# Patient Record
Sex: Female | Born: 1935 | Race: White | Hispanic: No | Marital: Married | Smoking: Never smoker
Health system: Southern US, Community
[De-identification: ages and names within clinical notes are randomized; demographics above are authoritative.]

## PROBLEM LIST (undated history)

## (undated) DIAGNOSIS — I701 Atherosclerosis of renal artery: Secondary | ICD-10-CM

## (undated) DIAGNOSIS — I739 Peripheral vascular disease, unspecified: Secondary | ICD-10-CM

## (undated) DIAGNOSIS — I4891 Unspecified atrial fibrillation: Secondary | ICD-10-CM

## (undated) DIAGNOSIS — C50919 Malignant neoplasm of unspecified site of unspecified female breast: Secondary | ICD-10-CM

## (undated) DIAGNOSIS — I10 Essential (primary) hypertension: Secondary | ICD-10-CM

## (undated) DIAGNOSIS — I499 Cardiac arrhythmia, unspecified: Secondary | ICD-10-CM

## (undated) DIAGNOSIS — F419 Anxiety disorder, unspecified: Secondary | ICD-10-CM

## (undated) DIAGNOSIS — C210 Malignant neoplasm of anus, unspecified: Secondary | ICD-10-CM

## (undated) DIAGNOSIS — M1990 Unspecified osteoarthritis, unspecified site: Secondary | ICD-10-CM

## (undated) DIAGNOSIS — E785 Hyperlipidemia, unspecified: Secondary | ICD-10-CM

## (undated) DIAGNOSIS — I639 Cerebral infarction, unspecified: Secondary | ICD-10-CM

## (undated) DIAGNOSIS — F329 Major depressive disorder, single episode, unspecified: Secondary | ICD-10-CM

## (undated) DIAGNOSIS — F32A Depression, unspecified: Secondary | ICD-10-CM

## (undated) HISTORY — DX: Atherosclerosis of renal artery: I70.1

## (undated) HISTORY — DX: Peripheral vascular disease, unspecified: I73.9

## (undated) HISTORY — DX: Hyperlipidemia, unspecified: E78.5

## (undated) HISTORY — PX: TUBAL LIGATION: SHX77

## (undated) HISTORY — PX: OVARIAN CYST REMOVAL: SHX89

## (undated) HISTORY — PX: GYNECOLOGIC CRYOSURGERY: SHX857

---

## 1987-07-10 HISTORY — PX: BREAST LUMPECTOMY: SHX2

## 1997-11-07 ENCOUNTER — Emergency Department (HOSPITAL_COMMUNITY): Admission: EM | Admit: 1997-11-07 | Discharge: 1997-11-07 | Payer: Self-pay | Admitting: Emergency Medicine

## 1997-11-10 ENCOUNTER — Ambulatory Visit (HOSPITAL_COMMUNITY): Admission: RE | Admit: 1997-11-10 | Discharge: 1997-11-10 | Payer: Self-pay | Admitting: Cardiology

## 1998-07-26 ENCOUNTER — Encounter: Payer: Self-pay | Admitting: Obstetrics & Gynecology

## 1998-07-26 ENCOUNTER — Ambulatory Visit (HOSPITAL_COMMUNITY): Admission: RE | Admit: 1998-07-26 | Discharge: 1998-07-26 | Payer: Self-pay | Admitting: Obstetrics & Gynecology

## 1998-08-09 ENCOUNTER — Ambulatory Visit (HOSPITAL_COMMUNITY): Admission: RE | Admit: 1998-08-09 | Discharge: 1998-08-09 | Payer: Self-pay | Admitting: Gastroenterology

## 1998-08-23 ENCOUNTER — Observation Stay (HOSPITAL_COMMUNITY): Admission: RE | Admit: 1998-08-23 | Discharge: 1998-08-24 | Payer: Self-pay | Admitting: Obstetrics & Gynecology

## 1999-07-27 ENCOUNTER — Encounter: Payer: Self-pay | Admitting: Obstetrics & Gynecology

## 1999-07-28 ENCOUNTER — Ambulatory Visit (HOSPITAL_COMMUNITY): Admission: RE | Admit: 1999-07-28 | Discharge: 1999-07-28 | Payer: Self-pay | Admitting: Obstetrics & Gynecology

## 1999-10-11 ENCOUNTER — Encounter: Payer: Self-pay | Admitting: Neurology

## 1999-10-11 ENCOUNTER — Ambulatory Visit (HOSPITAL_COMMUNITY): Admission: RE | Admit: 1999-10-11 | Discharge: 1999-10-11 | Payer: Self-pay | Admitting: Neurology

## 1999-10-31 ENCOUNTER — Encounter: Payer: Self-pay | Admitting: *Deleted

## 1999-10-31 ENCOUNTER — Ambulatory Visit (HOSPITAL_COMMUNITY): Admission: RE | Admit: 1999-10-31 | Discharge: 1999-10-31 | Payer: Self-pay | Admitting: *Deleted

## 1999-11-02 ENCOUNTER — Other Ambulatory Visit: Admission: RE | Admit: 1999-11-02 | Discharge: 1999-11-02 | Payer: Self-pay | Admitting: Obstetrics & Gynecology

## 1999-11-13 ENCOUNTER — Encounter: Admission: RE | Admit: 1999-11-13 | Discharge: 1999-12-11 | Payer: Self-pay | Admitting: *Deleted

## 2000-08-27 ENCOUNTER — Encounter: Payer: Self-pay | Admitting: Obstetrics & Gynecology

## 2000-08-27 ENCOUNTER — Ambulatory Visit (HOSPITAL_COMMUNITY): Admission: RE | Admit: 2000-08-27 | Discharge: 2000-08-27 | Payer: Self-pay | Admitting: Obstetrics & Gynecology

## 2000-10-23 ENCOUNTER — Other Ambulatory Visit: Admission: RE | Admit: 2000-10-23 | Discharge: 2000-10-23 | Payer: Self-pay | Admitting: Obstetrics & Gynecology

## 2001-02-15 ENCOUNTER — Emergency Department (HOSPITAL_COMMUNITY): Admission: EM | Admit: 2001-02-15 | Discharge: 2001-02-15 | Payer: Self-pay | Admitting: Emergency Medicine

## 2001-03-16 ENCOUNTER — Encounter: Payer: Self-pay | Admitting: Neurology

## 2001-03-16 ENCOUNTER — Ambulatory Visit (HOSPITAL_COMMUNITY): Admission: RE | Admit: 2001-03-16 | Discharge: 2001-03-16 | Payer: Self-pay | Admitting: Neurology

## 2001-11-05 ENCOUNTER — Other Ambulatory Visit: Admission: RE | Admit: 2001-11-05 | Discharge: 2001-11-05 | Payer: Self-pay | Admitting: Obstetrics & Gynecology

## 2001-11-10 ENCOUNTER — Ambulatory Visit (HOSPITAL_COMMUNITY): Admission: RE | Admit: 2001-11-10 | Discharge: 2001-11-10 | Payer: Self-pay | Admitting: Obstetrics & Gynecology

## 2001-11-10 ENCOUNTER — Encounter: Payer: Self-pay | Admitting: Obstetrics & Gynecology

## 2002-10-07 ENCOUNTER — Encounter: Payer: Self-pay | Admitting: Internal Medicine

## 2002-10-07 ENCOUNTER — Encounter: Admission: RE | Admit: 2002-10-07 | Discharge: 2002-10-07 | Payer: Self-pay | Admitting: Internal Medicine

## 2002-12-03 ENCOUNTER — Other Ambulatory Visit: Admission: RE | Admit: 2002-12-03 | Discharge: 2002-12-03 | Payer: Self-pay | Admitting: Obstetrics & Gynecology

## 2003-01-07 ENCOUNTER — Encounter: Payer: Self-pay | Admitting: Obstetrics & Gynecology

## 2003-01-07 ENCOUNTER — Ambulatory Visit (HOSPITAL_COMMUNITY): Admission: RE | Admit: 2003-01-07 | Discharge: 2003-01-07 | Payer: Self-pay | Admitting: Obstetrics & Gynecology

## 2003-01-14 ENCOUNTER — Encounter (INDEPENDENT_AMBULATORY_CARE_PROVIDER_SITE_OTHER): Payer: Self-pay | Admitting: Specialist

## 2003-01-14 ENCOUNTER — Encounter: Payer: Self-pay | Admitting: Obstetrics & Gynecology

## 2003-01-14 ENCOUNTER — Encounter: Admission: RE | Admit: 2003-01-14 | Discharge: 2003-01-14 | Payer: Self-pay | Admitting: Obstetrics & Gynecology

## 2003-01-14 HISTORY — PX: BREAST BIOPSY: SHX20

## 2003-03-16 ENCOUNTER — Encounter (INDEPENDENT_AMBULATORY_CARE_PROVIDER_SITE_OTHER): Payer: Self-pay

## 2003-03-16 ENCOUNTER — Ambulatory Visit (HOSPITAL_COMMUNITY): Admission: RE | Admit: 2003-03-16 | Discharge: 2003-03-16 | Payer: Self-pay | Admitting: Obstetrics & Gynecology

## 2003-05-30 ENCOUNTER — Ambulatory Visit (HOSPITAL_COMMUNITY): Admission: RE | Admit: 2003-05-30 | Discharge: 2003-05-30 | Payer: Self-pay | Admitting: Internal Medicine

## 2003-05-31 ENCOUNTER — Ambulatory Visit (HOSPITAL_COMMUNITY): Admission: RE | Admit: 2003-05-31 | Discharge: 2003-05-31 | Payer: Self-pay | Admitting: Internal Medicine

## 2003-07-26 ENCOUNTER — Inpatient Hospital Stay (HOSPITAL_COMMUNITY): Admission: EM | Admit: 2003-07-26 | Discharge: 2003-07-27 | Payer: Self-pay | Admitting: Emergency Medicine

## 2003-07-27 HISTORY — PX: CARDIOVASCULAR STRESS TEST: SHX262

## 2003-11-19 ENCOUNTER — Emergency Department (HOSPITAL_COMMUNITY): Admission: EM | Admit: 2003-11-19 | Discharge: 2003-11-20 | Payer: Self-pay | Admitting: Emergency Medicine

## 2004-01-31 ENCOUNTER — Encounter: Admission: RE | Admit: 2004-01-31 | Discharge: 2004-01-31 | Payer: Self-pay | Admitting: Obstetrics & Gynecology

## 2004-03-20 ENCOUNTER — Ambulatory Visit (HOSPITAL_COMMUNITY): Admission: RE | Admit: 2004-03-20 | Discharge: 2004-03-20 | Payer: Self-pay | Admitting: Internal Medicine

## 2004-07-27 ENCOUNTER — Emergency Department (HOSPITAL_COMMUNITY): Admission: EM | Admit: 2004-07-27 | Discharge: 2004-07-27 | Payer: Self-pay | Admitting: Family Medicine

## 2004-08-21 ENCOUNTER — Encounter (INDEPENDENT_AMBULATORY_CARE_PROVIDER_SITE_OTHER): Payer: Self-pay | Admitting: Specialist

## 2004-08-21 ENCOUNTER — Ambulatory Visit (HOSPITAL_COMMUNITY): Admission: RE | Admit: 2004-08-21 | Discharge: 2004-08-21 | Payer: Self-pay | Admitting: Gastroenterology

## 2004-10-16 ENCOUNTER — Encounter: Admission: RE | Admit: 2004-10-16 | Discharge: 2004-10-16 | Payer: Self-pay | Admitting: Occupational Medicine

## 2004-12-12 ENCOUNTER — Encounter: Admission: RE | Admit: 2004-12-12 | Discharge: 2005-01-04 | Payer: Self-pay | Admitting: Orthopedic Surgery

## 2005-01-03 ENCOUNTER — Other Ambulatory Visit: Admission: RE | Admit: 2005-01-03 | Discharge: 2005-01-03 | Payer: Self-pay | Admitting: Obstetrics & Gynecology

## 2005-02-08 ENCOUNTER — Ambulatory Visit (HOSPITAL_COMMUNITY): Admission: RE | Admit: 2005-02-08 | Discharge: 2005-02-08 | Payer: Self-pay | Admitting: Obstetrics & Gynecology

## 2005-09-20 ENCOUNTER — Ambulatory Visit (HOSPITAL_COMMUNITY): Admission: RE | Admit: 2005-09-20 | Discharge: 2005-09-20 | Payer: Self-pay | Admitting: Internal Medicine

## 2005-10-01 ENCOUNTER — Ambulatory Visit (HOSPITAL_COMMUNITY): Admission: RE | Admit: 2005-10-01 | Discharge: 2005-10-01 | Payer: Self-pay | Admitting: Internal Medicine

## 2005-11-20 ENCOUNTER — Inpatient Hospital Stay (HOSPITAL_COMMUNITY): Admission: EM | Admit: 2005-11-20 | Discharge: 2005-11-26 | Payer: Self-pay | Admitting: Emergency Medicine

## 2005-11-20 ENCOUNTER — Ambulatory Visit: Payer: Self-pay | Admitting: Internal Medicine

## 2005-11-22 ENCOUNTER — Encounter: Payer: Self-pay | Admitting: Internal Medicine

## 2005-11-24 ENCOUNTER — Encounter: Payer: Self-pay | Admitting: Cardiology

## 2005-12-04 ENCOUNTER — Encounter: Admission: RE | Admit: 2005-12-04 | Discharge: 2006-01-14 | Payer: Self-pay | Admitting: Internal Medicine

## 2006-02-22 ENCOUNTER — Encounter: Admission: RE | Admit: 2006-02-22 | Discharge: 2006-02-22 | Payer: Self-pay | Admitting: Obstetrics & Gynecology

## 2006-02-28 ENCOUNTER — Encounter: Admission: RE | Admit: 2006-02-28 | Discharge: 2006-02-28 | Payer: Self-pay | Admitting: Obstetrics & Gynecology

## 2006-03-22 HISTORY — PX: TRANSTHORACIC ECHOCARDIOGRAM: SHX275

## 2006-04-09 ENCOUNTER — Ambulatory Visit (HOSPITAL_COMMUNITY): Admission: RE | Admit: 2006-04-09 | Discharge: 2006-04-09 | Payer: Self-pay | Admitting: Internal Medicine

## 2007-02-25 ENCOUNTER — Encounter: Admission: RE | Admit: 2007-02-25 | Discharge: 2007-02-25 | Payer: Self-pay | Admitting: Obstetrics & Gynecology

## 2007-06-18 ENCOUNTER — Ambulatory Visit (HOSPITAL_COMMUNITY): Admission: RE | Admit: 2007-06-18 | Discharge: 2007-06-18 | Payer: Self-pay | Admitting: Internal Medicine

## 2008-05-18 ENCOUNTER — Encounter: Admission: RE | Admit: 2008-05-18 | Discharge: 2008-05-18 | Payer: Self-pay | Admitting: Obstetrics & Gynecology

## 2008-07-09 DIAGNOSIS — I499 Cardiac arrhythmia, unspecified: Secondary | ICD-10-CM

## 2008-07-09 HISTORY — DX: Cardiac arrhythmia, unspecified: I49.9

## 2009-03-14 ENCOUNTER — Emergency Department (HOSPITAL_COMMUNITY): Admission: EM | Admit: 2009-03-14 | Discharge: 2009-03-14 | Payer: Self-pay | Admitting: Emergency Medicine

## 2009-03-21 DIAGNOSIS — I6340 Cerebral infarction due to embolism of unspecified cerebral artery: Secondary | ICD-10-CM | POA: Insufficient documentation

## 2009-03-21 DIAGNOSIS — E785 Hyperlipidemia, unspecified: Secondary | ICD-10-CM | POA: Insufficient documentation

## 2009-03-21 DIAGNOSIS — IMO0002 Reserved for concepts with insufficient information to code with codable children: Secondary | ICD-10-CM | POA: Insufficient documentation

## 2009-03-21 DIAGNOSIS — D649 Anemia, unspecified: Secondary | ICD-10-CM | POA: Insufficient documentation

## 2009-03-21 DIAGNOSIS — R079 Chest pain, unspecified: Secondary | ICD-10-CM

## 2009-03-21 HISTORY — DX: Cerebral infarction due to embolism of unspecified cerebral artery: I63.40

## 2009-03-22 ENCOUNTER — Encounter: Payer: Self-pay | Admitting: Cardiology

## 2009-03-23 ENCOUNTER — Encounter: Payer: Self-pay | Admitting: Cardiology

## 2009-03-24 ENCOUNTER — Ambulatory Visit: Payer: Self-pay | Admitting: Cardiology

## 2009-05-24 ENCOUNTER — Encounter: Payer: Self-pay | Admitting: Cardiology

## 2009-05-25 ENCOUNTER — Ambulatory Visit: Payer: Self-pay | Admitting: Cardiology

## 2009-05-25 DIAGNOSIS — R0602 Shortness of breath: Secondary | ICD-10-CM | POA: Insufficient documentation

## 2009-06-15 ENCOUNTER — Ambulatory Visit: Payer: Self-pay | Admitting: Cardiology

## 2009-06-15 ENCOUNTER — Ambulatory Visit: Payer: Self-pay

## 2009-06-20 ENCOUNTER — Encounter: Payer: Self-pay | Admitting: Cardiology

## 2009-06-22 ENCOUNTER — Ambulatory Visit: Payer: Self-pay | Admitting: Cardiology

## 2009-06-22 DIAGNOSIS — R002 Palpitations: Secondary | ICD-10-CM | POA: Insufficient documentation

## 2009-06-22 DIAGNOSIS — R9431 Abnormal electrocardiogram [ECG] [EKG]: Secondary | ICD-10-CM | POA: Insufficient documentation

## 2009-07-14 ENCOUNTER — Telehealth (INDEPENDENT_AMBULATORY_CARE_PROVIDER_SITE_OTHER): Payer: Self-pay | Admitting: *Deleted

## 2009-07-18 ENCOUNTER — Ambulatory Visit: Payer: Self-pay | Admitting: Cardiology

## 2009-07-18 ENCOUNTER — Ambulatory Visit: Payer: Self-pay

## 2009-07-18 ENCOUNTER — Encounter (HOSPITAL_COMMUNITY): Admission: RE | Admit: 2009-07-18 | Discharge: 2009-09-12 | Payer: Self-pay | Admitting: Cardiology

## 2009-09-21 ENCOUNTER — Emergency Department (HOSPITAL_COMMUNITY): Admission: EM | Admit: 2009-09-21 | Discharge: 2009-09-21 | Payer: Self-pay | Admitting: Emergency Medicine

## 2009-09-30 ENCOUNTER — Ambulatory Visit (HOSPITAL_COMMUNITY): Admission: RE | Admit: 2009-09-30 | Discharge: 2009-09-30 | Payer: Self-pay | Admitting: Internal Medicine

## 2009-12-02 ENCOUNTER — Encounter: Admission: RE | Admit: 2009-12-02 | Discharge: 2009-12-02 | Payer: Self-pay | Admitting: Internal Medicine

## 2010-07-30 ENCOUNTER — Encounter: Payer: Self-pay | Admitting: Obstetrics & Gynecology

## 2010-07-30 ENCOUNTER — Encounter: Payer: Self-pay | Admitting: Internal Medicine

## 2010-08-10 NOTE — Assessment & Plan Note (Signed)
Summary: Cardiology Nuclear Study  Nuclear Med Background Indications for Stress Test: Evaluation for Ischemia  Indications Comments: Abnormal GXT  History: GXT, History of Chemo, Myocardial Perfusion Study  History Comments: 12/10 IHK:[id number];'87 MPS:OK per patient; h/o PAF, costochrontits  Symptoms: Chest Pain, Chest Tightness, DOE, Palpitations, SOB  Symptoms Comments: Last episode of FI:[id number] with palpitations.   Nuclear Pre-Procedure Cardiac Risk Factors: CVA, Hypertension, Lipids Caffeine/Decaff Intake: None NPO After: 8:30 PM Lungs: Clear IV 0.9% NS with Angio Cath: 20g     IV Site: (L) AC IV Started by: Stanton Kidney EMT-P Chest Size (in) 38     Cup Size D     Height (in): 64 Weight (lb): 158 BMI: 27.22 Tech Comments: Toprol held > 24hours, Per Pt.  Nuclear Med Study 1 or 2 day study:  1 day     Stress Test Type:  Stress Reading MD:  Olga Millers, MD     Referring MD:  Rollene Rotunda, MD Resting Radionuclide:  Technetium 22m Tetrofosmin     Resting Radionuclide Dose:  10.6 mCi  Stress Radionuclide:  Technetium 44m Tetrofosmin     Stress Radionuclide Dose:  33.0 mCi   Stress Protocol Exercise Time (min):  7:15 min     Max HR:  129 bpm     Predicted Max HR:  148 bpm  Max Systolic BP: 172 mm Hg     Percent Max HR:  87.16 %     METS: 8.9 Rate Pressure Product:  41660    Stress Test Technologist:  [employer]a [employer] CMA-N     Nuclear Technologist:  Burna Mortimer Deal RT-N  Rest Procedure  Myocardial perfusion imaging was performed at rest 45 minutes following the intravenous administration of Myoview Technetium 27m Tetrofosmin.  Stress Procedure  The patient exercised for 7:15.  The patient stopped due to fatigue and denied any chest pain.  There were nonspecific ST-T wave changes and occasional PAC's with a rare PVC.  Myoview was injected at peak exercise and myocardial perfusion imaging was performed after a brief delay.  QPS Raw Data Images:  Acuisition technically good;  normal left ventricular size. Stress Images:  There is normal uptake in all areas. Rest Images:  Normal homogeneous uptake in all areas of the myocardium. Subtraction (SDS):  No evidence of ischemia. Transient Ischemic Dilatation:  1.03  (Normal <1.22)  Lung/Heart Ratio:  .26  (Normal <0.45)  Quantitative Gated Spect Images QGS EDV:  61 ml QGS ESV:  12 ml QGS EF:  80 % QGS cine images:  Normal wall motion.   Overall Impression  Exercise Capacity: Fair exercise capacity. BP Response: Normal blood pressure response. Clinical Symptoms: There is dyspnea. ECG Impression: No diagnostic  ST segment change suggestive of ischemia. Overall Impression: There is no sign of scar or ischemia.  Appended Document: Cardiology Nuclear Study Normal.  No further evaluation needed.  Appended Document: Cardiology Nuclear Study left message of normal results andthat no further testing needed at this time.

## 2010-08-10 NOTE — Progress Notes (Signed)
Summary: Nuclear Pre-Procedure  Phone Note Outgoing Call Call back at [facility] Phone [phone]   Call placed by: Stanton Kidney, EMT-P,  July 14, 2009 1:30 PM Call placed to: Patient Action Taken: Phone Call Completed Summary of Call: Reviewed information on Myoview Information Sheet (see scanned document for further details).  Spoke with Patient.    Nuclear Med Background Indications for Stress Test: Evaluation for Ischemia, Stent Patency, Abnormal EKG   History: GXT, History of Chemo, Myocardial Perfusion Study  History Comments: GXT: Abnormal '04 MPS  Symptoms: Chest Pain, DOE, Palpitations    Nuclear Pre-Procedure Cardiac Risk Factors: CVA, Hypertension, Lipids Height (in): 64

## 2010-09-20 ENCOUNTER — Other Ambulatory Visit: Payer: Self-pay | Admitting: Gastroenterology

## 2010-10-13 LAB — APTT: aPTT: 37 seconds (ref 24–37)

## 2010-10-13 LAB — COMPREHENSIVE METABOLIC PANEL
ALT: 15 U/L (ref 0–35)
AST: 25 U/L (ref 0–37)
BUN: 5 mg/dL — ABNORMAL LOW (ref 6–23)
Calcium: 9.5 mg/dL (ref 8.4–10.5)
Chloride: 101 mEq/L (ref 96–112)
GFR calc Af Amer: >60 mL/min (ref >60)
GFR calc non Af Amer: >60 mL/min (ref >60)
Sodium: 141 mEq/L (ref 135–145)

## 2010-10-13 LAB — DIFFERENTIAL
Basophils Absolute: 0 10*3/uL (ref 0.0–0.1)
Basophils Relative: 0 % (ref 0–1)
Eosinophils Absolute: 0.1 10*3/uL (ref 0.0–0.7)
Eosinophils Relative: 2 % (ref 0–5)
Monocytes Absolute: 0.6 10*3/uL (ref 0.1–1.0)
Neutrophils Relative %: 71 % (ref 43–77)

## 2010-10-13 LAB — URINE CULTURE: Colony Count: >=100000

## 2010-10-13 LAB — POCT CARDIAC MARKERS
CKMB, poc: <1 ng/mL — ABNORMAL LOW (ref 1.0–8.0)
CKMB, poc: <1 ng/mL — ABNORMAL LOW (ref 1.0–8.0)
Myoglobin, poc: 71.2 ng/mL (ref 12–200)
Myoglobin, poc: 94.5 ng/mL (ref 12–200)
Troponin i, poc: <0.05 ng/mL (ref 0.00–0.09)

## 2010-10-13 LAB — URINE MICROSCOPIC-ADD ON

## 2010-10-13 LAB — CBC
HCT: 35.5 % — ABNORMAL LOW (ref 36.0–46.0)
MCHC: 32.6 g/dL (ref 30.0–36.0)
MCV: 86.1 fL (ref 78.0–100.0)
RBC: 4.12 MIL/uL (ref 3.87–5.11)
RDW: 17.4 % — ABNORMAL HIGH (ref 11.5–15.5)

## 2010-10-13 LAB — PROTIME-INR: Prothrombin Time: 24.1 seconds — ABNORMAL HIGH (ref 11.6–15.2)

## 2010-10-13 LAB — URINALYSIS, ROUTINE W REFLEX MICROSCOPIC: Ketones, ur: NEGATIVE mg/dL

## 2010-11-24 NOTE — H&P (Signed)
NAMEMALLY, GAVINA NO.:  [id number]   MEDICAL RECORD NO.:  [id number]          PATIENT TYPE:  INP   LOCATION:  1847                         FACILITY:  [facility]   PHYSICIAN:  Geoffry Paradise, M.D.  DATE OF BIRTH:  01/29/1935   DATE OF ADMISSION:  11/20/2005  DATE OF DISCHARGE:                                HISTORY & PHYSICAL   CHIEF COMPLAINT:  Headache and speech difficulties.   HISTORY OF PRESENT ILLNESS:  Ms. Clucas is a 76 year old well known to  [facility] with history that includes chronic headaches, followed by  Dr. Sandria Manly for approximately six years, hypertension, remote breast and rectal  carcinoma and a remote history of paroxysmal atrial fibrillation, presenting  at this time with intractable headache and speech difficulties.  The patient  has been in excellent health, having had a physical by Dr. Waynard Edwards within the  last 4-8 weeks.  She is followed regularly by Dr. Sandria Manly at least yearly for  her chronic headaches, maintained on Lexapro and Neurontin.  She relates  that at approximately 9:30 a.m. she had the sudden onset of a headache which  is mainly frontal and facial but subsequent development of difficulty with  word finding.  They have evidently contacted the office and was sent to the  emergency room; I was contacted at approximately 8 p.m. for further  management.  I have encouraged the EDP to also call neurology for consult  but have commanded to admit the patient for possible stroke.  She  specifically denies any nausea, vomiting, fever or difficulty with  swallowing or ambulation.  She has had no seizure activity or incontinence.  She has not been ill in any way.  In the emergency room, she remains with  the headache improved with morphine and continued difficulty with speech.  The husband says it is a bit better.  She has had no chest pain or shortness  of breath.  She is quite accustomed to in times past when atrial  fibrillation has  flared but she relates nothing in the recent year.   PAST MEDICAL HISTORY:  Patient has no known drug allergies.   MEDICATIONS:  1.  Cozaar 50 mg daily.  2.  Hydrochlorothiazide 25 mg daily.  3.  Lexapro 10 mg daily.  4.  Neurontin 300 q.a.m.  5.  Toprol XL 25 q.a.m.  6.  Ultram 50 as needed for pain.   MEDICAL ILLNESSES:  Hypertension for approximately one year, remote history  of breast carcinoma and rectal carcinoma both 20 years ago, paroxysmal  atrial fibrillation again none recently.   SURGICAL ILLNESSES:  Colonoscopy 2004, breast and rectal surgery 20 years  ago, Cardiolite study in 2004 and minor female surgeries.   SOCIAL HISTORY:  The patient is a retired Engineer, civil (consulting).  Does not smoke or drink.  Husband is at the bedside.   PHYSICAL EXAM:  VITAL SIGNS:  Temperature 98.4, blood pressure is 118/59,  pulse 58 regular, respiratory rate 18, O2 saturation 93%.  GENERAL:  The patient is actually pleasant, supine and in no distress.  Good  facial symmetry.  Skin: Warm, nondiaphoretic, good turgor.  HEENT:  Normocephalic, atraumatic individual, anicteric.  Pupils round,  reactive to light and accommodation.  Visual fields intact.  Extraocular  movements intact.  Tongue and uvula midline.  Oropharynx benign.  No facial  droop.  NECK:  No JVD or bruits.  Supple, nontender.  No meningismus.  LUNGS:  Clear bilaterally.  CARDIOVASCULAR:  Regular rate and rhythm.  No murmur, gallop, rub or heave.  No S3 or S4.  ABDOMEN:  Soft, nontender.  No hepatosplenomegaly.  BACK:  Without CVA or spinal tenderness.  EXTREMITIES:  No cyanosis, clubbing or edema.  Calves soft and nontender.  Intact distal pulses.  Warm distally.  NEUROLOGICALLY:  She has intact higher cortical functioning.  Cranial nerves  II-XII were intact.  She does have some difficulty with a mild expressive  aphasia and word-finding but comprehensive is able to follow commands quite  well.  Motor is 5/5.  Toes downgoing.  Deep  tendon reflexes symmetric.  Sensation symmetric as well.   CT:  Possible left parietal metastatic disease versus stroke unclear.  Certainly no edema or midline shift and certainly no bleed.   ASSESSMENT:  1.  New onset of atypical headache with speech difficulties consistent with      either stroke or possibly small metastasis, unclear.  No evidence of      meningitis or bleed.  2.  Remote breast carcinoma and rectal carcinoma.  3.  Essential hypertension.  4.  Paroxysmal atrial fibrillation.  No recent exacerbation by history.   PLAN:  The patient will be hydrated, treated with aspirin.  MRI first thing  in the morning and further pending this evaluation.  This will assess in  discerning as to whether this is as a result of a little stroke versus  metastatic disease.  Further to include cardiac workup or vascular workup  pending these findings.           ______________________________  Geoffry Paradise, M.D.     RA/MEDQ  D:  11/20/2005  T:  11/20/2005  Job:  [id number]

## 2010-11-24 NOTE — Discharge Summary (Signed)
NAMECARLISA, [name]               ACCOUNT NO.:  [id number]   MEDICAL RECORD NO.:  [id number]          PATIENT TYPE:  INP   LOCATION:  6704                         FACILITY:  [facility]   PHYSICIAN:  Mark A. Perini, M.D.   DATE OF BIRTH:  1935/01/20   DATE OF ADMISSION:  11/20/2005  DATE OF DISCHARGE:  11/26/2005                                 DISCHARGE SUMMARY   DISCHARGE DIAGNOSES:  1.  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  2.  Thrombosis noted in the superior portion of the inferior vena cava and      the proximal area of the right atrium which could have possibly      contributed to her stroke.  She did have a positive bubble study noted      on transesophageal echocardiogram, but this was a late positive finding      with no definite PFO noted.  3.  Hypertension controlled currently.  4.  Anemia.  5.  Sinusitis noted on MRI this admission.  6.  Vitamin B12 deficiency new diagnosis this admission.  7.  Dyslipidemia.  8.  Remote history of paroxysmal atrial fibrillation in 1999 with no      definite recurrence noted at this time.   PROCEDURES:  1.  Stroke team consultation with Dr. Pearlean Brownie.  2.  Cardiology consultation.  3.  CT scan of the head on Nov 21, 2003, which showed a wedge-shape      peripheral hypodensity high in the left parietal lobe worrisome for      acute stroke.  4.  MRI and MRA of the brain which showed a left MCA parietal branch acute      infarct.  MRA of the brain showed no significant stenosis.  Extracranial      MRA showed no significant stenosis.  5.  Speech therapy, occupational and physical therapy consultations.  6.  Transesophageal echocardiogram showed normal LV function with ejection      fraction of 60%, trivial mitral regurgitation with no other valvular      disease.  Her interatrial septum was intact with no evidence of PFO or      ASD, however, there was a mobile mass in the IVC right  atrial junction      which appeared to be a clot, bubble study was initially negative but      then turned positive after five to six cycles, suggesting a possible AVM      with a right-to-left shunt in the pulmonary vasculature.  7.  TCD bubble study on Nov 23, 2005, which showed no evidence of right-to-      left shunting.  8.  Furthermore, the patient had a CT of the chest in March of 2007 which      showed no AVM in the pulmonary vasculature.   DISCHARGE MEDICATIONS:  1.  She is to resume calcium supplementation.  2.  Neurontin 100 mg 1-3 tablets daily as before.  3.  Lexapro 10 mg daily.  4.  Tylenol as needed.  5.  She is to avoid NSAIDs  6.  Ultram as needed.  7.  Toprol XL 100 mg 1/2 tablet daily.  8.  Cozaar 100 mg 1 tablet daily.  9.  Xanax 0.5 mg 1/2 tablet daily as needed.  10. Magnesium oxide 400 mg once a day.  11. Zocor 40 mg 1/2 tablet each evening.  12. Nasacort AQ 2 sprays each nostril daily.  13. Protonix 40 mg once daily with food.  14. She is to discontinue hydrochlorothiazide unless told otherwise.  15. Aspirin 81 mg daily.  16. Coumadin 5 mg 1 tablet each evening except for 1/2 pill each Wednesday.   HISTORY OF PRESENT ILLNESS:  Jeanette [name] is a 76 year old Caucasian female who  developed sudden onset of expressive language difficulties on the morning of  admission.  She woke up with a severe headache.  She does have a history of  chronic headaches on and off the last 6 years and has been under the care of  Dr. Sandria Manly for this.  She has also seen Dr. Meryl Crutch for this as well.  She was  noted to be struggling with words and trouble expressing herself and getting  her words out, although she could understand quite well.  She did not have  any slurred speech or focal extremity weakness, numbness, gait or balance  problems.  She has had no previous history of stroke, TIA or seizures.  In  the emergency room, she was found to have an abnormality on her CT scan   consistent with a left middle cerebral artery branch infarction.  She was  therefore admitted.   HOSPITAL COURSE:  Jeanette [name] was admitted to a telemetry bed.  She was treated  with intravenous heparin per stroke protocol.  MRI and MRA of the brain were  performed with results as noted above.  The patient's symptoms gradually  improved over the upcoming days, although she still had some residual  expressive dysfunction.  She developed some mild anemia, but there is no  evidence of acute bleeding.  She was found to be B12 deficient and was given  a dose of intramuscular B12.  Other studies performed included a  transesophageal echocardiogram and transcranial Doppler studies with results  as noted above.  It was felt that she would need lifetime aspirin and  Coumadin therapy.  By Nov 26, 2005, her INR was therapeutic and she was  deemed stable for discharge home.  She had no fevers or purulent drainage  therefore there was no treatment for sinusitis given at this time.  Although  it should be noted that there were some sinusitis changes noted on her MRI  and this may have to be addressed at a later time.   DISCHARGE PHYSICAL EXAMINATION:  Temperature 98, afebrile, pulse 64,  respiratory 18, blood pressure 109/67, saturation 91-98% on room air.  She  is in no acute distress.  Lungs were clear to auscultation bilaterally with  no wheezes, rales or rhonchi.  Heart was regular rate and rhythm with no  murmur or gallop.  Abdomen was soft and nontender.  She moved extremities x4  and had grossly normal strength in both upper and lower extremities.   DISCHARGE LABORATORY DATA:  White count 5.3, hemoglobin 10.3, platelet count  257,000; there were 61% segs, 24% lymphocytes, 5% monocytes.  INR was 2.  Sodium was 141, potassium 3.7, chloride 107, CO2 29, BUN 7, creatinine 0.8,  glucose 95, albumin 3.1.  Hemoglobin A1c was 6.2.  Triglycerides  were 64, HDL 52, LDL was 103, total cholesterol was 155 with a  total cholesterol HDL  ratio of 3.2.  CEA was normal at 2.1.  B12 level was low at 181 mcg/mL. TSH  was normal at 2.954.  Urine culture showed multiple species.   DISCHARGE INSTRUCTIONS:  Jeanette [name] is to follow a low-salt, low-fat diet.  She  is to increase her activity slowly.  She is to call our office for follow-up  visit in 4 days.  At that time she will have her PT/INR rechecked.  She is  to follow-up with Dr. Pearlean Brownie and Dr. Gala Romney as instructed.  She is also to  follow-up with speech therapy to continue  with speech therapy for her expressive dysphasia.  She is to call for any  recurrent problems.  It should be noted the patient has had CT scans of the  abdomen, pelvis and chest within the last several months showing no evidence  of pulmonary problems or renal tumor as sometimes renal cell carcinoma can  relate to an IVC clot.           ______________________________  Redge Gainer Waynard Edwards, M.D.     MAP/MEDQ  D:  11/29/2005  T:  11/30/2005  Job:  [id number]   cc:   Genene Churn. Love, M.D.  Fax: [phone]   Arvilla Meres, M.D. [facility]  [employer]  [address]  [place]  [place] [address]

## 2010-11-24 NOTE — Consult Note (Signed)
NAMECHANDI, NICKLIN               ACCOUNT NO.:  [id number]   MEDICAL RECORD NO.:  [id number]          PATIENT TYPE:  INP   LOCATION:  6704                         FACILITY:  [facility]   PHYSICIAN:  Pramod P. Pearlean Brownie, MD    DATE OF BIRTH:  [birth year]   DATE OF CONSULTATION:  11/23/2005  DATE OF DISCHARGE:                                   CONSULTATION   CLINICAL HISTORY:  Ms. Adcox is a 71-hour lady who had a left middle  cerebral artery branch infarction Nov 20, 2005.  MRI showed left frontal and  parietal MCA branch infarct.  No obvious etiology has been found except  transesophageal echocardiogram has shown a small clot in the right atrium.  TEE bubble study was, however, negative.  Transcranial Doppler bubble study  would be appropriate to evaluate for small PFO or a pulmonary AV fistula as  the possible mechanism for paradoxical embolism.   Past medical history significant for paroxysmal atrial fibrillation,  headache, depression, hypertension, hyperlipidemia.   HOME MEDICATIONS:  Lexapro, Neurontin, Toprol, Cozaar, Vicodin.   ALLERGIES TO MEDICATIONS:  None known.   PAST SURGICAL HISTORY:  None.   SOCIAL HISTORY:  Married, lives with her husband.  She does not smoke or  drink.   PHYSICAL EXAMINATION:  GENERAL:  Pleasant Caucasian lady who is not in distress.  VITAL SIGNS:  She is afebrile, pulse rate is 71 and regular, respiratory  rate 16 per minute, blood pressure 142/71, temperature 97.9,  100% on room  air.  NEUROLOGICAL EXAM:  The patient is pleasant, awake, alert, cooperative.  There is no aphasia.  Pupils are regular and reactive to full range without  nystagmus.  Face with symmetric bilateral movements and normal.  Tongue is  midline.  Motor:  The patient has mild expressive language difficulties.  She has thickened speech but can name and repeat and comprehend quite well.  She has no visual field deficits on testing.  The patient states with the  left eye covered  when she looks at me from the right her vision is not quite  clear and she has trouble with some depth perception.  The left eye she is  able to see much better.  Motor system exam reveals symmetric strength.  She  had a steady gait.   Data reviewed as stated above.   IMPRESSION:  71-year lady with left middle cerebral artery branch infarction  of embolic etiology.  Transesophageal echocardiogram shows a small right  atrial clot but no definite patent foramen ovale.  Transcranial Doppler  study would be appropriate to look for a small right to left cardiac shunt  not seen on TEE or intrapulmonary shunt.   After taking verbal informed consent from the patient and explaining the  risks and benefits, transcranial Doppler bubble study was performed at the  bedside.  the left middle cerebral artery was insinuated using the head set  and continuously monitored during the study.  IV line was inserted  previously in the right forearm by the nurse.  Agitated saline injection at  rest resulted in no high intensity  transient signals.  Agitated saline  injection was followed by Valsalva maneuver x2 and both times no transient  signals were noted.   IMPRESSION:  Negative transcranial Doppler bubble study, no evidence of  right to left intracardiac or intrapulmonary shunt by today's study.  The  results were explained to the patient's family.  Angiogram of the chest had  previously been ordered, I would cancel that study since the patient did  have a CT scan of the lungs with and without contrast last month which did  not show an obvious pulmonary AV fistula.  With a negative bubble study  taking the chances of that being quite low.  The patient will stay on  heparin and start Coumadin for secondary stroke prevention.  I would  recommend follow up TEE in three months time. If the right atrial clot has  resolved, the patient may come off the Coumadin at that point. I would  recommend checking  hypercoagulable panel labs.           ______________________________  Sunny Schlein. Pearlean Brownie, MD     PPS/MEDQ  D:  11/23/2005  T:  11/24/2005  Job:  [id number]   cc:   Loraine Leriche A. Perini, M.D.  Fax: [phone]

## 2010-11-24 NOTE — Consult Note (Signed)
NAME:  BERT, GIVANS                         ACCOUNT NO.:  [id number]   MEDICAL RECORD NO.:  [id number]                   PATIENT TYPE:  INP   LOCATION:  1844                                 FACILITY:  [facility]   PHYSICIAN:  Charlton Haws, M.D.                  DATE OF BIRTH:  30-Sep-1934   DATE OF CONSULTATION:  DATE OF DISCHARGE:                                   CONSULTATION   REASON FOR CONSULTATION:  Chest pain.   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  She has a history  of what sounds like PAF.  She has never seen a cardiologist.  Apparently  this has been treated in the ER and by her primary care doctor.  She has  occasional what she thinks is trigeminy or PVCs.  She has had typical chest  pain since yesterday.   The pain is not necessarily related to exertion.  It is sharp in the  anterior chest.  She also feels some pain in her sternum.  The patient had  her pain resolved with O2 in the ER.  She is currently pain-free.  She also  complains of some diffuse burning pain.   Of note is that she has had a cervical MRI back in November, which showed no  critical stenosis; however, she has been seeing Genene Churn. Love, M.D., for  problems with headache, tingling in the back of her head and down her arms.  He thinks that this may involve a migraine equivalent.   The patient otherwise has never had a stress test and does not have  documented coronary disease.   She reports being allergic to EPINEPHRINE.  Apparently she got this in some  Novocaine at the dentist, and it caused a rapid heart rate.   The patient's medications include an aspirin a day, Neurontin, Depakote.   She has had a previous lumpectomy and left breast biopsy and a previous D&C.   There is a family history for heart failure in advanced ages.   She is married with four children.  She does not smoke or drink.   Again, her primary complaints revolve around frequent occipital headache.  She does  not have any significant shortness of breath.  There is no history  of GI problems.   PHYSICAL EXAMINATION:  VITAL SIGNS:  The blood pressure is 150/80, pulse is  71 and regular.  NECK:  Carotids normal.  CHEST:  Lungs clear.  CARDIAC:  There is an S1, S2, without murmur, gallop, rub, or click.  ABDOMEN:  Benign.  EXTREMITIES:  Lower extremities with intact pulses, no edema.   EKG shows sinus rhythm that is within normal limits.  Her enzymes are  negative.  Lab work is otherwise unremarkable.   IMPRESSION:  The patient's chest pain is atypical for coronary disease.  She  has had problems with paroxysmal atrial fibrillation in  the past.  I would  stop her heparin at this time.  Unfortunately, we cannot do a Cardiolite  this morning.  She is willing to do an exercise stress Cardiolite study in  the morning.  If this is normal, I do not think further cardiac workup is  indicated.   She will continue to follow up with Dr. Sandria Manly and her primary care doctor in  terms of her headaches and paresthesias in her head.   Her primary care doctors have continued her Neurontin.   Further recommendations will be based on the results of her Cardiolite  study.                                               Charlton Haws, M.D.    PN/MEDQ  D:  07/26/2003  T:  07/26/2003  Job:  [id number]

## 2010-11-24 NOTE — Op Note (Signed)
[name], Jeanette [name]               ACCOUNT NO.:  [id number]   MEDICAL RECORD NO.:  [id number]          PATIENT TYPE:  AMB   LOCATION:  ENDO                         FACILITY:  [facility]   PHYSICIAN:  Bernette Redbird, M.D.   DATE OF BIRTH:  [birth year]   DATE OF PROCEDURE:  08/21/2004  DATE OF DISCHARGE:                                 OPERATIVE REPORT   PROCEDURE:  Colonoscopy with biopsy.   INDICATION:  History of anal cancer and desire for colon cancer screening in  a 76 year old female with a prior history of a colonoscopy six years ago  having been negative.   FINDINGS:  Diminutive sigmoid polyps. Sigmoid diverticulosis.   PROCEDURE:  The nature, purpose, and risks of the procedure were familiar to  the patient from prior examination, she provided written consent. Sedation  was fentanyl 75 mcg and Versed 7.5 milligrams IV without arrhythmias or  desaturation. The Olympus adjustable tension pediatric video colonoscope was  advanced to the terminal ileum with the help of a little bit of external  abdominal compression to override loops. The terminal ileum looked normal  and pullback was then performed. The quality of prep was excellent and it  was felt that essentially all areas were well seen (there were some sharp  angulations of the distal colon but by going back and forth through these  areas, I doubt that any blind spots were present sufficient to obscure any  significant lesions).   In the sigmoid region there were two small polyps, one of them slightly  raised and about 2 or 3 mm across and slightly erythematous in appearance,  removed by couple of cold biopsies, and then slightly more distally, a flat  hyperplastic-appearing polyp biopsied and placed in the same jar.   There was also some sigmoid diverticulosis, mild to moderate in amount.   Examination of the rectum showed no evidence of radiation proctitis, but  retroflexion and reinspection of the rectum were  unremarkable.   It should be noted that the patient did have some mild to moderate anal  stenosis; tip of the index finger at the beginning of exam could be inserted  but not the entire finger; we used the pediatric adjustable tension scope  for this procedure it could be passed into the anal orifice without  resistance.   The patient tolerated this procedure well and there were no apparent  complications.   IMPRESSION:  1.  Diminutive sigmoid polyps removed as described above (211.3).  2.  Prior history of anal cancer.  3.  Sigmoid diverticulosis.   PLAN:  Await pathology results.      RB/MEDQ  D:  08/21/2004  T:  08/21/2004  Job:  [id number]   cc:   Loraine Leriche A. Waynard Edwards, M.D.  [address]  [place]  [place] [address]  Fax: [phone]

## 2010-11-24 NOTE — Discharge Summary (Signed)
NAME:  Jeanette [name], Jeanette [name]                         ACCOUNT NO.:  [id number]   MEDICAL RECORD NO.:  [id number]                   PATIENT TYPE:  INP   LOCATION:  3707                                 FACILITY:  [facility]   PHYSICIAN:  Mark A. Perini, M.D.                DATE OF BIRTH:  August 01, 1934   DATE OF ADMISSION:  07/25/2003  DATE OF DISCHARGE:  07/27/2003                                 DISCHARGE SUMMARY   CONSULTANTS:  Charlton Haws, M.D., cardiology consultant from [facility].   DISCHARGE DIAGNOSES:  1. Noncardiac chest pain possibly due to gastrointestinal reflux disease.  2. Mild anemia.  3. Remote history of breast cancer.  4. Remote history of anal cancer.  5. Past history of atrial fibrillation.  6. Chronic migraine headaches.  7. Diagnosis of cerebrovascular disease by an MRA study in 2002.  8. Degenerative disk disease in the cervical spine.  9. Stress urinary incontinence.  10.      Postmenopausal hormone status.   PROCEDURES:  Exercise Cardiolite study which showed no evidence of  reversible ischemia and an ejection fraction of 74%.  This was performed on  July 27, 2003.   DISCHARGE MEDICATIONS:  Jeanette [name] is to resume her previous medications,  including Activella, aspirin, and Neurontin 100 mg three to four tablets  daily.  She is to start Protonix 40 mg once daily with food for the next  four to six weeks and then this will be further assessed.   HISTORY OF PRESENT ILLNESS:  Jeanette [name] is a pleasant 76 year old female  who developed burning substernal chest pressure rated 7/10 in severity on  July 25, 2003.  The pain was fairly continuous and was associated with  shortness of breath, diaphoresis, or nausea.  She presented to the emergency  room at 2130 hours for evaluation.  The chest pain resolved on oxygen to a  great extent, although she still had some mild burning sensation in her  chest at the time of her initial exam.  Initial emergency room  workup,  including EKG and laboratory studies, were unremarkable.  She was admitted  for further evaluation.   HOSPITAL COURSE:  Jeanette [name] remained stable during her hospital course.  She had no recurrence of chest pain.  She was placed on a proton pump  inhibitor.  Cardiology was consulted and assisted with her workup.  She  underwent the stress test with the results as noted above. On July 27, 2003, she was deemed stable for discharge home.   DISCHARGE PHYSICIAN EXAMINATION:  VITAL SIGNS:  Temperature 98.2 degrees,  afebrile, pulse 57, respiratory rate 22, blood pressure 124/75, 98%  saturation on room air.  LUNGS:  Clear to auscultation bilaterally with no wheezing, rhonchi, or  rales.  HEART:  Regular rate and rhythm with no murmur, rub, or gallop.  EXTREMITIES:  There was no peripheral edema.   DISCHARGE LABORATORY  DATA:  EKG showed sinus bradycardia, but was otherwise  normal.  The white count was 6.1 with a hemoglobin of 11.1.  This was down  from 11.7 on admission.  The platelet count was normal.  Troponin I x 3 were  in normal range.  CK and CK-MB enzymes were normal on three serial sets over  the initial 24-hour stay.  MCV was normal at 88.4.  The fasting lipid  profile on July 27, 2003, showed a total cholesterol of 166,  triglycerides 81, HDL 44, total cholesterol/HDL ratio 3.8, and LDL  cholesterol 106.   ACTIVITY:  Jeanette [name] is to be up as tolerated.   DISCHARGE MEDICATIONS:  She is to use her usual pain regimen for her  recurrent and chronic headaches.   DIET:  She is to follow a low-fat, low-cholesterol diet.   FOLLOWUP:  She is to call if she has any recurrent problems.  She will  follow up in our office in two weeks for a post hospital followup.                                                Mark A. Waynard Edwards, M.D.    MAP/MEDQ  D:  07/30/2003  T:  07/31/2003  Job:  [id number]   cc:   Charlton Haws, M.D.

## 2010-11-24 NOTE — Consult Note (Signed)
[name], [name]               ACCOUNT NO.:  [id number]   MEDICAL RECORD NO.:  [id number]          PATIENT TYPE:  INP   LOCATION:  6704                         FACILITY:  [facility]   PHYSICIAN:  Pramod P. Pearlean Brownie, MD    DATE OF BIRTH:  1934-10-25   DATE OF CONSULTATION:  DATE OF DISCHARGE:                                   CONSULTATION   REASON FOR REFERRAL:  Stroke.   HISTORY OF PRESENT ILLNESS:  [name] is a 76 year old Caucasian lady  who developed sudden onset of expressive language difficulties this morning  at around 9:30 a.m.  She said she woke up with a severe headache.  She does  have history of headaches off and on for the last six years.  She has seen  Dr. Melbourne Abts as well as Dr. Meryl Crutch in the [facility] and  has been diagnosed as possible __________  headaches.  She said she has been  struggling with words, trouble expressing herself, getting words out,  though, she can understand quite well.  She denies any slurred speech, focal  extremity weakness, numbness, gait or balance problems.  There is no history  of stroke, TIA or seizures.   PAST MEDICAL HISTORY:  1.  Hypertension.  2.  Hyperlipidemia.  3.  Depression.  4.  History of paroxysmal atrial fibrillation in 2002.  She says she has had      some arrhythmias off and on since then, but has not had further atrial      fibrillation and has never been placed on Coumadin.   HOME MEDICATIONS:  1.  Neurontin.  2.  Lexapro.  3.  Toprol.  4.  Corgard.  5.  Vicodin.   ALLERGIES:  No known drug allergies.   PAST SURGICAL HISTORY:  None.   SOCIAL HISTORY:  The patient is married and lives with her husband.  She  does not smoke or drink.   REVIEW OF SYSTEMS:  Significant for headache.  No chest pain, fever, cough,  shortness of breath or diarrhea.   PHYSICAL EXAMINATION:  GENERAL APPEARANCE:  Pleasant, Caucasian lady in no  acute distress.  VITAL SIGNS:  Afebrile, pulse 78 and regular, on  admission the patient's  blood pressure 98/68, respiratory rate 18, saturation 95% on room air on 2  liters of oxygen.  HEENT:  Normocephalic, atraumatic.  NECK:  Supple without bruits.  ENT:  Unremarkable.  CARDIAC:  No murmur or gallops.  LUNGS:  Clear to auscultation.  ABDOMEN:  Soft and nontender.  NEUROLOGICAL:  The patient is awake, alert, and cooperative.  There is no  aphasia or paroxysmal dysarthria.  The patient has mild expressive aphagia  with significant word finding difficulties and non-slurring speech.  She  name, repeat and comprehend quite well.  Movements are full range without  nystagmus.  There is no visual field defect.  Face is symmetric.  Bilateral  movements are normal.  Tongue is midline.  Motor sensory exam reveals no  upper extremity drift, symmetric strength, tone, __________ sensation.  There is no finger-to-nose or knee-to-heel dysmetria.  Gait was not tested..   STUDIES:  CT scan of the head done today reveals area of wedge-shaped  hypodensities in the left parietal cortex consistent with acute infarct.  There is an ill-defined area of low density medial to this with some dense  margins which raise concern from __________ , and hence, CT scan was  repeated with contrast, but this was not enhanced and his probably an  artifact.  Admission labs are pending at this time.   IMPRESSION:  A 76 year old lady with sudden onset of expressive aphasia,  likely secondary to left middle cerebral artery branch infarction.  Etiology  to be determined, but possibly cardiogenic embolism given her previous  history of paroxysmal atrial fibrillation.   PLAN:  I would recommend IV heparin, stroke protocol.  MRI scan of the brain  with MRA of the brain, neck and Doppler studies.  Fasting lipid profile,  hemoglobin A1C and homocysteine.  Transesophageal echocardiogram, __________  cardiac thrombi, and speech therapy for language training.  Had a long  discussion with the  patient and her husband regarding her symptoms.  Discussed plan to evaluate and treatment and answered questions.   Thank you for the referral.  Will follow the patient in consult __________  precautions.           ______________________________  Sunny Schlein. Pearlean Brownie, MD     PPS/MEDQ  D:  11/20/2005  T:  11/21/2005  Job:  [id number]

## 2010-11-24 NOTE — Consult Note (Signed)
NAMECOURTENY, Jeanette [name]               ACCOUNT NO.:  [id number]   MEDICAL RECORD NO.:  [id number]          PATIENT TYPE:  INP   LOCATION:  6704                         FACILITY:  [facility]   PHYSICIAN:  Arvilla Meres, M.D. LHCDATE OF BIRTH:  10/29/1934   DATE OF CONSULTATION:  11/23/2005  DATE OF DISCHARGE:                                   CONSULTATION   PRIMARY CARE PHYSICIAN:  Mark A. Perini, M.D.   REASON FOR CONSULTATION:  To evaluate the degree of long term  anticoagulation in the setting of a recent stroke and paroxysmal atrial  fibrillation.   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  This was confirmed by MRI.  MRA of the brain showed a 50%  stenosis of the right external carotid but otherwise no significant intra or  extra cranial lesions.  Per her report, she is relatively active.  She does  have some occasional chest pain but she says that has always been attributed  to costochondritis.  She did have an exercise Myoview, in January 2005,  which showed an EF of 74% with no ischemia.   By her report, she does have a history of atrial fibrillation back in 1999,  but has not had any known recurrence.  She has not been maintained on  Coumadin.  Yesterday, I performed a TE on her as part of her evaluation for  a stroke.  It showed normal LV function with an EF of 60%.  There was  trivial mitral regurgitation without any significant other valvular disease.  Her interatrial septum was intact with no evidence of a PFO or ASD.  However, there was a mobile mass in IVC/right atrial junction which appeared  to be a clot.  Her bubble study was initially negative but then turned  positive after five to six cycles, suggestive of a possible pulmonary AVM  and right-to-left shunt.  A transcranial  Doppler was performed, by Dr.  Pearlean Brownie, today which showed no evidence of right-to-left shunting.  She did  have a chest CT, back in March, which did not show any evidence of pulmonary  AVMs.  There was also no evidence of abdominal lesions.   REVIEW OF SYSTEMS:  Is significant for frequent headaches.  She does have  occasional chest pain which as before she contributes to costochondritis.  She also has complaints of osteoarthritis.  Otherwise, all systems negative  except for HPI and problem list.   PAST MEDICAL HISTORY:  1.  Paroxysmal atrial fibrillation, one episode in 1999.  2.  Hypertension.  3.  History of breast cancer.  4.  History of rectal carcinoma.  5.  Postmenopausal state.  6.  Chronic chest pain, intermittent (possible costochondritis).      1.  An exercise Myoview, in January 2005, showed an EF of 74%, no          evidence of scar or ischemia.  7.  Gastroesophageal  reflux disease.  8.  Anemia.   CURRENT MEDICATIONS:  1.  Lexapro 10 mg a day.  2.  Toprol XL 25 a day.  3.  Cozaar 50 mg a day.  4.  Neurontin 300 mg q.h.s.  5.  Aspirin 325 a day.  6.  Protonix 40 mg b.i.d.  7.  Warfarin.   ALLERGIES TO MEDICATIONS:  NO KNOWN DRUG ALLERGIES.   SOCIAL HISTORY:  The patient is a retired Engineer, civil (consulting).  She lives with her  husband.  She is not a smoker or drink.   FAMILY HISTORY:  Father died at age 47, due to congestive heart failure.  Mother died at age 19, due to complications of Alzheimer's disease.   PHYSICAL EXAMINATION:  GENERAL:  She is well-appearing in no acute distress.  VITAL SIGNS:  Blood pressure is 140/70 with a heart rate of 70.  She is  sating 100% on room air.  HEENT:  Sclerae anicteric.  EOMI.  There is no xanthelasmas.  Mucous  membranes are moist.  NECK:  Supple.  There is no JVD.  Carotids are 2+ bilaterally without any  bruit.  There is no lymphadenopathy or thyromegaly.  CARDIAC:  Regular rate and rhythm with no murmurs, rubs, or gallops.  LUNGS:   Clear.  ABDOMEN:  Soft, nontender, nondistended, mildly obese, good bowel sounds.  No bruits or masses.  EXTREMITIES:  Warm with no cyanosis, clubbing, or edema.  DP pulses are 2+  bilaterally.  NEUROLOGIC:  She is alert and oriented x3.  She moves all four extremities  without difficulty.  Affect is appropriate.   EKG shows sinus bradycardia at a rate 53 with no significant ST-T wave  changes.   LABORATORY:  Show a total cholesterol of 155, triglycerides 44, HDL 44, and  LDL of 102.  Sodium is 141, potassium 3.7, bicarb is 29, glucose is 90, BUN  is 6, creatinine is 0.8.  Liver function tests are within normal range.  White count is 4.8, hemoglobin is 9.9, platelets are 231.   ASSESSMENT:  1.  Acute left brain cerebrovascular accident.  2.  Remote history of paroxysmal atrial fibrillation, 1999, without known      recurrence.  3.  History of chest pain, thought to be noncardiac, normal Cardiolite in      2005.  4.  Hypertension.  5.  Postmenopausal state.  6.  Question of right atrial/IVC clot on TEE.   PLAN/DISCUSSION:  1.  Given her recent CVA in the setting of a history of paroxysmal atrial      fibrillation and hypertension, I feel she needs lifelong anticoagulation      with Coumadin and do not see a need for a repeat TEE.  2.  I would also add aspirin 81 mg for primary prevention of CAD.  3.  Of note, would consider checking a 2-D echo chest wall to further      evaluate the IVC and possible repeating the      CT of her abdomen to rule out renal cell carcinoma or other causes of      IVC clot.  4.  She will obviously need aggressive risk factor management and control of      her lipids and blood pressure.   We appreciate the consult.  Please do not hesitate to call with questions.      Arvilla Meres, M.D. [facility]  Electronically Signed    DB/MEDQ  D:  11/23/2005  T:  11/24/2005  Job:  [id number] 

## 2010-11-24 NOTE — H&P (Signed)
NAME:  Jeanette [name], Jeanette [name]                         ACCOUNT NO.:  [id number]   MEDICAL RECORD NO.:  [id number]                   PATIENT TYPE:  INP   LOCATION:  1844                                 FACILITY:  [facility]   PHYSICIAN:  Barry Dienes. Eloise Harman, M.D.            DATE OF BIRTH:  July 21, 1934   DATE OF ADMISSION:  07/25/2003  DATE OF DISCHARGE:                                HISTORY & PHYSICAL   CHIEF COMPLAINT:  Chest pain.   HISTORY OF PRESENT ILLNESS:  The patient is a 76 year old white female who,  while reading at approximately 1730 hours today, developed a burning  substernal chest pressure of intensity 7/10.  The discomfort was fairly  continuous and not associated with shortness of breath, diaphoresis, or  nausea.  She took an Ultram tablet and presented to the emergency room at  approximately 2130 hours for evaluation.  The chest pain resolved on oxygen  to a great extent.  She currently has grade 3/10 diffuse burning sensation  without shortness of breath.  She has never had similar symptoms.  A  standard emergency room workup with EKG and lab tests was unremarkable and  she is being admitted for further evaluation.   PAST MEDICAL HISTORY:  1. Remote breast cancer.  2. 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  3. Paroxysmal atrial fibrillation.  4. Chronic migraine headaches.  5. Diagnosis of cerebrovascular disease by MRA, 2002.  6. Cervical spine degenerative disk disease at the C5-6 level with     protrusion to the right side and at the C6-7 level.  7. Stress urinary incontinence.   MEDICATIONS PRIOR TO ADMISSION:  1. Activella 1 tablet p.o. daily.  2. Aspirin 1 tab p.o. daily.  3. Neurontin 100 mg 3 to 4 tablets daily.  4. Depakote was discontinued approximately 5 days ago.   ALLERGIES:  EPINEPHRINE.   PAST SURGICAL HISTORY:  1. Right lumpectomy. 1988.  2. July 2004, left breast biopsy.  3. September 2004, dilatation and  curettage.  4. September 2004, ureteral sling procedure with cystoscopy.   FAMILY HISTORY:  Father died at age 54 of congestive heart failure.  Mother  died at age 104 of complications of Alzheimer's disease.   SOCIAL HISTORY:  She is married and has 4 children.  She has no history of  tobacco or alcohol abuse.  She is a semi-retired R.N.   REVIEW OF SYSTEMS:  She has frequent occipital-location headaches.  She  denies recent vision change, shortness of breath, cough, history of  gastroesophageal reflux disease, constipation, or diarrhea.   PHYSICAL EXAMINATION:  VITAL SIGNS:  Initial blood pressure was 170/105,  pulse 83; current blood pressure is 156/77, pulse of 71, respirations 17,  temperature 98.8, pulse oxygen saturation 99% on room air.  GENERAL:  In general, she is a well-nourished, well-developed white female  who is in no apparent distress.  HEENT:  Exam was within normal limits.  NECK:  Neck was supple with a full range of motion.  There was no jugular  venous distention or carotid bruits.  CHEST:  Chest was clear to auscultation.  HEART:  Heart had a regular rate and rhythm.  S1 and S2 are present without  murmur, gallop, or rub.  ABDOMEN:  Abdomen had normal bowel sounds with no hepatosplenomegaly or  tenderness.  EXTREMITIES:  Extremities were without cyanosis, clubbing, or edema.  NEUROLOGICAL:  She is alert and oriented x3, cranial nerves II-XII were  normal, sensory exam was grossly normal, motor strength was 5/5 throughout  and gait was not assessed.   LABORATORY STUDIES:  White blood cell count 6.7, hemoglobin 11, hematocrit  34.8, platelet count 366,000.  Serum sodium 141, potassium 3.8, chloride  105, BUN 12, creatinine 0.5, glucose 108.  CK less than 1.0, troponin I less  than 0.05.   EKG showed:  #1 - Normal sinus rhythm; #2 - within normal limits.   IMPRESSION AND PLAN:  1. Chest pain:  This is atypical for coronary artery disease and could be     due  to gastroesophageal reflux disease or other noncardiac etiologies.  I     plan to check serial cardiac isoenzymes to rule out the unlikely     possibility of a non-Q-wave myocardial infarction.  If her enzyme tests     are normal, she will likely have a cardiac risk stratification procedure     as an outpatient.  For now, she will be continued on aspirin with a beta     blocker, intravenous heparin, and oxygen.  2. Headaches:  These are chronic, despite low-dose Neurontin treatment.  We     will consider increasing the Neurontin dose to 300 mg twice daily.  3. History of paroxysmal atrial fibrillation:  Stable off of medications.                                                Barry Dienes Eloise Harman, M.D.    DGP/MEDQ  D:  07/26/2003  T:  07/26/2003  Job:  [id number]   cc:   Loraine Leriche A. Waynard Edwards, M.D.  [address]  [place]  [place] [address]  Fax: [phone]   W. Varney Baas, M.D.  [address] [place]  [place] [address]  Fax: [phone]   Genene Churn. Love, M.D.  [address]  [address]  [place]  [place] [address]  Fax: [phone]

## 2010-11-24 NOTE — Op Note (Signed)
NAME:  Jeanette [name], Jeanette [name]                         ACCOUNT NO.:  [id number]   MEDICAL RECORD NO.:  [id number]                   PATIENT TYPE:  AMB   LOCATION:  DAY                                  FACILITY:  [facility]   PHYSICIAN:  Freddy Finner, M.D.                DATE OF BIRTH:  Sep 10, 1934   DATE OF PROCEDURE:  03/16/2003  DATE OF DISCHARGE:                                 OPERATIVE REPORT   PREOPERATIVE DIAGNOSES:  1. Postmenopausal bleeding.  2. Hormone replacement therapy.  3. Endometrial polyp by sonohysterogram done in the office.   POSTOPERATIVE DIAGNOSES:  1. Postmenopausal bleeding.  2. Hormone replacement therapy.  3. Endometrial polyp by sonohysterogram done in the office.  4. Finding of endometrial polyp.   SURGEON:  Freddy Finner, M.D.   ANESTHESIA:  General.   ESTIMATED INTRAOPERATIVE BLOOD LOSS:  10 mL.   INTRAOPERATIVE SORBITOL DEFICIT:  50 mL.   SECONDARY ANESTHETIC:  Paracervical block using 10 mL of 1% Xylocaine with  injections at 4 and 8 o'clock in the vaginal fornices.   The patient is a 76 year old, white, married female who is on hormone  replacement therapy and had an episode of postmenopausal bleeding.  A  subsequent sonohysterogram in this office revealed an endometrial polyp.  She is now admitted for hysteroscopy and D&C.  This procedure is to be  followed by tension-free vaginal tape suspension of urethral neck by Lucrezia Starch. Earlene Plater, M.D.  Intraoperative findings could not be photographed because  the equipment was not available in the room for that procedure.  The  intraoperative finding was of an endometrial polyp, somewhat pedunculated,  attached to the anterior upper fundal surface.  The uterus itself sounded to  approximately 10 cm.   The patient was admitted on the morning of surgery.  She was given a gram of  Ancef preoperatively.  She was brought to the operating room and there  placed under general anesthesia and placed in the  dorsolithotomy position.  A Betadine prep of the abdomen, perineum, and vagina was carried out.  Sterile drapes were applied.  A bivalved speculum was introduced into the  vagina.  The cervix was grasped on the anterior lip with a single-tooth  tenaculum.  The cervix appeared to be somewhat stenotic.  Initial dilatation  was carried out using finely tipped Hanks dilators.  The uterus sounded to  approximately 10 cm.  The [facility] 12.5 degree hysteroscope was then used with  3% Sorbitol as an distending median.  Inspection of the endometrial cavity  was carried out without significant difficulty or intraoperative  complications.  The polyp was identified.  Photographs could not be taken as  noted above.  Gentle thorough curettage and exploration with polyp forceps  were carried out.  Reinspection revealed adequate removal of  the polyp and endometrial sampling, which was complete.  The procedure was  then terminated.  All of the instruments were removed.  The procedure was  turned over to Winston L. Earlene Plater, M.D., to complete the bladder suspension.                                               Freddy Finner, M.D.    WRN/MEDQ  D:  03/16/2003  T:  03/16/2003  Job:  [id number]

## 2010-11-24 NOTE — Op Note (Signed)
NAME:  Jeanette [name], Jeanette [name]                         ACCOUNT NO.:  [id number]   MEDICAL RECORD NO.:  [id number]                   PATIENT TYPE:  AMB   LOCATION:  DAY                                  FACILITY:  [facility]   PHYSICIAN:  Ronald L. Ovidio Hanger, M.D.           DATE OF BIRTH:  March 29, 1935   DATE OF PROCEDURE:  03/16/2003  DATE OF DISCHARGE:                                 OPERATIVE REPORT   PREOPERATIVE DIAGNOSIS:  Type 3 stress urinary incontinence.   POSTOPERATIVE DIAGNOSIS:  Type 3 stress urinary incontinence.   PROCEDURE:  Transvaginal tape (TVT) procedure and cystourethroscopy.   SURGEON:  Lucrezia Starch. Earlene Plater, M.D.   ANESTHESIA:  General endotracheal airway.   ESTIMATED BLOOD LOSS:  50 mL.   TUBES:  None.   COMPLICATIONS:  None.   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 She underwent a  fluid evaluation and was subsequently found on straining cystogram to have  some descensus but had beaking __________ type 3 urinary incontinence. She  subsequently underwent one probe urodynamics and was found to have a  Valsalva leak point pressure of 60 cm of water. After understanding the  risks, benefits, and alternatives, she has elected to proceed with a TVT  procedure. She is to undergo gynecologic surgery by Dr. Konrad Dolores to be  dictated separately and this was performed prior to my proceeding.   DESCRIPTION OF PROCEDURE:  The patient had undergone anesthesia, been placed  in the dorsal lithotomy position and undergone her uterine procedure by Dr.  Jennette Kettle when I was asked to enter the operating room. A 16 French Foley  catheter was passed and the bladder was drained. Proper markings were  obtained at one finger breadth superior to the midline of the pubic  symphysis and two finger breadths laterally and the vaginal mucosa was  injected with plain Xylocaine at the urethrovesical junction due  to her  epinephrine allergy. A 1 cm incision was then made with a knife up through  the mucosa, the vaginal flaps were created bilaterally to the endopelvic but  not through the endopelvic fascia. Utilizing the passing needles, needles  were perforated from above, delivered intravaginally and cystourethroscopy  with both the 12 and 7 degree  windows revealing a fully distended bladder  revealed no lesions. The bladder was smooth wall and efflux of clear urine  was noted from the normally placed ureteral orifices bilaterally. The  bladder was drained and the panendoscope was removed. The TVT tape was then  placed into position with the passing instruments but the sheath was  deployed and cystourethroscopy was performed again and again no perforation  was noted. The sheaths were then deployed after the bladder had been drained  over a hemostat. The tape appeared to completely deployed. It appeared to be  in excellent position without undue tension. The  hemostat could be passed  behind it. With a full bladder, Valsalva was performed and there was slight  leakage at high Valsalva pressures which was as we wished and there was no  undue tinting on the urethra. The bladder was drained and cystourethroscopy  was performed and again no perforations were noted. The vaginal mucosa was  closed with a  running 3-0 Vicryl suture on a UR6 needle and the tapes were cut at the skin  level and puncture holes closed with Dermabond. A vaginal pack was placed  with 1/2 inch iodoform gauze saturated with bacitracin ointment and the  patient was taken to the recovery room stable.                                               Ronald L. Ovidio Hanger, M.D.    RLD/MEDQ  D:  03/16/2003  T:  03/16/2003  Job:  [id number]

## 2010-12-08 ENCOUNTER — Other Ambulatory Visit: Payer: Self-pay | Admitting: Gastroenterology

## 2011-01-09 ENCOUNTER — Other Ambulatory Visit: Payer: Self-pay | Admitting: Obstetrics & Gynecology

## 2011-01-09 DIAGNOSIS — Z853 Personal history of malignant neoplasm of breast: Secondary | ICD-10-CM

## 2011-01-09 DIAGNOSIS — Z1231 Encounter for screening mammogram for malignant neoplasm of breast: Secondary | ICD-10-CM

## 2011-01-24 ENCOUNTER — Ambulatory Visit
Admission: RE | Admit: 2011-01-24 | Discharge: 2011-01-24 | Disposition: A | Discharge status: Home / Self Care | Payer: Medicare Other | Source: Ambulatory Visit | Attending: Obstetrics & Gynecology | Admitting: Obstetrics & Gynecology

## 2011-01-24 DIAGNOSIS — Z1231 Encounter for screening mammogram for malignant neoplasm of breast: Secondary | ICD-10-CM

## 2011-01-24 DIAGNOSIS — Z853 Personal history of malignant neoplasm of breast: Secondary | ICD-10-CM

## 2011-06-01 ENCOUNTER — Encounter: Payer: Self-pay | Admitting: *Deleted

## 2011-06-01 ENCOUNTER — Emergency Department (HOSPITAL_COMMUNITY)
Admission: EM | Admit: 2011-06-01 | Discharge: 2011-06-01 | Disposition: A | Discharge status: Home / Self Care | Payer: Medicare Other | Attending: Emergency Medicine | Admitting: Emergency Medicine

## 2011-06-01 ENCOUNTER — Emergency Department (HOSPITAL_COMMUNITY): Payer: Medicare Other

## 2011-06-01 DIAGNOSIS — Z85048 Personal history of other malignant neoplasm of rectum, rectosigmoid junction, and anus: Secondary | ICD-10-CM | POA: Insufficient documentation

## 2011-06-01 DIAGNOSIS — Z79899 Other long term (current) drug therapy: Secondary | ICD-10-CM | POA: Insufficient documentation

## 2011-06-01 DIAGNOSIS — R51 Headache: Secondary | ICD-10-CM | POA: Insufficient documentation

## 2011-06-01 DIAGNOSIS — Z7901 Long term (current) use of anticoagulants: Secondary | ICD-10-CM | POA: Insufficient documentation

## 2011-06-01 DIAGNOSIS — R110 Nausea: Secondary | ICD-10-CM | POA: Insufficient documentation

## 2011-06-01 DIAGNOSIS — F17200 Nicotine dependence, unspecified, uncomplicated: Secondary | ICD-10-CM | POA: Insufficient documentation

## 2011-06-01 DIAGNOSIS — Z8673 Personal history of transient ischemic attack (TIA), and cerebral infarction without residual deficits: Secondary | ICD-10-CM | POA: Insufficient documentation

## 2011-06-01 DIAGNOSIS — Z853 Personal history of malignant neoplasm of breast: Secondary | ICD-10-CM | POA: Insufficient documentation

## 2011-06-01 DIAGNOSIS — R42 Dizziness and giddiness: Secondary | ICD-10-CM

## 2011-06-01 DIAGNOSIS — I10 Essential (primary) hypertension: Secondary | ICD-10-CM | POA: Insufficient documentation

## 2011-06-01 DIAGNOSIS — R279 Unspecified lack of coordination: Secondary | ICD-10-CM | POA: Insufficient documentation

## 2011-06-01 HISTORY — DX: Cerebral infarction, unspecified: I63.9

## 2011-06-01 HISTORY — DX: Essential (primary) hypertension: I10

## 2011-06-01 HISTORY — DX: Malignant neoplasm of unspecified site of unspecified female breast: C50.919

## 2011-06-01 HISTORY — DX: Malignant neoplasm of anus, unspecified: C21.0

## 2011-06-01 LAB — POCT I-STAT, CHEM 8
BUN: 12 mg/dL (ref 6–23)
Chloride: 103 mEq/L (ref 96–112)
Glucose, Bld: 86 mg/dL (ref 70–99)
HCT: 37 % (ref 36.0–46.0)
Potassium: 3.6 mEq/L (ref 3.5–5.1)

## 2011-06-01 LAB — PROTIME-INR: Prothrombin Time: 26 seconds — ABNORMAL HIGH (ref 11.6–15.2)

## 2011-06-01 MED ORDER — MECLIZINE HCL 25 MG PO TABS
25.0000 mg | ORAL_TABLET | Freq: Once | ORAL | Status: AC
  Administered 2011-06-01: 25 mg via ORAL
  Filled 2011-06-01: qty 1

## 2011-06-01 MED ORDER — ONDANSETRON 8 MG PO TBDP: 8.0000 mg | ORAL_TABLET | Freq: Three times a day (TID) | ORAL | Status: AC | PRN

## 2011-06-01 NOTE — ED Notes (Signed)
PT to remain in PODA10 to await results of MRI.  Pt is not moving to CDU at this time.

## 2011-06-01 NOTE — ED Notes (Signed)
Patient has gone to mri.  Will do vitals on her return.

## 2011-06-01 NOTE — ED Notes (Signed)
Discharge instructions reviewed with pt/family.  Verbalizes understanding.  NAD noted. Pt to lobby via wheelchair.

## 2011-06-01 NOTE — ED Notes (Signed)
Patient states she prefers to sit in a chair.

## 2011-06-01 NOTE — ED Provider Notes (Signed)
History     CSN: [id number] Arrival date & time: 06/01/2011  1:05 PM   First MD Initiated Contact with Patient 06/01/11 1714      Chief Complaint  Patient presents with  . Dizziness  . Headache    (Consider location/radiation/quality/duration/timing/severity/associated sxs/prior treatment) HPI History provided by pt.   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  She woke at 2am today to use restroom and was both dizzy at ataxic.  Woke w/ the same sx this am.  Associated w/ nausea.  Took meclizine at approx 12:30pm and sx improved shortly after.  Dizziness and nausea more severe than she has had w/ BPPV in the past and does not seem to be aggravated by movement.  Denies head pain, blurred vision, confusion, dysarthria, dysphagia, weakness and paresthesias.  No head trauma.  Pt is on coumadin for atrial fibrillation.  Currently on cipro for UTI which she has taken in the past.  No other recent medication changes.   Past Medical History  Diagnosis Date  . Breast cancer   . Anal cancer   . Breast cancer   . Hypertension   . Stroke   . Cancer of breast     Past Surgical History  Procedure Date  . Tubal ligation   . Ovarian cyst removal   . Breast lumpectomy     No family history on file.  History  Substance Use Topics  . Smoking status: Current Everyday Smoker  . Smokeless tobacco: Not on file  . Alcohol Use: Yes    OB History    Grav Para Term Preterm Abortions TAB SAB Ect Mult Living                  Review of Systems  All other systems reviewed and are negative.    Allergies  Epinephrine and Lotensin  Home Medications   Current Outpatient Rx  Name Route Sig Dispense Refill  . ESTROGENS, CONJUGATED 0.625 MG/GM VA CREA Vaginal Place 1 g vaginally every other day.      Marland Kitchen ESCITALOPRAM OXALATE 10 MG PO TABS Oral Take 10 mg by mouth daily.      Marland Kitchen ESTROGENS CONJUGATED 0.625 MG PO TABS Oral Take 0.625 mg by mouth daily. Take daily for 21 days then do not take  for 7 days.     . OMEGA-3 FATTY ACIDS 1000 MG PO CAPS Oral Take 2 g by mouth every other day.      Marland Kitchen GABAPENTIN 100 MG PO CAPS Oral Take 100 mg by mouth daily.      Marland Kitchen MECLIZINE HCL 25 MG PO TABS Oral Take 25 mg by mouth daily as needed. Dizzy.      Marland Kitchen METOPROLOL SUCCINATE 200 MG PO TB24 Oral Take 200 mg by mouth daily.      Marland Kitchen OLMESARTAN MEDOXOMIL 20 MG PO TABS Oral Take 20 mg by mouth at bedtime and may repeat dose one time if needed.      Marland Kitchen TRAMADOL HCL 50 MG PO TABS Oral Take 50 mg by mouth every 6 (six) hours as needed. Maximum dose= 8 tablets per day. For pain     . VITAMIN B-12 100 MCG PO TABS Oral Take 50 mcg by mouth daily.      . WARFARIN SODIUM 5 MG PO TABS Oral Take 5 mg by mouth daily. Take half tab on mon wed and fri and whole tab rest of days.       BP 178/70  Pulse 59  Temp(Src) 97.8 F (36.6 C) (Oral)  Resp 18  SpO2 100%  Physical Exam  Nursing note and vitals reviewed. Constitutional: She is oriented to person, place, and time. She appears well-developed and well-nourished. No distress.  HENT:  Head: Normocephalic and atraumatic.  Eyes:       Normal appearance  Neck: Normal range of motion.  Cardiovascular: Normal rate, regular rhythm and intact distal pulses.   Pulmonary/Chest: Effort normal and breath sounds normal.  Musculoskeletal: Normal range of motion.  Neurological: She is alert and oriented to person, place, and time. She has normal reflexes. She displays no tremor. No cranial nerve deficit or sensory deficit. She displays a negative Romberg sign. Coordination and gait normal.       Pt reports dizziness while laying in bed. 5/5 and equal upper and lower extremity strength.  No past pointing.  No pronator drift.  No nystagmus.   Skin: Skin is warm and dry. No rash noted.  Psychiatric: She has a normal mood and affect. Her behavior is normal.    ED Course  Procedures (including critical care time)  Labs Reviewed  PROTIME-INR - Abnormal; Notable for the  following:    Prothrombin Time 26.0 (*)    INR 2.34 (*)    All other components within normal limits  POCT I-STAT, CHEM 8 - Abnormal; Notable for the following:    Calcium, Ion 1.34 (*)    All other components within normal limits  I-STAT, CHEM 8   Ct Head Wo Contrast  06/01/2011  *RADIOLOGY REPORT*  Clinical Data: Unsteady gait.  Dizziness.  Headache.  CT HEAD WITHOUT CONTRAST  Technique:  Contiguous axial images were obtained from the base of the skull through the vertex without contrast.  Comparison: 03/14/2009  Findings: The brain stem, cerebellum, cerebral peduncles, thalami, basal ganglia, basilar cisterns, and ventricular system appear unremarkable.  Incidental note is made of a cavum septi pellucidi and cavum vergae.  Remote left posterior frontal lobe encephalomalacia noted.  No intracranial hemorrhage, mass lesion, or acute infarction is identified.  IMPRESSION:  1.  No acute intracranial findings. 2.  Remote left posterior frontal lobe encephalomalacia.  Original Report Authenticated By: Dellia Cloud, M.D.     1. Vertigo       MDM  Pt w/ h/o CVA as well as BPPV presents w/ dizziness, ataxia and nausea.  Sx worse than normally experiences w/ BPPV.  No focal neuro deficits on exam; nml coordination, nml gait.  No nystagmus.  CT head neg and MRI neg for acute pathology.  Results discussed w/ pt.  Dizziness has recurred and she reports that it increases every time she moves her head.  Will give her a dose of meclizine prior to discharge; this medication resolved her sx at home this afternoon.  Advised her to f/u with Dr. Waynard Edwards early next week as well as Dr. Sandria Manly, her neurologist.  Return precautions discussed.        Otilio Miu, [place] 06/01/11 2019

## 2011-06-01 NOTE — ED Notes (Signed)
Patient started cipro on Wed for uti.  She states on yesterday she had diarrhea.  Patient has had frequent urination.  Patient states at 2am she got up to the bathroom she was stumbling,  Unable to stand up w/o help.  She states she went back to bed and when she got up her sx continued.  She states she was leaning to the left.  Patient took meclizine and her sx have decreased.  Patient has hx of cancer.  She states this episode of dizziness/unsteady gait, decreased in the past 30 min.  She continues to have nausea and some dizziness.

## 2011-06-01 NOTE — ED Notes (Signed)
Order received to move Pt to CDU to await brain MRI.  Pt made aware of orders and POC.

## 2011-06-01 NOTE — ED Provider Notes (Signed)
Medical screening examination/treatment/procedure(s) were performed by non-physician practitioner and as supervising physician I was immediately available for consultation/collaboration.  Avamarie Crossley R. Damarian Priola, MD 06/01/11 2350 

## 2011-07-09 ENCOUNTER — Other Ambulatory Visit: Payer: Self-pay | Admitting: Urology

## 2011-07-09 DIAGNOSIS — N3020 Other chronic cystitis without hematuria: Secondary | ICD-10-CM | POA: Insufficient documentation

## 2011-07-17 ENCOUNTER — Ambulatory Visit
Admission: RE | Admit: 2011-07-17 | Discharge: 2011-07-17 | Disposition: A | Discharge status: Home / Self Care | Payer: Medicare Other | Source: Ambulatory Visit | Attending: Urology | Admitting: Urology

## 2011-07-17 DIAGNOSIS — N3020 Other chronic cystitis without hematuria: Secondary | ICD-10-CM

## 2012-08-01 ENCOUNTER — Other Ambulatory Visit (HOSPITAL_COMMUNITY): Payer: Self-pay | Admitting: Internal Medicine

## 2012-08-08 ENCOUNTER — Encounter (HOSPITAL_COMMUNITY): Payer: Medicare Other

## 2012-08-11 ENCOUNTER — Other Ambulatory Visit (HOSPITAL_COMMUNITY): Payer: Self-pay | Admitting: Internal Medicine

## 2012-08-11 DIAGNOSIS — I701 Atherosclerosis of renal artery: Secondary | ICD-10-CM

## 2012-09-09 ENCOUNTER — Ambulatory Visit (HOSPITAL_COMMUNITY)
Admission: RE | Admit: 2012-09-09 | Discharge: 2012-09-09 | Disposition: A | Discharge status: Home / Self Care | Payer: Medicare Other | Source: Ambulatory Visit | Attending: Cardiovascular Disease | Admitting: Cardiovascular Disease

## 2012-09-09 ENCOUNTER — Encounter (HOSPITAL_COMMUNITY): Payer: Medicare Other

## 2012-09-09 DIAGNOSIS — I701 Atherosclerosis of renal artery: Secondary | ICD-10-CM

## 2012-09-09 HISTORY — PX: OTHER SURGICAL HISTORY: SHX169

## 2012-09-09 NOTE — Progress Notes (Signed)
Renal Duplex Completed. Sturdivant, Rita D  

## 2012-09-09 NOTE — Progress Notes (Signed)
Aorta Mesenteric Duplex Completed. Chauncy Lean

## 2012-11-25 ENCOUNTER — Telehealth: Payer: Self-pay | Admitting: Cardiovascular Disease

## 2013-01-08 ENCOUNTER — Telehealth: Payer: Self-pay | Admitting: *Deleted

## 2013-01-08 DIAGNOSIS — I701 Atherosclerosis of renal artery: Secondary | ICD-10-CM

## 2013-01-08 NOTE — Telephone Encounter (Signed)
Routine renal doppler ordered

## 2013-02-25 ENCOUNTER — Ambulatory Visit: Payer: Medicare Other | Admitting: Cardiovascular Disease

## 2013-03-16 ENCOUNTER — Other Ambulatory Visit: Payer: Self-pay | Admitting: Urology

## 2013-03-16 DIAGNOSIS — R3989 Other symptoms and signs involving the genitourinary system: Secondary | ICD-10-CM

## 2013-03-18 ENCOUNTER — Other Ambulatory Visit: Payer: Self-pay | Admitting: Urology

## 2013-03-18 DIAGNOSIS — R3989 Other symptoms and signs involving the genitourinary system: Secondary | ICD-10-CM

## 2013-03-26 ENCOUNTER — Ambulatory Visit
Admission: RE | Admit: 2013-03-26 | Discharge: 2013-03-26 | Disposition: A | Discharge status: Home / Self Care | Payer: Medicare Other | Source: Ambulatory Visit | Attending: Urology | Admitting: Urology

## 2013-03-26 ENCOUNTER — Other Ambulatory Visit: Payer: Medicare Other

## 2013-03-26 DIAGNOSIS — R3989 Other symptoms and signs involving the genitourinary system: Secondary | ICD-10-CM

## 2013-03-26 MED ORDER — IOHEXOL 300 MG/ML  SOLN
100.0000 mL | Freq: Once | INTRAMUSCULAR | Status: AC | PRN
  Administered 2013-03-26: 100 mL via INTRAVENOUS

## 2013-09-24 ENCOUNTER — Telehealth: Payer: Self-pay | Admitting: Cardiovascular Disease

## 2013-09-24 DIAGNOSIS — I701 Atherosclerosis of renal artery: Secondary | ICD-10-CM

## 2013-09-24 NOTE — Telephone Encounter (Signed)
Pt got letter that she needs to come in and see Dr. Gwenlyn Found in April.  She wants to know if she needs a renal doppler before her appt.

## 2013-09-24 NOTE — Telephone Encounter (Signed)
Will have to ask Theodoro Kos And notify patient

## 2013-09-25 NOTE — Telephone Encounter (Signed)
Before speaking to the patient, RN spoke to Dr Gwenlyn Found 's nurse Curt Bears. Confirmed doppler is needed prior to office visit . Called and spoke to patient , RN informed patient  That she will needed doppler prior to appointment with Dr Gwenlyn Found and she needs an appointment with Dr Gwenlyn Found as well.  Scheduler will give her a call about the schedule time.

## 2013-10-02 ENCOUNTER — Ambulatory Visit (HOSPITAL_COMMUNITY)
Admission: RE | Admit: 2013-10-02 | Discharge: 2013-10-02 | Disposition: A | Discharge status: Home / Self Care | Payer: Medicare Other | Source: Ambulatory Visit | Attending: Internal Medicine | Admitting: Internal Medicine

## 2013-10-02 DIAGNOSIS — I701 Atherosclerosis of renal artery: Secondary | ICD-10-CM | POA: Insufficient documentation

## 2013-10-02 NOTE — Progress Notes (Signed)
Renal Artery Duplex Completed. °Brianna L Mazza,RVT °

## 2013-10-06 ENCOUNTER — Encounter: Payer: Self-pay | Admitting: *Deleted

## 2013-10-06 ENCOUNTER — Telehealth: Payer: Self-pay | Admitting: *Deleted

## 2013-10-06 DIAGNOSIS — I701 Atherosclerosis of renal artery: Secondary | ICD-10-CM

## 2013-10-06 NOTE — Telephone Encounter (Signed)
Order placed for repeat renal dopplers in 1 year  

## 2013-10-06 NOTE — Telephone Encounter (Signed)
Message copied by Chauncy Lean on Tue Oct 06, 2013 12:55 PM ------      Message from: Lorretta Harp      Created: Tue Oct 06, 2013  6:59 AM       No change from prior study. Repeat in 12 months. ------

## 2013-10-21 ENCOUNTER — Ambulatory Visit (INDEPENDENT_AMBULATORY_CARE_PROVIDER_SITE_OTHER): Payer: Medicare Other | Admitting: Cardiovascular Disease

## 2013-10-21 ENCOUNTER — Encounter: Payer: Self-pay | Admitting: Cardiovascular Disease

## 2013-10-21 VITALS — BP 110/61 | HR 63 | Ht 64.5 in | Wt 159.0 lb

## 2013-10-21 DIAGNOSIS — I10 Essential (primary) hypertension: Secondary | ICD-10-CM

## 2013-10-21 DIAGNOSIS — E785 Hyperlipidemia, unspecified: Secondary | ICD-10-CM

## 2013-10-21 DIAGNOSIS — I701 Atherosclerosis of renal artery: Secondary | ICD-10-CM

## 2013-10-21 NOTE — Assessment & Plan Note (Signed)
Serial annual renal artery Dopplers performed as recently 10/02/13 revealed moderate right renal artery stenosis with normal left renal artery velocity is unchanged from her prior Doppler a year ago although her right renal pole-to-pole dimension has decreased about a 1 cm. We will continue to follow

## 2013-10-21 NOTE — Patient Instructions (Signed)
Your physician wants you to follow-up in: 1 year with Dr Berry. You will receive a reminder letter in the mail two months in advance. If you don't receive a letter, please call our office to schedule the follow-up appointment.  

## 2013-10-21 NOTE — Assessment & Plan Note (Signed)
Not on statin therapy because she statin intolerant

## 2013-10-21 NOTE — Assessment & Plan Note (Signed)
Controlled on current medications 

## 2013-10-21 NOTE — Progress Notes (Signed)
10/21/2013 Jeanette [name]   10-19-1934  [id number]  Primary Physician Jerlyn Ly, MD Primary Cardiologist: Lorretta Harp MD Renae Gloss   HPI:  The patient is a very pleasant 79 year old mildly overweight married Caucasian female, mother of 1 biologic and 3 stepchildren, grandmother to 3 grandchildren, referred to me by Dr. Joylene Draft for peripheral vascular evaluation because of renal artery stenosis.  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 Risk factors include hypertension for the last 15 years, medically treated, and hyperlipidemia. She has never smoked. There is no family history for heart disease. She denies chest pain or shortness of breath. She has had atrial fibrillation in the past and has had a stroke from this, on Coumadin anticoagulation. Past history otherwise is remarkable for treated anal and breast cancer many years ago. She has had renal Dopplers 7 years ago that showed mild right renal artery stenosis, and followup Dopplers performed last month that showed mild progression of the right renal aortic ratio 62f 4.41.  Current Outpatient Prescriptions  Medication Sig Dispense Refill  . amLODipine (NORVASC) 10 MG tablet Take 10 mg by mouth daily.      Marland Kitchen conjugated estrogens (PREMARIN) vaginal cream Place 1 g vaginally every other day.        . escitalopram (LEXAPRO) 10 MG tablet Take 10 mg by mouth daily.        Marland Kitchen gabapentin (NEURONTIN) 100 MG capsule Take 100 mg by mouth daily.        . meclizine (ANTIVERT) 25 MG tablet Take 25 mg by mouth daily as needed. Dizzy.        . metoprolol (TOPROL-XL) 200 MG 24 hr tablet Take 200 mg by mouth daily.        Marland Kitchen olmesartan (BENICAR) 20 MG tablet Take 10 mg by mouth 2 (two) times daily.       . vitamin B-12 (CYANOCOBALAMIN) 100 MCG tablet Take 50 mcg by mouth daily.        Marland Kitchen warfarin (COUMADIN) 5 MG tablet Take 5 mg by mouth daily. Take half tab on mon wed and fri and whole tab rest of days.        No current  facility-administered medications for this visit.    Allergies  Allergen Reactions  . Benazepril Hcl     cough  . Epinephrine     Shortness of breath and increased heart rate  . Lisinopril     Cough  . Vytorin [Ezetimibe-Simvastatin]     History   Social History  . Marital Status: Married    Spouse Name: N/A    Number of Children: N/A  . Years of Education: N/A   Occupational History  . Not on file.   Social History Main Topics  . Smoking status: Current Every Day Smoker  . Smokeless tobacco: Not on file  . Alcohol Use: Yes  . Drug Use: No  . Sexual Activity:    Other Topics Concern  . Not on file   Social History Narrative  . No narrative on file     Review of Systems: General: negative for chills, fever, night sweats or weight changes.  Cardiovascular: negative for chest pain, dyspnea on exertion, edema, orthopnea, palpitations, paroxysmal nocturnal dyspnea or shortness of breath Dermatological: negative for rash Respiratory: negative for cough or wheezing Urologic: negative for hematuria Abdominal: negative for nausea, vomiting, diarrhea, bright red blood per rectum, melena, or hematemesis Neurologic: negative for visual changes, syncope, or dizziness All other  systems reviewed and are otherwise negative except as noted above.    Blood pressure 110/61, pulse 63, height 5' 4.5" (1.638 m), weight 159 lb (72.122 kg).  General appearance: alert and no distress Neck: no adenopathy, no carotid bruit, no JVD, supple, symmetrical, trachea midline and thyroid not enlarged, symmetric, no tenderness/mass/nodules Lungs: clear to auscultation bilaterally Heart: regular rate and rhythm, S1, S2 normal, no murmur, click, rub or gallop Extremities: extremities normal, atraumatic, no cyanosis or edema  EKG not performed today  ASSESSMENT AND PLAN:   DYSLIPIDEMIA Not on statin therapy because she statin intolerant  Renal artery stenosis Serial annual renal artery  Dopplers performed as recently 10/02/13 revealed moderate right renal artery stenosis with normal left renal artery velocity is unchanged from her prior Doppler a year ago although her right renal pole-to-pole dimension has decreased about a 1 cm. We will continue to follow  Essential hypertension Controlled on current medications      Lorretta Harp MD [facility], [facility] 10/21/2013 4:39 PM

## 2013-10-29 NOTE — Telephone Encounter (Signed)
Closed Encounter  °

## 2014-09-02 ENCOUNTER — Telehealth (HOSPITAL_COMMUNITY): Payer: Self-pay | Admitting: *Deleted

## 2015-04-22 ENCOUNTER — Other Ambulatory Visit: Payer: Self-pay | Admitting: Cardiovascular Disease

## 2015-04-22 DIAGNOSIS — I701 Atherosclerosis of renal artery: Secondary | ICD-10-CM

## 2015-04-28 ENCOUNTER — Ambulatory Visit (HOSPITAL_COMMUNITY)
Admission: RE | Admit: 2015-04-28 | Discharge: 2015-04-28 | Disposition: A | Discharge status: Home / Self Care | Payer: Medicare Other | Source: Ambulatory Visit | Attending: Cardiovascular Disease | Admitting: Cardiovascular Disease

## 2015-04-28 DIAGNOSIS — I10 Essential (primary) hypertension: Secondary | ICD-10-CM | POA: Insufficient documentation

## 2015-04-28 DIAGNOSIS — F17200 Nicotine dependence, unspecified, uncomplicated: Secondary | ICD-10-CM | POA: Insufficient documentation

## 2015-04-28 DIAGNOSIS — I701 Atherosclerosis of renal artery: Secondary | ICD-10-CM | POA: Diagnosis not present

## 2015-04-28 DIAGNOSIS — E785 Hyperlipidemia, unspecified: Secondary | ICD-10-CM | POA: Diagnosis not present

## 2016-08-27 ENCOUNTER — Encounter (HOSPITAL_COMMUNITY): Payer: Self-pay | Admitting: *Deleted

## 2016-08-27 ENCOUNTER — Emergency Department (HOSPITAL_COMMUNITY)
Admission: EM | Admit: 2016-08-27 | Discharge: 2016-08-27 | Disposition: A | Discharge status: Home / Self Care | Payer: Medicare Other | Attending: Emergency Medicine | Admitting: Emergency Medicine

## 2016-08-27 ENCOUNTER — Emergency Department (HOSPITAL_COMMUNITY): Payer: Medicare Other

## 2016-08-27 DIAGNOSIS — Z85048 Personal history of other malignant neoplasm of rectum, rectosigmoid junction, and anus: Secondary | ICD-10-CM | POA: Diagnosis not present

## 2016-08-27 DIAGNOSIS — Z8673 Personal history of transient ischemic attack (TIA), and cerebral infarction without residual deficits: Secondary | ICD-10-CM | POA: Insufficient documentation

## 2016-08-27 DIAGNOSIS — H539 Unspecified visual disturbance: Secondary | ICD-10-CM | POA: Insufficient documentation

## 2016-08-27 DIAGNOSIS — Z7901 Long term (current) use of anticoagulants: Secondary | ICD-10-CM | POA: Diagnosis not present

## 2016-08-27 DIAGNOSIS — I10 Essential (primary) hypertension: Secondary | ICD-10-CM | POA: Diagnosis not present

## 2016-08-27 DIAGNOSIS — Z853 Personal history of malignant neoplasm of breast: Secondary | ICD-10-CM | POA: Diagnosis not present

## 2016-08-27 DIAGNOSIS — Z79899 Other long term (current) drug therapy: Secondary | ICD-10-CM | POA: Diagnosis not present

## 2016-08-27 LAB — COMPREHENSIVE METABOLIC PANEL
ALBUMIN: 3.7 g/dL (ref 3.5–5.0)
ALT: 12 U/L — ABNORMAL LOW (ref 14–54)
ANION GAP: 8 (ref 5–15)
AST: 22 U/L (ref 15–41)
Alkaline Phosphatase: 49 U/L (ref 38–126)
BILIRUBIN TOTAL: 0.6 mg/dL (ref 0.3–1.2)
BUN: 7 mg/dL (ref 6–20)
CO2: 26 mmol/L (ref 22–32)
Calcium: 10.2 mg/dL (ref 8.9–10.3)
Chloride: 103 mmol/L (ref 101–111)
Creatinine, Ser: 0.85 mg/dL (ref 0.44–1.00)
GFR calc Af Amer: >60 mL/min (ref >60)
GFR calc non Af Amer: >60 mL/min (ref >60)
GLUCOSE: 87 mg/dL (ref 65–99)
POTASSIUM: 4.1 mmol/L (ref 3.5–5.1)
Sodium: 137 mmol/L (ref 135–145)
TOTAL PROTEIN: 6.6 g/dL (ref 6.5–8.1)

## 2016-08-27 LAB — CBC WITH DIFFERENTIAL/PLATELET
BASOS ABS: 0 10*3/uL (ref 0.0–0.1)
Basophils Relative: 0 %
Eosinophils Absolute: 0.1 10*3/uL (ref 0.0–0.7)
Eosinophils Relative: 3 %
HEMATOCRIT: 35.8 % — AB (ref 36.0–46.0)
Hemoglobin: 11.3 g/dL — ABNORMAL LOW (ref 12.0–15.0)
LYMPHS PCT: 29 %
Lymphs Abs: 1.1 10*3/uL (ref 0.7–4.0)
MCH: 28.9 pg (ref 26.0–34.0)
MCHC: 31.6 g/dL (ref 30.0–36.0)
MCV: 91.6 fL (ref 78.0–100.0)
Monocytes Absolute: 0.8 10*3/uL (ref 0.1–1.0)
Monocytes Relative: 21 %
NEUTROS ABS: 1.9 10*3/uL (ref 1.7–7.7)
Neutrophils Relative %: 47 %
Platelets: 244 10*3/uL (ref 150–400)
RBC: 3.91 MIL/uL (ref 3.87–5.11)
RDW: 15.5 % (ref 11.5–15.5)
WBC: 4 10*3/uL (ref 4.0–10.5)

## 2016-08-27 LAB — URINALYSIS, ROUTINE W REFLEX MICROSCOPIC
Bilirubin Urine: NEGATIVE
Glucose, UA: NEGATIVE mg/dL
Hgb urine dipstick: NEGATIVE
KETONES UR: NEGATIVE mg/dL
Nitrite: NEGATIVE
PROTEIN: NEGATIVE mg/dL
Specific Gravity, Urine: 1.003 — ABNORMAL LOW (ref 1.005–1.030)
pH: 7 (ref 5.0–8.0)

## 2016-08-27 LAB — APTT: APTT: 38 s — AB (ref 24–36)

## 2016-08-27 LAB — PROTIME-INR
INR: 2.04
Prothrombin Time: 23.4 seconds — ABNORMAL HIGH (ref 11.4–15.2)

## 2016-08-27 MED ORDER — FLUORESCEIN SODIUM 0.6 MG OP STRP
1.0000 | ORAL_STRIP | Freq: Once | OPHTHALMIC | Status: AC
  Administered 2016-08-27: 1 via OPHTHALMIC
  Filled 2016-08-27: qty 1

## 2016-08-27 MED ORDER — TETRACAINE HCL 0.5 % OP SOLN
2.0000 [drp] | Freq: Once | OPHTHALMIC | Status: AC
  Administered 2016-08-27: 2 [drp] via OPHTHALMIC
  Filled 2016-08-27: qty 2

## 2016-08-27 NOTE — ED Triage Notes (Signed)
Pt states hx of migraines, but has not had a migraine for 5 years.  States began having L sided headache Fri and Sat.  On Fri she also began seeing "floaters" in her L eye. Today she began having tingling in her tongue.

## 2016-08-27 NOTE — Discharge Instructions (Signed)
You have been seen today for visual disturbances. There were no acute abnormalities noted on the CT scan of the head or in the lab results, other than some bacteria noted in the urine. A urine culture will be performed and abnormal results will be called to you, if applicable.  Continued evaluation and management of your visual disturbances should be done by your ophthalmologist.  You have been scheduled for an appointment at Dr. Prudencio Burly' office tomorrow morning (2/20) at 9:30AM. Your eyes will be dilated during this visit so please plan to have someone give you a ride home.   Return to the ED should additional symptoms develop such as weakness on one side of the body, changes in mental status, visual loss, or any other concerning symptoms.

## 2016-08-27 NOTE — ED Provider Notes (Signed)
Medical screening examination/treatment/procedure(s) were conducted as a shared visit with non-physician practitioner(s) and myself.  I personally evaluated the patient during the encounter.  82 year old female here with left-sided floaters. No vision loss. Normal visual acuity. On exam, I's intraocular pressures were normal. I sharply movements are normal. Pupils were equal round reactive. Ultrasound ocular as documented below, was normal. Shawn discussed the case with the patient's ophthalmologist who will see the patient in follow-up however no emergent causes for her symptoms at this time. Doubt CVA.    EMERGENCY DEPARTMENT [place] OCULAR EXAM "Study: Limited Ultrasound of Orbit "  INDICATIONS: Floaters/Flashes Linear probe utilized to obtain images in both long and short axis of the orbit having the patient look left and right if possible.  PERFORMED BY: Myself IMAGES ARCHIVED?: Yes LIMITATIONS: none VIEWS USED: Right orbit and Left orbit INTERPRETATION: No retinal detachment, Lens in proper position, Normal optic nerve diameter L - 3.28mm, R - 4.36mm      Merrily Pew, MD 08/28/16 1439

## 2016-08-27 NOTE — ED Provider Notes (Signed)
[facility] DEPT Provider Note   CSN: [id number] Arrival date & time: 08/27/16  1056     History   Chief Complaint Chief Complaint  Patient presents with  . Loss of Vision  . Headache    HPI Jeanette [name] is a 82 y.o. female.  HPI   Jeanette [name] is a 82 y.o. female, with a history of Breast and anal cancer, CVA, A. fib, and HTN, presenting to the ED with visual disturbance that began three days ago. On Friday, February 16, patient was at rest when she began to see "floaters" in the vision of her left eye. Shortly thereafter she began to have a bilateral, frontal headache, described as a pressure, extending from below her eyes to the frontal region of the head. The floaters are present throughout the visual field of her left eye. Patient maintained a headache and visual disturbance throughout the weekend and into this morning. Patient called her PCPs office this morning and she was told to come to the ED for stroke rule out. Patient has had an intermittent reduction in pain when taking a half dose of Vicodin. Last night, patient then began to have numbness to the front of her tongue bilaterally, but this has since resolved. Current headache is bilateral, starts under the eyes and extends to the frontal region of the head, rates it 2/10, described as a pressure. She states that she is still seen the floaters in her left eye. Also states that when she looks at straight lines, such as in the wallpaper, these lines look wavy and appear to move. She endorses a feeling of unsteadiness that she states may be due to her visual disturbance. Patient states she has had similar visual disturbance in her right eye previously that ended up being a retinal detachment.   Patient denies LOC, weakness, falls/trauma, N/V, eye pain, or any other complaints.       Past Medical History:  Diagnosis Date  . Anal cancer ([place])   . Breast cancer ([place])   . Breast cancer ([place])   . Cancer of breast ([place])    . Hyperlipidemia   . Hypertension   . PVD (peripheral vascular disease) ([place])   . RAS (renal artery stenosis) ([place])   . Stroke [facility])     Patient Active Problem List   Diagnosis Date Noted  . Renal artery stenosis ([place]) 10/21/2013  . Essential hypertension 10/21/2013  . PALPITATIONS, RECURRENT 06/22/2009  . STRESS ELECTROCARDIOGRAM, ABNORMAL 06/22/2009  . SHORTNESS OF BREATH 05/25/2009  . DYSLIPIDEMIA 03/21/2009  . ANEMIA 03/21/2009  . CEREBRAL EMBOLISM WITH CEREBRAL INFARCTION 03/21/2009  . DEGENERATIVE DISC DISEASE 03/21/2009  . CHEST PAIN-UNSPECIFIED 03/21/2009    Past Surgical History:  Procedure Laterality Date  . BREAST LUMPECTOMY    . CARDIOVASCULAR STRESS TEST  07/27/2003   No evidence if LV myocardial ischemia or scar. LV EF 74%.  . OVARIAN CYST REMOVAL    . RENAL DOPPLER  09/09/2012   Right proximal renal artery: 60-99% diameter reduction. Normal patency of the left main renal artery.   . TRANSTHORACIC ECHOCARDIOGRAM  03/22/2006   EF 55-60%, mild mitral valvular regurg, pulmonary artery systolic pressure was moderately increased.  . TUBAL LIGATION      OB History    No data available       Home Medications    Prior to Admission medications   Medication Sig Start Date End Date Taking? Authorizing Provider  amLODipine (NORVASC) 10 MG tablet Take 10 mg by  mouth daily.    Historical Provider, MD  conjugated estrogens (PREMARIN) vaginal cream Place 1 g vaginally every other day.      Historical Provider, MD  escitalopram (LEXAPRO) 10 MG tablet Take 10 mg by mouth daily.      Historical Provider, MD  gabapentin (NEURONTIN) 100 MG capsule Take 100 mg by mouth daily.      Historical Provider, MD  meclizine (ANTIVERT) 25 MG tablet Take 25 mg by mouth daily as needed. Dizzy.      Historical Provider, MD  metoprolol (TOPROL-XL) 200 MG 24 hr tablet Take 200 mg by mouth daily.      Historical Provider, MD  olmesartan (BENICAR) 20 MG tablet Take 10 mg by mouth 2 (two)  times daily.     Historical Provider, MD  vitamin B-12 (CYANOCOBALAMIN) 100 MCG tablet Take 50 mcg by mouth daily.      Historical Provider, MD  warfarin (COUMADIN) 5 MG tablet Take 5 mg by mouth daily. Take half tab on mon wed and fri and whole tab rest of days.     Historical Provider, MD    Family History No family history on file.  Social History Social History  Substance Use Topics  . Smoking status: Never Smoker  . Smokeless tobacco: Never Used  . Alcohol use Yes     Comment: occ     Allergies   Benazepril hcl; Epinephrine; Lisinopril; and Vytorin [ezetimibe-simvastatin]   Review of Systems Review of Systems  Constitutional: Negative for chills, diaphoresis and fever.  HENT: Negative for facial swelling.   Eyes: Positive for visual disturbance. Negative for pain.  Respiratory: Negative for shortness of breath.   Cardiovascular: Negative for chest pain.  Gastrointestinal: Negative for abdominal pain, nausea and vomiting.  Genitourinary: Negative for dysuria and frequency.  Neurological: Positive for headaches. Negative for dizziness, syncope, speech difficulty, weakness, light-headedness and numbness.  All other systems reviewed and are negative.    Physical Exam Updated Vital Signs BP 127/72 (BP Location: Right Arm)   Pulse 69   Temp 98.6 F (37 C) (Oral)   Resp 16   Ht 5\' 3"  (1.6 m)   Wt 71.7 kg   SpO2 98%   BMI 27.99 kg/m   Physical Exam  Constitutional: She is oriented to person, place, and time. She appears well-developed and well-nourished. No distress.  HENT:  Head: Normocephalic and atraumatic.  Mouth/Throat: Oropharynx is clear and moist.  No tenderness or swelling to the face or scalp.  Eyes: Conjunctivae and EOM are normal. Pupils are equal, round, and reactive to light.  No pain with EOMs.  No contact lenses in place.  Woods Lamp exam shows no increased uptake of fluorescein. Slit lamp exam was also performed with no noted signs of corneal  abrasion or ulcer, iritis, anterior chamber damage, foreign bodies, or globe damage.  Tono-Pen values: Right eye: 14  Left eye: 14    Visual Acuity  Right Eye Distance: 20/20 Left Eye Distance: 20/50 Bilateral Distance: 20/20  Right Eye Near:   Left Eye Near:    Bilateral Near:     Neck: Normal range of motion. Neck supple.  Cardiovascular: Normal rate, regular rhythm, normal heart sounds and intact distal pulses.   Pulmonary/Chest: Effort normal and breath sounds normal. No respiratory distress.  Abdominal: Soft. There is no tenderness. There is no guarding.  Musculoskeletal: She exhibits no edema.  Lymphadenopathy:    She has no cervical adenopathy.  Neurological: She is alert and oriented  to person, place, and time.  No sensory deficits. Strength 5/5 in all extremities. No gait disturbance. Coordination intact including heel to shin and finger to nose. Cranial nerves III-XII grossly intact. No facial droop. No upright ataxia. HINTS exam reassuring.   Skin: Skin is warm and dry. She is not diaphoretic.  Psychiatric: She has a normal mood and affect. Her behavior is normal.  Nursing note and vitals reviewed.    ED Treatments / Results  Labs (all labs ordered are listed, but only abnormal results are displayed) Labs Reviewed  COMPREHENSIVE METABOLIC PANEL - Abnormal; Notable for the following:       Result Value   ALT 12 (*)    All other components within normal limits  CBC WITH DIFFERENTIAL/PLATELET - Abnormal; Notable for the following:    Hemoglobin 11.3 (*)    HCT 35.8 (*)    All other components within normal limits  URINALYSIS, ROUTINE W REFLEX MICROSCOPIC - Abnormal; Notable for the following:    Color, Urine STRAW (*)    Specific Gravity, Urine 1.003 (*)    Leukocytes, UA LARGE (*)    Bacteria, UA MANY (*)    Squamous Epithelial / LPF 0-5 (*)    All other components within normal limits  PROTIME-INR - Abnormal; Notable for the following:    Prothrombin Time 23.4  (*)    All other components within normal limits  APTT - Abnormal; Notable for the following:    aPTT 38 (*)    All other components within normal limits  URINE CULTURE    EKG  EKG Interpretation None       Radiology Ct Head Wo Contrast  Result Date: 08/27/2016 CLINICAL DATA:  Headache and blurred vision. History of breast and anal carcinoma EXAM: CT HEAD WITHOUT CONTRAST TECHNIQUE: Contiguous axial images were obtained from the base of the skull through the vertex without intravenous contrast. COMPARISON:  Head CT May 31, 2011 and brain MRI June 01, 2011 FINDINGS: Brain: There is mild diffuse atrophy, stable. There is a cavum septum pellucidum, an anatomic variant. There is no intracranial mass, hemorrhage, extra-axial fluid collection, or midline shift. There is evidence of a prior infarct at the left frontal-parietal junction, stable. Elsewhere there is mild patchy small vessel disease in the centra semiovale bilaterally. No new gray-white compartment lesions are identified. No evident acute infarct. Vascular: No hyperdense vessel. There is calcification in each carotid siphon region. Skull: Bony calvarium appears intact. Sinuses/Orbits: There is incomplete visualization of a retention cyst in the posterior inferior left maxillary antrum. There is mild mucosal thickening in several ethmoid air cells bilaterally. There is subtle patchy opacity in the posterior left sphenoid sinus. Other visualized paranasal sinuses are clear. Patient has had cataract removal on the right. Orbits otherwise appear symmetric bilaterally. Other: Mastoid air cells are clear. IMPRESSION: Atrophy with periventricular small vessel disease. Prior infarct at the left frontal-parietal junction, unchanged. No acute infarct. No mass, hemorrhage, or extra-axial fluid collection. Foci of arterial vascular calcification noted. Areas of paranasal sinus disease at several sites. Electronically Signed   By: Lowella Grip III M.D.   On: 08/27/2016 13:58    Procedures Procedures (including critical care time)  Medications Ordered in ED Medications  tetracaine (PONTOCAINE) 0.5 % ophthalmic solution 2 drop (2 drops Both Eyes Given by Other 08/27/16 1509)  fluorescein ophthalmic strip 1 strip (1 strip Left Eye Given by Other 08/27/16 1508)     Initial Impression / Assessment and Plan / ED  Course  I have reviewed the triage vital signs and the nursing notes.  Pertinent labs & imaging results that were available during my care of the patient were reviewed by me and considered in my medical decision making (see chart for details).     Patient presents with visual disturbances the left eye. I do not think the patient's presentation is consistent with stroke. She has no vision loss. She has no neurologic deficits on exam. Normal eye exam. Dr. Dayna Barker and I performed a bedside ocular ultrasound with no evidence of retinal tear or detachment. There is a visual acuity difference noted in the left eye. Patient denies that she has a deficit.  2:53 PM Spoke with Dr. Prudencio Burly, patient's ophthalmologist, who agrees with the workup already performed. States that her visual acuity results noted today are consistent with results he has on file for her latest eye exam. Recommends follow up in the office. Scheduled her an appointment for tomorrow morning at 9:30AM.  This information was communicated with the patient. Return precautions were discussed. Patient voices understanding of all instructions and is comfortable with discharge.  Bacteruria noted on UA. Pt denies urinary symptoms. Urine culture added. ABX will be added, if necessary, at that time.   Findings and plan of care discussed with Merrily Pew, MD. Dr. Dayna Barker personally evaluated and examined this patient.   Vitals:   08/27/16 1145 08/27/16 1230 08/27/16 1330 08/27/16 1509  BP: 139/74 131/73 153/83 147/81  Pulse: 62  66 (!) 56  Resp:  14 16 18   Temp:     97.8 F (36.6 C)  TempSrc:    Oral  SpO2:   100% 100%  Weight:      Height:         Final Clinical Impressions(s) / ED Diagnoses   Final diagnoses:  Visual disturbance    New Prescriptions Discharge Medication List as of 08/27/2016  2:59 PM       Lorayne Bender, PA-C 08/27/16 [place], MD 08/28/16 1439

## 2016-08-30 ENCOUNTER — Emergency Department (HOSPITAL_COMMUNITY): Payer: Medicare Other

## 2016-08-30 ENCOUNTER — Emergency Department (HOSPITAL_COMMUNITY)
Admission: EM | Admit: 2016-08-30 | Discharge: 2016-08-30 | Disposition: A | Discharge status: Home / Self Care | Payer: Medicare Other | Attending: Emergency Medicine | Admitting: Emergency Medicine

## 2016-08-30 ENCOUNTER — Encounter (HOSPITAL_COMMUNITY): Payer: Self-pay | Admitting: Emergency Medicine

## 2016-08-30 DIAGNOSIS — Z8673 Personal history of transient ischemic attack (TIA), and cerebral infarction without residual deficits: Secondary | ICD-10-CM | POA: Insufficient documentation

## 2016-08-30 DIAGNOSIS — S93409A Sprain of unspecified ligament of unspecified ankle, initial encounter: Secondary | ICD-10-CM

## 2016-08-30 DIAGNOSIS — Z85048 Personal history of other malignant neoplasm of rectum, rectosigmoid junction, and anus: Secondary | ICD-10-CM | POA: Insufficient documentation

## 2016-08-30 DIAGNOSIS — Z7901 Long term (current) use of anticoagulants: Secondary | ICD-10-CM | POA: Insufficient documentation

## 2016-08-30 DIAGNOSIS — Z853 Personal history of malignant neoplasm of breast: Secondary | ICD-10-CM | POA: Insufficient documentation

## 2016-08-30 DIAGNOSIS — Y999 Unspecified external cause status: Secondary | ICD-10-CM | POA: Diagnosis not present

## 2016-08-30 DIAGNOSIS — Y939 Activity, unspecified: Secondary | ICD-10-CM | POA: Diagnosis not present

## 2016-08-30 DIAGNOSIS — X58XXXA Exposure to other specified factors, initial encounter: Secondary | ICD-10-CM | POA: Insufficient documentation

## 2016-08-30 DIAGNOSIS — I10 Essential (primary) hypertension: Secondary | ICD-10-CM | POA: Insufficient documentation

## 2016-08-30 DIAGNOSIS — Y929 Unspecified place or not applicable: Secondary | ICD-10-CM | POA: Diagnosis not present

## 2016-08-30 DIAGNOSIS — S93401A Sprain of unspecified ligament of right ankle, initial encounter: Secondary | ICD-10-CM | POA: Diagnosis not present

## 2016-08-30 DIAGNOSIS — S99911A Unspecified injury of right ankle, initial encounter: Secondary | ICD-10-CM | POA: Diagnosis present

## 2016-08-30 LAB — URINE CULTURE
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
## 2016-08-31 ENCOUNTER — Telehealth (HOSPITAL_BASED_OUTPATIENT_CLINIC_OR_DEPARTMENT_OTHER): Payer: Self-pay

## 2016-08-31 NOTE — Progress Notes (Signed)
ED Antimicrobial Stewardship Positive Culture Follow Up   Jeanette [name] is an 82 y.o. female who presented to [facility] on 08/27/2016 with a chief complaint of  Chief Complaint  Patient presents with  . Loss of Vision  . Headache    Recent Results (from the past 720 hour(s))  Urine culture     Status: Abnormal   Collection Time: 08/27/16  1:09 PM  Result Value Ref Range Status   Specimen Description URINE, CLEAN CATCH  Final   Special Requests NONE  Final   Culture >=100,000 COLONIES/mL VIRIDANS STREPTOCOCCUS (A)  Final   Report Status 08/30/2016 FINAL  Final    Asymptomatic bacteruria. No treatment necessary  ED Provider: Joseph Berkshire MD   Reginia Naas 08/31/2016, 9:05 AM Infectious Diseases Pharmacist Phone# [phone]

## 2016-08-31 NOTE — Telephone Encounter (Signed)
Post ED Visit - Positive Culture Follow-up  Culture report reviewed by antimicrobial stewardship pharmacist:  [x]  Elenor Quinones, Pharm.D. []  Heide Guile, Pharm.D., BCPS []  Parks Neptune, Pharm.D. []  Alycia Rossetti, Pharm.D., BCPS []  [place], Pharm.D., BCPS, AAHIVP []  Legrand Como, Pharm.D., BCPS, AAHIVP []  Milus Glazier, Pharm.D. []  Rob Evette Doffing, Pharm.D.  Positive urine culture, >/= 100,000 colonies -> Viridans Streptococcus  Chart reviewed by Dr Betsey Holiday "No antibiotics"   Dortha Kern 08/31/2016, 12:22 PM

## 2016-10-03 ENCOUNTER — Other Ambulatory Visit: Payer: Self-pay | Admitting: Obstetrics & Gynecology

## 2016-10-03 ENCOUNTER — Other Ambulatory Visit: Payer: Self-pay | Admitting: Internal Medicine

## 2016-10-03 DIAGNOSIS — Z1231 Encounter for screening mammogram for malignant neoplasm of breast: Secondary | ICD-10-CM

## 2016-10-30 ENCOUNTER — Ambulatory Visit
Admission: RE | Admit: 2016-10-30 | Discharge: 2016-10-30 | Disposition: A | Discharge status: Home / Self Care | Payer: Medicare Other | Source: Ambulatory Visit | Attending: Internal Medicine | Admitting: Internal Medicine

## 2016-10-30 DIAGNOSIS — Z1231 Encounter for screening mammogram for malignant neoplasm of breast: Secondary | ICD-10-CM

## 2017-01-15 ENCOUNTER — Other Ambulatory Visit: Payer: Self-pay | Admitting: Internal Medicine

## 2017-01-15 DIAGNOSIS — R822 Biliuria: Secondary | ICD-10-CM

## 2017-01-18 ENCOUNTER — Ambulatory Visit
Admission: RE | Admit: 2017-01-18 | Discharge: 2017-01-18 | Disposition: A | Discharge status: Home / Self Care | Payer: Medicare Other | Source: Ambulatory Visit | Attending: Internal Medicine | Admitting: Internal Medicine

## 2017-01-18 DIAGNOSIS — R822 Biliuria: Secondary | ICD-10-CM

## 2017-02-11 ENCOUNTER — Other Ambulatory Visit: Payer: Self-pay | Admitting: Orthopedic Surgery

## 2017-02-13 ENCOUNTER — Encounter (HOSPITAL_BASED_OUTPATIENT_CLINIC_OR_DEPARTMENT_OTHER): Payer: Self-pay | Admitting: *Deleted

## 2017-02-13 NOTE — Progress Notes (Signed)
Patient's LD Coumadin 02-13-17. She will come in Monday 02-18-17 for PT/INR and EKG.

## 2017-02-18 ENCOUNTER — Encounter (HOSPITAL_BASED_OUTPATIENT_CLINIC_OR_DEPARTMENT_OTHER)
Admission: RE | Admit: 2017-02-18 | Discharge: 2017-02-18 | Disposition: A | Discharge status: Home / Self Care | Payer: Medicare Other | Source: Ambulatory Visit | Attending: Orthopedic Surgery | Admitting: Orthopedic Surgery

## 2017-02-18 DIAGNOSIS — S52572A Other intraarticular fracture of lower end of left radius, initial encounter for closed fracture: Secondary | ICD-10-CM | POA: Diagnosis not present

## 2017-02-18 DIAGNOSIS — Z7901 Long term (current) use of anticoagulants: Secondary | ICD-10-CM | POA: Diagnosis not present

## 2017-02-18 DIAGNOSIS — I4891 Unspecified atrial fibrillation: Secondary | ICD-10-CM | POA: Diagnosis not present

## 2017-02-18 DIAGNOSIS — Z8673 Personal history of transient ischemic attack (TIA), and cerebral infarction without residual deficits: Secondary | ICD-10-CM | POA: Diagnosis not present

## 2017-02-18 DIAGNOSIS — F419 Anxiety disorder, unspecified: Secondary | ICD-10-CM | POA: Diagnosis not present

## 2017-02-18 DIAGNOSIS — E785 Hyperlipidemia, unspecified: Secondary | ICD-10-CM | POA: Diagnosis not present

## 2017-02-18 DIAGNOSIS — S52502A Unspecified fracture of the lower end of left radius, initial encounter for closed fracture: Secondary | ICD-10-CM | POA: Diagnosis present

## 2017-02-18 DIAGNOSIS — W19XXXA Unspecified fall, initial encounter: Secondary | ICD-10-CM | POA: Diagnosis not present

## 2017-02-18 DIAGNOSIS — F329 Major depressive disorder, single episode, unspecified: Secondary | ICD-10-CM | POA: Diagnosis not present

## 2017-02-18 DIAGNOSIS — I739 Peripheral vascular disease, unspecified: Secondary | ICD-10-CM | POA: Diagnosis not present

## 2017-02-18 DIAGNOSIS — I10 Essential (primary) hypertension: Secondary | ICD-10-CM | POA: Diagnosis not present

## 2017-02-18 DIAGNOSIS — Z79899 Other long term (current) drug therapy: Secondary | ICD-10-CM | POA: Diagnosis not present

## 2017-02-18 LAB — PROTIME-INR
INR: 1.18
Prothrombin Time: 15.1 seconds (ref 11.4–15.2)

## 2017-02-19 ENCOUNTER — Encounter (HOSPITAL_BASED_OUTPATIENT_CLINIC_OR_DEPARTMENT_OTHER): Payer: Self-pay | Admitting: Anesthesiology

## 2017-02-19 ENCOUNTER — Encounter (HOSPITAL_BASED_OUTPATIENT_CLINIC_OR_DEPARTMENT_OTHER)
Admission: RE | Disposition: A | Discharge status: Home / Self Care | Payer: Self-pay | Source: Ambulatory Visit | Attending: Orthopedic Surgery

## 2017-02-19 ENCOUNTER — Ambulatory Visit (HOSPITAL_BASED_OUTPATIENT_CLINIC_OR_DEPARTMENT_OTHER): Payer: Medicare Other | Admitting: Anesthesiology

## 2017-02-19 ENCOUNTER — Ambulatory Visit (HOSPITAL_BASED_OUTPATIENT_CLINIC_OR_DEPARTMENT_OTHER)
Admission: RE | Admit: 2017-02-19 | Discharge: 2017-02-19 | Disposition: A | Discharge status: Home / Self Care | Payer: Medicare Other | Source: Ambulatory Visit | Attending: Orthopedic Surgery | Admitting: Orthopedic Surgery

## 2017-02-19 DIAGNOSIS — I739 Peripheral vascular disease, unspecified: Secondary | ICD-10-CM | POA: Diagnosis not present

## 2017-02-19 DIAGNOSIS — S52572A Other intraarticular fracture of lower end of left radius, initial encounter for closed fracture: Secondary | ICD-10-CM | POA: Diagnosis not present

## 2017-02-19 DIAGNOSIS — Z7901 Long term (current) use of anticoagulants: Secondary | ICD-10-CM | POA: Insufficient documentation

## 2017-02-19 DIAGNOSIS — Z8673 Personal history of transient ischemic attack (TIA), and cerebral infarction without residual deficits: Secondary | ICD-10-CM | POA: Insufficient documentation

## 2017-02-19 DIAGNOSIS — I4891 Unspecified atrial fibrillation: Secondary | ICD-10-CM | POA: Insufficient documentation

## 2017-02-19 DIAGNOSIS — W19XXXA Unspecified fall, initial encounter: Secondary | ICD-10-CM | POA: Insufficient documentation

## 2017-02-19 DIAGNOSIS — F419 Anxiety disorder, unspecified: Secondary | ICD-10-CM | POA: Insufficient documentation

## 2017-02-19 DIAGNOSIS — I10 Essential (primary) hypertension: Secondary | ICD-10-CM | POA: Diagnosis not present

## 2017-02-19 DIAGNOSIS — E785 Hyperlipidemia, unspecified: Secondary | ICD-10-CM | POA: Insufficient documentation

## 2017-02-19 DIAGNOSIS — F329 Major depressive disorder, single episode, unspecified: Secondary | ICD-10-CM | POA: Insufficient documentation

## 2017-02-19 DIAGNOSIS — Z79899 Other long term (current) drug therapy: Secondary | ICD-10-CM | POA: Insufficient documentation

## 2017-02-19 HISTORY — PX: OPEN REDUCTION INTERNAL FIXATION (ORIF) DISTAL RADIAL FRACTURE: SHX5989

## 2017-02-19 HISTORY — DX: Cardiac arrhythmia, unspecified: I49.9

## 2017-02-19 HISTORY — DX: Unspecified osteoarthritis, unspecified site: M19.90

## 2017-02-19 HISTORY — DX: Depression, unspecified: F32.A

## 2017-02-19 HISTORY — DX: Anxiety disorder, unspecified: F41.9

## 2017-02-19 HISTORY — DX: Major depressive disorder, single episode, unspecified: F32.9

## 2017-02-19 SURGERY — OPEN REDUCTION INTERNAL FIXATION (ORIF) DISTAL RADIUS FRACTURE
Anesthesia: General | Site: Wrist | Laterality: Left

## 2017-02-19 MED ORDER — FENTANYL CITRATE (PF) 100 MCG/2ML IJ SOLN
INTRAMUSCULAR | Status: AC
  Filled 2017-02-19: qty 2

## 2017-02-19 MED ORDER — EPHEDRINE SULFATE-NACL 50-0.9 MG/10ML-% IV SOSY
PREFILLED_SYRINGE | INTRAVENOUS | Status: DC | PRN
  Administered 2017-02-19: 10 mg via INTRAVENOUS

## 2017-02-19 MED ORDER — LIDOCAINE 2% (20 MG/ML) 5 ML SYRINGE
INTRAMUSCULAR | Status: AC
Start: 2017-02-19 — End: 2017-02-19
  Filled 2017-02-19: qty 5

## 2017-02-19 MED ORDER — ONDANSETRON HCL 4 MG/2ML IJ SOLN
INTRAMUSCULAR | Status: DC | PRN
  Administered 2017-02-19: 4 mg via INTRAVENOUS

## 2017-02-19 MED ORDER — LIDOCAINE 2% (20 MG/ML) 5 ML SYRINGE
INTRAMUSCULAR | Status: DC | PRN
  Administered 2017-02-19: 80 mg via INTRAVENOUS

## 2017-02-19 MED ORDER — MIDAZOLAM HCL 2 MG/2ML IJ SOLN: 1.0000 mg | INTRAMUSCULAR | Status: DC | PRN

## 2017-02-19 MED ORDER — PROPOFOL 10 MG/ML IV BOLUS
INTRAVENOUS | Status: DC | PRN
  Administered 2017-02-19: 40 mg via INTRAVENOUS
  Administered 2017-02-19: 80 mg via INTRAVENOUS

## 2017-02-19 MED ORDER — CEFAZOLIN SODIUM-DEXTROSE 2-4 GM/100ML-% IV SOLN
2.0000 g | INTRAVENOUS | Status: AC
  Administered 2017-02-19: 2 g via INTRAVENOUS

## 2017-02-19 MED ORDER — BUPIVACAINE-EPINEPHRINE (PF) 0.5% -1:200000 IJ SOLN
INTRAMUSCULAR | Status: DC | PRN
  Administered 2017-02-19: 20 mL via PERINEURAL

## 2017-02-19 MED ORDER — OXYCODONE-ACETAMINOPHEN 5-325 MG PO TABS: ORAL_TABLET | ORAL | 0 refills | Status: DC

## 2017-02-19 MED ORDER — LACTATED RINGERS IV SOLN
INTRAVENOUS | Status: DC
  Administered 2017-02-19: 13:00:00 via INTRAVENOUS

## 2017-02-19 MED ORDER — FENTANYL CITRATE (PF) 100 MCG/2ML IJ SOLN
50.0000 ug | INTRAMUSCULAR | Status: DC | PRN
  Administered 2017-02-19: 25 ug via INTRAVENOUS
  Administered 2017-02-19: 50 ug via INTRAVENOUS

## 2017-02-19 MED ORDER — MIDAZOLAM HCL 2 MG/2ML IJ SOLN
INTRAMUSCULAR | Status: AC
  Filled 2017-02-19: qty 2

## 2017-02-19 MED ORDER — CHLORHEXIDINE GLUCONATE 4 % EX LIQD: 60.0000 mL | Freq: Once | CUTANEOUS | Status: DC

## 2017-02-19 MED ORDER — CEFAZOLIN SODIUM-DEXTROSE 2-4 GM/100ML-% IV SOLN
INTRAVENOUS | Status: AC
  Filled 2017-02-19: qty 100

## 2017-02-19 MED ORDER — SCOPOLAMINE 1 MG/3DAYS TD PT72: 1.0000 | MEDICATED_PATCH | Freq: Once | TRANSDERMAL | Status: DC | PRN

## 2017-02-19 MED ORDER — DEXAMETHASONE SODIUM PHOSPHATE 4 MG/ML IJ SOLN
INTRAMUSCULAR | Status: DC | PRN
  Administered 2017-02-19: 10 mg via INTRAVENOUS

## 2017-02-19 MED ORDER — EPHEDRINE 5 MG/ML INJ
INTRAVENOUS | Status: AC
Start: 2017-02-19 — End: 2017-02-19
  Filled 2017-02-19: qty 10

## 2017-02-19 MED ORDER — DEXAMETHASONE SODIUM PHOSPHATE 10 MG/ML IJ SOLN
INTRAMUSCULAR | Status: AC
  Filled 2017-02-19: qty 1

## 2017-02-19 MED ORDER — ONDANSETRON HCL 4 MG/2ML IJ SOLN
INTRAMUSCULAR | Status: AC
  Filled 2017-02-19: qty 2

## 2017-02-19 SURGICAL SUPPLY — 65 items
BANDAGE ACE 3X5.8 VEL STRL LF (GAUZE/BANDAGES/DRESSINGS) ×2 IMPLANT
BIT DRILL 2.0 LNG QUCK RELEASE (BIT) IMPLANT
BIT DRILL 2.8 QUICK RELEASE (BIT) IMPLANT
BLADE SURG 15 STRL LF DISP TIS (BLADE) ×2 IMPLANT
BLADE SURG 15 STRL SS (BLADE) ×4
BNDG CMPR 9X4 STRL LF SNTH (GAUZE/BANDAGES/DRESSINGS) ×1
BNDG ESMARK 4X9 LF (GAUZE/BANDAGES/DRESSINGS) ×2 IMPLANT
BNDG GAUZE ELAST 4 BULKY (GAUZE/BANDAGES/DRESSINGS) ×2 IMPLANT
BNDG PLASTER X FAST 3X3 WHT LF (CAST SUPPLIES) ×20 IMPLANT
BNDG PLSTR 9X3 FST ST WHT (CAST SUPPLIES) ×10
BONE CHIP PRESERV 5CC PCAN5 (Bone Implant) ×2 IMPLANT
CHLORAPREP W/TINT 26ML (MISCELLANEOUS) ×2 IMPLANT
CORD BIPOLAR FORCEPS 12FT (ELECTRODE) ×2 IMPLANT
COVER BACK TABLE 60X90IN (DRAPES) ×2 IMPLANT
COVER MAYO STAND STRL (DRAPES) ×2 IMPLANT
CUFF TOURNIQUET SINGLE 18IN (TOURNIQUET CUFF) ×1 IMPLANT
CUFF TOURNIQUET SINGLE 24IN (TOURNIQUET CUFF) IMPLANT
DRAPE EXTREMITY T 121X128X90 (DRAPE) ×2 IMPLANT
DRAPE OEC MINIVIEW 54X84 (DRAPES) ×2 IMPLANT
DRAPE SURG 17X23 STRL (DRAPES) ×2 IMPLANT
DRILL 2.0 LNG QUICK RELEASE (BIT) ×2
DRILL 2.8 QUICK RELEASE (BIT) ×2
GAUZE SPONGE 4X4 12PLY STRL (GAUZE/BANDAGES/DRESSINGS) ×2 IMPLANT
GAUZE XEROFORM 1X8 LF (GAUZE/BANDAGES/DRESSINGS) ×2 IMPLANT
GLOVE BIO SURGEON STRL SZ7.5 (GLOVE) ×2 IMPLANT
GLOVE BIOGEL PI IND STRL 7.0 (GLOVE) IMPLANT
GLOVE BIOGEL PI IND STRL 8 (GLOVE) ×1 IMPLANT
GLOVE BIOGEL PI IND STRL 8.5 (GLOVE) IMPLANT
GLOVE BIOGEL PI INDICATOR 7.0 (GLOVE) ×2
GLOVE BIOGEL PI INDICATOR 8 (GLOVE) ×1
GLOVE BIOGEL PI INDICATOR 8.5 (GLOVE)
GLOVE ECLIPSE 6.5 STRL STRAW (GLOVE) ×1 IMPLANT
GLOVE SURG ORTHO 8.0 STRL STRW (GLOVE) IMPLANT
GOWN STRL REUS W/ TWL LRG LVL3 (GOWN DISPOSABLE) ×1 IMPLANT
GOWN STRL REUS W/TWL LRG LVL3 (GOWN DISPOSABLE) ×2
GOWN STRL REUS W/TWL XL LVL3 (GOWN DISPOSABLE) ×2 IMPLANT
GRAFT BNE CANC CHIPS 1-8 5CC (Bone Implant) IMPLANT
GUIDEWIRE ORTHO 0.054X6 (WIRE) ×3 IMPLANT
NDL HYPO 25X1 1.5 SAFETY (NEEDLE) IMPLANT
NEEDLE HYPO 25X1 1.5 SAFETY (NEEDLE) IMPLANT
NS IRRIG 1000ML POUR BTL (IV SOLUTION) ×2 IMPLANT
PACK BASIN DAY SURGERY FS (CUSTOM PROCEDURE TRAY) ×2 IMPLANT
PAD CAST 3X4 CTTN HI CHSV (CAST SUPPLIES) ×1 IMPLANT
PADDING CAST COTTON 3X4 STRL (CAST SUPPLIES) ×2
PLATE ACU LOC PROX STD LEFT (Plate) ×1 IMPLANT
SCREW ACTK 2 NL HEX 3.5.11 (Screw) ×1 IMPLANT
SCREW CORT FT 18X2.3XLCK HEX (Screw) IMPLANT
SCREW CORT FT 20X2.3XLCK HEX (Screw) IMPLANT
SCREW CORTICAL LOCKING 2.3X18M (Screw) ×8 IMPLANT
SCREW CORTICAL LOCKING 2.3X20M (Screw) ×4 IMPLANT
SCREW FX18X2.3XSMTH LCK NS CRT (Screw) IMPLANT
SCREW FX20X2.3XSMTH LCK NS CRT (Screw) IMPLANT
SCREW NLCKG 13 3.5X13 HEXA (Screw) IMPLANT
SCREW NON-LOCK 3.5X13 (Screw) ×2 IMPLANT
SCREW NONLOCK HEX 3.5X12 (Screw) ×1 IMPLANT
SLEEVE SCD COMPRESS KNEE MED (MISCELLANEOUS) ×1 IMPLANT
SLING ARM FOAM STRAP LRG (SOFTGOODS) ×1 IMPLANT
STOCKINETTE 4X48 STRL (DRAPES) ×2 IMPLANT
SUT ETHILON 4 0 PS 2 18 (SUTURE) ×3 IMPLANT
SUT VICRYL 4-0 PS2 18IN ABS (SUTURE) ×2 IMPLANT
SYR BULB 3OZ (MISCELLANEOUS) ×2 IMPLANT
SYR CONTROL 10ML LL (SYRINGE) IMPLANT
TOWEL OR 17X24 6PK STRL BLUE (TOWEL DISPOSABLE) ×4 IMPLANT
TOWEL OR NON WOVEN STRL DISP B (DISPOSABLE) ×2 IMPLANT
UNDERPAD 30X30 (UNDERPADS AND DIAPERS) ×1 IMPLANT

## 2017-02-19 NOTE — H&P (Signed)
Jeanette [name] is an 82 y.o. female.   Chief Complaint: left distal radius fracture HPI: 82 yo fell 12 days ago injuring left wrist.  Seen at ED where XR revealed distal radius fracture.  Splinted and followed up in office.  She wishes to proceed with operative fixation of the fracture.  Allergies:  Allergies  Allergen Reactions  . Benazepril Hcl     cough  . Epinephrine     Shortness of breath and increased heart rate  . Lisinopril     Cough  . Vytorin [Ezetimibe-Simvastatin]     Past Medical History:  Diagnosis Date  . Anal cancer ([place])   . Anxiety   . Arthritis   . Breast cancer ([place])   . Breast cancer ([place])   . Cancer of breast ([place])   . Depression   . Dysrhythmia 2010   a-fib  . Hyperlipidemia   . Hypertension   . PVD (peripheral vascular disease) ([place])   . RAS (renal artery stenosis) ([place])   . Stroke [facility])    no residual    Past Surgical History:  Procedure Laterality Date  . BREAST BIOPSY Left 01/14/2003  . BREAST LUMPECTOMY Right 1989  . CARDIOVASCULAR STRESS TEST  07/27/2003   No evidence if LV myocardial ischemia or scar. LV EF 74%.  . OVARIAN CYST REMOVAL    . RENAL DOPPLER  09/09/2012   Right proximal renal artery: 60-99% diameter reduction. Normal patency of the left main renal artery.   . TRANSTHORACIC ECHOCARDIOGRAM  03/22/2006   EF 55-60%, mild mitral valvular regurg, pulmonary artery systolic pressure was moderately increased.  . TUBAL LIGATION      Family History: History reviewed. No pertinent family history.  Social History:   reports that she has never smoked. She has never used smokeless tobacco. She reports that she drinks alcohol. She reports that she does not use drugs.  Medications: Medications Prior to Admission  Medication Sig Dispense Refill  . cholecalciferol (VITAMIN D) 1000 units tablet Take 1,000 Units by mouth daily.    . Coenzyme Q10 (COQ10 PO) Take 1 capsule by mouth daily.    Marland Kitchen escitalopram (LEXAPRO) 10 MG tablet Take 10  mg by mouth daily.      . metoprolol (TOPROL-XL) 200 MG 24 hr tablet Take 200 mg by mouth daily.      Marland Kitchen olmesartan (BENICAR) 20 MG tablet Take 10 mg by mouth daily.     Marland Kitchen oxyCODONE-acetaminophen (PERCOCET/ROXICET) 5-325 MG tablet Take by mouth every 4 (four) hours as needed for severe pain.    . vitamin B-12 (CYANOCOBALAMIN) 100 MCG tablet Take 50 mcg by mouth daily.      Marland Kitchen warfarin (COUMADIN) 5 MG tablet Take 2.5-5 mg by mouth daily. Take 1 tablet on Monday, Wednesday, and Friday. Take 1/2 tablet all other days      Results for orders placed or performed during the hospital encounter of 02/19/17 (from the past 48 hour(s))  PT- INR at PAT visit (Pre-admission Testing)     Status: None   Collection Time: 02/18/17 11:30 AM  Result Value Ref Range   Prothrombin Time 15.1 11.4 - 15.2 seconds   INR 1.18     No results found.   A comprehensive review of systems was negative except for: Gastrointestinal: positive for nausea  Blood pressure (!) 141/70, pulse 64, temperature 97.8 F (36.6 C), resp. rate 13, height 5\' 4"  (1.626 m), weight 71.7 kg (158 lb), SpO2 96 %.  General appearance: alert,  cooperative and appears stated age Head: Normocephalic, without obvious abnormality, atraumatic Neck: supple, symmetrical, trachea midline Resp: clear to auscultation bilaterally Cardio: regular rate and rhythm GI: non-tender Extremities: Intact sensation and capillary refill all digits.  +epl/fpl/io.  No wounds. Pulses: 2+ and symmetric Skin: Skin color, texture, turgor normal. No rashes or lesions Neurologic: Grossly normal Incision/Wound:none  Assessment/Plan Left distal radius fracture.  Non operative and operative treatment options were discussed with the patient and patient wishes to proceed with operative treatment. Risks, benefits, and alternatives of surgery were discussed and the patient agrees with the plan of care.   Leven Hoel R 02/19/2017, 12:47 PM

## 2017-02-19 NOTE — Anesthesia Procedure Notes (Addendum)
Anesthesia Regional Block: Supraclavicular block   Pre-Anesthetic Checklist: ,, timeout performed, Correct Patient, Correct Site, Correct Laterality, Correct Procedure, Correct Position, site marked, Risks and benefits discussed,  Surgical consent,  Pre-op evaluation,  At surgeon's request and post-op pain management  Laterality: Left  Prep: chloraprep       Needles:  Injection technique: Single-shot  Needle Type: Echogenic Needle     Needle Length: 9cm  Needle Gauge: 21     Additional Needles:   Procedures: ultrasound guided,,,,,,,,  Narrative:  Start time: 02/19/2017 12:53 PM End time: 02/19/2017 12:58 PM Injection made incrementally with aspirations every 5 mL.  Performed by: Personally  Anesthesiologist: [name] Gravel  Additional Notes: No pain on injection. No increased resistance to injection. Injection made in 5cc increments.  Good needle visualization.  Patient tolerated procedure well.

## 2017-02-19 NOTE — Transfer of Care (Signed)
Immediate Anesthesia Transfer of Care Note  Patient: Jeanette [name]  Procedure(s) Performed: Procedure(s): OPEN REDUCTION INTERNAL FIXATION (ORIF) LEFT DISTAL RADIAL FRACTURE (Left)  Patient Location: PACU  Anesthesia Type:General and GA combined with regional for post-op pain  Level of Consciousness: sedated  Airway & Oxygen Therapy: Patient Spontanous Breathing and Patient connected to face mask oxygen  Post-op Assessment: Report given to RN and Post -op Vital signs reviewed and stable  Post vital signs: Reviewed and stable  Last Vitals:  Vitals:   02/19/17 1258 02/19/17 1300  BP:  (!) 161/70  Pulse: 62 69  Resp: 12 16  Temp:    SpO2: 100% 100%    Last Pain:  Vitals:   02/13/17 1044  PainSc: 4       Patients Stated Pain Goal: 2 (54/00/86 7619)  Complications: No apparent anesthesia complications

## 2017-02-19 NOTE — Anesthesia Postprocedure Evaluation (Signed)
Anesthesia Post Note  Patient: Jeanette [name]  Procedure(s) Performed: Procedure(s) (LRB): OPEN REDUCTION INTERNAL FIXATION (ORIF) LEFT DISTAL RADIAL FRACTURE (Left)     Patient location during evaluation: PACU Anesthesia Type: General Level of consciousness: awake and alert Pain management: pain level controlled Vital Signs Assessment: post-procedure vital signs reviewed and stable Respiratory status: spontaneous breathing, nonlabored ventilation and respiratory function stable Cardiovascular status: blood pressure returned to baseline and stable Postop Assessment: no signs of nausea or vomiting Anesthetic complications: no    Last Vitals:  Vitals:   02/19/17 1513 02/19/17 1523  BP:  140/63  Pulse: 63 64  Resp: 14 18  Temp:  (!) 36.4 C  SpO2: 95% 96%    Last Pain:  Vitals:   02/19/17 1523  PainSc: 0-No pain                 [name] Gravel

## 2017-02-19 NOTE — Op Note (Signed)
I assisted Surgeon(s) and Role:    * Leanora Cover, MD - Primary    Daryll Brod, MD - Assisting on the Procedure(s): OPEN REDUCTION INTERNAL FIXATION (ORIF) LEFT DISTAL RADIAL FRACTURE on 02/19/2017.  I provided assistance on this case as follows: approach, retraction, debridement, reduction, stabilization,fixation, closure and application of the dressings and splint. I was present for the entire case.  Electronically signed by: Wynonia Sours, MD Date: 02/19/2017 Time: 2:27 PM

## 2017-02-19 NOTE — Anesthesia Preprocedure Evaluation (Addendum)
Anesthesia Evaluation  Patient identified by MRN, date of birth, ID band Patient awake    Reviewed: Allergy & Precautions, NPO status , Patient's Chart, lab work & pertinent test results, reviewed documented beta blocker date and time   Airway Mallampati: II  TM Distance: >3 FB Neck ROM: Full    Dental  (+) Teeth Intact, Dental Advisory Given, Chipped   Pulmonary asthma ,    Pulmonary exam normal breath sounds clear to auscultation       Cardiovascular hypertension, Pt. on home beta blockers + Peripheral Vascular Disease  Normal cardiovascular exam+ dysrhythmias Atrial Fibrillation  Rhythm:Regular Rate:Normal     Neuro/Psych PSYCHIATRIC DISORDERS Anxiety Depression CVA, No Residual Symptoms    GI/Hepatic negative GI ROS, Neg liver ROS,   Endo/Other  negative endocrine ROS  Renal/GU Renal hypertensionRenal disease     Musculoskeletal  (+) Arthritis , Osteoarthritis,    Abdominal   Peds  Hematology  (+) Blood dyscrasia, anemia ,   Anesthesia Other Findings Day of surgery medications reviewed with the patient.  Reproductive/Obstetrics                            Anesthesia Physical Anesthesia Plan  ASA: III  Anesthesia Plan: General   Post-op Pain Management:  Regional for Post-op pain   Induction: Intravenous  PONV Risk Score and Plan: 3 and Ondansetron, Dexamethasone and Treatment may vary due to age or medical condition  Airway Management Planned: LMA  Additional Equipment:   Intra-op Plan:   Post-operative Plan: Extubation in OR  Informed Consent: I have reviewed the patients History and Physical, chart, labs and discussed the procedure including the risks, benefits and alternatives for the proposed anesthesia with the patient or authorized representative who has indicated his/her understanding and acceptance.   Dental advisory given  Plan Discussed with: CRNA  Anesthesia  Plan Comments: (Risks/benefits of general anesthesia discussed with patient including risk of damage to teeth, lips, gum, and tongue, nausea/vomiting, allergic reactions to medications, and the possibility of heart attack, stroke and death.  All patient questions answered.  Patient wishes to proceed.)        Anesthesia Quick Evaluation

## 2017-02-19 NOTE — Discharge Instructions (Addendum)
° °  ° ° ° °[facility] Center Instructions °Hand Surgery ° °Wound Care: °Keep your hand elevated above the level of your heart.  Do not allow it to dangle by your side.  Keep the dressing dry and do not remove it unless your doctor advises you to do so.  He will usually change it at the time of your post-op visit.  Moving your fingers is advised to stimulate circulation but will depend on the site of your surgery.  If you have a splint applied, your doctor will advise you regarding movement. ° °Activity: °Do not drive or operate machinery today.  Rest today and then you may return to your normal activity and work as indicated by your physician. ° °Diet:  °Drink liquids today or eat a light diet.  You may resume a regular diet tomorrow.   ° °General expectations: °Pain for two to three days. °Fingers may become slightly swollen. ° °Call your doctor if any of the following occur: °Severe pain not relieved by pain medication. °Elevated temperature. °Dressing soaked with blood. °Inability to move fingers. °White or bluish color to fingers. ° ° °Regional Anesthesia Blocks ° °1. Numbness or the inability to move the "blocked" extremity may last from 3-48 hours after placement. The length of time depends on the medication injected and your individual response to the medication. If the numbness is not going away after 48 hours, call your surgeon. ° °2. The extremity that is blocked will need to be protected until the numbness is gone and the  Strength has returned. Because you cannot feel it, you will need to take extra care to avoid injury. Because it may be weak, you may have difficulty moving it or using it. You may not know what position it is in without looking at it while the block is in effect. ° °3. For blocks in the legs and feet, returning to weight bearing and walking needs to be done carefully. You will need to wait until the numbness is entirely gone and the strength has returned. You should be able to move your leg  and foot normally before you try and bear weight or walk. You will need someone to be with you when you first try to ensure you do not fall and possibly risk injury. ° °4. Bruising and tenderness at the needle site are common side effects and will resolve in a few days. ° °5. Persistent numbness or new problems with movement should be communicated to the surgeon or the [facility] ([phone])/ [facility] ([phone]). ° ° ° °Post Anesthesia Home Care Instructions ° °Activity: °Get plenty of rest for the remainder of the day. A responsible individual must stay with you for 24 hours following the procedure.  °For the next 24 hours, DO NOT: °-Drive a car °-Operate machinery °-Drink alcoholic beverages °-Take any medication unless instructed by your physician °-Make any legal decisions or sign important papers. ° °Meals: °Start with liquid foods such as gelatin or soup. Progress to regular foods as tolerated. Avoid greasy, spicy, heavy foods. If nausea and/or vomiting occur, drink only clear liquids until the nausea and/or vomiting subsides. Call your physician if vomiting continues. ° °Special Instructions/Symptoms: °Your throat may feel dry or sore from the anesthesia or the breathing tube placed in your throat during surgery. If this causes discomfort, gargle with warm salt water. The discomfort should disappear within 24 hours. ° °If you had a scopolamine patch placed behind your ear for   the management of post- operative nausea and/or vomiting: ° °1. The medication in the patch is effective for 72 hours, after which it should be removed.  Wrap patch in a tissue and discard in the trash. Wash hands thoroughly with soap and water. °2. You may remove the patch earlier than 72 hours if you experience unpleasant side effects which may include dry mouth, dizziness or visual disturbances. °3. Avoid touching the patch. Wash your hands with soap and water after contact with the patch. °  ° ° °

## 2017-02-19 NOTE — Op Note (Signed)
02/19/2017 [name]  Operative Note  Pre Op Diagnosis: Left comminuted intraarticular distal radius fracture  Post Op Diagnosis: Left comminuted intraarticular distal radius fracture  Procedure: ORIF Left comminuted intraarticular distal radius fracture, 2 intraarticular fragments  Surgeon: Leanora Cover, MD  Assistant: Daryll Brod, MD  Anesthesia: General and Regional  Fluids: Per anesthesia flow sheet  EBL: minimal  Complications: None  Specimen: None  Tourniquet Time:  Total Tourniquet Time Documented: Upper Arm (Left) - 44 minutes Total: Upper Arm (Left) - 44 minutes   Disposition: Stable to PACU  INDICATIONS:  Jeanette [name] is a 82 y.o. female who fell approximately two weeks ago injuring her left wrist.  X rays revealed a distal radius fracture.  We discussed nonoperative and operative treatment options.  She wished to proceed with operative fixation.  Risks, benefits, and alternatives of surgery were discussed including the risk of blood loss; infection; damage to nerves, vessels, tendons, ligaments, bone; failure of surgery; need for additional surgery; complications with wound healing; continued pain; nonunion; malunion; stiffness.  We also discussed the possible need for bone graft and the benefits and risks including the possibility of disease transmission.  She voiced understanding of these risks and elected to proceed.   OPERATIVE COURSE:  After being identified preoperatively by myself, the patient and I agreed upon the procedure and site of procedure.  Surgical site was marked.  The risks, benefits and alternatives of the surgery were reviewed and she wished to proceed.  Surgical consent had been signed.  She was given IV Ancef as preoperative antibiotic prophylaxis.  She was transferred to the operating room and placed on the operating room table in supine position with the Left upper extremity on an armboard. General and Regional anesthesia was  induced by the anesthesiologist.  The Left upper extremity was prepped and draped in normal sterile orthopedic fashion.  A surgical pause was performed between the surgeons, anesthesia and operating room staff, and all were in agreement as to the patient, procedure and site of procedure.  Tourniquet at the proximal aspect of the extremity was inflated to 250 mmHg after exsanguination of the limb with an Esmarch bandage.  Standard volar Jeanette [name] approach was used.  The bipolar electrocautery was used to obtain hemostasis.  The superficial and deep portions of the FCR tendon sheath were incised, and the FCR and FPL were swept ulnarly to protect the palmar cutaneous branch of the median nerve.  The brachioradialis was released at the radial side of the radius.  The pronator quadratus was released and elevated with the periosteal elevator.  The fracture site was identified and cleared of soft tissue interposition and hematoma.  It was reduced under direct visualization.  There was intraarticular extension at the radial styloid side.   An [employer] volar distal radial locking plate was selected.  It was secured to the bone with the guidepins.  C-arm was used in AP and lateral projections to ensure appropriate reduction and position of the hardware and adjustments made as necessary.  Standard AO drilling and measuring technique was used.  A single screw was placed in the slotted hole in the shaft of the plate.  The distal holes were filled with locking pegs with the exception of the styloid holes, which were filled with locking screws.  The remaining holes in the shaft of the plate were filled with nonlocking screws.  Good purchase was obtained.  C-arm was used in AP, lateral and oblique projections to ensure appropriate  reduction and position of hardware, which was the case.  There was no intra-articular penetration of hardware.  The wound was copiously irrigated with sterile saline.  Pronator quadratus was repaired back over  top of the plate using 4-0 Vicryl suture.  Vicryl suture was placed in the subcutaneous tissues in an inverted interrupted fashion and the skin was closed with 4-0 nylon in a horizontal mattress fashion.  There was good pronation and supination of the wrist without crepitance.  The wound was then dressed with sterile Xeroform, 4x4s, and wrapped with a Kerlix bandage.  A volar splint was placed and wrapped with Kerlix and Ace bandage.  Tourniquet was deflated at 44 minutes.  Fingertips were pink with brisk capillary refill after deflation of the tourniquet.  Operative drapes were broken down.  The patient was awoken from anesthesia safely.  She was transferred back to the stretcher and taken to the PACU in stable condition.  I will see her back in the office in one week for postoperative followup.  I will give her a prescription for norco 5/325 1-2 tabs PO q6 hours prn pain, dispense #30.    Jeanette Must, MD Electronically signed, 02/19/17

## 2017-02-19 NOTE — Brief Op Note (Signed)
02/19/2017  2:21 PM  PATIENT:  SHAMAR KRACKE  82 y.o. female  PRE-OPERATIVE DIAGNOSIS:  left distal radius fracture  S52.572A  POST-OPERATIVE DIAGNOSIS:  left distal radius fracture  S52.572A  PROCEDURE:  Procedure(s): OPEN REDUCTION INTERNAL FIXATION (ORIF) LEFT DISTAL RADIAL FRACTURE (Left)  SURGEON:  Surgeon(s) and Role:    * Leanora Cover, MD - Primary    * Daryll Brod, MD - Assisting  PHYSICIAN ASSISTANT:   ASSISTANTS: Daryll Brod, MD   ANESTHESIA:   regional and general  EBL:  Total I/O In: 800 [I.V.:800] Out: 5 [Blood:5]  BLOOD ADMINISTERED:none  DRAINS: none   LOCAL MEDICATIONS USED:  NONE  SPECIMEN:  No Specimen  DISPOSITION OF SPECIMEN:  N/A  COUNTS:  YES  TOURNIQUET:   Total Tourniquet Time Documented: Upper Arm (Left) - 44 minutes Total: Upper Arm (Left) - 44 minutes   DICTATION: .Note written in EPIC  PLAN OF CARE: Discharge to home after PACU  PATIENT DISPOSITION:  PACU - hemodynamically stable.

## 2017-02-19 NOTE — Anesthesia Procedure Notes (Signed)
Procedure Name: LMA Insertion Date/Time: 02/19/2017 1:23 PM Performed by: Lyndee Leo Pre-anesthesia Checklist: Patient identified, Emergency Drugs available, Suction available and Patient being monitored Patient Re-evaluated:Patient Re-evaluated prior to induction Oxygen Delivery Method: Circle system utilized Preoxygenation: Pre-oxygenation with 100% oxygen Induction Type: IV induction Ventilation: Mask ventilation without difficulty LMA: LMA inserted LMA Size: 4.0 Number of attempts: 2 Airway Equipment and Method: Bite block Placement Confirmation: positive ETCO2 Tube secured with: Tape Dental Injury: Teeth and Oropharynx as per pre-operative assessment

## 2017-02-19 NOTE — Progress Notes (Signed)
Assisted Dr. Turk with left, ultrasound guided, supraclavicular block. Side rails up, monitors on throughout procedure. See vital signs in flow sheet. Tolerated Procedure well. 

## 2017-02-20 ENCOUNTER — Encounter (HOSPITAL_BASED_OUTPATIENT_CLINIC_OR_DEPARTMENT_OTHER): Payer: Self-pay | Admitting: Orthopedic Surgery

## 2017-03-06 ENCOUNTER — Other Ambulatory Visit: Payer: Self-pay | Admitting: Internal Medicine

## 2017-03-06 DIAGNOSIS — R410 Disorientation, unspecified: Secondary | ICD-10-CM

## 2017-03-06 DIAGNOSIS — R822 Biliuria: Secondary | ICD-10-CM

## 2017-03-06 DIAGNOSIS — R479 Unspecified speech disturbances: Secondary | ICD-10-CM

## 2017-03-07 ENCOUNTER — Ambulatory Visit
Admission: RE | Admit: 2017-03-07 | Discharge: 2017-03-07 | Disposition: A | Discharge status: Home / Self Care | Payer: Medicare Other | Source: Ambulatory Visit | Attending: Internal Medicine | Admitting: Internal Medicine

## 2017-03-07 DIAGNOSIS — R822 Biliuria: Secondary | ICD-10-CM

## 2017-03-07 DIAGNOSIS — R479 Unspecified speech disturbances: Secondary | ICD-10-CM

## 2017-03-07 DIAGNOSIS — R410 Disorientation, unspecified: Secondary | ICD-10-CM

## 2017-07-09 DIAGNOSIS — I639 Cerebral infarction, unspecified: Secondary | ICD-10-CM

## 2017-07-09 HISTORY — DX: Cerebral infarction, unspecified: I63.9

## 2017-09-25 ENCOUNTER — Telehealth: Payer: Self-pay | Admitting: Obstetrics and Gynecology

## 2017-09-25 NOTE — Telephone Encounter (Signed)
Called and left a message for patient to call back to schedule a new patient doctor referral appointment with our office for atrophic vaginitis.

## 2017-09-27 NOTE — Telephone Encounter (Signed)
Patient is returning a call to Starla. °

## 2017-09-30 NOTE — Telephone Encounter (Signed)
Patient called returning Starla's call.

## 2017-10-02 ENCOUNTER — Ambulatory Visit: Payer: Medicare Other | Admitting: Obstetrics and Gynecology

## 2017-10-02 ENCOUNTER — Encounter: Payer: Self-pay | Admitting: Obstetrics and Gynecology

## 2017-10-02 ENCOUNTER — Other Ambulatory Visit: Payer: Self-pay

## 2017-10-02 VITALS — BP 132/60 | HR 72 | Resp 16 | Ht 64.0 in | Wt 160.0 lb

## 2017-10-02 DIAGNOSIS — Z85048 Personal history of other malignant neoplasm of rectum, rectosigmoid junction, and anus: Secondary | ICD-10-CM

## 2017-10-02 DIAGNOSIS — N390 Urinary tract infection, site not specified: Secondary | ICD-10-CM

## 2017-10-02 DIAGNOSIS — Z853 Personal history of malignant neoplasm of breast: Secondary | ICD-10-CM

## 2017-10-02 DIAGNOSIS — N952 Postmenopausal atrophic vaginitis: Secondary | ICD-10-CM

## 2017-10-02 NOTE — Addendum Note (Signed)
Addended by: Susanne Greenhouse E on: 10/02/2017 03:14 PM   Modules accepted: Orders

## 2017-10-02 NOTE — Patient Instructions (Signed)
For Estrace Cream: 1 gram every night for one week, then one gram 2 x a week at bed time.

## 2017-10-02 NOTE — Progress Notes (Signed)
83 y.o. G1P1001 MarriedCaucasianF here referred by urologist for atrophic vaginitis.  She has issues with recurrent UTI. She has already had 5 UTI's this year. At times she has had intermittent vaginal irritation and pruritus, wonders about vaginitis given all of the antibiotics she has been on.  She is sexually active, she has such a small opening at this point that it is very difficult and painful. She has a h/o breast cancer 30 years ago. She had a lumpectomy, no other treatment. Unaware of hormone receptors.  She has a h/o anal cancer and had radiation treatment 31 years ago. She has intermittent diarrhea ever since the radiation. At times she needs to wear an adult diaper for the diarrhea, she is using probiotics which is helping the diarrhea some.     No LMP recorded. Patient is postmenopausal.          Sexually active: Yes.    The current method of family planning is post menopausal status.    Exercising: No.  The patient does not participate in regular exercise at present. Smoker:  no  Health Maintenance: Pap:  Unsure, thinks 3-4 years ago.  History of abnormal Pap:  Yes -years ago - cryosurgery  MMG:  10-30-16 WNL  Colonoscopy: 2014 polyps  BMD:   2016 osteopenia per patient  TDaP:  2013 Gardasil: N/A   reports that she has never smoked. She has never used smokeless tobacco. She reports that she drinks about 1.2 oz of alcohol per week. She reports that she does not use drugs. She is a retired Marine scientist, worked in Nurse, adult. She had 4 children, one biologic, other 3 step children. She had the step children since they were little. 2 of her children died of Alcohol abuse. Has 3 grandchildren, only close with one (girl).  Husband is 50, still working (in Press photographer).  Past Medical History:  Diagnosis Date  . Anal cancer ([place])   . Anxiety   . Arthritis   . Breast cancer ([place])   . Breast cancer ([place])   . Cancer of breast ([place])   . Depression   . Dysrhythmia 2010   a-fib  . Hyperlipidemia    . Hypertension   . PVD (peripheral vascular disease) ([place])   . RAS (renal artery stenosis) ([place])   . Stroke [facility])    no residual    Past Surgical History:  Procedure Laterality Date  . BREAST BIOPSY Left 01/14/2003  . BREAST LUMPECTOMY Right 1989  . CARDIOVASCULAR STRESS TEST  07/27/2003   No evidence if LV myocardial ischemia or scar. LV EF 74%.  Marland Kitchen GYNECOLOGIC CRYOSURGERY    . OPEN REDUCTION INTERNAL FIXATION (ORIF) DISTAL RADIAL FRACTURE Left 02/19/2017   Procedure: OPEN REDUCTION INTERNAL FIXATION (ORIF) LEFT DISTAL RADIAL FRACTURE;  Surgeon: Leanora Cover, MD;  Location: [place];  Service: Orthopedics;  Laterality: Left;  . OVARIAN CYST REMOVAL    . RENAL DOPPLER  09/09/2012   Right proximal renal artery: 60-99% diameter reduction. Normal patency of the left main renal artery.   . TRANSTHORACIC ECHOCARDIOGRAM  03/22/2006   EF 55-60%, mild mitral valvular regurg, pulmonary artery systolic pressure was moderately increased.  . TUBAL LIGATION      Current Outpatient Medications  Medication Sig Dispense Refill  . cholecalciferol (VITAMIN D) 1000 units tablet Take 1,000 Units by mouth daily.    . Coenzyme Q10 (COQ10 PO) Take 1 capsule by mouth daily.    Marland Kitchen escitalopram (LEXAPRO) 10 MG tablet Take 10 mg by  mouth daily.      . metoprolol (TOPROL-XL) 200 MG 24 hr tablet Take 200 mg by mouth daily.      Marland Kitchen olmesartan (BENICAR) 20 MG tablet Take 10 mg by mouth daily.     . vitamin B-12 (CYANOCOBALAMIN) 100 MCG tablet Take 50 mcg by mouth daily.      Marland Kitchen warfarin (COUMADIN) 5 MG tablet Take 2.5-5 mg by mouth daily. Take 1 tablet on Monday, Wednesday, and Friday. Take 1/2 tablet all other days     No current facility-administered medications for this visit.     History reviewed. No pertinent family history.  Review of Systems  Constitutional: Negative.   HENT: Negative.   Eyes: Negative.   Respiratory: Negative.   Cardiovascular: Negative.   Gastrointestinal: Negative.    Endocrine: Negative.   Genitourinary: Negative.   Musculoskeletal: Negative.   Skin: Negative.   Allergic/Immunologic: Negative.   Neurological: Negative.   Psychiatric/Behavioral: Negative.     Exam:   BP 132/60 (BP Location: Right Arm, Patient Position: Sitting, Cuff Size: Normal)   Pulse 72   Resp 16   Ht 5\' 4"  (1.626 m)   Wt 160 lb (72.6 kg)   BMI 27.46 kg/m   Weight change: @WEIGHTCHANGE @ Height:   Height: 5\' 4"  (162.6 cm)  Ht Readings from Last 3 Encounters:  10/02/17 5\' 4"  (1.626 m)  02/19/17 5\' 4"  (1.626 m)  08/27/16 5\' 3"  (1.6 m)    General appearance: alert, cooperative and appears stated age Abdomen: soft, non-tender; mildly distended,  no masses,  no organomegaly   Pelvic: External genitalia:  no lesions, atrophic              Urethra:  normal appearing urethra with no masses, tenderness or lesions              Bartholins and Skenes: normal                 Vagina: atrophic appearing vagina with normal color. No discharge, no lesions. The introitus is tight, even with insertion of one vaginal finger.               Cervix: no lesions               Bimanual Exam:  Uterus:  no masses or tenderness              Adnexa: no mass, fullness, tenderness               Rectovaginal: Confirms               Anus:  normal sphincter tone, no lesions  Chaperone was present for exam.  A:  Recurrent UTI  Atrophic vaginitis  Dyspareunia  Diarrhea from her prior radiation treatment    P:    Discussed vaginal estrogen. I think the risk to her of using the vaginal estrogen is low in regards to breast cancer, but she understands there could be some risk. I do think the vaginal estrogen could help prevent UTI's and help her be sexually active (if desires).   She has estrace cream, discussed dosing. I recommend one gram pv, qhs x 1 week, then 2 x a week at hs  Discussed the option of vaginal dilators if desired (once she has been using the estrogen cream  F/U in one month for  annual and pap     ~30 minutes was spent face to face, over 50% in counseling  CC: Janus Molder, FNP

## 2017-10-03 LAB — VAGINITIS/VAGINOSIS, DNA PROBE
CANDIDA SPECIES: NEGATIVE
Gardnerella vaginalis: NEGATIVE
TRICHOMONAS VAG: NEGATIVE

## 2017-11-06 ENCOUNTER — Ambulatory Visit: Payer: Medicare Other | Admitting: Obstetrics and Gynecology

## 2017-11-06 ENCOUNTER — Encounter: Payer: Self-pay | Admitting: Obstetrics and Gynecology

## 2017-11-06 ENCOUNTER — Other Ambulatory Visit (HOSPITAL_COMMUNITY)
Admission: RE | Admit: 2017-11-06 | Discharge: 2017-11-06 | Disposition: A | Discharge status: Home / Self Care | Payer: Medicare Other | Source: Ambulatory Visit | Attending: Obstetrics and Gynecology | Admitting: Obstetrics and Gynecology

## 2017-11-06 ENCOUNTER — Other Ambulatory Visit: Payer: Self-pay

## 2017-11-06 VITALS — BP 104/60 | HR 64 | Resp 14 | Ht 63.5 in | Wt 154.0 lb

## 2017-11-06 DIAGNOSIS — Z853 Personal history of malignant neoplasm of breast: Secondary | ICD-10-CM

## 2017-11-06 DIAGNOSIS — Z01419 Encounter for gynecological examination (general) (routine) without abnormal findings: Secondary | ICD-10-CM | POA: Diagnosis not present

## 2017-11-06 DIAGNOSIS — N952 Postmenopausal atrophic vaginitis: Secondary | ICD-10-CM | POA: Diagnosis not present

## 2017-11-06 DIAGNOSIS — Z124 Encounter for screening for malignant neoplasm of cervix: Secondary | ICD-10-CM | POA: Insufficient documentation

## 2017-11-06 DIAGNOSIS — Z85048 Personal history of other malignant neoplasm of rectum, rectosigmoid junction, and anus: Secondary | ICD-10-CM | POA: Diagnosis not present

## 2017-11-06 DIAGNOSIS — N941 Unspecified dyspareunia: Secondary | ICD-10-CM

## 2017-11-06 DIAGNOSIS — N390 Urinary tract infection, site not specified: Secondary | ICD-10-CM | POA: Diagnosis not present

## 2017-11-06 DIAGNOSIS — M8580 Other specified disorders of bone density and structure, unspecified site: Secondary | ICD-10-CM

## 2017-11-06 NOTE — Progress Notes (Signed)
83 y.o. G1P1001 MarriedCaucasianF here for annual exam.  The patient was seen last month with c/o recurrent UTI's dyspareunia and vaginal atrophy. After discussion about the small possible risks she started estrace cream (already had a script). No UTI's since she started it. Not sexually active since she started the estrogen.  She has a h/o breast cancer 30 years ago and a h/o anal cancer (radiation treatment) 31 years ago. Intermittently with severe diarrhea.  She fractured her wrist last year, had a mild stroke when she was off of her coumadin (h/o a.fib).  She has been more down in the last 6-12 months, daughter died, other stressors. She is on Lexapro, has good support.     No LMP recorded. Patient is postmenopausal.          Sexually active: Yes.    The current method of family planning is post menopausal status.    Exercising: No.  The patient does not participate in regular exercise at present. Smoker:  no  Health Maintenance: Pap:  Unsure  History of abnormal Pap:  Yes many years ago- cryosurgery  MMG:  10-30-16 WNL  Colonoscopy:  2014 polyps BMD:   2016 Osteopenia per patient. Her primary had prescribed medication for her, but she didn't take.  TDaP:  2013 Gardasil: N/A   reports that she has never smoked. She has never used smokeless tobacco. She reports that she drinks about 1.2 oz of alcohol per week. She reports that she does not use drugs.Retired Marine scientist, worked in Nurse, adult. She had 4 children, one biologic, other 3 step children. She had the step children since they were little. 2 of her children died of Alcohol abuse. Has 3 grandchildren, only close with one (girl).  Husband is 67, still working (in Press photographer).   Past Medical History:  Diagnosis Date  . Anal cancer ([place])   . Anxiety   . Arthritis   . Breast cancer ([place])   . Breast cancer ([place])   . Cancer of breast ([place])   . Depression   . Dysrhythmia 2010   a-fib  . Hyperlipidemia   . Hypertension   . PVD (peripheral  vascular disease) ([place])   . RAS (renal artery stenosis) ([place])   . Stroke [facility])    no residual    Past Surgical History:  Procedure Laterality Date  . BREAST BIOPSY Left 01/14/2003  . BREAST LUMPECTOMY Right 1989  . CARDIOVASCULAR STRESS TEST  07/27/2003   No evidence if LV myocardial ischemia or scar. LV EF 74%.  Marland Kitchen GYNECOLOGIC CRYOSURGERY    . OPEN REDUCTION INTERNAL FIXATION (ORIF) DISTAL RADIAL FRACTURE Left 02/19/2017   Procedure: OPEN REDUCTION INTERNAL FIXATION (ORIF) LEFT DISTAL RADIAL FRACTURE;  Surgeon: Leanora Cover, MD;  Location: [place];  Service: Orthopedics;  Laterality: Left;  . OVARIAN CYST REMOVAL    . RENAL DOPPLER  09/09/2012   Right proximal renal artery: 60-99% diameter reduction. Normal patency of the left main renal artery.   . TRANSTHORACIC ECHOCARDIOGRAM  03/22/2006   EF 55-60%, mild mitral valvular regurg, pulmonary artery systolic pressure was moderately increased.  . TUBAL LIGATION      Current Outpatient Medications  Medication Sig Dispense Refill  . cholecalciferol (VITAMIN D) 1000 units tablet Take 1,000 Units by mouth daily.    . Coenzyme Q10 (COQ10 PO) Take 1 capsule by mouth daily.    Marland Kitchen escitalopram (LEXAPRO) 10 MG tablet Take 10 mg by mouth daily.      . metoprolol (TOPROL-XL) 200  MG 24 hr tablet Take 200 mg by mouth daily.      Marland Kitchen olmesartan (BENICAR) 20 MG tablet Take 10 mg by mouth daily.     . vitamin B-12 (CYANOCOBALAMIN) 100 MCG tablet Take 50 mcg by mouth daily.      Marland Kitchen warfarin (COUMADIN) 5 MG tablet Take 2.5-5 mg by mouth daily. Take 1 tablet on Monday, Wednesday, and Friday. Take 1/2 tablet all other days     No current facility-administered medications for this visit.     History reviewed. No pertinent family history.  Review of Systems  Constitutional: Negative.   HENT: Negative.   Eyes: Negative.   Respiratory: Negative.   Cardiovascular: Negative.   Gastrointestinal: Positive for diarrhea.  Endocrine: Negative.    Genitourinary: Negative.        Left breast pain   Musculoskeletal: Negative.   Skin: Negative.   Allergic/Immunologic: Negative.   Neurological: Negative.   Psychiatric/Behavioral: Negative.     Exam:   BP 104/60 (BP Location: Right Arm, Patient Position: Sitting, Cuff Size: Normal)   Pulse 64   Resp 14   Ht 5' 3.5" (1.613 m)   Wt 154 lb (69.9 kg)   BMI 26.85 kg/m   Weight change: @WEIGHTCHANGE @ Height:   Height: 5' 3.5" (161.3 cm)  Ht Readings from Last 3 Encounters:  11/06/17 5' 3.5" (1.613 m)  10/02/17 5\' 4"  (1.626 m)  02/19/17 5\' 4"  (1.626 m)    General appearance: alert, cooperative and appears stated age Head: Normocephalic, without obvious abnormality, atraumatic Neck: no adenopathy, supple, symmetrical, trachea midline and thyroid normal to inspection and palpation Lungs: clear to auscultation bilaterally Cardiovascular: regular rate and rhythm Breasts: normal appearance, no masses or tenderness, evidence of right lumpectomy Abdomen: soft, non-tender; non distended,  no masses,  no organomegaly Extremities: extremities normal, atraumatic, no cyanosis or edema Skin: Skin color, texture, turgor normal. No rashes or lesions Lymph nodes: Cervical, supraclavicular, and axillary nodes normal. No abnormal inguinal nodes palpated Neurologic: Grossly normal   Pelvic: External genitalia:  no lesions              Urethra:  normal appearing urethra with no masses, tenderness or lesions              Bartholins and Skenes: normal                 Vagina: atrophic appearing vagina with normal color and discharge, no lesions. Only able to insert one finger vaginally (part way).              Cervix: no lesions               Bimanual Exam:  Uterus:  normal size, contour, position, consistency, mobility, non-tender              Adnexa: no mass, fullness, tenderness               Rectovaginal: Confirms               Anus:  poor sphincter tone, no lesions  Chaperone was present  for exam.  A:  Well Woman with normal exam  Recurrent UTI's on vaginal estrogen  H/O a.fib, on coumadin  H/O osteopenia and a fragility fracture  H/O breast and anal cancer  Vaginal atrophy, some improvement with estrogen (recommended she use it 2 x a week)  Tight introitus, discussed the option of vaginal dilators.    P:   Pap with reflex hpv  Continue vaginal estrogen (discussed different options, she will call if she want a different cream or tablet, including compounded)  Mammogram due  She will check on recommendation for colonoscopy  DEXA with primary, recommended she get it.  Discussed breast self exam  Discussed calcium and vit D intake

## 2017-11-06 NOTE — Patient Instructions (Signed)
Use one gram of estrace cream 2 x a week  EXERCISE AND DIET:  We recommended that you start or continue a regular exercise program for good health. Regular exercise means any activity that makes your heart beat faster and makes you sweat.  We recommend exercising at least 30 minutes per day at least 3 days a week, preferably 4 or 5.  We also recommend a diet low in fat and sugar.  Inactivity, poor dietary choices and obesity can cause diabetes, heart attack, stroke, and kidney damage, among others.    ALCOHOL AND SMOKING:  Women should limit their alcohol intake to no more than 7 drinks/beers/glasses of wine (combined, not each!) per week. Moderation of alcohol intake to this level decreases your risk of breast cancer and liver damage. And of course, no recreational drugs are part of a healthy lifestyle.  And absolutely no smoking or even second hand smoke. Most people know smoking can cause heart and lung diseases, but did you know it also contributes to weakening of your bones? Aging of your skin?  Yellowing of your teeth and nails?  CALCIUM AND VITAMIN D:  Adequate intake of calcium and Vitamin D are recommended.  The recommendations for exact amounts of these supplements seem to change often, but generally speaking 600 mg of calcium (either carbonate or citrate) and 800 units of Vitamin D per day seems prudent. Certain women may benefit from higher intake of Vitamin D.  If you are among these women, your doctor will have told you during your visit.    PAP SMEARS:  Pap smears, to check for cervical cancer or precancers,  have traditionally been done yearly, although recent scientific advances have shown that most women can have pap smears less often.  However, every woman still should have a physical exam from her gynecologist every year. It will include a breast check, inspection of the vulva and vagina to check for abnormal growths or skin changes, a visual exam of the cervix, and then an exam to  evaluate the size and shape of the uterus and ovaries.  And after 81 years of age, a rectal exam is indicated to check for rectal cancers. We will also provide age appropriate advice regarding health maintenance, like when you should have certain vaccines, screening for sexually transmitted diseases, bone density testing, colonoscopy, mammograms, etc.   MAMMOGRAMS:  All women over 81 years old should have a yearly mammogram. Many facilities now offer a "3D" mammogram, which may cost around $50 extra out of pocket. If possible,  we recommend you accept the option to have the 3D mammogram performed.  It both reduces the number of women who will be called back for extra views which then turn out to be normal, and it is better than the routine mammogram at detecting truly abnormal areas.    COLONOSCOPY:  Colonoscopy to screen for colon cancer is recommended for all women at age 81.  We know, you hate the idea of the prep.  We agree, BUT, having colon cancer and not knowing it is worse!!  Colon cancer so often starts as a polyp that can be seen and removed at colonscopy, which can quite literally save your life!  And if your first colonoscopy is normal and you have no family history of colon cancer, most women don't have to have it again for 10 years.  Once every ten years, you can do something that may end up saving your life, right?  We will be  happy to help you get it scheduled when you are ready.  Be sure to check your insurance coverage so you understand how much it will cost.  It may be covered as a preventative service at no cost, but you should check your particular policy.

## 2017-11-08 LAB — CYTOLOGY - PAP: Diagnosis: NEGATIVE

## 2018-03-15 ENCOUNTER — Inpatient Hospital Stay (HOSPITAL_COMMUNITY): Payer: Medicare Other

## 2018-03-15 ENCOUNTER — Emergency Department (HOSPITAL_COMMUNITY): Payer: Medicare Other

## 2018-03-15 ENCOUNTER — Inpatient Hospital Stay (HOSPITAL_COMMUNITY)
Admission: EM | Admit: 2018-03-15 | Discharge: 2018-03-17 | DRG: 066 | Disposition: A | Discharge status: Home / Self Care | Payer: Medicare Other | Attending: Internal Medicine | Admitting: Internal Medicine

## 2018-03-15 ENCOUNTER — Other Ambulatory Visit: Payer: Self-pay

## 2018-03-15 ENCOUNTER — Encounter (HOSPITAL_COMMUNITY): Payer: Self-pay | Admitting: Emergency Medicine

## 2018-03-15 DIAGNOSIS — I639 Cerebral infarction, unspecified: Secondary | ICD-10-CM | POA: Diagnosis present

## 2018-03-15 DIAGNOSIS — Z7901 Long term (current) use of anticoagulants: Secondary | ICD-10-CM

## 2018-03-15 DIAGNOSIS — I4891 Unspecified atrial fibrillation: Secondary | ICD-10-CM | POA: Diagnosis not present

## 2018-03-15 DIAGNOSIS — Z853 Personal history of malignant neoplasm of breast: Secondary | ICD-10-CM

## 2018-03-15 DIAGNOSIS — F329 Major depressive disorder, single episode, unspecified: Secondary | ICD-10-CM | POA: Diagnosis present

## 2018-03-15 DIAGNOSIS — Z7951 Long term (current) use of inhaled steroids: Secondary | ICD-10-CM

## 2018-03-15 DIAGNOSIS — Z8673 Personal history of transient ischemic attack (TIA), and cerebral infarction without residual deficits: Secondary | ICD-10-CM

## 2018-03-15 DIAGNOSIS — R51 Headache: Secondary | ICD-10-CM

## 2018-03-15 DIAGNOSIS — D649 Anemia, unspecified: Secondary | ICD-10-CM | POA: Diagnosis present

## 2018-03-15 DIAGNOSIS — I480 Paroxysmal atrial fibrillation: Secondary | ICD-10-CM | POA: Diagnosis present

## 2018-03-15 DIAGNOSIS — Z79899 Other long term (current) drug therapy: Secondary | ICD-10-CM

## 2018-03-15 DIAGNOSIS — I672 Cerebral atherosclerosis: Secondary | ICD-10-CM | POA: Diagnosis present

## 2018-03-15 DIAGNOSIS — R4701 Aphasia: Secondary | ICD-10-CM | POA: Diagnosis present

## 2018-03-15 DIAGNOSIS — Z888 Allergy status to other drugs, medicaments and biological substances status: Secondary | ICD-10-CM

## 2018-03-15 DIAGNOSIS — F419 Anxiety disorder, unspecified: Secondary | ICD-10-CM | POA: Diagnosis present

## 2018-03-15 DIAGNOSIS — G43909 Migraine, unspecified, not intractable, without status migrainosus: Secondary | ICD-10-CM | POA: Diagnosis present

## 2018-03-15 DIAGNOSIS — I10 Essential (primary) hypertension: Secondary | ICD-10-CM | POA: Diagnosis present

## 2018-03-15 DIAGNOSIS — I701 Atherosclerosis of renal artery: Secondary | ICD-10-CM | POA: Diagnosis present

## 2018-03-15 DIAGNOSIS — E785 Hyperlipidemia, unspecified: Secondary | ICD-10-CM | POA: Diagnosis present

## 2018-03-15 DIAGNOSIS — I5030 Unspecified diastolic (congestive) heart failure: Secondary | ICD-10-CM | POA: Diagnosis not present

## 2018-03-15 DIAGNOSIS — I739 Peripheral vascular disease, unspecified: Secondary | ICD-10-CM | POA: Diagnosis present

## 2018-03-15 DIAGNOSIS — Z85048 Personal history of other malignant neoplasm of rectum, rectosigmoid junction, and anus: Secondary | ICD-10-CM

## 2018-03-15 DIAGNOSIS — E782 Mixed hyperlipidemia: Secondary | ICD-10-CM | POA: Insufficient documentation

## 2018-03-15 DIAGNOSIS — Z885 Allergy status to narcotic agent status: Secondary | ICD-10-CM

## 2018-03-15 DIAGNOSIS — R29810 Facial weakness: Secondary | ICD-10-CM | POA: Diagnosis present

## 2018-03-15 DIAGNOSIS — R519 Headache, unspecified: Secondary | ICD-10-CM

## 2018-03-15 DIAGNOSIS — I63412 Cerebral infarction due to embolism of left middle cerebral artery: Secondary | ICD-10-CM | POA: Diagnosis not present

## 2018-03-15 HISTORY — DX: Unspecified atrial fibrillation: I48.91

## 2018-03-15 LAB — RAPID URINE DRUG SCREEN, HOSP PERFORMED
AMPHETAMINES: NOT DETECTED
BENZODIAZEPINES: NOT DETECTED
Barbiturates: NOT DETECTED
Cocaine: NOT DETECTED
OPIATES: POSITIVE — AB
Tetrahydrocannabinol: NOT DETECTED

## 2018-03-15 LAB — COMPREHENSIVE METABOLIC PANEL
ALT: 12 U/L (ref 0–44)
AST: 21 U/L (ref 15–41)
Albumin: 3.8 g/dL (ref 3.5–5.0)
Alkaline Phosphatase: 61 U/L (ref 38–126)
Anion gap: 7 (ref 5–15)
BILIRUBIN TOTAL: 0.8 mg/dL (ref 0.3–1.2)
BUN: 14 mg/dL (ref 8–23)
CO2: 28 mmol/L (ref 22–32)
Calcium: 10.2 mg/dL (ref 8.9–10.3)
Chloride: 102 mmol/L (ref 98–111)
Creatinine, Ser: 0.78 mg/dL (ref 0.44–1.00)
Glucose, Bld: 158 mg/dL — ABNORMAL HIGH (ref 70–99)
Potassium: 4 mmol/L (ref 3.5–5.1)
Sodium: 137 mmol/L (ref 135–145)
TOTAL PROTEIN: 6.9 g/dL (ref 6.5–8.1)

## 2018-03-15 LAB — CBC
HEMATOCRIT: 38.6 % (ref 36.0–46.0)
HEMOGLOBIN: 12.5 g/dL (ref 12.0–15.0)
MCH: 30.7 pg (ref 26.0–34.0)
MCHC: 32.4 g/dL (ref 30.0–36.0)
MCV: 94.8 fL (ref 78.0–100.0)
Platelets: 235 10*3/uL (ref 150–400)
RBC: 4.07 MIL/uL (ref 3.87–5.11)
RDW: 14.4 % (ref 11.5–15.5)
WBC: 5.8 10*3/uL (ref 4.0–10.5)

## 2018-03-15 LAB — DIFFERENTIAL
Basophils Absolute: 0 10*3/uL (ref 0.0–0.1)
Basophils Relative: 0 %
EOS PCT: 0 %
Eosinophils Absolute: 0 10*3/uL (ref 0.0–0.7)
LYMPHS ABS: 0.9 10*3/uL (ref 0.7–4.0)
Lymphocytes Relative: 16 %
MONO ABS: 0.7 10*3/uL (ref 0.1–1.0)
MONOS PCT: 11 %
Neutro Abs: 4.2 10*3/uL (ref 1.7–7.7)
Neutrophils Relative %: 73 %

## 2018-03-15 LAB — I-STAT CHEM 8, ED
BUN: 13 mg/dL (ref 8–23)
Calcium, Ion: 1.4 mmol/L (ref 1.15–1.40)
Chloride: 99 mmol/L (ref 98–111)
Creatinine, Ser: 0.8 mg/dL (ref 0.44–1.00)
GLUCOSE: 152 mg/dL — AB (ref 70–99)
HCT: 38 % (ref 36.0–46.0)
HEMOGLOBIN: 12.9 g/dL (ref 12.0–15.0)
Potassium: 3.9 mmol/L (ref 3.5–5.1)
Sodium: 136 mmol/L (ref 135–145)
TCO2: 26 mmol/L (ref 22–32)

## 2018-03-15 LAB — ETHANOL

## 2018-03-15 LAB — URINALYSIS, ROUTINE W REFLEX MICROSCOPIC
Bilirubin Urine: NEGATIVE
GLUCOSE, UA: NEGATIVE mg/dL
KETONES UR: NEGATIVE mg/dL
Nitrite: POSITIVE — AB
PROTEIN: NEGATIVE mg/dL
Specific Gravity, Urine: >1.046 — ABNORMAL HIGH (ref 1.005–1.030)
pH: 7 (ref 5.0–8.0)

## 2018-03-15 LAB — TROPONIN I

## 2018-03-15 LAB — PROTIME-INR
INR: 1.22
Prothrombin Time: 15.3 seconds — ABNORMAL HIGH (ref 11.4–15.2)

## 2018-03-15 LAB — APTT: aPTT: 32 seconds (ref 24–36)

## 2018-03-15 LAB — I-STAT TROPONIN, ED: TROPONIN I, POC: 0 ng/mL (ref 0.00–0.08)

## 2018-03-15 MED ORDER — ASPIRIN 325 MG PO TABS: 325.0000 mg | ORAL_TABLET | Freq: Every day | ORAL | Status: DC

## 2018-03-15 MED ORDER — IOPAMIDOL (ISOVUE-370) INJECTION 76%
INTRAVENOUS | Status: AC
  Filled 2018-03-15: qty 100

## 2018-03-15 MED ORDER — MIDAZOLAM BOLUS VIA INFUSION: 20.0000 mg | INTRAVENOUS | Status: DC

## 2018-03-15 MED ORDER — ASPIRIN 300 MG RE SUPP: 300.0000 mg | Freq: Every day | RECTAL | Status: DC

## 2018-03-15 MED ORDER — VITAMIN B-12 100 MCG PO TABS
50.0000 ug | ORAL_TABLET | Freq: Every day | ORAL | Status: DC
  Administered 2018-03-15 – 2018-03-17 (×3): 50 ug via ORAL
  Filled 2018-03-15 (×4): qty 1

## 2018-03-15 MED ORDER — ACETAMINOPHEN 160 MG/5ML PO SOLN: 650.0000 mg | ORAL | Status: DC | PRN

## 2018-03-15 MED ORDER — ONDANSETRON HCL 4 MG/2ML IJ SOLN: 4.0000 mg | Freq: Four times a day (QID) | INTRAMUSCULAR | Status: DC | PRN

## 2018-03-15 MED ORDER — STROKE: EARLY STAGES OF RECOVERY BOOK
Freq: Once | Status: AC
  Administered 2018-03-15: 20:00:00

## 2018-03-15 MED ORDER — SENNOSIDES-DOCUSATE SODIUM 8.6-50 MG PO TABS: 1.0000 | ORAL_TABLET | Freq: Every evening | ORAL | Status: DC | PRN

## 2018-03-15 MED ORDER — TRIAMCINOLONE ACETONIDE 55 MCG/ACT NA AERO
2.0000 | INHALATION_SPRAY | NASAL | Status: DC | PRN
  Filled 2018-03-15: qty 21.6

## 2018-03-15 MED ORDER — PROCHLORPERAZINE EDISYLATE 10 MG/2ML IJ SOLN: 10.0000 mg | Freq: Four times a day (QID) | INTRAMUSCULAR | Status: DC | PRN

## 2018-03-15 MED ORDER — ACETAMINOPHEN 325 MG PO TABS: 650.0000 mg | ORAL_TABLET | ORAL | Status: DC | PRN

## 2018-03-15 MED ORDER — SODIUM CHLORIDE 0.9 % IV SOLN
INTRAVENOUS | Status: DC
  Administered 2018-03-15: 20:00:00 via INTRAVENOUS

## 2018-03-15 MED ORDER — TRAMADOL HCL 50 MG PO TABS: 50.0000 mg | ORAL_TABLET | Freq: Four times a day (QID) | ORAL | Status: DC | PRN

## 2018-03-15 MED ORDER — SODIUM CHLORIDE 0.9 % IV SOLN
INTRAVENOUS | Status: DC
  Administered 2018-03-16: 13:00:00 via INTRAVENOUS

## 2018-03-15 MED ORDER — IOPAMIDOL (ISOVUE-370) INJECTION 76%
80.0000 mL | Freq: Once | INTRAVENOUS | Status: AC | PRN
  Administered 2018-03-15: 80 mL via INTRAVENOUS

## 2018-03-15 MED ORDER — ESCITALOPRAM OXALATE 10 MG PO TABS
10.0000 mg | ORAL_TABLET | Freq: Every day | ORAL | Status: DC
  Administered 2018-03-16 – 2018-03-17 (×2): 10 mg via ORAL
  Filled 2018-03-15 (×3): qty 1

## 2018-03-15 MED ORDER — ENOXAPARIN SODIUM 40 MG/0.4ML ~~LOC~~ SOLN
40.0000 mg | SUBCUTANEOUS | Status: DC
  Filled 2018-03-15: qty 0.4

## 2018-03-15 MED ORDER — LORATADINE 10 MG PO TABS: 10.0000 mg | ORAL_TABLET | Freq: Every day | ORAL | Status: DC | PRN

## 2018-03-15 MED ORDER — ASPIRIN 325 MG PO TABS
325.0000 mg | ORAL_TABLET | Freq: Every day | ORAL | Status: DC
  Administered 2018-03-15 – 2018-03-17 (×3): 325 mg via ORAL
  Filled 2018-03-15 (×4): qty 1

## 2018-03-15 MED ORDER — METOPROLOL SUCCINATE ER 100 MG PO TB24
100.0000 mg | ORAL_TABLET | Freq: Two times a day (BID) | ORAL | Status: DC
  Administered 2018-03-15 – 2018-03-17 (×4): 100 mg via ORAL
  Filled 2018-03-15 (×4): qty 1

## 2018-03-15 MED ORDER — ASPIRIN 325 MG PO TABS: 325.0000 mg | ORAL_TABLET | Freq: Once | ORAL | Status: DC

## 2018-03-15 MED ORDER — ACETAMINOPHEN 650 MG RE SUPP: 650.0000 mg | RECTAL | Status: DC | PRN

## 2018-03-15 NOTE — ED Triage Notes (Addendum)
Patient here from home with with complaints sudden onset of difficulty communicating that started last night. Not able to get words out. Reports severe headache, increased behind the eye. Taken hydrocodone with no relief. Hx of migraines, but it has been "years" since last episode.

## 2018-03-15 NOTE — ED Notes (Signed)
Pt aware urine sample needed, will call to be put on bedpan

## 2018-03-15 NOTE — ED Provider Notes (Signed)
[facility] Emergency Department Provider Note MRN:  [id number]  Arrival date & time: 03/15/18     Chief Complaint   Headache and Aphasia   History of Present Illness   Jeanette [name] is a 83 y.o. year-old female with a history of stroke presenting to the ED with chief complaint of headache and aphasia.  3 days of persistent frontal headache, radiating behind the eyes.  Describes severe, dull.  Less active, low energy for the past 2 days per husband.  More recent word finding difficulties, slow speech, coming up with the wrong words.  The symptoms more recently, estimated to be at midnight last night.  More noticeable this morning, noticed by husband, who brought her here for evaluation.  No recent fevers, no chest pain or shortness of breath, no abdominal pain, no numbness or weakness in the arms or legs.  Review of Systems  A complete 10 system review of systems was obtained and all systems are negative except as noted in the HPI and PMH.   Patient's Health History    Past Medical History:  Diagnosis Date  . Anal cancer ([place])   . Anxiety   . Arthritis   . Breast cancer ([place])   . Breast cancer ([place])   . Cancer of breast ([place])   . Depression   . Dysrhythmia 2010   a-fib  . Hyperlipidemia   . Hypertension   . PVD (peripheral vascular disease) ([place])   . RAS (renal artery stenosis) ([place])   . Stroke [facility])    no residual    Past Surgical History:  Procedure Laterality Date  . BREAST BIOPSY Left 01/14/2003  . BREAST LUMPECTOMY Right 1989  . CARDIOVASCULAR STRESS TEST  07/27/2003   No evidence if LV myocardial ischemia or scar. LV EF 74%.  Marland Kitchen GYNECOLOGIC CRYOSURGERY    . OPEN REDUCTION INTERNAL FIXATION (ORIF) DISTAL RADIAL FRACTURE Left 02/19/2017   Procedure: OPEN REDUCTION INTERNAL FIXATION (ORIF) LEFT DISTAL RADIAL FRACTURE;  Surgeon: Leanora Cover, MD;  Location: [place];  Service: Orthopedics;  Laterality: Left;  . OVARIAN CYST REMOVAL     . RENAL DOPPLER  09/09/2012   Right proximal renal artery: 60-99% diameter reduction. Normal patency of the left main renal artery.   . TRANSTHORACIC ECHOCARDIOGRAM  03/22/2006   EF 55-60%, mild mitral valvular regurg, pulmonary artery systolic pressure was moderately increased.  . TUBAL LIGATION      No family history on file.  Social History   Socioeconomic History  . Marital status: Married    Spouse name: Not on file  . Number of children: Not on file  . Years of education: Not on file  . Highest education level: Not on file  Occupational History  . Not on file  Social Needs  . Financial resource strain: Not on file  . Food insecurity:    Worry: Not on file    Inability: Not on file  . Transportation needs:    Medical: Not on file    Non-medical: Not on file  Tobacco Use  . Smoking status: Never Smoker  . Smokeless tobacco: Never Used  Substance and Sexual Activity  . Alcohol use: Yes    Alcohol/week: 2.0 standard drinks    Types: 2 Glasses of wine per week  . Drug use: No  . Sexual activity: Yes    Partners: Male    Birth control/protection: Post-menopausal  Lifestyle  . Physical activity:    Days per week: Not  on file    Minutes per session: Not on file  . Stress: Not on file  Relationships  . Social connections:    Talks on phone: Not on file    Gets together: Not on file    Attends religious service: Not on file    Active member of club or organization: Not on file    Attends meetings of clubs or organizations: Not on file    Relationship status: Not on file  . Intimate partner violence:    Fear of current or ex partner: Not on file    Emotionally abused: Not on file    Physically abused: Not on file    Forced sexual activity: Not on file  Other Topics Concern  . Not on file  Social History Narrative  . Not on file     Physical Exam  Vital Signs and Nursing Notes reviewed Vitals:   03/15/18 1506 03/15/18 1554  BP: (!) 146/62 (!) 120/55  Pulse:  66 65  Resp: 16 10  Temp:    SpO2: 94% 97%    CONSTITUTIONAL: Well-appearing, NAD NEURO:  Alert and oriented x 3, subtle left pronator drift, mixed aphasia EYES:  eyes equal and reactive ENT/NECK:  no LAD, no JVD CARDIO: Regular rate, well-perfused, normal S1 and S2 PULM:  CTAB no wheezing or rhonchi GI/GU:  normal bowel sounds, non-distended, non-tender MSK/SPINE:  No gross deformities, no edema SKIN:  no rash, atraumatic PSYCH:  Appropriate speech and behavior  Diagnostic and Interventional Summary    EKG Interpretation  Date/Time:  Saturday March 15 2018 15:05:57 EDT Ventricular Rate:  65 PR Interval:    QRS Duration: 81 QT Interval:  433 QTC Calculation: 451 R Axis:   68 Text Interpretation:  Sinus rhythm Borderline T abnormalities, anterior leads Confirmed by Gerlene Fee [phone]) on 03/15/2018 4:19:10 PM      Labs Reviewed  PROTIME-INR - Abnormal; Notable for the following components:      Result Value   Prothrombin Time 15.3 (*)    All other components within normal limits  COMPREHENSIVE METABOLIC PANEL - Abnormal; Notable for the following components:   Glucose, Bld 158 (*)    All other components within normal limits  I-STAT CHEM 8, ED - Abnormal; Notable for the following components:   Glucose, Bld 152 (*)    All other components within normal limits  URINE CULTURE  ETHANOL  APTT  CBC  DIFFERENTIAL  RAPID URINE DRUG SCREEN, HOSP PERFORMED  URINALYSIS, ROUTINE W REFLEX MICROSCOPIC  I-STAT TROPONIN, ED    CT ANGIO HEAD W OR WO CONTRAST  Final Result    CT ANGIO NECK W OR WO CONTRAST  Final Result    CT HEAD CODE STROKE WO CONTRAST  Final Result      Medications  iopamidol (ISOVUE-370) 76 % injection (has no administration in time range)  iopamidol (ISOVUE-370) 76 % injection 80 mL (80 mLs Intravenous Contrast Given 03/15/18 1448)     Procedures Critical Care Critical Care Documentation Critical care time provided by me (excluding  procedures): 40 minutes  Condition necessitating critical care: Acute stroke, initiation of code stroke protocol  Components of critical care management: reviewing of prior records, laboratory and imaging interpretation, frequent re-examination and reassessment of vital signs, discussion with consulting services.    ED Course and Medical Decision Making  I have reviewed the triage vital signs and the nursing notes.  Pertinent labs & imaging results that were available during my care of the  patient were reviewed by me and considered in my medical decision making (see below for details).    Concern for acute stroke in this 83 year old female, history of stroke here with persistent headache, mixed aphasia.  Code stroke initiated, favoring hemorrhagic process given Coumadin use and general lack of lateralizing symptoms.  CT head, CTA head and neck without evidence of acute stroke.  Consulted with neurology, who still favoring acute stroke.  Recommending admission to hospital service at [facility].  Awaiting transport.  Barth Kirks. Sedonia Small, [place] [email or web address]  Final Clinical Impressions(s) / ED Diagnoses     ICD-10-CM   1. Aphasia R47.01   2. Bad headache R51     ED Discharge Orders    None         Maudie Flakes, MD 03/15/18 1626

## 2018-03-15 NOTE — Consult Note (Signed)
Neurology Consultation Reason for Consult: Stroke Referring Physician: Sedonia Small ,M   CC: Aphasia  History is obtained from: Patient  HPI: TIA Jeanette [name] is a 83 y.o. female with a history of atrial fibrillation on Coumadin who presents with aphasia and right hemianopia that is been present since awakening Friday morning.  She presented to the emergency department today for evaluation of this.  Initially, there was thought that she may be within 24 hours of onset and therefore code stroke was activated.  She was taken for a stat head CT which is negative and a CTA which demonstrates multifocal stenosis, but no large vessel occlusion.   LKW: 9/5 prior to bed tpa given?: no, out of window    ROS: A 14 point ROS was performed and is negative except as noted in the HPI.   Past Medical History:  Diagnosis Date  . A-fib ([place]) 03/15/2018  . Anal cancer ([place])   . Anxiety   . Arthritis   . Breast cancer ([place])   . Breast cancer ([place])   . Cancer of breast ([place])   . Depression   . Dysrhythmia 2010   a-fib  . Hyperlipidemia   . Hypertension   . PVD (peripheral vascular disease) ([place])   . RAS (renal artery stenosis) ([place])   . Stroke [facility])    no residual     Family History  Problem Relation Age of Onset  . Alzheimer's disease Mother   . Pneumonia Mother   . Heart failure Father      Social History:  reports that she has never smoked. She has never used smokeless tobacco. She reports that she drinks about 2.0 standard drinks of alcohol per week. She reports that she does not use drugs.   Exam: Current vital signs: BP (!) 143/61 (BP Location: Left Arm)   Pulse 64   Temp 98.7 F (37.1 C) (Oral)   Resp 20   Ht 5\' 4"  (1.626 m)   Wt 69.4 kg   SpO2 99%   BMI 26.26 kg/m  Vital signs in last 24 hours: Temp:  [98.6 F (37 C)-98.7 F (37.1 C)] 98.7 F (37.1 C) (09/07 1923) Pulse Rate:  [61-87] 64 (09/07 1923) Resp:  [10-20] 20 (09/07 1923) BP: (120-146)/(51-63) 143/61 (09/07  1923) SpO2:  [90 %-100 %] 99 % (09/07 1923) Weight:  [69.4 kg] 69.4 kg (09/07 1923)   Physical Exam  Constitutional: Appears well-developed and well-nourished.  Psych: Affect appropriate to situation Eyes: No scleral injection HENT: No OP obstrucion Head: Normocephalic.  Cardiovascular: Normal rate and regular rhythm.  Respiratory: Effort normal, non-labored breathing GI: Soft.  No distension. There is no tenderness.  Skin: WDI  Neuro: Mental Status: Patient is awake, alert, oriented to person, gives age as 89, gives month as November. cranial Nerves: II: She has a right hemianopia pupils are equal, round, and reactive to light.   III,IV, VI: EOMI without ptosis or diploplia.  V: Facial sensation is symmetric to temperature VII: Facial movement with mild right facial weakness.  VIII: hearing is intact to voice X: Uvula elevates symmetrically XI: Shoulder shrug is symmetric. XII: tongue is midline without atrophy or fasciculations.  Motor: Tone is normal. Bulk is normal. 5/5 strength was present in all four extremities.  Sensory: Sensation is symmetric to light touch and temperature in the arms and legs. Cerebellar: FNF intact bilaterally  I have reviewed labs in epic and the results pertinent to this consultation are: cmp - unremarkable  I have reviewed  the images obtained:CTA - multifocal vessel disease without LVO  Impression: 83 year old female with what I suspect is a posterior MCA territory infarct.  Though there is no clear LVO, I would favor assessing the size of the infarct prior to restarting anticoagulation.  Recommendations: - HgbA1c, fasting lipid panel - MRI of the brain without contrast - Frequent neuro checks - Echocardiogram - Prophylactic therapy-Antiplatelet med: Aspirin - dose 325mg  PO or 300mg  PR - Risk factor modification - Telemetry monitoring - PT consult, OT consult, Speech consult -I would hold Coumadin until we know the size of the  infarct - Stroke team to follow    Roland Rack, MD [facility] [phone]  If 7pm- 7am, please page neurology on call as listed in [place].

## 2018-03-15 NOTE — ED Notes (Signed)
ED TO INPATIENT HANDOFF REPORT  Name/Age/Gender Jeanette [name] 83 y.o. female  Code Status   Home/SNF/Other Home  Chief Complaint stroke?  Level of Care/Admitting Diagnosis ED Disposition    ED Disposition Condition Comment   Admit  Hospital Area: [facility] [[id number]]  Level of Care: Telemetry [5]  Diagnosis: CVA (cerebral vascular accident) [facility]) [[id number]]  Admitting Physician: Eugenie Filler [[id number]]  Attending Physician: Eugenie Filler [[id number]]  Estimated length of stay: past midnight tomorrow  Certification:: I certify this patient will need inpatient services for at least 2 midnights  Bed request comments: Neuro telemetry  PT Class (Do Not Modify): Inpatient [101]  PT Acc Code (Do Not Modify): Private [1]       Medical History Past Medical History:  Diagnosis Date  . A-fib ([place]) 03/15/2018  . Anal cancer ([place])   . Anxiety   . Arthritis   . Breast cancer ([place])   . Breast cancer ([place])   . Cancer of breast ([place])   . Depression   . Dysrhythmia 2010   a-fib  . Hyperlipidemia   . Hypertension   . PVD (peripheral vascular disease) ([place])   . RAS (renal artery stenosis) ([place])   . Stroke [facility])    no residual    Allergies Allergies  Allergen Reactions  . Benazepril Hcl     cough  . Epinephrine     Shortness of breath and increased heart rate  . Lisinopril     Cough  . Oxycodone Nausea And Vomiting    Slurred speech, weakness, nausea  . Vytorin [Ezetimibe-Simvastatin]     IV Location/Drains/Wounds Patient Lines/Drains/Airways Status   Active Line/Drains/Airways    Name:   Placement date:   Placement time:   Site:   Days:   Peripheral IV 03/15/18 Right Antecubital   03/15/18    1430    Antecubital   less than 1   Incision (Closed) 02/19/17 Hand Left   02/19/17    1345     389          Labs/Imaging Results for orders placed or performed during the hospital encounter of 03/15/18 (from the past 48 hour(s))  Ethanol     Status:  None   Collection Time: 03/15/18  2:36 PM  Result Value Ref Range   Alcohol, Ethyl (B) <10 <10 mg/dL    Comment: (NOTE) Lowest detectable limit for serum alcohol is 10 mg/dL. For medical purposes only. Performed at [facility], [place] [address], [place], [place] [address]   Protime-INR     Status: Abnormal   Collection Time: 03/15/18  2:36 PM  Result Value Ref Range   Prothrombin Time 15.3 (H) 11.4 - 15.2 seconds   INR 1.22     Comment: Performed at [facility], [place] [address] [place], [place] [address]  APTT     Status: None   Collection Time: 03/15/18  2:36 PM  Result Value Ref Range   aPTT 32 24 - 36 seconds    Comment: Performed at [facility], [place] [address], [place], [place] [address]  CBC     Status: None   Collection Time: 03/15/18  2:36 PM  Result Value Ref Range   WBC 5.8 4.0 - 10.5 K/uL   RBC 4.07 3.87 - 5.11 MIL/uL   Hemoglobin 12.5 12.0 - 15.0 g/dL   HCT 38.6 36.0 - 46.0 %   MCV 94.8 78.0 - 100.0 fL   MCH  30.7 26.0 - 34.0 pg   MCHC 32.4 30.0 - 36.0 g/dL   RDW 14.4 11.5 - 15.5 %   Platelets 235 150 - 400 K/uL    Comment: Performed at [facility], [place] [address], [place], [place] [address]  Differential     Status: None   Collection Time: 03/15/18  2:36 PM  Result Value Ref Range   Neutrophils Relative % 73 %   Neutro Abs 4.2 1.7 - 7.7 K/uL   Lymphocytes Relative 16 %   Lymphs Abs 0.9 0.7 - 4.0 K/uL   Monocytes Relative 11 %   Monocytes Absolute 0.7 0.1 - 1.0 K/uL   Eosinophils Relative 0 %   Eosinophils Absolute 0.0 0.0 - 0.7 K/uL   Basophils Relative 0 %   Basophils Absolute 0.0 0.0 - 0.1 K/uL    Comment: Performed at [facility], [place] [address] [place], [place] [address]  Comprehensive metabolic panel     Status: Abnormal   Collection Time: 03/15/18  2:36 PM  Result Value Ref Range   Sodium 137 135 - 145 mmol/L   Potassium 4.0 3.5 - 5.1 mmol/L    Chloride 102 98 - 111 mmol/L   CO2 28 22 - 32 mmol/L   Glucose, Bld 158 (H) 70 - 99 mg/dL   BUN 14 8 - 23 mg/dL   Creatinine, Ser 0.78 0.44 - 1.00 mg/dL   Calcium 10.2 8.9 - 10.3 mg/dL   Total Protein 6.9 6.5 - 8.1 g/dL   Albumin 3.8 3.5 - 5.0 g/dL   AST 21 15 - 41 U/L   ALT 12 0 - 44 U/L   Alkaline Phosphatase 61 38 - 126 U/L   Total Bilirubin 0.8 0.3 - 1.2 mg/dL   GFR calc non Af Amer >60 >60 mL/min   GFR calc Af Amer >60 >60 mL/min    Comment: (NOTE) The eGFR has been calculated using the CKD EPI equation. This calculation has not been validated in all clinical situations. eGFR's persistently <60 mL/min signify possible Chronic Kidney Disease.    Anion gap 7 5 - 15    Comment: Performed at [facility], [place] [address] [place], [place] [address]  I-stat troponin, ED     Status: None   Collection Time: 03/15/18  2:40 PM  Result Value Ref Range   Troponin i, poc 0.00 0.00 - 0.08 ng/mL   Comment 3            Comment: Due to the release kinetics of cTnI, a negative result within the first hours of the onset of symptoms does not rule out myocardial infarction with certainty. If myocardial infarction is still suspected, repeat the test at appropriate intervals.   I-Stat Chem 8, ED     Status: Abnormal   Collection Time: 03/15/18  2:42 PM  Result Value Ref Range   Sodium 136 135 - 145 mmol/L   Potassium 3.9 3.5 - 5.1 mmol/L   Chloride 99 98 - 111 mmol/L   BUN 13 8 - 23 mg/dL   Creatinine, Ser 0.80 0.44 - 1.00 mg/dL   Glucose, Bld 152 (H) 70 - 99 mg/dL   Calcium, Ion 1.40 1.15 - 1.40 mmol/L   TCO2 26 22 - 32 mmol/L   Hemoglobin 12.9 12.0 - 15.0 g/dL   HCT 38.0 36.0 - 46.0 %   Ct Angio Head W Or Wo Contrast  Result Date: 03/15/2018 CLINICAL DATA:  Speech difficulty beginning 2 days ago.  EXAM: CT ANGIOGRAPHY HEAD AND NECK TECHNIQUE: Multidetector CT imaging of the head and neck was performed using the standard protocol during bolus administration of  intravenous contrast. Multiplanar CT image reconstructions and MIPs were obtained to evaluate the vascular anatomy. Carotid stenosis measurements (when applicable) are obtained utilizing NASCET criteria, using the distal internal carotid diameter as the denominator. CONTRAST:  26m ISOVUE-370 IOPAMIDOL (ISOVUE-370) INJECTION 76% COMPARISON:  Head and neck MRA 11/21/2005 FINDINGS: CTA NECK FINDINGS Aortic arch: Incomplete imaging of the aortic arch including of the brachiocephalic artery origin. Widely patent left common carotid and left subclavian artery origins. Right carotid system: Patent with mild calcified and soft plaque in the carotid bulb. No evidence of significant stenosis or dissection. Retropharyngeal course of the proximal ICA. Left carotid system: Patent with mild calcified plaque at the carotid bifurcation. No evidence of stenosis or dissection. Vertebral arteries: Patent with the left vertebral artery being mildly dominant. New moderate right vertebral artery origin stenosis. No left vertebral artery stenosis. Skeleton: Moderately advanced disc degeneration at C5-6 and C6-7 and advanced left facet arthrosis at C4-5 with grade 1 anterolisthesis. Other neck: Subcentimeter calcified right thyroid nodule. Upper chest: Clear lung apices. Review of the MIP images confirms the above findings CTA HEAD FINDINGS Anterior circulation: The internal carotid arteries are patent from skull base to carotid termini with mild atherosclerosis and no evidence of significant stenosis. ACAs and MCAs are patent without evidence of proximal branch occlusion. There are severe proximal left M1 and moderate distal right M1 stenoses which are new. A severe left M2 superior division origin stenosis may have progressed. There is also a moderate mid left M2 branch vessel stenosis. No aneurysm is identified. Posterior circulation: The intracranial vertebral arteries are patent to the basilar with mild irregularity but no  significant stenosis. Patent PICA and SCA origins are identified bilaterally. The left AICA is patent while the right is small and not well evaluated. The basilar artery is widely patent. There is a small left posterior communicating artery which is likely heavily diseased. The PCAs are patent without evidence of flow limiting proximal stenosis. There is a mild proximal P2 stenosis on the left. No aneurysm is identified. Venous sinuses: Patent. Anatomic variants: None. Delayed phase: No abnormal enhancement. Review of the MIP images confirms the above findings IMPRESSION: 1. No emergent large vessel occlusion. 2. Progressive intracranial atherosclerosis with severe left M1 and M2 stenoses, moderate right M1 stenosis, and mild left P2 stenosis. 3. Widely patent cervical carotid arteries. 4. New moderate right vertebral artery origin stenosis. 5.  Aortic Atherosclerosis (ICD10-I70.0). These results were communicated to Dr. KLeonel Ramsayat 3:05 pm on 03/15/2018 by text page via the [facility]aging system. Electronically Signed   By: ALogan BoresM.D.   On: 03/15/2018 15:28   Ct Angio Neck W Or Wo Contrast  Result Date: 03/15/2018 CLINICAL DATA:  Speech difficulty beginning 2 days ago. EXAM: CT ANGIOGRAPHY HEAD AND NECK TECHNIQUE: Multidetector CT imaging of the head and neck was performed using the standard protocol during bolus administration of intravenous contrast. Multiplanar CT image reconstructions and MIPs were obtained to evaluate the vascular anatomy. Carotid stenosis measurements (when applicable) are obtained utilizing NASCET criteria, using the distal internal carotid diameter as the denominator. CONTRAST:  85mISOVUE-370 IOPAMIDOL (ISOVUE-370) INJECTION 76% COMPARISON:  Head and neck MRA 11/21/2005 FINDINGS: CTA NECK FINDINGS Aortic arch: Incomplete imaging of the aortic arch including of the brachiocephalic artery origin. Widely patent left common carotid and left subclavian artery origins. Right  carotid  system: Patent with mild calcified and soft plaque in the carotid bulb. No evidence of significant stenosis or dissection. Retropharyngeal course of the proximal ICA. Left carotid system: Patent with mild calcified plaque at the carotid bifurcation. No evidence of stenosis or dissection. Vertebral arteries: Patent with the left vertebral artery being mildly dominant. New moderate right vertebral artery origin stenosis. No left vertebral artery stenosis. Skeleton: Moderately advanced disc degeneration at C5-6 and C6-7 and advanced left facet arthrosis at C4-5 with grade 1 anterolisthesis. Other neck: Subcentimeter calcified right thyroid nodule. Upper chest: Clear lung apices. Review of the MIP images confirms the above findings CTA HEAD FINDINGS Anterior circulation: The internal carotid arteries are patent from skull base to carotid termini with mild atherosclerosis and no evidence of significant stenosis. ACAs and MCAs are patent without evidence of proximal branch occlusion. There are severe proximal left M1 and moderate distal right M1 stenoses which are new. A severe left M2 superior division origin stenosis may have progressed. There is also a moderate mid left M2 branch vessel stenosis. No aneurysm is identified. Posterior circulation: The intracranial vertebral arteries are patent to the basilar with mild irregularity but no significant stenosis. Patent PICA and SCA origins are identified bilaterally. The left AICA is patent while the right is small and not well evaluated. The basilar artery is widely patent. There is a small left posterior communicating artery which is likely heavily diseased. The PCAs are patent without evidence of flow limiting proximal stenosis. There is a mild proximal P2 stenosis on the left. No aneurysm is identified. Venous sinuses: Patent. Anatomic variants: None. Delayed phase: No abnormal enhancement. Review of the MIP images confirms the above findings IMPRESSION: 1. No emergent  large vessel occlusion. 2. Progressive intracranial atherosclerosis with severe left M1 and M2 stenoses, moderate right M1 stenosis, and mild left P2 stenosis. 3. Widely patent cervical carotid arteries. 4. New moderate right vertebral artery origin stenosis. 5.  Aortic Atherosclerosis (ICD10-I70.0). These results were communicated to Dr. Leonel Ramsay at 3:05 pm on 03/15/2018 by text page via the [facility] messaging system. Electronically Signed   By: Logan Bores M.D.   On: 03/15/2018 15:28   Ct Head Code Stroke Wo Contrast  Result Date: 03/15/2018 CLINICAL DATA:  Code stroke. Speech difficulty beginning 2 days ago. EXAM: CT HEAD WITHOUT CONTRAST TECHNIQUE: Contiguous axial images were obtained from the base of the skull through the vertex without intravenous contrast. COMPARISON:  CT head without contrast 03/07/2017 FINDINGS: Brain: No acute infarct, hemorrhage, or mass lesion is present. Mild atrophy and white matter changes are present bilaterally. Remote left parietal lobe infarct is stable. No acute cortical abnormality is present. The basal ganglia are intact. Insular ribbon is normal. Insular ribbon is normal bilaterally. Cavum septum pellucidum is again noted. The ventricles are otherwise of normal size. The brainstem and cerebellum are normal. Vascular: Atherosclerotic calcifications are present in the cavernous internal carotid arteries. There is no hyperdense vessel. Skull: The skull base is normal. No focal lytic or blastic lesions are present. No significant extracranial soft tissue lesions are present. Sinuses/Orbits: The paranasal sinuses and mastoid air cells are clear. Bilateral lens replacements are present. Globes and orbits are within normal limits. ASPECTS [facility] Stroke Program Early CT Score) - Ganglionic level infarction (caudate, lentiform nuclei, internal capsule, insula, M1-M3 cortex): 7/7 - Supraganglionic infarction (M4-M6 cortex): 3/3 Total score (0-10 with 10 being normal): 10/10  IMPRESSION: 1. No acute intracranial abnormality or significant interval change. 2. Mild atrophy and  white matter disease is stable. 3. Remote left parietal lobe infarct is stable. 4. ASPECTS is 10/10 These results were called by telephone at the time of interpretation on 03/15/2018 at 2:53 pm to Dr. Gerlene Fee , who verbally acknowledged these results. Electronically Signed   By: [place] M.D.   On: 03/15/2018 14:54    Pending Labs Unresulted Labs (From admission, onward)    Start     Ordered   03/15/18 1625  Culture, Urine  Once,   R     03/15/18 1624   03/15/18 1427  Urine rapid drug screen (hosp performed)  STAT,   STAT     03/15/18 1427   03/15/18 1427  Urinalysis, Routine w reflex microscopic  STAT,   STAT     03/15/18 1427   Signed and Held  Hemoglobin A1c  Tomorrow morning,   R     Signed and Held   Signed and Held  Lipid panel  Tomorrow morning,   R    Comments:  Fasting    Signed and Held   Signed and Held  Protime-INR  Tomorrow morning,   R     Signed and Held   Signed and Held  Troponin I  Once,   R     Signed and Held          Vitals/Pain Today's Vitals   03/15/18 1630 03/15/18 1635 03/15/18 1700 03/15/18 1730  BP: 125/63 125/63 (!) 141/58 (!) 123/51  Pulse: 66 67 62 65  Resp: '15 13 15 14  ' Temp:      SpO2: 90% 100% 94% 92%  PainSc:        Isolation Precautions No active isolations  Medications Medications  iopamidol (ISOVUE-370) 76 % injection (has no administration in time range)  aspirin tablet 325 mg (325 mg Oral Given 03/15/18 1749)  0.9 %  sodium chloride infusion (has no administration in time range)  iopamidol (ISOVUE-370) 76 % injection 80 mL (80 mLs Intravenous Contrast Given 03/15/18 1448)    Mobility walks

## 2018-03-15 NOTE — H&P (Signed)
History and Physical    Jeanette [name] TGG:[id number] DOB: 10-28-1934 DOA: 03/15/2018  PCP: Crist Infante, MD  Patient coming from: Home  I have personally briefly reviewed patient's old medical records in [place]  Chief Complaint: Headache/difficulty with speech/aphasia  HPI: Jeanette [name] is a 83 y.o. female with medical history significant of prior CVA per patient when her Coumadin was held for wrist surgery, history of atrial fibrillation on chronic anticoagulation, hypertension, hyperlipidemia, prior history of breast cancer and anal cancer, peripheral vascular disease, history of renal artery stenosis presenting to the ED with a 3-day history of headache and pain behind both eyes with associated photophobia, phonophobia.  Patient states thought it was her sinuses took some Claritin and Nasacort however with no significant improvement.  Patient also noted to have both a receptive and expressive aphasia ongoing for the past 48 hours prior to admission.  Patient states she had difficulty with word finding and processing information.  Patient denies any fever, no chills, no nausea, no vomiting, no chest pain, no asymmetric weakness or numbness, no facial asymmetry, no visual field changes, no syncope, no constipation, no change in chronic diarrhea from radiation treatments, no melena, no hematemesis, no hematochezia.  Patient denies any dysuria.  Patient denies any abdominal pain.  Patient does endorse some wheezing.  Patient denies any chest pain.  Patient states some improvement with his speech since presentation to the ED.  Patient however noted to have both some receptive and expressive aphasia during my interview.  Patient does endorse compliance with her anticoagulation.  ED Course: Patient seen in the ED initially thought of to be a code stroke on presentation concerning for hemorrhagic process due to ongoing Coumadin use code stroke was initiated.  CT head done showed no acute  intracranial abnormality or significant interval change.  Mild atrophy and white matter disease is stable.  Remote left parietal lobe infarct stable.  Compressive metabolic profile obtained had a glucose of 158 otherwise was unremarkable.  Point-of-care troponin was negative.  CBC done was unremarkable.  INR was 1.22.  Chest x-ray not done.  Urinalysis pending.  CT angiogram head and neck which was done showed no emergent large vessel occlusion.  Progressive intracranial atherosclerosis with severe left M1 and M2 stenosis, moderate right M1 stenosis, mild left P2 stenosis.  Widely patent cervical carotid arteries.  New moderate right vertebral artery origin stenosis.  Patient seen in consultation by neurology recommendations were made to admit patient to [facility] for stroke work-up.  Review of Systems: As per HPI otherwise 10 point review of systems negative.   Past Medical History:  Diagnosis Date  . A-fib ([place]) 03/15/2018  . Anal cancer ([place])   . Anxiety   . Arthritis   . Breast cancer ([place])   . Breast cancer ([place])   . Cancer of breast ([place])   . Depression   . Dysrhythmia 2010   a-fib  . Hyperlipidemia   . Hypertension   . PVD (peripheral vascular disease) ([place])   . RAS (renal artery stenosis) ([place])   . Stroke [facility])    no residual    Past Surgical History:  Procedure Laterality Date  . BREAST BIOPSY Left 01/14/2003  . BREAST LUMPECTOMY Right 1989  . CARDIOVASCULAR STRESS TEST  07/27/2003   No evidence if LV myocardial ischemia or scar. LV EF 74%.  [facility] GYNECOLOGIC CRYOSURGERY    . OPEN REDUCTION INTERNAL FIXATION (ORIF) DISTAL RADIAL FRACTURE Left 02/19/2017   Procedure:  OPEN REDUCTION INTERNAL FIXATION (ORIF) LEFT DISTAL RADIAL FRACTURE;  Surgeon: Leanora Cover, MD;  Location: [place];  Service: Orthopedics;  Laterality: Left;  . OVARIAN CYST REMOVAL    . RENAL DOPPLER  09/09/2012   Right proximal renal artery: 60-99% diameter reduction. Normal patency of the  left main renal artery.   . TRANSTHORACIC ECHOCARDIOGRAM  03/22/2006   EF 55-60%, mild mitral valvular regurg, pulmonary artery systolic pressure was moderately increased.  . TUBAL LIGATION       reports that she has never smoked. She has never used smokeless tobacco. She reports that she drinks about 2.0 standard drinks of alcohol per week. She reports that she does not use drugs.  Allergies  Allergen Reactions  . Benazepril Hcl     cough  . Epinephrine     Shortness of breath and increased heart rate  . Lisinopril     Cough  . Oxycodone Nausea And Vomiting    Slurred speech, weakness, nausea  . Vytorin [Ezetimibe-Simvastatin]     Family History  Problem Relation Age of Onset  . Alzheimer's disease Mother   . Pneumonia Mother   . Heart failure Father    Mother deceased age 60 with a history of Alzheimer's dementia and from pneumonia.  Father deceased age 83/83 from CHF per patient.  Prior to Admission medications   Medication Sig Start Date End Date Taking? Authorizing Provider  cholecalciferol (VITAMIN D) 1000 units tablet Take 1,000 Units by mouth daily.   Yes [provider]  Coenzyme Q10 (COQ10 PO) Take 1 capsule by mouth daily.   Yes [provider]  escitalopram (LEXAPRO) 10 MG tablet Take 10 mg by mouth daily.     Yes [provider]  HYDROcodone-acetaminophen (NORCO/VICODIN) 5-325 MG tablet Take 1 tablet by mouth every 6 (six) hours as needed for moderate pain.   Yes [provider]  loratadine (CLARITIN) 10 MG tablet Take 10 mg by mouth daily as needed for allergies.   Yes [provider]  metoprolol (TOPROL-XL) 200 MG 24 hr tablet Take 100 mg by mouth 2 (two) times daily.    Yes [provider]  olmesartan (BENICAR) 20 MG tablet Take 10 mg by mouth daily.    Yes [provider]  triamcinolone (NASACORT ALLERGY 24HR) 55 MCG/ACT AERO nasal inhaler Place 2 sprays into the nose as needed (sinus pressure).   Yes  [provider]  vitamin B-12 (CYANOCOBALAMIN) 100 MCG tablet Take 50 mcg by mouth daily.     Yes [provider]  warfarin (COUMADIN) 5 MG tablet Take 2.5-5 mg by mouth daily. Take 1 tablet on Monday and Friday. Take 1/2 tablet all other days   Yes [provider]    Physical Exam: Vitals:   03/15/18 1554 03/15/18 1630 03/15/18 1635 03/15/18 1700  BP: (!) 120/55 125/63 125/63 (!) 141/58  Pulse: 65 66 67 62  Resp: 10 15 13 15   Temp:      SpO2: 97% 90% 100% 94%    Constitutional: NAD, calm, comfortable Vitals:   03/15/18 1554 03/15/18 1630 03/15/18 1635 03/15/18 1700  BP: (!) 120/55 125/63 125/63 (!) 141/58  Pulse: 65 66 67 62  Resp: 10 15 13 15   Temp:      SpO2: 97% 90% 100% 94%   Eyes: PERRL, lids and conjunctivae normal ENMT: Mucous membranes are dry. Posterior pharynx clear of any exudate or lesions.Normal dentition.  Neck: normal, supple, no masses, no thyromegaly Respiratory: clear  to auscultation bilaterally, no wheezing, no crackles. Normal respiratory effort. No accessory muscle use.  Cardiovascular: Regular rate and rhythm, no murmurs / rubs / gallops. No extremity edema. 2+ pedal pulses. No carotid bruits.  Abdomen: no tenderness, no masses palpated. No hepatosplenomegaly. Bowel sounds positive.  Musculoskeletal: no clubbing / cyanosis. No joint deformity upper and lower extremities. Good ROM, no contractures. Normal muscle tone.  Skin: no rashes, lesions, ulcers. No induration Neurologic: Receptive and expressive aphasia.  CN 2-12 grossly intact. Sensation intact, DTR normal. Strength 5/5 in all 4.  Finger-to-nose intact on the left.  Slight dysmetria on the right.  2+ bilateral upper extremity brachial radialis reflexes.  2+ patellar reflexes bilaterally.  Unable to elicit Achilles reflexes bilaterally.  Gait not tested secondary to safety. Psychiatric: Fair judgment and insight. Alert and oriented x 3. Normal mood.   Labs on Admission: I have  personally reviewed following labs and imaging studies  CBC: Recent Labs  Lab 03/15/18 1436 03/15/18 1442  WBC 5.8  --   NEUTROABS 4.2  --   HGB 12.5 12.9  HCT 38.6 38.0  MCV 94.8  --   PLT 235  --    Basic Metabolic Panel: Recent Labs  Lab 03/15/18 1436 03/15/18 1442  NA 137 136  K 4.0 3.9  CL 102 99  CO2 28  --   GLUCOSE 158* 152*  BUN 14 13  CREATININE 0.78 0.80  CALCIUM 10.2  --    GFR: CrCl cannot be calculated (Unknown ideal weight.). Liver Function Tests: Recent Labs  Lab 03/15/18 1436  AST 21  ALT 12  ALKPHOS 61  BILITOT 0.8  PROT 6.9  ALBUMIN 3.8   No results for input(s): LIPASE, AMYLASE in the last 168 hours. No results for input(s): AMMONIA in the last 168 hours. Coagulation Profile: Recent Labs  Lab 03/15/18 1436  INR 1.22   Cardiac Enzymes: No results for input(s): CKTOTAL, CKMB, CKMBINDEX, TROPONINI in the last 168 hours. BNP (last 3 results) No results for input(s): PROBNP in the last 8760 hours. HbA1C: No results for input(s): HGBA1C in the last 72 hours. CBG: No results for input(s): GLUCAP in the last 168 hours. Lipid Profile: No results for input(s): CHOL, HDL, LDLCALC, TRIG, CHOLHDL, LDLDIRECT in the last 72 hours. Thyroid Function Tests: No results for input(s): TSH, T4TOTAL, FREET4, T3FREE, THYROIDAB in the last 72 hours. Anemia Panel: No results for input(s): VITAMINB12, FOLATE, FERRITIN, TIBC, IRON, RETICCTPCT in the last 72 hours. Urine analysis:    Component Value Date/Time   COLORURINE STRAW (A) 08/27/2016 1313   APPEARANCEUR CLEAR 08/27/2016 1313   LABSPEC 1.003 (L) 08/27/2016 1313   PHURINE 7.0 08/27/2016 1313   GLUCOSEU NEGATIVE 08/27/2016 1313   HGBUR NEGATIVE 08/27/2016 1313   BILIRUBINUR NEGATIVE 08/27/2016 1313   KETONESUR NEGATIVE 08/27/2016 1313   PROTEINUR NEGATIVE 08/27/2016 1313   UROBILINOGEN 0.2 03/14/2009 1400   NITRITE NEGATIVE 08/27/2016 1313   LEUKOCYTESUR LARGE (A) 08/27/2016 1313     Radiological Exams on Admission: Ct Angio Head W Or Wo Contrast  Result Date: 03/15/2018 CLINICAL DATA:  Speech difficulty beginning 2 days ago. EXAM: CT ANGIOGRAPHY HEAD AND NECK TECHNIQUE: Multidetector CT imaging of the head and neck was performed using the standard protocol during bolus administration of intravenous contrast. Multiplanar CT image reconstructions and MIPs were obtained to evaluate the vascular anatomy. Carotid stenosis measurements (when applicable) are obtained utilizing NASCET criteria, using the distal internal carotid diameter as the denominator. CONTRAST:  62mL ISOVUE-370  IOPAMIDOL (ISOVUE-370) INJECTION 76% COMPARISON:  Head and neck MRA 11/21/2005 FINDINGS: CTA NECK FINDINGS Aortic arch: Incomplete imaging of the aortic arch including of the brachiocephalic artery origin. Widely patent left common carotid and left subclavian artery origins. Right carotid system: Patent with mild calcified and soft plaque in the carotid bulb. No evidence of significant stenosis or dissection. Retropharyngeal course of the proximal ICA. Left carotid system: Patent with mild calcified plaque at the carotid bifurcation. No evidence of stenosis or dissection. Vertebral arteries: Patent with the left vertebral artery being mildly dominant. New moderate right vertebral artery origin stenosis. No left vertebral artery stenosis. Skeleton: Moderately advanced disc degeneration at C5-6 and C6-7 and advanced left facet arthrosis at C4-5 with grade 1 anterolisthesis. Other neck: Subcentimeter calcified right thyroid nodule. Upper chest: Clear lung apices. Review of the MIP images confirms the above findings CTA HEAD FINDINGS Anterior circulation: The internal carotid arteries are patent from skull base to carotid termini with mild atherosclerosis and no evidence of significant stenosis. ACAs and MCAs are patent without evidence of proximal branch occlusion. There are severe proximal left M1 and moderate distal  right M1 stenoses which are new. A severe left M2 superior division origin stenosis may have progressed. There is also a moderate mid left M2 branch vessel stenosis. No aneurysm is identified. Posterior circulation: The intracranial vertebral arteries are patent to the basilar with mild irregularity but no significant stenosis. Patent PICA and SCA origins are identified bilaterally. The left AICA is patent while the right is small and not well evaluated. The basilar artery is widely patent. There is a small left posterior communicating artery which is likely heavily diseased. The PCAs are patent without evidence of flow limiting proximal stenosis. There is a mild proximal P2 stenosis on the left. No aneurysm is identified. Venous sinuses: Patent. Anatomic variants: None. Delayed phase: No abnormal enhancement. Review of the MIP images confirms the above findings IMPRESSION: 1. No emergent large vessel occlusion. 2. Progressive intracranial atherosclerosis with severe left M1 and M2 stenoses, moderate right M1 stenosis, and mild left P2 stenosis. 3. Widely patent cervical carotid arteries. 4. New moderate right vertebral artery origin stenosis. 5.  Aortic Atherosclerosis (ICD10-I70.0). These results were communicated to Dr. Leonel Ramsay at 3:05 pm on 03/15/2018 by text page via the [facility] messaging system. Electronically Signed   By: Logan Bores M.D.   On: 03/15/2018 15:28   Ct Angio Neck W Or Wo Contrast  Result Date: 03/15/2018 CLINICAL DATA:  Speech difficulty beginning 2 days ago. EXAM: CT ANGIOGRAPHY HEAD AND NECK TECHNIQUE: Multidetector CT imaging of the head and neck was performed using the standard protocol during bolus administration of intravenous contrast. Multiplanar CT image reconstructions and MIPs were obtained to evaluate the vascular anatomy. Carotid stenosis measurements (when applicable) are obtained utilizing NASCET criteria, using the distal internal carotid diameter as the denominator.  CONTRAST:  33mL ISOVUE-370 IOPAMIDOL (ISOVUE-370) INJECTION 76% COMPARISON:  Head and neck MRA 11/21/2005 FINDINGS: CTA NECK FINDINGS Aortic arch: Incomplete imaging of the aortic arch including of the brachiocephalic artery origin. Widely patent left common carotid and left subclavian artery origins. Right carotid system: Patent with mild calcified and soft plaque in the carotid bulb. No evidence of significant stenosis or dissection. Retropharyngeal course of the proximal ICA. Left carotid system: Patent with mild calcified plaque at the carotid bifurcation. No evidence of stenosis or dissection. Vertebral arteries: Patent with the left vertebral artery being mildly dominant. New moderate right vertebral artery origin stenosis. No  left vertebral artery stenosis. Skeleton: Moderately advanced disc degeneration at C5-6 and C6-7 and advanced left facet arthrosis at C4-5 with grade 1 anterolisthesis. Other neck: Subcentimeter calcified right thyroid nodule. Upper chest: Clear lung apices. Review of the MIP images confirms the above findings CTA HEAD FINDINGS Anterior circulation: The internal carotid arteries are patent from skull base to carotid termini with mild atherosclerosis and no evidence of significant stenosis. ACAs and MCAs are patent without evidence of proximal branch occlusion. There are severe proximal left M1 and moderate distal right M1 stenoses which are new. A severe left M2 superior division origin stenosis may have progressed. There is also a moderate mid left M2 branch vessel stenosis. No aneurysm is identified. Posterior circulation: The intracranial vertebral arteries are patent to the basilar with mild irregularity but no significant stenosis. Patent PICA and SCA origins are identified bilaterally. The left AICA is patent while the right is small and not well evaluated. The basilar artery is widely patent. There is a small left posterior communicating artery which is likely heavily diseased.  The PCAs are patent without evidence of flow limiting proximal stenosis. There is a mild proximal P2 stenosis on the left. No aneurysm is identified. Venous sinuses: Patent. Anatomic variants: None. Delayed phase: No abnormal enhancement. Review of the MIP images confirms the above findings IMPRESSION: 1. No emergent large vessel occlusion. 2. Progressive intracranial atherosclerosis with severe left M1 and M2 stenoses, moderate right M1 stenosis, and mild left P2 stenosis. 3. Widely patent cervical carotid arteries. 4. New moderate right vertebral artery origin stenosis. 5.  Aortic Atherosclerosis (ICD10-I70.0). These results were communicated to Dr. Leonel Ramsay at 3:05 pm on 03/15/2018 by text page via the [facility] messaging system. Electronically Signed   By: Logan Bores M.D.   On: 03/15/2018 15:28   Ct Head Code Stroke Wo Contrast  Result Date: 03/15/2018 CLINICAL DATA:  Code stroke. Speech difficulty beginning 2 days ago. EXAM: CT HEAD WITHOUT CONTRAST TECHNIQUE: Contiguous axial images were obtained from the base of the skull through the vertex without intravenous contrast. COMPARISON:  CT head without contrast 03/07/2017 FINDINGS: Brain: No acute infarct, hemorrhage, or mass lesion is present. Mild atrophy and white matter changes are present bilaterally. Remote left parietal lobe infarct is stable. No acute cortical abnormality is present. The basal ganglia are intact. Insular ribbon is normal. Insular ribbon is normal bilaterally. Cavum septum pellucidum is again noted. The ventricles are otherwise of normal size. The brainstem and cerebellum are normal. Vascular: Atherosclerotic calcifications are present in the cavernous internal carotid arteries. There is no hyperdense vessel. Skull: The skull base is normal. No focal lytic or blastic lesions are present. No significant extracranial soft tissue lesions are present. Sinuses/Orbits: The paranasal sinuses and mastoid air cells are clear. Bilateral lens  replacements are present. Globes and orbits are within normal limits. ASPECTS [facility] Stroke Program Early CT Score) - Ganglionic level infarction (caudate, lentiform nuclei, internal capsule, insula, M1-M3 cortex): 7/7 - Supraganglionic infarction (M4-M6 cortex): 3/3 Total score (0-10 with 10 being normal): 10/10 IMPRESSION: 1. No acute intracranial abnormality or significant interval change. 2. Mild atrophy and white matter disease is stable. 3. Remote left parietal lobe infarct is stable. 4. ASPECTS is 10/10 These results were called by telephone at the time of interpretation on 03/15/2018 at 2:53 pm to Dr. Gerlene Fee , who verbally acknowledged these results. Electronically Signed   By: San Morelle M.D.   On: 03/15/2018 14:54    EKG: Independently reviewed.  Normal sinus rhythm.  No ischemic changes noted.  Assessment/Plan Principal Problem:   CVA (cerebral vascular accident) [facility]) Active Problems:   ANEMIA   Renal artery stenosis ([place])   Essential hypertension   A-fib ([place])   #1 rule out CVA Patient presenting with both a receptive and expressive aphasia, orbital headache with some associated phonophobia and photophobia.  Patient with history of atrial fibrillation on chronic Coumadin therapy however INR subtherapeutic at 1.22 on admission.  Concern for an acute CVA.  CT head which was done was negative for any acute abnormalities.  CT angiogram head and neck was done which showed no emergent large vessel occlusion.  Progressive intracranial atherosclerosis with severe left M1 and M2 stenosis, moderate right M1 stenosis, and mild left P2 stenosis.  Widely patent cervical carotid arteries.  New moderate right vertebral artery origin stenosis.  Will admit the patient to [facility] for stroke work-up.  Check a MRI MRA of the head and neck.  Check carotid Dopplers, 2D echo, hemoglobin A1c, fasting lipid panel, troponin.  Gentle hydration normal saline 100 cc/h for the next 24 to 48 hours.   Permissive hypertension.  Hold Coumadin for now until MRI of the head is done.  Place on aspirin 325 mg daily, first dose now.  PT/OT/SLP.  Patient seen in consultation by neurology who are recommending patient be transferred to [facility] for stroke work-up.  2.  Atrial fibrillation EKG normal sinus rhythm.  Continue home regimen metoprolol for rate control.  Hold Coumadin until MRI of the head is done and per recommendations from neurology.  Follow for now.  3.  Hypertension Permissive hypertension.  We will continue patient's metoprolol for rate control for atrial fibrillation.  4.  Hyperlipidemia Patient did state was unable to tolerate statins due to myalgias as well as dysarthric speech.  Check a fasting lipid panel.  Follow.  5.  History of asthma Continue home regimen Claritin, Nasacort.  Place on nebs as needed.  6.  Headache/migraine Patient describing headaches with pain behind her eyes with some associated photophobia, phonophobia.  Symptoms concerning for migraine headache.  Patient states history of migraine headaches last headache about 16 years ago.  Place on Compazine and Ultram as needed for headache.  Follow.   DVT prophylaxis: Lovenox Code Status: Full Family Communication: Updated patient.  No family at bedside. Disposition Plan: Likely home once stroke work-up is completed and per neurology. Consults called: Neurology called by ED physician. Admission status: Admit to inpatient.   Irine Seal MD [facility] Pager [phone]  If 7PM-7AM, please contact night-coverage [email or web address] Password [facility]  03/15/2018, 5:31 PM

## 2018-03-15 NOTE — Progress Notes (Signed)
Received patient from [place] [facility] at around 1930, skin assessment completed, initiated Tele and verified, oriented to the room, personal items in reach, husband at bedside.

## 2018-03-16 ENCOUNTER — Inpatient Hospital Stay (HOSPITAL_COMMUNITY): Payer: Medicare Other

## 2018-03-16 DIAGNOSIS — I63412 Cerebral infarction due to embolism of left middle cerebral artery: Secondary | ICD-10-CM

## 2018-03-16 DIAGNOSIS — I5030 Unspecified diastolic (congestive) heart failure: Secondary | ICD-10-CM

## 2018-03-16 LAB — ECHOCARDIOGRAM COMPLETE
HEIGHTINCHES: 64 in
WEIGHTICAEL: 2447.99 [oz_av]

## 2018-03-16 LAB — LIPID PANEL
CHOLESTEROL: 182 mg/dL (ref 0–200)
HDL: 40 mg/dL — ABNORMAL LOW (ref >40)
LDL CALC: 125 mg/dL — AB (ref 0–99)
Total CHOL/HDL Ratio: 4.6 RATIO
Triglycerides: 85 mg/dL (ref <150)
VLDL: 17 mg/dL (ref 0–40)

## 2018-03-16 LAB — PROTIME-INR
INR: 1.58
Prothrombin Time: 18.7 seconds — ABNORMAL HIGH (ref 11.4–15.2)

## 2018-03-16 LAB — HEMOGLOBIN A1C
Hgb A1c MFr Bld: 5.8 % — ABNORMAL HIGH (ref 4.8–5.6)
Mean Plasma Glucose: 119.76 mg/dL

## 2018-03-16 MED ORDER — BUTALBITAL-APAP-CAFFEINE 50-325-40 MG PO TABS: 1.0000 | ORAL_TABLET | ORAL | Status: DC | PRN

## 2018-03-16 MED ORDER — WARFARIN - PHARMACIST DOSING INPATIENT: Freq: Every day | Status: DC

## 2018-03-16 MED ORDER — ATORVASTATIN CALCIUM 40 MG PO TABS: 40.0000 mg | ORAL_TABLET | Freq: Every day | ORAL | Status: DC

## 2018-03-16 MED ORDER — WARFARIN SODIUM 5 MG PO TABS
5.0000 mg | ORAL_TABLET | Freq: Once | ORAL | Status: AC
  Administered 2018-03-16: 5 mg via ORAL
  Filled 2018-03-16: qty 1

## 2018-03-16 MED ORDER — WARFARIN SODIUM 5 MG PO TABS: 5.0000 mg | ORAL_TABLET | Freq: Once | ORAL | Status: DC

## 2018-03-16 NOTE — Progress Notes (Addendum)
ANTICOAGULATION CONSULT NOTE - Initial Consult  Pharmacy Consult for warfarin Indication: atrial fibrillation   Allergies  Allergen Reactions  . Benazepril Hcl     cough  . Epinephrine     Shortness of breath and increased heart rate  . Lisinopril     Cough  . Oxycodone Nausea And Vomiting    Slurred speech, weakness, nausea  . Vytorin [Ezetimibe-Simvastatin]     Patient Measurements: Height: 5\' 4"  (162.6 cm) Weight: 153 lb (69.4 kg) IBW/kg (Calculated) : 54.7   Vital Signs: Temp: 98.3 F (36.8 C) (09/08 0838) Temp Source: Oral (09/08 0838) BP: 120/56 (09/08 0838) Pulse Rate: 67 (09/08 0838)  Labs: Recent Labs    03/15/18 1436 03/15/18 1442 03/15/18 2019 03/16/18 0336  HGB 12.5 12.9  --   --   HCT 38.6 38.0  --   --   PLT 235  --   --   --   APTT 32  --   --   --   LABPROT 15.3*  --   --  18.7*  INR 1.22  --   --  1.58  CREATININE 0.78 0.80  --   --   TROPONINI  --   --  <0.03  --     Estimated Creatinine Clearance: 52.8 mL/min (by C-G formula based on SCr of 0.8 mg/dL).   Medical History: Past Medical History:  Diagnosis Date  . A-fib ([place]) 03/15/2018  . Anal cancer ([place])   . Anxiety   . Arthritis   . Breast cancer ([place])   . Breast cancer ([place])   . Cancer of breast ([place])   . Depression   . Dysrhythmia 2010   a-fib  . Hyperlipidemia   . Hypertension   . PVD (peripheral vascular disease) ([place])   . RAS (renal artery stenosis) ([place])   . Stroke [facility])    no residual    Assessment: Head MRI 9/7 showing small, acute, nonhemorrhagic infarct of the left occipital lobe. No documented bleeding. Patient taking warfarin prior to admission for atrial fibrillation. Home warfarin regimen 5 mg M/F and 2.5 mg all other days of the week. Last reported dose 9/6 at 8 pm.   The patient's INR was subtherapeutic at 1.22 at admission and remains subtherapeutic this morning at 1.58. No documented bleeding and CBC stable and within normal limits  Goal of Therapy:  INR  2-3 Monitor platelets by anticoagulation protocol: Yes   Plan:  Warfarin 5 mg x 1 tonight  Monitor daily PT/INR, s/sx bleeding  Addendum: paged neurology MD regarding when to restart warfarin (currently only has orders for aspirin 325 mg daily). Dr. Erlinda Hong would like to restart warfarin today along with aspirin 325 mg daily until the patient's INR is above 2.0. After INR reaches 2.0, he advised that we can immediately stop the aspirin. Will continue with current plan to restart warfarin at 5 mg tonight.   Brendolyn Patty, PharmD PGY1 Pharmacy Resident Phone [phone]  03/16/2018   8:46 AM

## 2018-03-16 NOTE — Evaluation (Signed)
Physical Therapy Evaluation Patient Details Name: Jeanette [name] MRN: [id number] DOB: Nov 08, 1934 Today's Date: 03/16/2018   History of Present Illness  83 y.o. female with medical history significant of prior CVA per patient when her Coumadin was held for wrist surgery, history of atrial fibrillation on chronic anticoagulation, hypertension, hyperlipidemia, prior history of breast cancer and anal cancer, peripheral vascular disease, history of renal artery stenosis presenting to the ED with a 3-day history of headache and pain behind both eyes with associated photophobia, phonophobia.  MRI revealed: Small, acute, nonhemorrhagic infarct of the left occipital lobe.  Clinical Impression  Orders received for PT evaluation. Patient demonstrates deficits in functional mobility as indicated below. Will benefit from continued skilled PT to address deficits and maximize function. Will see as indicated and progress as tolerated.  At this time, some instability with activity but improved with distance.Recommend outpatient therapies to return to baseline function.    Follow Up Recommendations Outpatient PT    Equipment Recommendations  None recommended by PT    Recommendations for Other Services       Precautions / Restrictions Precautions Precautions: Fall      Mobility  Bed Mobility Overal bed mobility: Independent                Transfers Overall transfer level: Needs assistance Equipment used: None Transfers: Sit to/from Stand;Stand Pivot Transfers Sit to Stand: Supervision Stand pivot transfers: Min guard       General transfer comment: mildly unsteady   Ambulation/Gait Ambulation/Gait assistance: Supervision Gait Distance (Feet): 210 Feet Assistive device: None Gait Pattern/deviations: Step-through pattern;Decreased stride length;Narrow base of support Gait velocity: decreased speed Gait velocity interpretation: <1.8 ft/sec, indicate of risk for recurrent falls General  Gait Details: some noted instability during ambulation, improved with increased distance  Stairs Stairs: Yes Stairs assistance: Supervision Stair Management: Two rails Number of Stairs: 2 General stair comments: able to perform steps with supervision for safety  Wheelchair Mobility    Modified Rankin (Stroke Patients Only) Modified Rankin (Stroke Patients Only) Pre-Morbid Rankin Score: No symptoms Modified Rankin: Moderate disability     Balance Overall balance assessment: Needs assistance;Mild deficits observed, not formally tested   Sitting balance-Leahy Scale: Good       Standing balance-Leahy Scale: Fair               High level balance activites: Backward walking;Direction changes;Turns;Sudden stops;Head turns High Level Balance Comments: some instability with horizontal head turns and when lopoking down.              Pertinent Vitals/Pain Pain Assessment: 0-10 Pain Score: 4  Pain Location: headache  Pain Descriptors / Indicators: Headache;Pressure Pain Intervention(s): Monitored during session    Home Living Family/patient expects to be discharged to:: Private residence Living Arrangements: Spouse/significant other Available Help at Discharge: Family;Available PRN/intermittently Type of Home: House Home Access: Stairs to enter   [employer] of Steps: 1 Home Layout: One level Home Equipment: None      Prior Function Level of Independence: Independent         Comments: Pt drives, does yard work. Spouse reports "she has no more endurance".  Pt is retired Therapist, sports from [place] Hand: Right    Extremity/Trunk Assessment   Upper Extremity Assessment Upper Extremity Assessment: Overall WFL for tasks assessed    Lower Extremity Assessment Lower Extremity Assessment: Overall WFL for tasks assessed    Cervical / Trunk Assessment Cervical /  Trunk Assessment: Normal  Communication   Communication:  Expressive difficulties  Cognition Arousal/Alertness: Awake/alert Behavior During Therapy: WFL for tasks assessed/performed Overall Cognitive Status: Impaired/Different from baseline Area of Impairment: Attention;Awareness;Problem solving                   Current Attention Level: Alternating;Selective       Awareness: Emergent Problem Solving: Slow processing General Comments: Pt a bit slower than her baseline.   Will benefit from formal assessment       General Comments      Exercises     Assessment/Plan    PT Assessment Patient needs continued PT services  PT Problem List Decreased strength;Decreased activity tolerance;Decreased balance;Decreased mobility       PT Treatment Interventions Gait training;Stair training;Functional mobility training;Therapeutic activities;Therapeutic exercise;Balance training    PT Goals (Current goals can be found in the Care Plan section)  Acute Rehab PT Goals Patient Stated Goal: to get some sleep  PT Goal Formulation: With patient/family Time For Goal Achievement: 03/30/18 Potential to Achieve Goals: Good    Frequency Min 4X/week   Barriers to discharge        Co-evaluation               AM-PAC PT "6 Clicks" Daily Activity  Outcome Measure Difficulty turning over in bed (including adjusting bedclothes, sheets and blankets)?: None Difficulty moving from lying on back to sitting on the side of the bed? : None Difficulty sitting down on and standing up from a chair with arms (e.g., wheelchair, bedside commode, etc,.)?: None Help needed moving to and from a bed to chair (including a wheelchair)?: A Little Help needed walking in hospital room?: A Little Help needed climbing 3-5 steps with a railing? : A Little 6 Click Score: 21    End of Session Equipment Utilized During Treatment: Gait belt Activity Tolerance: Patient tolerated treatment well Patient left: in bed;with call bell/phone within reach;with  family/visitor present Nurse Communication: Mobility status PT Visit Diagnosis: Unsteadiness on feet (R26.81);Other symptoms and signs involving the nervous system (R29.898)    Time: 0254-2706 PT Time Calculation (min) (ACUTE ONLY): 18 min   Charges:   PT Evaluation $PT Eval Moderate Complexity: 1 Mod          Alben Deeds, PT DPT  Board Certified Neurologic Specialist [place] 03/16/2018, 1:35 PM

## 2018-03-16 NOTE — Progress Notes (Addendum)
PROGRESS NOTE    Jeanette [name]  IRW:[id number] DOB: 31-Mar-1935 DOA: 03/15/2018 PCP: Jeanette Infante, MD   Brief Narrative: Patient is 83 year old female with past medical history of CVA, A. fib on warfarin, hypertension, hyperlipidemia, history of breast cancer, history of anal cancer, peripheral vascular disease who presented to the emergency department with complaints of 3-day history of headache and pain behind eyes.  Patient also had expressive aphasia on presentation.  Code stroke was called.  MRI showed small, acute, nonhemorrhagic infarct of the left occipital lobe.  Neurology following.  Assessment & Plan:   Principal Problem:   CVA (cerebral vascular accident) [facility]) Active Problems:   ANEMIA   Renal artery stenosis ([employer])   Essential hypertension   A-fib ([facility])  Acute ischemic stroke: MRI as above.  Currently on aspirin 325 mg.  Warfarin has been on hold.  Restart as per neurology. PT/OT evaluation.  Awaiting echocardiogram and carotid Dopplers. Speech consultation. Patient still has expressive aphasia this morning.  Does not have any other focal neurological deficits.  Complains of headache and pain  behind the eyes. LDL is 125.  Hemoglobin A1c of 5.8.  A. fib: Currently in normal sinus rhythm.  Currently heart rate is controlled.  On metoprolol.  Was on warfarin at home.  INR was subtherapeutic on presentation.  Coumadin held as per neurology.  Hypertension: Currently blood pressure stable.  Continue metoprolol .Allow permissive hypertension.  Hyperlipidemia: Unable to tolerate statin due to myalgia in the past.  Will start her on Lipitor 40 mg daily.  History of asthma: Continue current meds  Headaches/migraine: Continues to complain of headache with pain behind her eyes.  Concern for migraine headache.  Will place order for Fioricet.   DVT prophylaxis: SCD Code Status: Full Family Communication: None present at the bedside Disposition Plan: Pending  work-up   Consultants: Neurology  Procedures: None  Antimicrobials: None  Subjective: Patient seen and examined the bedside this morning.  Hemodynamically stable.  Continues to complain of headache and pain behind the eyes.  Continues to have expressive aphasia, slow, expressive aphasia.  Objective: Vitals:   03/16/18 0100 03/16/18 0315 03/16/18 0424 03/16/18 0838  BP: 131/68 134/72 (!) 117/57 (!) 120/56  Pulse: 60 (!) 57 60 67  Resp: 18 18 18 16   Temp: 97.9 F (36.6 C) 97.7 F (36.5 C) 98.4 F (36.9 C) 98.3 F (36.8 C)  TempSrc: Oral Oral Oral Oral  SpO2: 99% 98% 94% 97%  Weight:      Height:        Intake/Output Summary (Last 24 hours) at 03/16/2018 1212 Last data filed at 03/16/2018 0800 Gross per 24 hour  Intake 1185.14 ml  Output -  Net 1185.14 ml   Filed Weights   03/15/18 1923  Weight: 69.4 kg    Examination:  General exam: Appears calm and comfortable ,Not in distress,average built HEENT:PERRL,Oral mucosa moist, Ear/Nose normal on gross exam Respiratory system: Bilateral equal air entry, normal vesicular breath sounds, no wheezes or crackles  Cardiovascular system: S1 & S2 heard, RRR. No JVD, murmurs, rubs, gallops or clicks. No pedal edema. Gastrointestinal system: Abdomen is nondistended, soft and nontender. No organomegaly or masses felt. Normal bowel sounds heard. Central nervous system: Alert and oriented. No focal neurological deficits.  Slpw speech, expressive aphasia Extremities: No edema, no clubbing ,no cyanosis, distal peripheral pulses palpable. Skin: No rashes, lesions or ulcers,no icterus ,no pallor MSK: Normal muscle bulk,tone ,power Psychiatry: Judgement and insight appear normal. Mood & affect appropriate.  Data Reviewed: I have personally reviewed following labs and imaging studies  CBC: Recent Labs  Lab 03/15/18 1436 03/15/18 1442  WBC 5.8  --   NEUTROABS 4.2  --   HGB 12.5 12.9  HCT 38.6 38.0  MCV 94.8  --   PLT 235  --     Basic Metabolic Panel: Recent Labs  Lab 03/15/18 1436 03/15/18 1442  NA 137 136  K 4.0 3.9  CL 102 99  CO2 28  --   GLUCOSE 158* 152*  BUN 14 13  CREATININE 0.78 0.80  CALCIUM 10.2  --    GFR: Estimated Creatinine Clearance: 52.8 mL/min (by C-G formula based on SCr of 0.8 mg/dL). Liver Function Tests: Recent Labs  Lab 03/15/18 1436  AST 21  ALT 12  ALKPHOS 61  BILITOT 0.8  PROT 6.9  ALBUMIN 3.8   No results for input(s): LIPASE, AMYLASE in the last 168 hours. No results for input(s): AMMONIA in the last 168 hours. Coagulation Profile: Recent Labs  Lab 03/15/18 1436 03/16/18 0336  INR 1.22 1.58   Cardiac Enzymes: Recent Labs  Lab 03/15/18 2019  TROPONINI <0.03   BNP (last 3 results) No results for input(s): PROBNP in the last 8760 hours. HbA1C: Recent Labs    03/16/18 0332  HGBA1C 5.8*   CBG: No results for input(s): GLUCAP in the last 168 hours. Lipid Profile: Recent Labs    03/16/18 0332  CHOL 182  HDL 40*  LDLCALC 125*  TRIG 85  CHOLHDL 4.6   Thyroid Function Tests: No results for input(s): TSH, T4TOTAL, FREET4, T3FREE, THYROIDAB in the last 72 hours. Anemia Panel: No results for input(s): VITAMINB12, FOLATE, FERRITIN, TIBC, IRON, RETICCTPCT in the last 72 hours. Sepsis Labs: No results for input(s): PROCALCITON, LATICACIDVEN in the last 168 hours.  No results found for this or any previous visit (from the past 240 hour(s)).       Radiology Studies: Ct Angio Head W Or Wo Contrast  Result Date: 03/15/2018 CLINICAL DATA:  Speech difficulty beginning 2 days ago. EXAM: CT ANGIOGRAPHY HEAD AND NECK TECHNIQUE: Multidetector CT imaging of the head and neck was performed using the standard protocol during bolus administration of intravenous contrast. Multiplanar CT image reconstructions and MIPs were obtained to evaluate the vascular anatomy. Carotid stenosis measurements (when applicable) are obtained utilizing NASCET criteria, using the  distal internal carotid diameter as the denominator. CONTRAST:  58mL ISOVUE-370 IOPAMIDOL (ISOVUE-370) INJECTION 76% COMPARISON:  Head and neck MRA 11/21/2005 FINDINGS: CTA NECK FINDINGS Aortic arch: Incomplete imaging of the aortic arch including of the brachiocephalic artery origin. Widely patent left common carotid and left subclavian artery origins. Right carotid system: Patent with mild calcified and soft plaque in the carotid bulb. No evidence of significant stenosis or dissection. Retropharyngeal course of the proximal ICA. Left carotid system: Patent with mild calcified plaque at the carotid bifurcation. No evidence of stenosis or dissection. Vertebral arteries: Patent with the left vertebral artery being mildly dominant. New moderate right vertebral artery origin stenosis. No left vertebral artery stenosis. Skeleton: Moderately advanced disc degeneration at C5-6 and C6-7 and advanced left facet arthrosis at C4-5 with grade 1 anterolisthesis. Other neck: Subcentimeter calcified right thyroid nodule. Upper chest: Clear lung apices. Review of the MIP images confirms the above findings CTA HEAD FINDINGS Anterior circulation: The internal carotid arteries are patent from skull base to carotid termini with mild atherosclerosis and no evidence of significant stenosis. ACAs and MCAs are patent without evidence of proximal  branch occlusion. There are severe proximal left M1 and moderate distal right M1 stenoses which are new. A severe left M2 superior division origin stenosis may have progressed. There is also a moderate mid left M2 branch vessel stenosis. No aneurysm is identified. Posterior circulation: The intracranial vertebral arteries are patent to the basilar with mild irregularity but no significant stenosis. Patent PICA and SCA origins are identified bilaterally. The left AICA is patent while the right is small and not well evaluated. The basilar artery is widely patent. There is a small left posterior  communicating artery which is likely heavily diseased. The PCAs are patent without evidence of flow limiting proximal stenosis. There is a mild proximal P2 stenosis on the left. No aneurysm is identified. Venous sinuses: Patent. Anatomic variants: None. Delayed phase: No abnormal enhancement. Review of the MIP images confirms the above findings IMPRESSION: 1. No emergent large vessel occlusion. 2. Progressive intracranial atherosclerosis with severe left M1 and M2 stenoses, moderate right M1 stenosis, and mild left P2 stenosis. 3. Widely patent cervical carotid arteries. 4. New moderate right vertebral artery origin stenosis. 5.  Aortic Atherosclerosis (ICD10-I70.0). These results were communicated to Dr. Leonel Ramsay at 3:05 pm on 03/15/2018 by text page via the [facility] messaging system. Electronically Signed   By: Logan Bores M.D.   On: 03/15/2018 15:28   Dg Chest 2 View  Result Date: 03/15/2018 CLINICAL DATA:  CVA yesterday EXAM: CHEST - 2 VIEW COMPARISON:  03/14/2009 FINDINGS: Mild hyperinflation. Normal heart size and pulmonary vascularity. No focal airspace disease or consolidation in the lungs. No blunting of costophrenic angles. No pneumothorax. Mediastinal contours appear intact. Slight fibrosis in the lung bases. Calcification of the aorta. IMPRESSION: No active cardiopulmonary disease. Electronically Signed   By: Lucienne Capers M.D.   On: 03/15/2018 23:51   Ct Angio Neck W Or Wo Contrast  Result Date: 03/15/2018 CLINICAL DATA:  Speech difficulty beginning 2 days ago. EXAM: CT ANGIOGRAPHY HEAD AND NECK TECHNIQUE: Multidetector CT imaging of the head and neck was performed using the standard protocol during bolus administration of intravenous contrast. Multiplanar CT image reconstructions and MIPs were obtained to evaluate the vascular anatomy. Carotid stenosis measurements (when applicable) are obtained utilizing NASCET criteria, using the distal internal carotid diameter as the denominator. CONTRAST:   26mL ISOVUE-370 IOPAMIDOL (ISOVUE-370) INJECTION 76% COMPARISON:  Head and neck MRA 11/21/2005 FINDINGS: CTA NECK FINDINGS Aortic arch: Incomplete imaging of the aortic arch including of the brachiocephalic artery origin. Widely patent left common carotid and left subclavian artery origins. Right carotid system: Patent with mild calcified and soft plaque in the carotid bulb. No evidence of significant stenosis or dissection. Retropharyngeal course of the proximal ICA. Left carotid system: Patent with mild calcified plaque at the carotid bifurcation. No evidence of stenosis or dissection. Vertebral arteries: Patent with the left vertebral artery being mildly dominant. New moderate right vertebral artery origin stenosis. No left vertebral artery stenosis. Skeleton: Moderately advanced disc degeneration at C5-6 and C6-7 and advanced left facet arthrosis at C4-5 with grade 1 anterolisthesis. Other neck: Subcentimeter calcified right thyroid nodule. Upper chest: Clear lung apices. Review of the MIP images confirms the above findings CTA HEAD FINDINGS Anterior circulation: The internal carotid arteries are patent from skull base to carotid termini with mild atherosclerosis and no evidence of significant stenosis. ACAs and MCAs are patent without evidence of proximal branch occlusion. There are severe proximal left M1 and moderate distal right M1 stenoses which are new. A severe left M2 superior  division origin stenosis may have progressed. There is also a moderate mid left M2 branch vessel stenosis. No aneurysm is identified. Posterior circulation: The intracranial vertebral arteries are patent to the basilar with mild irregularity but no significant stenosis. Patent PICA and SCA origins are identified bilaterally. The left AICA is patent while the right is small and not well evaluated. The basilar artery is widely patent. There is a small left posterior communicating artery which is likely heavily diseased. The PCAs are  patent without evidence of flow limiting proximal stenosis. There is a mild proximal P2 stenosis on the left. No aneurysm is identified. Venous sinuses: Patent. Anatomic variants: None. Delayed phase: No abnormal enhancement. Review of the MIP images confirms the above findings IMPRESSION: 1. No emergent large vessel occlusion. 2. Progressive intracranial atherosclerosis with severe left M1 and M2 stenoses, moderate right M1 stenosis, and mild left P2 stenosis. 3. Widely patent cervical carotid arteries. 4. New moderate right vertebral artery origin stenosis. 5.  Aortic Atherosclerosis (ICD10-I70.0). These results were communicated to Dr. Leonel Ramsay at 3:05 pm on 03/15/2018 by text page via the [facility] messaging system. Electronically Signed   By: Logan Bores M.D.   On: 03/15/2018 15:28   Mr Brain Wo Contrast  Result Date: 03/16/2018 CLINICAL DATA:  Aphasia and right hemianopia EXAM: MRI HEAD WITHOUT CONTRAST TECHNIQUE: Multiplanar, multiecho pulse sequences of the brain and surrounding structures were obtained without intravenous contrast. COMPARISON:  CTA head neck 03/15/2018 FINDINGS: BRAIN: Small foci of abnormal diffusion restriction within the left occipital lobe. No other site of ischemia. No acute hemorrhage. The midline structures are normal. There is an old left parietal lobe infarct. Mild periventricular white matter hyperintensity. The CSF spaces are normal for age, with no hydrocephalus. Susceptibility-sensitive sequences show no chronic microhemorrhage or superficial siderosis. VASCULAR: Major intracranial arterial and venous sinus flow voids are preserved. SKULL AND UPPER CERVICAL SPINE: The visualized skull base, calvarium, upper cervical spine and extracranial soft tissues are normal. SINUSES/ORBITS: No fluid levels or advanced mucosal thickening. No mastoid or middle ear effusion. The orbits are normal. IMPRESSION: Small, acute, nonhemorrhagic infarct of the left occipital lobe. Electronically  Signed   By: Ulyses Jarred M.D.   On: 03/16/2018 00:07   Ct Head Code Stroke Wo Contrast  Result Date: 03/15/2018 CLINICAL DATA:  Code stroke. Speech difficulty beginning 2 days ago. EXAM: CT HEAD WITHOUT CONTRAST TECHNIQUE: Contiguous axial images were obtained from the base of the skull through the vertex without intravenous contrast. COMPARISON:  CT head without contrast 03/07/2017 FINDINGS: Brain: No acute infarct, hemorrhage, or mass lesion is present. Mild atrophy and white matter changes are present bilaterally. Remote left parietal lobe infarct is stable. No acute cortical abnormality is present. The basal ganglia are intact. Insular ribbon is normal. Insular ribbon is normal bilaterally. Cavum septum pellucidum is again noted. The ventricles are otherwise of normal size. The brainstem and cerebellum are normal. Vascular: Atherosclerotic calcifications are present in the cavernous internal carotid arteries. There is no hyperdense vessel. Skull: The skull base is normal. No focal lytic or blastic lesions are present. No significant extracranial soft tissue lesions are present. Sinuses/Orbits: The paranasal sinuses and mastoid air cells are clear. Bilateral lens replacements are present. Globes and orbits are within normal limits. ASPECTS [facility] Stroke Program Early CT Score) - Ganglionic level infarction (caudate, lentiform nuclei, internal capsule, insula, M1-M3 cortex): 7/7 - Supraganglionic infarction (M4-M6 cortex): 3/3 Total score (0-10 with 10 being normal): 10/10 IMPRESSION: 1. No acute intracranial abnormality  or significant interval change. 2. Mild atrophy and white matter disease is stable. 3. Remote left parietal lobe infarct is stable. 4. ASPECTS is 10/10 These results were called by telephone at the time of interpretation on 03/15/2018 at 2:53 pm to Dr. Gerlene Fee , who verbally acknowledged these results. Electronically Signed   By: San Morelle M.D.   On: 03/15/2018 14:54         Scheduled Meds: . aspirin  325 mg Oral Daily  . escitalopram  10 mg Oral Daily  . metoprolol  100 mg Oral BID  . vitamin B-12  50 mcg Oral Daily  . Warfarin - Pharmacist Dosing Inpatient   Does not apply q1800   Continuous Infusions: . sodium chloride 50 mL/hr at 03/16/18 0600     LOS: 1 day    Time spent: 25 mins.More than 50% of that time was spent in counseling and/or coordination of care.      Shelly Coss, MD [facility] Pager [phone]  If 7PM-7AM, please contact night-coverage [email or web address] Password [email or web address] 03/16/2018, 12:12 PM

## 2018-03-16 NOTE — Evaluation (Signed)
Occupational Therapy Evaluation Patient Details Name: Jeanette [name] MRN: [id number] DOB: Jan 23, 1935 Today's Date: 03/16/2018    History of Present Illness 83 y.o. female with medical history significant of prior CVA per patient when her Coumadin was held for wrist surgery, history of atrial fibrillation on chronic anticoagulation, hypertension, hyperlipidemia, prior history of breast cancer and anal cancer, peripheral vascular disease, history of renal artery stenosis presenting to the ED with a 3-day history of headache and pain behind both eyes with associated photophobia, phonophobia.  MRI revealed: Small, acute, nonhemorrhagic infarct of the left occipital lobe.   Clinical Impression   Pt admitted with above. She demonstrates the below listed deficits and will benefit from continued OT to maximize safety and independence with BADLs.  Pt presents to OT with mild cognitive deficits - slow processing, and decreased alternating and divided attention, as well as visual deficits - blurriness with decreased reading speed, and mild balance deficits.  She requires min guard assist, overall for ADLs.  She lives with spouse and was independent with ADLs, yard work, and driving.  Pt is a retired Therapist, sports from [employer]. Recommend OPOT.       Follow Up Recommendations  Outpatient OT;Supervision/Assistance - 24 hour    Equipment Recommendations  Tub/shower seat    Recommendations for Other Services       Precautions / Restrictions Precautions Precautions: Fall Restrictions Weight Bearing Restrictions: No      Mobility Bed Mobility Overal bed mobility: Independent                Transfers Overall transfer level: Needs assistance Equipment used: None Transfers: Sit to/from Stand;Stand Pivot Transfers Sit to Stand: Supervision Stand pivot transfers: Min guard       General transfer comment: mildly unsteady     Balance Overall balance assessment: Mild deficits observed, not formally  tested                                         ADL either performed or assessed with clinical judgement   ADL Overall ADL's : Needs assistance/impaired Eating/Feeding: Independent   Grooming: Wash/dry hands;Wash/dry face;Oral care;Brushing hair;Min guard;Standing   Upper Body Bathing: Set up;Sitting   Lower Body Bathing: Min guard;Sit to/from stand   Upper Body Dressing : Set up;Sitting   Lower Body Dressing: Min guard;Sit to/from stand   Toilet Transfer: Min guard;Ambulation;Comfort height toilet   Toileting- Clothing Manipulation and Hygiene: Min guard;Sit to/from stand   Tub/ Shower Transfer: Walk-in shower;Min guard;Ambulation Tub/Shower Transfer Details (indicate cue type and reason): Pt mildly unsteady  Functional mobility during ADLs: Min guard       Vision Baseline Vision/History: Wears glasses Wears Glasses: Reading only Patient Visual Report: Blurring of vision;Eye fatigue/eye pain/headache Vision Assessment?: Yes Eye Alignment: Within Functional Limits Ocular Range of Motion: Within Functional Limits Alignment/Gaze Preference: Within Defined Limits Tracking/Visual Pursuits: Other (comment)(loses object on Rt ) Visual Fields: No apparent deficits Additional Comments: Pt reports "fuzziness"/blurriness.  Reading speed is significantly slower than normal, and pt reports difficulty focusing      Perception Perception Perception Tested?: Yes   Praxis Praxis Praxis tested?: Within functional limits    Pertinent Vitals/Pain Pain Assessment: 0-10 Pain Score: 4  Pain Location: headache  Pain Descriptors / Indicators: Headache;Pressure Pain Intervention(s): Monitored during session     Hand Dominance Right   Extremity/Trunk Assessment Upper Extremity Assessment Upper Extremity Assessment:  Overall [facility] for tasks assessed   Lower Extremity Assessment Lower Extremity Assessment: Defer to PT evaluation   Cervical / Trunk Assessment Cervical  / Trunk Assessment: Normal   Communication Communication Communication: Expressive difficulties   Cognition Arousal/Alertness: Awake/alert Behavior During Therapy: WFL for tasks assessed/performed Overall Cognitive Status: Impaired/Different from baseline Area of Impairment: Attention;Awareness;Problem solving                   Current Attention Level: Alternating;Selective       Awareness: Emergent Problem Solving: Slow processing General Comments: Pt a bit slower than her baseline.   Will benefit from formal assessment    General Comments       Exercises     Shoulder Instructions      Home Living Family/patient expects to be discharged to:: Private residence Living Arrangements: Spouse/significant other Available Help at Discharge: Family;Available PRN/intermittently Type of Home: House Home Access: Stairs to enter [employer] of Steps: 1   Home Layout: One level     Bathroom Shower/Tub: Occupational psychologist: Handicapped height     Home Equipment: None          Prior Functioning/Environment Level of Independence: Independent        Comments: Pt drives, does yard work. Spouse reports "she has no more endurance".  Pt is retired Therapist, sports from [place]         OT Problem List: Decreased activity tolerance;Impaired balance (sitting and/or standing);Decreased cognition;Pain      OT Treatment/Interventions: Self-care/ADL training;DME and/or AE instruction;Therapeutic activities;Cognitive remediation/compensation;Visual/perceptual remediation/compensation;Patient/family education;Balance training    OT Goals(Current goals can be found in the care plan section) Acute Rehab OT Goals Patient Stated Goal: to get some sleep  OT Goal Formulation: With patient/family Time For Goal Achievement: 03/30/18 Potential to Achieve Goals: Good ADL Goals Pt Will Perform Grooming: with modified independence;standing Pt Will Perform Upper Body  Bathing: with modified independence;standing Pt Will Perform Lower Body Bathing: with modified independence Pt Will Perform Upper Body Dressing: with modified independence;sitting;standing Pt Will Perform Lower Body Dressing: with modified independence;sit to/from stand Pt Will Transfer to Toilet: with modified independence;ambulating;regular height toilet;bedside commode;grab bars Pt Will Perform Toileting - Clothing Manipulation and hygiene: with modified independence;sit to/from stand Pt Will Perform Tub/Shower Transfer: Shower transfer;ambulating;shower seat Additional ADL Goal #1: Pt will be able to perform mod complex path finding with no cues  OT Frequency: Min 2X/week   Barriers to D/C:            Co-evaluation              AM-PAC PT "6 Clicks" Daily Activity     Outcome Measure Help from another person eating meals?: None Help from another person taking care of personal grooming?: A Little Help from another person toileting, which includes using toliet, bedpan, or urinal?: A Little Help from another person bathing (including washing, rinsing, drying)?: A Little Help from another person to put on and taking off regular upper body clothing?: A Little Help from another person to put on and taking off regular lower body clothing?: A Little 6 Click Score: 19   End of Session Equipment Utilized During Treatment: Gait belt Nurse Communication: Mobility status  Activity Tolerance: Patient tolerated treatment well Patient left: Other (comment)(with PT )  OT Visit Diagnosis: Unsteadiness on feet (R26.81)                Time: 4332-9518 OT Time Calculation (min): 30 min Charges:  OT  General Charges $OT Visit: 1 Visit OT Evaluation $OT Eval Moderate Complexity: 1 Mod OT Treatments $Self Care/Home Management : 8-22 mins  Lucille Passy, OTR/L [place] Pager [phone] Office [phone]   Lucille Passy M 03/16/2018, 11:09 AM

## 2018-03-16 NOTE — Plan of Care (Signed)
  Problem: Education: Goal: Knowledge of General Education information will improve Description Including pain rating scale, medication(s)/side effects and non-pharmacologic comfort measures Outcome: Progressing   Problem: Health Behavior/Discharge Planning: Goal: Ability to manage health-related needs will improve Outcome: Progressing   Problem: Clinical Measurements: Goal: Ability to maintain clinical measurements within normal limits will improve Outcome: Progressing Goal: Will remain free from infection Outcome: Progressing Goal: Diagnostic test results will improve Outcome: Progressing Goal: Respiratory complications will improve Outcome: Progressing Goal: Cardiovascular complication will be avoided Outcome: Progressing   Problem: Activity: Goal: Risk for activity intolerance will decrease Outcome: Progressing   Problem: Nutrition: Goal: Adequate nutrition will be maintained Outcome: Progressing   Problem: Coping: Goal: Level of anxiety will decrease Outcome: Progressing   Problem: Elimination: Goal: Will not experience complications related to bowel motility Outcome: Progressing Goal: Will not experience complications related to urinary retention Outcome: Progressing   Problem: Pain Managment: Goal: General experience of comfort will improve Outcome: Progressing   Problem: Safety: Goal: Ability to remain free from injury will improve Outcome: Progressing   Problem: Skin Integrity: Goal: Risk for impaired skin integrity will decrease Outcome: Progressing   Problem: Education: Goal: Knowledge of disease or condition will improve Outcome: Progressing Goal: Knowledge of secondary prevention will improve Outcome: Progressing Goal: Individualized Educational Video(s) Outcome: Progressing   Problem: Health Behavior/Discharge Planning: Goal: Ability to manage health-related needs will improve Outcome: Progressing   Problem: Self-Care: Goal: Ability to  participate in self-care as condition permits will improve Outcome: Progressing   Problem: Ischemic Stroke/TIA Tissue Perfusion: Goal: Complications of ischemic stroke/TIA will be minimized Outcome: Progressing

## 2018-03-16 NOTE — Progress Notes (Addendum)
STROKE TEAM PROGRESS NOTE   SUBJECTIVE (INTERVAL HISTORY) Her husband is at the bedside.  Pt said her INR on 02/19/18 was 2.3. She does not know why her INR was low this time. Her INR usually stable. She wants to continue coumadin at this time. But willing to read more about DOACs and discuss with her PCP.     OBJECTIVE Vitals:   03/16/18 0100 03/16/18 0315 03/16/18 0424 03/16/18 0838  BP: 131/68 134/72 (!) 117/57 (!) 120/56  Pulse: 60 (!) 57 60 67  Resp: 18 18 18 16   Temp: 97.9 F (36.6 C) 97.7 F (36.5 C) 98.4 F (36.9 C) 98.3 F (36.8 C)  TempSrc: Oral Oral Oral Oral  SpO2: 99% 98% 94% 97%  Weight:      Height:        CBC:  Recent Labs  Lab 03/15/18 1436 03/15/18 1442  WBC 5.8  --   NEUTROABS 4.2  --   HGB 12.5 12.9  HCT 38.6 38.0  MCV 94.8  --   PLT 235  --     Basic Metabolic Panel:  Recent Labs  Lab 03/15/18 1436 03/15/18 1442  NA 137 136  K 4.0 3.9  CL 102 99  CO2 28  --   GLUCOSE 158* 152*  BUN 14 13  CREATININE 0.78 0.80  CALCIUM 10.2  --     Lipid Panel:     Component Value Date/Time   CHOL 182 03/16/2018 0332   TRIG 85 03/16/2018 0332   HDL 40 (L) 03/16/2018 0332   CHOLHDL 4.6 03/16/2018 0332   VLDL 17 03/16/2018 0332   LDLCALC 125 (H) 03/16/2018 0332   HgbA1c:  Lab Results  Component Value Date   HGBA1C 5.8 (H) 03/16/2018   Urine Drug Screen:     Component Value Date/Time   LABOPIA POSITIVE (A) 03/15/2018 2052   COCAINSCRNUR NONE DETECTED 03/15/2018 2052   LABBENZ NONE DETECTED 03/15/2018 2052   AMPHETMU NONE DETECTED 03/15/2018 2052   THCU NONE DETECTED 03/15/2018 2052   LABBARB NONE DETECTED 03/15/2018 2052    Alcohol Level     Component Value Date/Time   ETH <10 03/15/2018 1436    IMAGING  Ct Angio Head W Or Wo Contrast Ct Angio Neck W Or Wo Contrast 03/15/2018 IMPRESSION:  1. No emergent large vessel occlusion.  2. Progressive intracranial atherosclerosis with severe left M1 and M2 stenoses, moderate right M1  stenosis, and mild left P2 stenosis.  3. Widely patent cervical carotid arteries.  4. New moderate right vertebral artery origin stenosis.  5.  Aortic Atherosclerosis (ICD10-I70.0).   Dg Chest 2 View 03/15/2018 IMPRESSION:  No active cardiopulmonary disease.   Mr Brain Wo Contrast 03/16/2018 IMPRESSION:  Small, acute, nonhemorrhagic infarct of the left occipital lobe.   Ct Head Code Stroke Wo Contrast 03/15/2018 IMPRESSION:  1. No acute intracranial abnormality or significant interval change.  2. Mild atrophy and white matter disease is stable.  3. Remote left parietal lobe infarct is stable.  4. ASPECTS is 10/10     Transthoracic Echocardiogram - pending 00/00/00     PHYSICAL EXAM Blood pressure (!) 120/56, pulse 67, temperature 98.3 F (36.8 C), temperature source Oral, resp. rate 16, height 5\' 4"  (1.626 m), weight 69.4 kg, SpO2 97 %.   Temp:  [97.7 F (36.5 C)-98.7 F (37.1 C)] 98.3 F (36.8 C) (09/08 0838) Pulse Rate:  [55-87] 67 (09/08 0838) Resp:  [10-20] 16 (09/08 0838) BP: (117-146)/(50-72) 120/56 (09/08 0838) SpO2:  [90 %-  100 %] 97 % (09/08 0838) Weight:  [69.4 kg] 69.4 kg (09/07 1923)  General - Well nourished, well developed, in no apparent distress.  Ophthalmologic - fundi not visualized due to noncooperation.  Cardiovascular - Regular rate and rhythm, not in afib.  Mental Status -  Level of arousal and orientation to time, place, and person were intact. Language including expression, naming, repetition, comprehension was assessed and found intact. Fund of Knowledge was assessed and was intact.  Cranial Nerves II - XII - II - right hemianopia, upper > lower quadrant, can see shadows on the right visual field intermittently. III, IV, VI - Extraocular movements intact. V - Facial sensation intact bilaterally. VII - Facial movement intact bilaterally. VIII - Hearing & vestibular intact bilaterally. X - Palate elevates symmetrically XI - Chin turning  & shoulder shrug intact bilaterally. XII - Tongue protrusion intact.  Motor Strength - The patient's strength was normal in all extremities and pronator drift was absent.  Bulk was normal and fasciculations were absent.   Motor Tone - Muscle tone was assessed at the neck and appendages and was normal.  Reflexes - The patient's reflexes were symmetrical in all extremities and she had no pathological reflexes.  Sensory - Light touch, temperature/pinprick were assessed and were symmetrical.    Coordination - The patient had normal movements in the hands and feet with no ataxia or dysmetria.  Tremor was absent.  Gait and Station - deferred.     [name] is a 83 y.o. female with history of a previous stroke, renal artery stenosis, hypertension, hyperlipidemia, breast cancer, anxiety, and atrial fibrillation on coumadin (INR 1.22) presenting with aphasia and right hemianopia. She did not receive IV t-PA due to late presentation.   Stroke:  Small acute infarct left occipital lobe - embolic - afib on coumadin with subtherapeutic INR  Resultant  Right hemianopia  CT head - no acute findings. Remote left parietal lobe infarct is stable.   MRI head - Small acute infarct of the left occipital lobe.   CTA H&N - severe left M1 and M2 stenoses, moderate right M1 stenosis  Carotid Doppler - CTA neck performed - carotid dopplers not indicated.  2D Echo - pending  LDL - 125  HgbA1c - 5.8  VTE prophylaxis - SCDs  Diet  - Heart healthy with thin liquids.  warfarin daily prior to admission, now on aspirin 325 mg daily and warfarin daily. Continue coumadin and INR goal 2-3. ASA 325mg  can be discontinued once INR at goal. Pt does not want DOACs at this time, but willing to read about them and discuss with her PCP  Patient counseled to be compliant with her antithrombotic medications  Ongoing aggressive stroke risk factor management  Therapy recommendations:  Outpt  OT  Disposition:  Pending  PAF  On Coumadin at home  Last INR check 02/19/2018 was 2.3  General speaking INR stable over the years  On admission INR 1.22, today 1.58  Given small size of stroke, recommend to continue coumadin and INR goal 2-3. ASA 325mg  can be discontinued once INR at goal. Pt does not want DOACs at this time, but willing to read about them and discuss with her PCP  Hypertension  Stable . Permissive hypertension (OK if <180/105) but gradually normalize in 5-7 days . Long-term BP goal normotensive  Hyperlipidemia  Lipid lowering medication PTA:  none  LDL 125, goal < 70  Current lipid lowering medication: Lipitor 40 mg daily  Continue  statin at discharge  Other Stroke Risk Factors  Advanced age  ETOH use, advised to drink no more than 1 alcoholic beverage per day.  Hx stroke/TIA - Remote left parietal lobe infarct   PVD  Other Active Problems  History of breast cance  Pt cannot drive until cleared by her ophthalmologist when hemianopia improves  Hospital day # 1  Neurology will sign off. Please call with questions. Pt will follow up with stroke clinic NP at [facility] in about 4 weeks. Thanks for the consult.  Rosalin Hawking, MD PhD Stroke Neurology 03/16/2018 2:41 PM   To contact Stroke Continuity provider, please refer to [email or web address] After hours, contact General Neurology

## 2018-03-16 NOTE — Progress Notes (Signed)
  Echocardiogram 2D Echocardiogram has been performed.  Jeanette [name] F 03/16/2018, 2:37 PM

## 2018-03-17 LAB — CBC
HCT: 33.8 % — ABNORMAL LOW (ref 36.0–46.0)
HEMOGLOBIN: 10.5 g/dL — AB (ref 12.0–15.0)
MCH: 29.8 pg (ref 26.0–34.0)
MCHC: 31.1 g/dL (ref 30.0–36.0)
MCV: 96 fL (ref 78.0–100.0)
PLATELETS: 185 10*3/uL (ref 150–400)
RBC: 3.52 MIL/uL — AB (ref 3.87–5.11)
RDW: 14.3 % (ref 11.5–15.5)
WBC: 4.2 10*3/uL (ref 4.0–10.5)

## 2018-03-17 LAB — PROTIME-INR
INR: 1.43
Prothrombin Time: 17.3 seconds — ABNORMAL HIGH (ref 11.4–15.2)

## 2018-03-17 LAB — URINE CULTURE

## 2018-03-17 MED ORDER — WARFARIN SODIUM 5 MG PO TABS
10.0000 mg | ORAL_TABLET | ORAL | Status: AC
  Administered 2018-03-17: 10 mg via ORAL
  Filled 2018-03-17: qty 2

## 2018-03-17 MED ORDER — ATORVASTATIN CALCIUM 40 MG PO TABS: 40.0000 mg | ORAL_TABLET | Freq: Every day | ORAL | 0 refills | Status: DC

## 2018-03-17 MED ORDER — ASPIRIN 325 MG PO TABS: 325.0000 mg | ORAL_TABLET | Freq: Every day | ORAL | 0 refills | Status: DC

## 2018-03-17 NOTE — Discharge Summary (Signed)
Physician Discharge Summary  Jeanette [name] HYW:[id number] DOB: [birth year] DOA: 03/15/2018  PCP: Crist Infante, MD  Admit date: 03/15/2018 Discharge date: 03/17/2018  Admitted From: Home Disposition:  Home  Discharge Condition:Stable CODE STATUS:FULL Diet recommendation: Heart Healthy  Brief/Interim Summary: Patient is 83 year old female with past medical history of CVA, A. fib on warfarin, hypertension, hyperlipidemia, history of breast cancer, history of anal cancer, peripheral vascular disease who presented to the emergency department with complaints of 3-day history of headache and pain behind eyes.  Patient also had expressive aphasia on presentation.  Code stroke was called.  MRI showed small, acute, nonhemorrhagic infarct of the left occipital lobe.  Neurology was following.  Patient was seen by PT/OT and recommended outpatient PT follow-up.  Currently she is hemodynamically stable for discharge.  She should continue both warfarin and aspirin 325 mg at home until her INR reaches 2-3 after that she can discontinue her aspirin.  Patient should follow-up with neurology as an outpatient.  Following problems were addressed during her hospitalization:  Acute ischemic stroke: MRI as above.    Patient doesnot have any residual weakness.  Currently on aspirin 325 mg. Warfarin restarted. PT/OT evaluation done and recommended outpatient PT.  Echocardiogram showed normal ejection fraction, grade 2 diastolic dysfunction.  No significant carotid artery stenosis as per carotid Dopplers. Speech consultation done,no specific recommendation. Her expressive aphasia has significantly improved this morning. Headache and pain  behind the eyes resolved. LDL is 125.  Hemoglobin A1c of 5.8. Continue statin on discharge.  A. fib: Currently in normal sinus rhythm.  Currently heart rate is controlled.  On metoprolol.  Was on warfarin at home.  INR was subtherapeutic on presentation.  Coumadin resumed.  She should  check her INR in 2 days by following up with her PCP.  Hypertension: Currently blood pressure stable.  Continue metoprolol.  Hyperlipidemia: Unable to tolerate statin due to myalgia in the past.  Will start her on Lipitor 40 mg daily.  History of asthma: Continue current meds  Headaches/migraine: Much improved.  Right hemianopia: Follow-up with ophthalmologist outpatient.  Patient should not drive until seen and cleared by ophthalmology.  Discharge Diagnoses:  Principal Problem:   CVA (cerebral vascular accident) [facility]) Active Problems:   ANEMIA   Renal artery stenosis ([place])   Essential hypertension   A-fib [facility])    Discharge Instructions  Discharge Instructions    Ambulatory referral to Neurology   Complete by:  As directed    Follow up with stroke clinic NP (Jessica Vanschaick or Cecille Rubin, if both not available, consider Zachery Dauer, or [place]) at [facility] in about 4 weeks. Thanks.   Diet - low sodium heart healthy   Complete by:  As directed    Discharge instructions   Complete by:  As directed    1) Please follow up with your PCP in 2 days to check INR. Continue both aspirin 325 mg and warfarin at your usual dose.  When INR reaches between 2-3, you can discontinue taking aspirin. 2)Follow up with neurology as an outpatient in 4 weeks.  Name and number the provider has been attached. 3)Please do not drive until you are seen by an ophthalmologist.  Please follow-up with ophthalmology as an outpatient within a week.  Name and number of the provider has been attached.   Increase activity slowly   Complete by:  As directed      Allergies as of 03/17/2018      Reactions   Benazepril Hcl  cough   Epinephrine    Shortness of breath and increased heart rate   Lisinopril    Cough   Oxycodone Nausea And Vomiting   Slurred speech, weakness, nausea   Vytorin [ezetimibe-simvastatin]       Medication List    TAKE these medications   aspirin 325 MG tablet Take 1  tablet (325 mg total) by mouth daily. Start taking on:  03/18/2018   atorvastatin 40 MG tablet Commonly known as:  LIPITOR Take 1 tablet (40 mg total) by mouth daily at 6 PM.   cholecalciferol 1000 units tablet Commonly known as:  VITAMIN D Take 1,000 Units by mouth daily.   COQ10 PO Take 1 capsule by mouth daily.   escitalopram 10 MG tablet Commonly known as:  LEXAPRO Take 10 mg by mouth daily.   HYDROcodone-acetaminophen 5-325 MG tablet Commonly known as:  NORCO/VICODIN Take 1 tablet by mouth every 6 (six) hours as needed for moderate pain.   loratadine 10 MG tablet Commonly known as:  CLARITIN Take 10 mg by mouth daily as needed for allergies.   metoprolol 200 MG 24 hr tablet Commonly known as:  TOPROL-XL Take 100 mg by mouth 2 (two) times daily.   NASACORT ALLERGY 24HR 55 MCG/ACT Aero nasal inhaler Generic drug:  triamcinolone Place 2 sprays into the nose as needed (sinus pressure).   olmesartan 20 MG tablet Commonly known as:  BENICAR Take 10 mg by mouth daily.   vitamin B-12 100 MCG tablet Commonly known as:  CYANOCOBALAMIN Take 50 mcg by mouth daily.   warfarin 5 MG tablet Commonly known as:  COUMADIN Take 2.5-5 mg by mouth daily. Take 1 tablet on Monday and Friday. Take 1/2 tablet all other days      Follow-up Information    [facility]. Schedule an appointment as soon as possible for a visit in 4 week(s).   Specialty:  Radiology Contact information: [address] [place] [phone]       Jalene Mullet, MD. Schedule an appointment as soon as possible for a visit in 1 week(s).   Specialty:  Ophthalmology Contact information: [address] [place] [phone]        Crist Infante, MD. Schedule an appointment as soon as possible for a visit in 2 day(s).   Specialty:  Internal Medicine Contact information: [place]  [address] [phone]          Allergies  Allergen Reactions  . Benazepril Hcl     cough  . Epinephrine     Shortness of breath and increased heart rate  . Lisinopril     Cough  . Oxycodone Nausea And Vomiting    Slurred speech, weakness, nausea  . Vytorin [Ezetimibe-Simvastatin]     Consultations: neurology  Procedures/Studies: Ct Angio Head W Or Wo Contrast  Result Date: 03/15/2018 CLINICAL DATA:  Speech difficulty beginning 2 days ago. EXAM: CT ANGIOGRAPHY HEAD AND NECK TECHNIQUE: Multidetector CT imaging of the head and neck was performed using the standard protocol during bolus administration of intravenous contrast. Multiplanar CT image reconstructions and MIPs were obtained to evaluate the vascular anatomy. Carotid stenosis measurements (when applicable) are obtained utilizing NASCET criteria, using the distal internal carotid diameter as the denominator. CONTRAST:  82mL ISOVUE-370 IOPAMIDOL (ISOVUE-370) INJECTION 76% COMPARISON:  Head and neck MRA 11/21/2005 FINDINGS: CTA NECK FINDINGS Aortic arch: Incomplete imaging of the aortic arch including of the brachiocephalic artery origin.  Widely patent left common carotid and left subclavian artery origins. Right carotid system: Patent with mild calcified and soft plaque in the carotid bulb. No evidence of significant stenosis or dissection. Retropharyngeal course of the proximal ICA. Left carotid system: Patent with mild calcified plaque at the carotid bifurcation. No evidence of stenosis or dissection. Vertebral arteries: Patent with the left vertebral artery being mildly dominant. New moderate right vertebral artery origin stenosis. No left vertebral artery stenosis. Skeleton: Moderately advanced disc degeneration at C5-6 and C6-7 and advanced left facet arthrosis at C4-5 with grade 1 anterolisthesis. Other neck: Subcentimeter calcified right thyroid nodule. Upper chest: Clear lung apices. Review of the MIP images confirms the above  findings CTA HEAD FINDINGS Anterior circulation: The internal carotid arteries are patent from skull base to carotid termini with mild atherosclerosis and no evidence of significant stenosis. ACAs and MCAs are patent without evidence of proximal branch occlusion. There are severe proximal left M1 and moderate distal right M1 stenoses which are new. A severe left M2 superior division origin stenosis may have progressed. There is also a moderate mid left M2 branch vessel stenosis. No aneurysm is identified. Posterior circulation: The intracranial vertebral arteries are patent to the basilar with mild irregularity but no significant stenosis. Patent PICA and SCA origins are identified bilaterally. The left AICA is patent while the right is small and not well evaluated. The basilar artery is widely patent. There is a small left posterior communicating artery which is likely heavily diseased. The PCAs are patent without evidence of flow limiting proximal stenosis. There is a mild proximal P2 stenosis on the left. No aneurysm is identified. Venous sinuses: Patent. Anatomic variants: None. Delayed phase: No abnormal enhancement. Review of the MIP images confirms the above findings IMPRESSION: 1. No emergent large vessel occlusion. 2. Progressive intracranial atherosclerosis with severe left M1 and M2 stenoses, moderate right M1 stenosis, and mild left P2 stenosis. 3. Widely patent cervical carotid arteries. 4. New moderate right vertebral artery origin stenosis. 5.  Aortic Atherosclerosis (ICD10-I70.0). These results were communicated to Dr. Leonel Ramsay at 3:05 pm on 03/15/2018 by text page via the [facility] messaging system. Electronically Signed   By: Logan Bores M.D.   On: 03/15/2018 15:28   Dg Chest 2 View  Result Date: 03/15/2018 CLINICAL DATA:  CVA yesterday EXAM: CHEST - 2 VIEW COMPARISON:  03/14/2009 FINDINGS: Mild hyperinflation. Normal heart size and pulmonary vascularity. No focal airspace disease or consolidation  in the lungs. No blunting of costophrenic angles. No pneumothorax. Mediastinal contours appear intact. Slight fibrosis in the lung bases. Calcification of the aorta. IMPRESSION: No active cardiopulmonary disease. Electronically Signed   By: Lucienne Capers M.D.   On: 03/15/2018 23:51   Ct Angio Neck W Or Wo Contrast  Result Date: 03/15/2018 CLINICAL DATA:  Speech difficulty beginning 2 days ago. EXAM: CT ANGIOGRAPHY HEAD AND NECK TECHNIQUE: Multidetector CT imaging of the head and neck was performed using the standard protocol during bolus administration of intravenous contrast. Multiplanar CT image reconstructions and MIPs were obtained to evaluate the vascular anatomy. Carotid stenosis measurements (when applicable) are obtained utilizing NASCET criteria, using the distal internal carotid diameter as the denominator. CONTRAST:  65mL ISOVUE-370 IOPAMIDOL (ISOVUE-370) INJECTION 76% COMPARISON:  Head and neck MRA 11/21/2005 FINDINGS: CTA NECK FINDINGS Aortic arch: Incomplete imaging of the aortic arch including of the brachiocephalic artery origin. Widely patent left common carotid and left subclavian artery origins. Right carotid system: Patent with mild calcified and soft plaque in the  carotid bulb. No evidence of significant stenosis or dissection. Retropharyngeal course of the proximal ICA. Left carotid system: Patent with mild calcified plaque at the carotid bifurcation. No evidence of stenosis or dissection. Vertebral arteries: Patent with the left vertebral artery being mildly dominant. New moderate right vertebral artery origin stenosis. No left vertebral artery stenosis. Skeleton: Moderately advanced disc degeneration at C5-6 and C6-7 and advanced left facet arthrosis at C4-5 with grade 1 anterolisthesis. Other neck: Subcentimeter calcified right thyroid nodule. Upper chest: Clear lung apices. Review of the MIP images confirms the above findings CTA HEAD FINDINGS Anterior circulation: The internal  carotid arteries are patent from skull base to carotid termini with mild atherosclerosis and no evidence of significant stenosis. ACAs and MCAs are patent without evidence of proximal branch occlusion. There are severe proximal left M1 and moderate distal right M1 stenoses which are new. A severe left M2 superior division origin stenosis may have progressed. There is also a moderate mid left M2 branch vessel stenosis. No aneurysm is identified. Posterior circulation: The intracranial vertebral arteries are patent to the basilar with mild irregularity but no significant stenosis. Patent PICA and SCA origins are identified bilaterally. The left AICA is patent while the right is small and not well evaluated. The basilar artery is widely patent. There is a small left posterior communicating artery which is likely heavily diseased. The PCAs are patent without evidence of flow limiting proximal stenosis. There is a mild proximal P2 stenosis on the left. No aneurysm is identified. Venous sinuses: Patent. Anatomic variants: None. Delayed phase: No abnormal enhancement. Review of the MIP images confirms the above findings IMPRESSION: 1. No emergent large vessel occlusion. 2. Progressive intracranial atherosclerosis with severe left M1 and M2 stenoses, moderate right M1 stenosis, and mild left P2 stenosis. 3. Widely patent cervical carotid arteries. 4. New moderate right vertebral artery origin stenosis. 5.  Aortic Atherosclerosis (ICD10-I70.0). These results were communicated to Dr. Leonel Ramsay at 3:05 pm on 03/15/2018 by text page via the [facility] messaging system. Electronically Signed   By: Logan Bores M.D.   On: 03/15/2018 15:28   Mr Brain Wo Contrast  Result Date: 03/16/2018 CLINICAL DATA:  Aphasia and right hemianopia EXAM: MRI HEAD WITHOUT CONTRAST TECHNIQUE: Multiplanar, multiecho pulse sequences of the brain and surrounding structures were obtained without intravenous contrast. COMPARISON:  CTA head neck 03/15/2018  FINDINGS: BRAIN: Small foci of abnormal diffusion restriction within the left occipital lobe. No other site of ischemia. No acute hemorrhage. The midline structures are normal. There is an old left parietal lobe infarct. Mild periventricular white matter hyperintensity. The CSF spaces are normal for age, with no hydrocephalus. Susceptibility-sensitive sequences show no chronic microhemorrhage or superficial siderosis. VASCULAR: Major intracranial arterial and venous sinus flow voids are preserved. SKULL AND UPPER CERVICAL SPINE: The visualized skull base, calvarium, upper cervical spine and extracranial soft tissues are normal. SINUSES/ORBITS: No fluid levels or advanced mucosal thickening. No mastoid or middle ear effusion. The orbits are normal. IMPRESSION: Small, acute, nonhemorrhagic infarct of the left occipital lobe. Electronically Signed   By: Ulyses Jarred M.D.   On: 03/16/2018 00:07   Ct Head Code Stroke Wo Contrast  Result Date: 03/15/2018 CLINICAL DATA:  Code stroke. Speech difficulty beginning 2 days ago. EXAM: CT HEAD WITHOUT CONTRAST TECHNIQUE: Contiguous axial images were obtained from the base of the skull through the vertex without intravenous contrast. COMPARISON:  CT head without contrast 03/07/2017 FINDINGS: Brain: No acute infarct, hemorrhage, or mass lesion is present. Mild  atrophy and white matter changes are present bilaterally. Remote left parietal lobe infarct is stable. No acute cortical abnormality is present. The basal ganglia are intact. Insular ribbon is normal. Insular ribbon is normal bilaterally. Cavum septum pellucidum is again noted. The ventricles are otherwise of normal size. The brainstem and cerebellum are normal. Vascular: Atherosclerotic calcifications are present in the cavernous internal carotid arteries. There is no hyperdense vessel. Skull: The skull base is normal. No focal lytic or blastic lesions are present. No significant extracranial soft tissue lesions are  present. Sinuses/Orbits: The paranasal sinuses and mastoid air cells are clear. Bilateral lens replacements are present. Globes and orbits are within normal limits. ASPECTS [facility] Stroke Program Early CT Score) - Ganglionic level infarction (caudate, lentiform nuclei, internal capsule, insula, M1-M3 cortex): 7/7 - Supraganglionic infarction (M4-M6 cortex): 3/3 Total score (0-10 with 10 being normal): 10/10 IMPRESSION: 1. No acute intracranial abnormality or significant interval change. 2. Mild atrophy and white matter disease is stable. 3. Remote left parietal lobe infarct is stable. 4. ASPECTS is 10/10 These results were called by telephone at the time of interpretation on 03/15/2018 at 2:53 pm to Dr. Gerlene Fee , who verbally acknowledged these results. Electronically Signed   By: San Morelle M.D.   On: 03/15/2018 14:54       Subjective: Patient seen and examined the pressure this morning.  Remains comfortable.  No active issues.  Stable for discharge home today.  Discharge Exam: Vitals:   03/17/18 0428 03/17/18 0829  BP: (!) 168/73 134/60  Pulse: 62 66  Resp: 16 18  Temp: 98 F (36.7 C) 97.9 F (36.6 C)  SpO2: 99% 99%   Vitals:   03/16/18 1941 03/16/18 2348 03/17/18 0428 03/17/18 0829  BP: (!) 143/66 140/60 (!) 168/73 134/60  Pulse: 61 62 62 66  Resp:  18 16 18   Temp: 98.7 F (37.1 C) 98.4 F (36.9 C) 98 F (36.7 C) 97.9 F (36.6 C)  TempSrc: Oral Oral Oral Oral  SpO2: 96% 94% 99% 99%  Weight:      Height:        General: Pt is alert, awake, not in acute distress Cardiovascular: RRR, S1/S2 +, no rubs, no gallops Respiratory: CTA bilaterally, no wheezing, no rhonchi Abdominal: Soft, NT, ND, bowel sounds + Extremities: no edema, no cyanosis    The results of significant diagnostics from this hospitalization (including imaging, microbiology, ancillary and laboratory) are listed below for reference.     Microbiology: Recent Results (from the past 240 hour(s))   Urine Culture     Status: Abnormal   Collection Time: 03/15/18  8:52 PM  Result Value Ref Range Status   Specimen Description URINE, RANDOM  Final   Special Requests NONE  Final   Culture MULTIPLE SPECIES PRESENT, SUGGEST RECOLLECTION (A)  Final   Report Status 03/17/2018 FINAL  Final     Labs: BNP (last 3 results) No results for input(s): BNP in the last 8760 hours. Basic Metabolic Panel: Recent Labs  Lab 03/15/18 1436 03/15/18 1442  NA 137 136  K 4.0 3.9  CL 102 99  CO2 28  --   GLUCOSE 158* 152*  BUN 14 13  CREATININE 0.78 0.80  CALCIUM 10.2  --    Liver Function Tests: Recent Labs  Lab 03/15/18 1436  AST 21  ALT 12  ALKPHOS 61  BILITOT 0.8  PROT 6.9  ALBUMIN 3.8   No results for input(s): LIPASE, AMYLASE in the last 168 hours. No  results for input(s): AMMONIA in the last 168 hours. CBC: Recent Labs  Lab 03/15/18 1436 03/15/18 1442 03/17/18 0656  WBC 5.8  --  4.2  NEUTROABS 4.2  --   --   HGB 12.5 12.9 10.5*  HCT 38.6 38.0 33.8*  MCV 94.8  --  96.0  PLT 235  --  185   Cardiac Enzymes: Recent Labs  Lab 03/15/18 2019  TROPONINI <0.03   BNP: Invalid input(s): POCBNP CBG: No results for input(s): GLUCAP in the last 168 hours. D-Dimer No results for input(s): DDIMER in the last 72 hours. Hgb A1c Recent Labs    03/16/18 0332  HGBA1C 5.8*   Lipid Profile Recent Labs    03/16/18 0332  CHOL 182  HDL 40*  LDLCALC 125*  TRIG 85  CHOLHDL 4.6   Thyroid function studies No results for input(s): TSH, T4TOTAL, T3FREE, THYROIDAB in the last 72 hours.  Invalid input(s): FREET3 Anemia work up No results for input(s): VITAMINB12, FOLATE, FERRITIN, TIBC, IRON, RETICCTPCT in the last 72 hours. Urinalysis    Component Value Date/Time   COLORURINE YELLOW 03/15/2018 2052   APPEARANCEUR CLEAR 03/15/2018 2052   LABSPEC >1.046 (H) 03/15/2018 2052   PHURINE 7.0 03/15/2018 2052   GLUCOSEU NEGATIVE 03/15/2018 2052   HGBUR SMALL (A) 03/15/2018 2052    BILIRUBINUR NEGATIVE 03/15/2018 2052   KETONESUR NEGATIVE 03/15/2018 2052   PROTEINUR NEGATIVE 03/15/2018 2052   UROBILINOGEN 0.2 03/14/2009 1400   NITRITE POSITIVE (A) 03/15/2018 2052   LEUKOCYTESUR LARGE (A) 03/15/2018 2052   Sepsis Labs Invalid input(s): PROCALCITONIN,  WBC,  LACTICIDVEN Microbiology Recent Results (from the past 240 hour(s))  Urine Culture     Status: Abnormal   Collection Time: 03/15/18  8:52 PM  Result Value Ref Range Status   Specimen Description URINE, RANDOM  Final   Special Requests NONE  Final   Culture MULTIPLE SPECIES PRESENT, SUGGEST RECOLLECTION (A)  Final   Report Status 03/17/2018 FINAL  Final    Please note: You were cared for by a hospitalist during your hospital stay. Once you are discharged, your primary care physician will handle any further medical issues. Please note that NO REFILLS for any discharge medications will be authorized once you are discharged, as it is imperative that you return to your primary care physician (or establish a relationship with a primary care physician if you do not have one) for your post hospital discharge needs so that they can reassess your need for medications and monitor your lab values.    Time coordinating discharge: 40 minutes  SIGNED:   Shelly Coss, MD  [facility] 03/17/2018, 10:45 AM Pager [phone]  If 7PM-7AM, please contact night-coverage [email or web address] Password [email or web address]

## 2018-03-17 NOTE — Progress Notes (Signed)
Occupational Therapy Treatment Patient Details Name: Jeanette [name] MRN: [id number] DOB: 12-25-1934 Today's Date: 03/17/2018    History of present illness 83 y.o. female with medical history significant of prior CVA per patient when her Coumadin was held for wrist surgery, history of atrial fibrillation on chronic anticoagulation, hypertension, hyperlipidemia, prior history of breast cancer and anal cancer, peripheral vascular disease, history of renal artery stenosis presenting to the ED with a 3-day history of headache and pain behind both eyes with associated photophobia, phonophobia.  MRI revealed: Small, acute, nonhemorrhagic infarct of the left occipital lobe.   OT comments  Pt is eager to discharge home.  She is aware of visual deficit.  Reiterated need for ophthalmology follow up for field testing as well as recommendation for no driving.  She verbalized understanding   Follow Up Recommendations  Outpatient OT;Supervision/Assistance - 24 hour    Equipment Recommendations  Tub/shower seat    Recommendations for Other Services      Precautions / Restrictions Precautions Precautions: Fall       Mobility Bed Mobility Overal bed mobility: Independent                Transfers Overall transfer level: Needs assistance   Transfers: Sit to/from Stand;Stand Pivot Transfers Sit to Stand: Supervision Stand pivot transfers: Supervision            Balance Overall balance assessment: Needs assistance;Mild deficits observed, not formally tested   Sitting balance-Leahy Scale: Good     Standing balance support: No upper extremity supported;During functional activity Standing balance-Leahy Scale: Fair                             ADL either performed or assessed with clinical judgement   ADL                                         General ADL Comments: able to perform bathing and dressing with supervision      Vision   Additional  Comments: Discussed visual deficits with pt - recommendation for no driving until cleared as well as need to be evaluated by opthalmologist and have Humphrey field test performed.  She verbalized understanding of all.    Perception     Praxis      Cognition Arousal/Alertness: Awake/alert Behavior During Therapy: WFL for tasks assessed/performed Overall Cognitive Status: Impaired/Different from baseline                               Problem Solving: Slow processing          Exercises     Shoulder Instructions       General Comments Pt is eager to discharge home.  She reports she is still exhausted     Pertinent Vitals/ Pain       Pain Assessment: No/denies pain  Home Living                                          Prior Functioning/Environment              Frequency  Min 2X/week        Progress Toward Goals  OT Goals(current goals can  now be found in the care plan section)  Progress towards OT goals: Progressing toward goals     Plan Discharge plan remains appropriate    Co-evaluation                 AM-PAC PT "6 Clicks" Daily Activity     Outcome Measure   Help from another person eating meals?: None Help from another person taking care of personal grooming?: A Little Help from another person toileting, which includes using toliet, bedpan, or urinal?: A Little Help from another person bathing (including washing, rinsing, drying)?: A Little Help from another person to put on and taking off regular upper body clothing?: A Little Help from another person to put on and taking off regular lower body clothing?: A Little 6 Click Score: 19    End of Session    OT Visit Diagnosis: Unsteadiness on feet (R26.81)   Activity Tolerance Patient tolerated treatment well   Patient Left in chair;with call bell/phone within reach;with nursing/sitter in room   Nurse Communication          Time: 1210-1219 OT Time  Calculation (min): 9 min  Charges: OT General Charges $OT Visit: 1 Visit OT Treatments $Self Care/Home Management : 8-22 mins  Lucille Passy, OTR/L [place] Pager [phone] Office [phone]    Lucille Passy M 03/17/2018, 2:08 PM

## 2018-03-17 NOTE — Plan of Care (Signed)
  Problem: Education: Goal: Knowledge of General Education information will improve Description Including pain rating scale, medication(s)/side effects and non-pharmacologic comfort measures Outcome: Completed/Met   Problem: Health Behavior/Discharge Planning: Goal: Ability to manage health-related needs will improve Outcome: Completed/Met   Problem: Clinical Measurements: Goal: Ability to maintain clinical measurements within normal limits will improve Outcome: Completed/Met Goal: Will remain free from infection Outcome: Completed/Met Goal: Diagnostic test results will improve Outcome: Completed/Met Goal: Respiratory complications will improve Outcome: Completed/Met Goal: Cardiovascular complication will be avoided Outcome: Completed/Met   Problem: Activity: Goal: Risk for activity intolerance will decrease Outcome: Completed/Met   Problem: Nutrition: Goal: Adequate nutrition will be maintained Outcome: Completed/Met   Problem: Coping: Goal: Level of anxiety will decrease Outcome: Completed/Met   Problem: Elimination: Goal: Will not experience complications related to bowel motility Outcome: Completed/Met Goal: Will not experience complications related to urinary retention Outcome: Completed/Met   Problem: Pain Managment: Goal: General experience of comfort will improve Outcome: Completed/Met   Problem: Safety: Goal: Ability to remain free from injury will improve Outcome: Completed/Met   Problem: Education: Goal: Knowledge of disease or condition will improve Outcome: Completed/Met Goal: Knowledge of secondary prevention will improve Outcome: Completed/Met Goal: Individualized Educational Video(s) Outcome: Completed/Met   Problem: Health Behavior/Discharge Planning: Goal: Ability to manage health-related needs will improve Outcome: Completed/Met   Problem: Self-Care: Goal: Ability to participate in self-care as condition permits will improve Outcome:  Completed/Met   Problem: Ischemic Stroke/TIA Tissue Perfusion: Goal: Complications of ischemic stroke/TIA will be minimized Outcome: Completed/Met

## 2018-03-17 NOTE — Progress Notes (Signed)
PT Cancellation Note  Patient Details Name: Jeanette [name] MRN: [id number] DOB: Mar 19, 1935   Cancelled Treatment:    Reason Eval/Treat Not Completed: Other (comment); spoke with pt who endorses fatigue and needing to rest prior to d/c home today.  Spoke with her about follow up outpatient PT for balance and gait therapy.  Will defer pt due to planned d/c and fatigue.    Reginia Naas 03/17/2018, 12:23 PM Magda Kiel, [place] 03/17/2018

## 2018-03-17 NOTE — Care Management Note (Signed)
Case Management Note  Patient Details  Name: VIOLA KINNICK MRN: [id number] Date of Birth: 03/31/1935  Subjective/Objective:     Pt admitted with stroke. She is from home with spouse. Pt IADL.  PcP:  Dr Joylene Draft Insurance: [facility] medicare Pt denies any issues obtaining her medications and any issues with transportation.               Action/Plan: CM consulted for outpatient therapy. CM met with the patient and she would like to attend [facility]. Orders in epic and information on the AVS.  Pt has 24 hour supervision at home and transportation to home.   Expected Discharge Date:  03/17/18               Expected Discharge Plan:  OP Rehab  In-House Referral:     Discharge planning Services  CM Consult  Post Acute Care Choice:    Choice offered to:     DME Arranged:    DME Agency:     HH Arranged:    HH Agency:     Status of Service:  Completed, signed off  If discussed at [employer] of Stay Meetings, dates discussed:    Additional Comments:  Pollie Friar, RN 03/17/2018, 10:52 AM

## 2018-03-21 ENCOUNTER — Other Ambulatory Visit: Payer: Self-pay

## 2018-03-21 NOTE — Patient Outreach (Signed)
[facility]) Care Management  03/21/2018  Jeanette [name] 1935/03/02 [id number]   EMMI-Stroke RED ON EMMI ALERT Day # 3 Date: 03/20/18 Red Alert Reason:  Feeling worse overall? Yes New problems walking/talking/speaking/seeing? yes  Outreach attempt: spoke with patient.  She is able to verify HIPAA.  Addressed red alerts with patient.  Patient states yesterday she was not feeling her best but today she feels much better.  She states she has an appointment with her PCP on Monday and will be setting up neurologist appointment.  She states she had not heard from outpatient rehab.  Called outpatient rehab.  They do have the referral and patient will be called today. Relayed information to patient and she verbalized understanding.  Patient offers no further questions or concerns.     Plan: RN CM will contact patient again within the next 14 business days and patient agrees to next outreach.    Jone Baseman, RN, MSN [facility] Care Management Care Management Coordinator Direct Line [phone] Toll Free: [phone]  Fax: [phone]

## 2018-03-24 ENCOUNTER — Other Ambulatory Visit: Payer: Self-pay

## 2018-03-24 NOTE — Patient Outreach (Signed)
[place] [facility]) Care Management  03/24/2018  Jeanette [name] September 15, 1934 [id number]   EMMI- Stroke RED ON EMMI ALERT Day # 6 Date: 03/23/18 Red Alert Reason:  Questions/problems with meds? Yes  Smoked or been around smoke? Yes    Outreach attempt: Spoke with patient.  She is able to verify HIPAA.  Discussed red alert.  Patient states that she is does not have any questions now about her medications as she saw her PCP and he changed some of her medications. Patient states that no one around her smokes and neither does she.  Patient has no further questions or concerns.     Plan: RN CM will follow up with patient again within 14 business days and patient agrees to next outreach.    Jone Baseman, RN, MSN [facility] Care Management Care Management Coordinator Direct Line [phone] Toll Free: [phone]  Fax: [phone]

## 2018-04-07 ENCOUNTER — Other Ambulatory Visit: Payer: Self-pay

## 2018-04-07 NOTE — Patient Outreach (Signed)
[facility]) Care Management  04/07/2018  Jeanette [name] 10/23/1934 [id number]   EMMI- Stroke RED ON EMMI ALERT Day # 6 Date:03/23/18 Red Alert Reason: Questions/problems with meds? Yes  Smoked or been around smoke? Yes   Telephone call to patient for follow up.  No answer.  HIPAA compliant voice message left.   Plan: RN CM will attempt patient again within 4 business days.    Jone Baseman, RN, MSN [place] Management Care Management Coordinator Direct Line [phone] Cell [phone] Toll Free: [phone]  Fax: [phone]

## 2018-04-10 ENCOUNTER — Other Ambulatory Visit: Payer: Self-pay

## 2018-04-10 NOTE — Patient Outreach (Addendum)
[place] [facility]) Care Management  04/10/2018  Jeanette [name] 15-Dec-1934 [id number]   EMMI-Stroke RED ON EMMI ALERT Day #6 Date:03/23/18 Red Alert Reason: Questions/problems with meds? Yes  Smoked or been around smoke? Yes   Telephone call to patient for follow up.  No answer.  Unable to leave a voice message.  Plan: RN CM will attempt patient again within 4 business days.    Jone Baseman, RN, MSN [place] Management Care Management Coordinator Direct Line [phone] Cell [phone] Toll Free: [phone]  Fax: [phone]

## 2018-04-14 ENCOUNTER — Other Ambulatory Visit: Payer: Self-pay

## 2018-04-14 NOTE — Patient Outreach (Signed)
[place] [facility]) Care Management  04/14/2018  Jeanette [name] 1934-08-04 [id number]   EMMI-Stroke RED ON EMMI ALERT Day #6 Date:03/23/18 Red Alert Reason: Questions/problems with meds? Yes  Smoked or been around smoke? Yes   Telephone call to patient for follow up. No answer. Unable to leave a voice message.  Plan: RN CM will wait return call.  If no return call will close case.  Jone Baseman, RN, MSN [place] Management Care Management Coordinator Direct Line [phone] Cell [phone] Toll Free: [phone]  Fax: [phone]

## 2018-04-15 ENCOUNTER — Ambulatory Visit: Payer: Self-pay

## 2018-04-21 ENCOUNTER — Other Ambulatory Visit: Payer: Self-pay

## 2018-04-21 NOTE — Patient Outreach (Signed)
[place] [facility]) Care Management  04/21/2018  Jeanette [name] 1935-04-17 [id number]   Multiple attempts to establish contact with patient without success. No response from letter mailed to patient.   Plan: RN CM will close case at this time.   Jone Baseman, RN, MSN [place] Management Care Management Coordinator Direct Line [phone] Cell [phone] Toll Free: [phone]  Fax: [phone]

## 2018-04-23 ENCOUNTER — Encounter: Payer: Self-pay | Admitting: Adult Health

## 2018-04-23 ENCOUNTER — Ambulatory Visit: Payer: Medicare Other | Admitting: Adult Health

## 2018-04-23 VITALS — BP 135/79 | HR 57 | Ht 63.5 in | Wt 162.0 lb

## 2018-04-23 DIAGNOSIS — E785 Hyperlipidemia, unspecified: Secondary | ICD-10-CM | POA: Diagnosis not present

## 2018-04-23 DIAGNOSIS — I480 Paroxysmal atrial fibrillation: Secondary | ICD-10-CM

## 2018-04-23 DIAGNOSIS — I10 Essential (primary) hypertension: Secondary | ICD-10-CM | POA: Diagnosis not present

## 2018-04-23 DIAGNOSIS — I63432 Cerebral infarction due to embolism of left posterior cerebral artery: Secondary | ICD-10-CM | POA: Diagnosis not present

## 2018-04-23 DIAGNOSIS — G44201 Tension-type headache, unspecified, intractable: Secondary | ICD-10-CM

## 2018-04-23 MED ORDER — TOPIRAMATE 25 MG PO TABS: 25.0000 mg | ORAL_TABLET | Freq: Every day | ORAL | 3 refills | Status: DC

## 2018-04-23 NOTE — Patient Instructions (Signed)
Continue aspirin 325 mg daily and warfarin daily  and Repatha  for secondary stroke prevention  Continue to follow up with PCP regarding cholesterol and blood pressure management along with atrial fibrillation and warfarin management  Stop use of aspirin 325mg  once INR stable  Avoid use of estrogen cream due to interaction with warfarin  Headaches - start topamax 25mg  daily - if HA continues, we can increase to 2x per day  Continue to monitor blood pressure at home  Maintain strict control of hypertension with blood pressure goal below 130/90, diabetes with hemoglobin A1c goal below 6.5% and cholesterol with LDL cholesterol (bad cholesterol) goal below 70 mg/dL. I also advised the patient to eat a healthy diet with plenty of whole grains, cereals, fruits and vegetables, exercise regularly and maintain ideal body weight.  Followup in the future with me in 3 months or call earlier if needed       Thank you for coming to see [place] at [facility]. I hope we have been able to provide you high quality care today.  You may receive a patient satisfaction survey over the next few weeks. We would appreciate your feedback and comments so that we may continue to improve ourselves and the health of our patients.

## 2018-04-23 NOTE — Progress Notes (Signed)
[facility] [address] [place]. [place] [address] [phone]       OFFICE FOLLOW UP NOTE  Jeanette [name] Date of Birth:  21-Nov-1934 Medical Record Number:  [id number]   Reason for Referral:  hospital stroke follow up  CHIEF COMPLAINT:  Chief Complaint  Patient presents with  . Follow-up    Follow up for hospital Stroke saw Dr. Erlinda Hong in hospital pt in room 9 pt alone    HPI: Jeanette [name] is being seen today for initial visit in the office for left occipital lobe infarct secondary to AF on Coumadin with subtherapeutic INR on 03/15/2018. History obtained from patient and chart review. Reviewed all radiology images and labs personally.  Jeanette [name] is a 83 y.o. female with history of a previous stroke, renal artery stenosis, hypertension, hyperlipidemia, breast cancer, anxiety, and atrial fibrillation on coumadin (INR 1.22)  who presented with aphasia and right hemianopia. She did not receive IV t-PA due to late presentation.   CT had reviewed and showed remote left parietal lobe infarct.  MRI brain reviewed and showed small acute infarct of the left occipital lobe.  CTA head and neck showed severe left M1 and M2 stenosis with moderate right M1 stenosis.  2D echo showed an EF of 60-65%.  Due to subtherapeutic INR level at 1.22, it was recommended to continue warfarin daily along with additional aspirin 325 mg daily and can discontinue aspirin once INR goal 2-3.  She declined use of DOACs at that time during admission but per notes, she was willing to read about them and discuss with her PCP.  LDL 125 and recommended starting Lipitor 40 mg daily.  HTN stable during admission and recommended long-term goal normotensive range.  Patient was discharged home in stable condition with recommendations of outpatient OT.  Patient is being seen today for hospital follow-up.  She states overall she has been recovering well but does have mild visual loss describing it as  "splotchy" loss in her peripheral vision.  She also states that she was experiencing a headache 3 to 4 days prior to her stroke and has been experiencing daily headaches with a pain level 2/10 and describing as just a constant pressure located in her facial region and posterior portion of head.  She was seen by her PCP recently who prescribed her gabapentin but continued to have headaches.  She does follow-up with her PCP for Coumadin management and INR level checks with recent INR level at 2 and per patient, was recommended to continue on Coumadin and aspirin at that time.  She denies any side effects of continued Coumadin and aspirin such as bleeding or bruising.  She has also been started on Repatha as she has had a difficult time tolerating statin use in the past.  She does state that she started using estradiol cream in April and she is questioning whether this could have led to the subtherapeutic INR level.  She does state that she read the pamphlet that is provided by the pharmacy after she returned home from her stroke and read that it could cause strokes along with symptoms of speech difficulty and visual loss.  She has since stopped using this cream and does not plan on restarting due to the increased risk of stroke.  No further concerns at this time.  Denies new or worsening stroke/TIA symptoms.    ROS:   14 system review of systems performed and negative with exception of weight gain,  fatigue, blurred vision, diarrhea, urination problems, easy bruising, memory loss, headache, dizziness and decreased energy  PMH:  Past Medical History:  Diagnosis Date  . A-fib ([place]) 03/15/2018  . Anal cancer ([place])   . Anxiety   . Arthritis   . Breast cancer ([place])   . Breast cancer ([place])   . Cancer of breast ([place])   . Depression   . Dysrhythmia 2010   a-fib  . Hyperlipidemia   . Hypertension   . PVD (peripheral vascular disease) ([place])   . RAS (renal artery stenosis) ([place])   . Stroke [facility])    no  residual    PSH:  Past Surgical History:  Procedure Laterality Date  . BREAST BIOPSY Left 01/14/2003  . BREAST LUMPECTOMY Right 1989  . CARDIOVASCULAR STRESS TEST  07/27/2003   No evidence if LV myocardial ischemia or scar. LV EF 74%.  Marland Kitchen GYNECOLOGIC CRYOSURGERY    . OPEN REDUCTION INTERNAL FIXATION (ORIF) DISTAL RADIAL FRACTURE Left 02/19/2017   Procedure: OPEN REDUCTION INTERNAL FIXATION (ORIF) LEFT DISTAL RADIAL FRACTURE;  Surgeon: Leanora Cover, MD;  Location: [facility];  Service: Orthopedics;  Laterality: Left;  . OVARIAN CYST REMOVAL    . RENAL DOPPLER  09/09/2012   Right proximal renal artery: 60-99% diameter reduction. Normal patency of the left main renal artery.   . TRANSTHORACIC ECHOCARDIOGRAM  03/22/2006   EF 55-60%, mild mitral valvular regurg, pulmonary artery systolic pressure was moderately increased.  . TUBAL LIGATION      Social History:  Social History   Socioeconomic History  . Marital status: Married    Spouse name: Not on file  . Number of children: Not on file  . Years of education: Not on file  . Highest education level: Not on file  Occupational History  . Not on file  Social Needs  . Financial resource strain: Not on file  . Food insecurity:    Worry: Not on file    Inability: Not on file  . Transportation needs:    Medical: Not on file    Non-medical: Not on file  Tobacco Use  . Smoking status: Never Smoker  . Smokeless tobacco: Never Used  Substance and Sexual Activity  . Alcohol use: Yes    Alcohol/week: 2.0 standard drinks    Types: 2 Glasses of wine per week  . Drug use: No  . Sexual activity: Yes    Partners: Male    Birth control/protection: Post-menopausal  Lifestyle  . Physical activity:    Days per week: Not on file    Minutes per session: Not on file  . Stress: Not on file  Relationships  . Social connections:    Talks on phone: Not on file    Gets together: Not on file    Attends religious service: Not on file      Active member of club or organization: Not on file    Attends meetings of clubs or organizations: Not on file    Relationship status: Not on file  . Intimate partner violence:    Fear of current or ex partner: Not on file    Emotionally abused: Not on file    Physically abused: Not on file    Forced sexual activity: Not on file  Other Topics Concern  . Not on file  Social History Narrative  . Not on file    Family History:  Family History  Problem Relation Age of Onset  . Alzheimer's disease Mother   .  Pneumonia Mother   . Heart failure Father     Medications:   Current Outpatient Medications on File Prior to Visit  Medication Sig Dispense Refill  . aspirin 325 MG tablet Take 1 tablet (325 mg total) by mouth daily. 30 tablet 0  . cholecalciferol (VITAMIN D) 1000 units tablet Take 1,000 Units by mouth daily.    . Coenzyme Q10 (COQ10 PO) Take 1 capsule by mouth daily.    Marland Kitchen escitalopram (LEXAPRO) 10 MG tablet Take 10 mg by mouth daily.      Marland Kitchen gabapentin (NEURONTIN) 100 MG capsule TAKE ONE TO THREE CAPSULES UP TO THREE TIMES A DAY AS NEEDED FOR NERVE PAIN OR HEADACHE  6  . loratadine (CLARITIN) 10 MG tablet Take 10 mg by mouth daily as needed for allergies.    . metoprolol (TOPROL-XL) 200 MG 24 hr tablet Take 100 mg by mouth 2 (two) times daily.     Marland Kitchen olmesartan (BENICAR) 20 MG tablet Take 10 mg by mouth daily.     Marland Kitchen triamcinolone (NASACORT ALLERGY 24HR) 55 MCG/ACT AERO nasal inhaler Place 2 sprays into the nose as needed (sinus pressure).    . vitamin B-12 (CYANOCOBALAMIN) 100 MCG tablet Take 50 mcg by mouth daily.      Marland Kitchen warfarin (COUMADIN) 5 MG tablet Take 2.5-5 mg by mouth daily. Take 1 tablet on Monday and Friday. Take 1/2 tablet all other days     No current facility-administered medications on file prior to visit.     Allergies:   Allergies  Allergen Reactions  . Benazepril Hcl     cough  . Epinephrine     Shortness of breath and increased heart rate  .  Lisinopril     Cough  . Oxycodone Nausea And Vomiting    Slurred speech, weakness, nausea  . Vytorin [Ezetimibe-Simvastatin]      Physical Exam  Vitals:   04/23/18 1318  Weight: 162 lb (73.5 kg)  Height: 5' 3.5" (1.613 m)   Body mass index is 28.25 kg/m. No exam data present  General: well developed, well nourished, pleasant elderly Caucasian female, seated, in no evident distress Head: head normocephalic and atraumatic.   Neck: supple with no carotid or supraclavicular bruits Cardiovascular: regular rate and rhythm, no murmurs Musculoskeletal: no deformity Skin:  no rash/petichiae Vascular:  Normal pulses all extremities  Neurologic Exam Mental Status: Awake and fully alert. Oriented to place and time. Recent and remote memory intact. Attention span, concentration and fund of knowledge appropriate. Mood and affect appropriate.  Cranial Nerves: Fundoscopic exam reveals sharp disc margins. Pupils equal, briskly reactive to light. Extraocular movements full without nystagmus. Visual fields full to confrontation with subjective blurriness in all 4 quadrants. Hearing intact. Facial sensation intact. Face, tongue, palate moves normally and symmetrically.  Motor: Normal bulk and tone. Normal strength in all tested extremity muscles. Sensory.: intact to touch , pinprick , position and vibratory sensation.  Coordination: Rapid alternating movements normal in all extremities. Finger-to-nose and heel-to-shin performed accurately bilaterally. Gait and Station: Arises from chair without difficulty. Stance is normal. Gait demonstrates normal stride length and balance . Able to heel, toe and tandem walk without difficulty.  Reflexes: 1+ and symmetric. Toes downgoing.    NIHSS  1 Modified Rankin  1 HAS-BLED 2 CHA2DS2-VASc 6   Diagnostic Data (Labs, Imaging, Testing)  Ct Angio Head W Or Wo Contrast Ct Angio Neck W Or Wo Contrast 03/15/2018 IMPRESSION:  1. No emergent large vessel  occlusion.  2.  Progressive intracranial atherosclerosis with severe left M1 and M2 stenoses, moderate right M1 stenosis, and mild left P2 stenosis.  3. Widely patent cervical carotid arteries.  4. New moderate right vertebral artery origin stenosis.  5.  Aortic Atherosclerosis (ICD10-I70.0).   Dg Chest 2 View 03/15/2018 IMPRESSION:  No active cardiopulmonary disease.   Mr Brain Wo Contrast 03/16/2018 IMPRESSION:  Small, acute, nonhemorrhagic infarct of the left occipital lobe.   Ct Head Code Stroke Wo Contrast 03/15/2018 IMPRESSION:  1. Noacute intracranial abnormality or significant interval change.  2. Mild atrophy and white matter disease is stable.  3. Remote left parietal lobe infarct is stable.  4. ASPECTS is 10/10   ECHOCARDIOGRAM 03/16/2018 Study Conclusions - Left ventricle: The cavity size was normal. There was mild   concentric hypertrophy. Systolic function was normal. The   estimated ejection fraction was in the range of 60% to 65%. Wall   motion was normal; there were no regional wall motion   abnormalities. Features are consistent with a pseudonormal left   ventricular filling pattern, with concomitant abnormal relaxation   and increased filling pressure (grade 2 diastolic dysfunction). - Aortic valve: Transvalvular velocity was within the normal range.   There was no stenosis. There was no regurgitation. - Mitral valve: Transvalvular velocity was within the normal range.   There was no evidence for stenosis. There was trivial   regurgitation. - Left atrium: The atrium was mildly dilated. - Right ventricle: The cavity size was normal. Wall thickness was   normal. Systolic function was normal. - Atrial septum: No defect or patent foramen ovale was identified   by color flow Doppler. - Tricuspid valve: There was trivial regurgitation. - Pulmonary arteries: Systolic pressure was within the normal   range. PA peak pressure: 25 mm Hg (S).   ASSESSMENT: Jeanette [name] is a 83 y.o. year old female here with left occipital infarcts on 03/15/2018 secondary to PAF on Coumadin with subtherapeutic INR. Vascular risk factors include prior infarcts, HTN, HLD and PAF.  Patient is being seen today for hospital follow-up and does continue to have some decrease in visual acuity but otherwise has been stable.  Patient was on estradiol cream prior to her stroke and is questioning whether this could have caused her subtherapeutic INR level.    PLAN: -Continue aspirin 325 mg daily and warfarin daily  and Repatha for secondary stroke prevention -F/u with PCP regarding your HLD and HTN management -per Up-To-Date, estradiol cream can increase risk of stroke along with diminishing effects of anticoagulation therapy therefore was highly recommended to not restart use of estradiol cream.  Did advise patient that it is difficult to tell whether this was the exact cause of her subtherapeutic INR but as there is a chance that her INR level could be lowered from use of this cream, not recommended to restart.  Patient verbalized understanding -Due to continued daily headaches recommended to start Topamax 25 mg daily and advised patient that after 1 to 2 weeks, if headaches continue we can consider increasing frequency to 25 mg twice daily. -continue to monitor BP at home -advised to continue to stay active and maintain a healthy diet -Maintain strict control of hypertension with blood pressure goal below 130/90, diabetes with hemoglobin A1c goal below 6.5% and cholesterol with LDL cholesterol (bad cholesterol) goal below 70 mg/dL. I also advised the patient to eat a healthy diet with plenty of whole grains, cereals, fruits and vegetables, exercise regularly and maintain ideal body weight.  Follow up in 3 months or [name] earlier if needed   Greater than 50% of time during this 25 minute visit was spent on counseling,explanation of diagnosis of left occipital infarct, reviewing risk  factor management of PAF, HTN and HLD, planning of further management, discussion with patient and family and coordination of care    Venancio Poisson, AGNP-BC  [facility] [address] [place], [place] [address]  Phone [phone] Fax [phone] Note: This document was prepared with digital dictation and possible smart phrase technology. Any transcriptional errors that result from this process are unintentional.

## 2018-04-24 NOTE — Progress Notes (Signed)
I agree with the above plan 

## 2018-05-07 ENCOUNTER — Telehealth: Payer: Self-pay | Admitting: Adult Health

## 2018-05-07 NOTE — Telephone Encounter (Signed)
RN call patient that her primary Dr. Joylene Draft manage her gabapentin. She will need to call him about that medication and refills. Rn stated Janett Billow NP prescribed topamax for her headaches only. Pt verbalized understanding, and was told to call PCP for gabapentin.

## 2018-05-07 NOTE — Telephone Encounter (Signed)
Pt came in having questions about her Gabapentin, pt is stating that it is not helping her and she has also run out of medication. Please call pt to advise. Thank you.

## 2018-05-07 NOTE — Telephone Encounter (Signed)
PCP had prescribed gabapentin. Please advise her to f/u with PCP regarding medication.

## 2018-07-16 ENCOUNTER — Other Ambulatory Visit: Payer: Self-pay

## 2018-07-16 ENCOUNTER — Other Ambulatory Visit: Payer: Self-pay | Admitting: Adult Health

## 2018-07-16 NOTE — Telephone Encounter (Signed)
Refill on topamax.

## 2018-07-24 ENCOUNTER — Ambulatory Visit: Payer: Medicare Other | Admitting: Adult Health

## 2018-07-24 NOTE — Progress Notes (Deleted)
[facility] [address] [place]. [place] [address] [phone]       OFFICE FOLLOW UP NOTE  [name] Date of Birth:  01/12/1935 Medical Record Number:  [id number]   Reason for Referral:  hospital stroke follow up  CHIEF COMPLAINT:  No chief complaint on file.   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  Patient has been stable from a stroke standpoint  ***. At prior visit, patient continued to complain of headaches and recommended to initiate Topamax.  Headaches have been ***.  She continues on Coumadin and aspirin without side effects of bleeding or bruising with INR levels ***.  She continues to be followed by her PCP for management.  She continues on Repatha for HLD management without reported side effects.  No further concerns at this time.  Denies new or worsening stroke/TIA symptoms.  04/23/2018 visit: Patient is being seen today for hospital follow-up.  She states overall she has been recovering well but does have mild visual loss describing it as "splotchy" loss in her peripheral vision.  She also states that she was experiencing a headache 3 to 4 days prior to her stroke and has been experiencing daily headaches with a pain level 2/10 and describing as just a constant pressure located in her facial region and posterior portion of head.  She was seen by her PCP recently who prescribed her gabapentin but continued to have headaches.  She does follow-up with her PCP for Coumadin management and INR level checks with recent INR level at 2 and per patient, was recommended to continue on Coumadin and aspirin at that time.  She denies any side effects of continued Coumadin and aspirin such as bleeding or bruising.  She has also been started on Repatha as she has had a difficult time tolerating statin use in the past.  She does state that she started using estradiol cream in  April and she is questioning whether this could have led to the subtherapeutic INR level.  She does state that she read the pamphlet that is provided by the pharmacy after she returned home from her stroke and read that it could cause strokes along with symptoms of speech difficulty and visual loss.  She has since stopped using this cream and does not plan on restarting due to the increased risk of stroke.  No further concerns at this time.  Denies new or worsening stroke/TIA symptoms.   Hospital admission 03/15/2018: Jeanette [name] is a 84 y.o. female with history of a previous stroke, renal artery stenosis, hypertension, hyperlipidemia, breast cancer, anxiety, and atrial fibrillation on coumadin (INR 1.22)  who presented with aphasia and right hemianopia. She did not receive IV t-PA due to late presentation.   CT had reviewed and showed remote left parietal lobe infarct.  MRI brain reviewed and showed small acute infarct of the left occipital lobe.  CTA head and neck showed severe left M1 and M2 stenosis with moderate right M1 stenosis.  2D echo showed an EF of 60-65%.  Due to subtherapeutic INR level at 1.22, it was recommended to continue warfarin daily along with additional aspirin 325 mg daily and can discontinue aspirin once INR goal 2-3.  She declined use of DOACs at that time during admission but per notes, she was willing to read about them and discuss with her PCP.  LDL 125 and recommended starting Lipitor 40  mg daily.  HTN stable during admission and recommended long-term goal normotensive range.  Patient was discharged home in stable condition with recommendations of outpatient OT.      ROS:   14 system review of systems performed and negative with exception of weight gain, fatigue, blurred vision, diarrhea, urination problems, easy bruising, memory loss, headache, dizziness and decreased energy  PMH:  Past Medical History:  Diagnosis Date  . A-fib ([place]) 03/15/2018  . Anal cancer ([place])    . Anxiety   . Arthritis   . Breast cancer ([place])   . Breast cancer ([place])   . Cancer of breast ([place])   . Depression   . Dysrhythmia 2010   a-fib  . Hyperlipidemia   . Hypertension   . PVD (peripheral vascular disease) ([place])   . RAS (renal artery stenosis) ([place])   . Stroke [facility])    no residual    PSH:  Past Surgical History:  Procedure Laterality Date  . BREAST BIOPSY Left 01/14/2003  . BREAST LUMPECTOMY Right 1989  . CARDIOVASCULAR STRESS TEST  07/27/2003   No evidence if LV myocardial ischemia or scar. LV EF 74%.  Marland Kitchen GYNECOLOGIC CRYOSURGERY    . OPEN REDUCTION INTERNAL FIXATION (ORIF) DISTAL RADIAL FRACTURE Left 02/19/2017   Procedure: OPEN REDUCTION INTERNAL FIXATION (ORIF) LEFT DISTAL RADIAL FRACTURE;  Surgeon: Leanora Cover, MD;  Location: [place];  Service: Orthopedics;  Laterality: Left;  . OVARIAN CYST REMOVAL    . RENAL DOPPLER  09/09/2012   Right proximal renal artery: 60-99% diameter reduction. Normal patency of the left main renal artery.   . TRANSTHORACIC ECHOCARDIOGRAM  03/22/2006   EF 55-60%, mild mitral valvular regurg, pulmonary artery systolic pressure was moderately increased.  . TUBAL LIGATION      Social History:  Social History   Socioeconomic History  . Marital status: Married    Spouse name: Not on file  . Number of children: Not on file  . Years of education: Not on file  . Highest education level: Not on file  Occupational History  . Not on file  Social Needs  . Financial resource strain: Not on file  . Food insecurity:    Worry: Not on file    Inability: Not on file  . Transportation needs:    Medical: Not on file    Non-medical: Not on file  Tobacco Use  . Smoking status: Never Smoker  . Smokeless tobacco: Never Used  Substance and Sexual Activity  . Alcohol use: Yes    Alcohol/week: 2.0 standard drinks    Types: 2 Glasses of wine per week  . Drug use: No  . Sexual activity: Yes    Partners: Male    Birth  control/protection: Post-menopausal  Lifestyle  . Physical activity:    Days per week: Not on file    Minutes per session: Not on file  . Stress: Not on file  Relationships  . Social connections:    Talks on phone: Not on file    Gets together: Not on file    Attends religious service: Not on file    Active member of club or organization: Not on file    Attends meetings of clubs or organizations: Not on file    Relationship status: Not on file  . Intimate partner violence:    Fear of current or ex partner: Not on file    Emotionally abused: Not on file    Physically abused: Not on file  Forced sexual activity: Not on file  Other Topics Concern  . Not on file  Social History Narrative  . Not on file    Family History:  Family History  Problem Relation Age of Onset  . Alzheimer's disease Mother   . Pneumonia Mother   . Heart failure Father     Medications:   Current Outpatient Medications on File Prior to Visit  Medication Sig Dispense Refill  . aspirin 325 MG tablet Take 1 tablet (325 mg total) by mouth daily. 30 tablet 0  . cholecalciferol (VITAMIN D) 1000 units tablet Take 1,000 Units by mouth daily.    . Coenzyme Q10 (COQ10 PO) Take 1 capsule by mouth daily.    Marland Kitchen escitalopram (LEXAPRO) 10 MG tablet Take 10 mg by mouth daily.      . Evolocumab 140 MG/ML SOAJ Inject 517 applicators into the skin every 30 (thirty) days.    Marland Kitchen gabapentin (NEURONTIN) 100 MG capsule TAKE ONE TO THREE CAPSULES UP TO THREE TIMES A DAY AS NEEDED FOR NERVE PAIN OR HEADACHE  6  . loratadine (CLARITIN) 10 MG tablet Take 10 mg by mouth daily as needed for allergies.    . metoprolol (TOPROL-XL) 200 MG 24 hr tablet Take 100 mg by mouth 2 (two) times daily.     Marland Kitchen olmesartan (BENICAR) 20 MG tablet Take 10 mg by mouth daily.     Marland Kitchen topiramate (TOPAMAX) 25 MG tablet TAKE 1 TABLET BY MOUTH EVERY DAY 90 tablet 0  . triamcinolone (NASACORT ALLERGY 24HR) 55 MCG/ACT AERO nasal inhaler Place 2 sprays into the  nose as needed (sinus pressure).    . vitamin B-12 (CYANOCOBALAMIN) 100 MCG tablet Take 50 mcg by mouth daily.      Marland Kitchen warfarin (COUMADIN) 5 MG tablet Take 2.5-5 mg by mouth daily. Take 1 tablet on Monday and Friday. Take 1/2 tablet all other days     No current facility-administered medications on file prior to visit.     Allergies:   Allergies  Allergen Reactions  . Benazepril Hcl     cough  . Epinephrine     Shortness of breath and increased heart rate  . Lisinopril     Cough  . Oxycodone Nausea And Vomiting    Slurred speech, weakness, nausea  . Vytorin [Ezetimibe-Simvastatin]      Physical Exam  There were no vitals filed for this visit. There is no height or weight on file to calculate BMI. No exam data present  General: well developed, well nourished, pleasant elderly Caucasian female, seated, in no evident distress Head: head normocephalic and atraumatic.   Neck: supple with no carotid or supraclavicular bruits Cardiovascular: regular rate and rhythm, no murmurs Musculoskeletal: no deformity Skin:  no rash/petichiae Vascular:  Normal pulses all extremities  Neurologic Exam Mental Status: Awake and fully alert. Oriented to place and time. Recent and remote memory intact. Attention span, concentration and fund of knowledge appropriate. Mood and affect appropriate.  Cranial Nerves: Fundoscopic exam reveals sharp disc margins. Pupils equal, briskly reactive to light. Extraocular movements full without nystagmus. Visual fields full to confrontation with subjective blurriness in all 4 quadrants. Hearing intact. Facial sensation intact. Face, tongue, palate moves normally and symmetrically.  Motor: Normal bulk and tone. Normal strength in all tested extremity muscles. Sensory.: intact to touch , pinprick , position and vibratory sensation.  Coordination: Rapid alternating movements normal in all extremities. Finger-to-nose and heel-to-shin performed accurately  bilaterally. Gait and Station: Arises from chair without  difficulty. Stance is normal. Gait demonstrates normal stride length and balance . Able to heel, toe and tandem walk without difficulty.  Reflexes: 1+ and symmetric. Toes downgoing.    NIHSS  1 Modified Rankin  1 HAS-BLED 2 CHA2DS2-VASc 6   Diagnostic Data (Labs, Imaging, Testing)  Ct Angio Head W Or Wo Contrast Ct Angio Neck W Or Wo Contrast 03/15/2018 IMPRESSION:  1. No emergent large vessel occlusion.  2. Progressive intracranial atherosclerosis with severe left M1 and M2 stenoses, moderate right M1 stenosis, and mild left P2 stenosis.  3. Widely patent cervical carotid arteries.  4. New moderate right vertebral artery origin stenosis.  5.  Aortic Atherosclerosis (ICD10-I70.0).   Dg Chest 2 View 03/15/2018 IMPRESSION:  No active cardiopulmonary disease.   Mr Brain Wo Contrast 03/16/2018 IMPRESSION:  Small, acute, nonhemorrhagic infarct of the left occipital lobe.   Ct Head Code Stroke Wo Contrast 03/15/2018 IMPRESSION:  1. Noacute intracranial abnormality or significant interval change.  2. Mild atrophy and white matter disease is stable.  3. Remote left parietal lobe infarct is stable.  4. ASPECTS is 10/10   ECHOCARDIOGRAM 03/16/2018 Study Conclusions - Left ventricle: The cavity size was normal. There was mild   concentric hypertrophy. Systolic function was normal. The   estimated ejection fraction was in the range of 60% to 65%. Wall   motion was normal; there were no regional wall motion   abnormalities. Features are consistent with a pseudonormal left   ventricular filling pattern, with concomitant abnormal relaxation   and increased filling pressure (grade 2 diastolic dysfunction). - Aortic valve: Transvalvular velocity was within the normal range.   There was no stenosis. There was no regurgitation. - Mitral valve: Transvalvular velocity was within the normal range.   There was no evidence for stenosis.  There was trivial   regurgitation. - Left atrium: The atrium was mildly dilated. - Right ventricle: The cavity size was normal. Wall thickness was   normal. Systolic function was normal. - Atrial septum: No defect or patent foramen ovale was identified   by color flow Doppler. - Tricuspid valve: There was trivial regurgitation. - Pulmonary arteries: Systolic pressure was within the normal   range. PA peak pressure: 25 mm Hg (S).   ASSESSMENT: Jeanette [name] is a 84 y.o. year old female here with left occipital infarcts on 03/15/2018 secondary to PAF on Coumadin with subtherapeutic INR. Vascular risk factors include prior infarcts, HTN, HLD and PAF.  Patient is being seen today for hospital follow-up and does continue to have some decrease in visual acuity but otherwise has been stable.  Patient was on estradiol cream prior to her stroke and is questioning whether this could have caused her subtherapeutic INR level.    PLAN: -Continue aspirin 325 mg daily and warfarin daily  and Repatha for secondary stroke prevention -F/u with PCP regarding your HLD and HTN management -per Up-To-Date, estradiol cream can increase risk of stroke along with diminishing effects of anticoagulation therapy therefore was highly recommended to not restart use of estradiol cream.  Did advise patient that it is difficult to tell whether this was the exact cause of her subtherapeutic INR but as there is a chance that her INR level could be lowered from use of this cream, not recommended to restart.  Patient verbalized understanding -Due to continued daily headaches recommended to start Topamax 25 mg daily and advised patient that after 1 to 2 weeks, if headaches continue we can consider increasing frequency to 25  mg twice daily. -continue to monitor BP at home -advised to continue to stay active and maintain a healthy diet -Maintain strict control of hypertension with blood pressure goal below 130/90, diabetes with  hemoglobin A1c goal below 6.5% and cholesterol with LDL cholesterol (bad cholesterol) goal below 70 mg/dL. I also advised the patient to eat a healthy diet with plenty of whole grains, cereals, fruits and vegetables, exercise regularly and maintain ideal body weight.  Follow up in 3 months or call earlier if needed   Greater than 50% of time during this 25 minute visit was spent on counseling,explanation of diagnosis of left occipital infarct, reviewing risk factor management of PAF, HTN and HLD, planning of further management, discussion with patient and family and coordination of care    Venancio Poisson, AGNP-BC  [facility] [address] [place], [place] [address]  Phone [phone] Fax [phone] Note: This document was prepared with digital dictation and possible smart phrase technology. Any transcriptional errors that result from this process are unintentional.

## 2018-07-28 ENCOUNTER — Encounter: Payer: Self-pay | Admitting: Adult Health

## 2018-08-01 ENCOUNTER — Other Ambulatory Visit: Payer: Self-pay | Admitting: Internal Medicine

## 2018-08-01 DIAGNOSIS — E079 Disorder of thyroid, unspecified: Secondary | ICD-10-CM

## 2018-08-01 DIAGNOSIS — R7989 Other specified abnormal findings of blood chemistry: Secondary | ICD-10-CM

## 2018-08-14 ENCOUNTER — Ambulatory Visit
Admission: RE | Admit: 2018-08-14 | Discharge: 2018-08-14 | Disposition: A | Discharge status: Home / Self Care | Payer: Medicare Other | Source: Ambulatory Visit | Attending: Internal Medicine | Admitting: Internal Medicine

## 2018-08-14 DIAGNOSIS — E079 Disorder of thyroid, unspecified: Secondary | ICD-10-CM

## 2018-08-14 DIAGNOSIS — R7989 Other specified abnormal findings of blood chemistry: Secondary | ICD-10-CM

## 2018-09-09 ENCOUNTER — Emergency Department (HOSPITAL_COMMUNITY)
Admission: EM | Admit: 2018-09-09 | Discharge: 2018-09-09 | Disposition: A | Discharge status: Home / Self Care | Payer: Medicare Other | Attending: Emergency Medicine | Admitting: Emergency Medicine

## 2018-09-09 ENCOUNTER — Emergency Department (HOSPITAL_COMMUNITY): Payer: Medicare Other

## 2018-09-09 ENCOUNTER — Encounter (HOSPITAL_COMMUNITY): Payer: Self-pay

## 2018-09-09 ENCOUNTER — Other Ambulatory Visit: Payer: Self-pay

## 2018-09-09 DIAGNOSIS — Z8673 Personal history of transient ischemic attack (TIA), and cerebral infarction without residual deficits: Secondary | ICD-10-CM | POA: Diagnosis not present

## 2018-09-09 DIAGNOSIS — E785 Hyperlipidemia, unspecified: Secondary | ICD-10-CM | POA: Insufficient documentation

## 2018-09-09 DIAGNOSIS — Z7901 Long term (current) use of anticoagulants: Secondary | ICD-10-CM | POA: Diagnosis not present

## 2018-09-09 DIAGNOSIS — R51 Headache: Secondary | ICD-10-CM | POA: Insufficient documentation

## 2018-09-09 DIAGNOSIS — Z7982 Long term (current) use of aspirin: Secondary | ICD-10-CM | POA: Insufficient documentation

## 2018-09-09 DIAGNOSIS — I4891 Unspecified atrial fibrillation: Secondary | ICD-10-CM | POA: Diagnosis not present

## 2018-09-09 DIAGNOSIS — Z853 Personal history of malignant neoplasm of breast: Secondary | ICD-10-CM | POA: Insufficient documentation

## 2018-09-09 DIAGNOSIS — Z79899 Other long term (current) drug therapy: Secondary | ICD-10-CM | POA: Diagnosis not present

## 2018-09-09 DIAGNOSIS — R519 Headache, unspecified: Secondary | ICD-10-CM

## 2018-09-09 DIAGNOSIS — I10 Essential (primary) hypertension: Secondary | ICD-10-CM | POA: Diagnosis not present

## 2018-09-09 DIAGNOSIS — R0789 Other chest pain: Secondary | ICD-10-CM | POA: Diagnosis not present

## 2018-09-09 LAB — CBC WITH DIFFERENTIAL/PLATELET
ABS IMMATURE GRANULOCYTES: 0.05 10*3/uL (ref 0.00–0.07)
BASOS ABS: 0 10*3/uL (ref 0.0–0.1)
Basophils Relative: 0 %
EOS ABS: 0 10*3/uL (ref 0.0–0.5)
Eosinophils Relative: 0 %
HEMATOCRIT: 37.1 % (ref 36.0–46.0)
Hemoglobin: 11.5 g/dL — ABNORMAL LOW (ref 12.0–15.0)
IMMATURE GRANULOCYTES: 1 %
LYMPHS ABS: 1 10*3/uL (ref 0.7–4.0)
LYMPHS PCT: 17 %
MCH: 29.3 pg (ref 26.0–34.0)
MCHC: 31 g/dL (ref 30.0–36.0)
MCV: 94.6 fL (ref 80.0–100.0)
MONOS PCT: 12 %
Monocytes Absolute: 0.7 10*3/uL (ref 0.1–1.0)
NEUTROS ABS: 4.2 10*3/uL (ref 1.7–7.7)
NEUTROS PCT: 70 %
NRBC: 0 % (ref 0.0–0.2)
Platelets: 200 10*3/uL (ref 150–400)
RBC: 3.92 MIL/uL (ref 3.87–5.11)
RDW: 14.9 % (ref 11.5–15.5)
WBC: 5.9 10*3/uL (ref 4.0–10.5)

## 2018-09-09 LAB — COMPREHENSIVE METABOLIC PANEL
ALBUMIN: 3.6 g/dL (ref 3.5–5.0)
ALT: 11 U/L (ref 0–44)
ANION GAP: 7 (ref 5–15)
AST: 21 U/L (ref 15–41)
Alkaline Phosphatase: 46 U/L (ref 38–126)
BUN: 12 mg/dL (ref 8–23)
CHLORIDE: 107 mmol/L (ref 98–111)
CO2: 24 mmol/L (ref 22–32)
Calcium: 10 mg/dL (ref 8.9–10.3)
Creatinine, Ser: 0.89 mg/dL (ref 0.44–1.00)
GFR calc non Af Amer: >60 mL/min (ref >60)
GLUCOSE: 107 mg/dL — AB (ref 70–99)
POTASSIUM: 3.9 mmol/L (ref 3.5–5.1)
SODIUM: 138 mmol/L (ref 135–145)
Total Bilirubin: 0.5 mg/dL (ref 0.3–1.2)
Total Protein: 6.3 g/dL — ABNORMAL LOW (ref 6.5–8.1)

## 2018-09-09 LAB — I-STAT TROPONIN, ED
TROPONIN I, POC: 0 ng/mL (ref 0.00–0.08)
Troponin i, poc: 0 ng/mL (ref 0.00–0.08)

## 2018-09-09 LAB — PROTIME-INR
INR: 2.1 — AB (ref 0.8–1.2)
Prothrombin Time: 23.5 seconds — ABNORMAL HIGH (ref 11.4–15.2)

## 2018-09-09 MED ORDER — FAMOTIDINE 20 MG PO TABS
20.0000 mg | ORAL_TABLET | Freq: Once | ORAL | Status: AC
  Administered 2018-09-09: 20 mg via ORAL
  Filled 2018-09-09: qty 1

## 2018-09-09 MED ORDER — FAMOTIDINE IN NACL 20-0.9 MG/50ML-% IV SOLN
20.0000 mg | Freq: Once | INTRAVENOUS | Status: DC
  Filled 2018-09-09: qty 50

## 2018-09-09 MED ORDER — OMEPRAZOLE 20 MG PO CPDR: 20.0000 mg | DELAYED_RELEASE_CAPSULE | Freq: Every day | ORAL | 0 refills | Status: DC

## 2018-09-09 MED ORDER — METOCLOPRAMIDE HCL 5 MG/ML IJ SOLN
10.0000 mg | Freq: Once | INTRAMUSCULAR | Status: AC
  Administered 2018-09-09: 10 mg via INTRAVENOUS
  Filled 2018-09-09: qty 2

## 2018-09-09 NOTE — Discharge Instructions (Addendum)
You have been seen today for a headache and chest pain. Please read and follow all provided instructions.   1. Medications: omeprazole, usual home medications 2. Treatment: rest, drink plenty of fluids 3. Follow Up: Please follow up with your primary doctor in 2 days for discussion of your diagnoses and further evaluation after today's visit; if you do not have a primary care doctor use the resource guide provided to find one; Please return to the ER for any new or worsening symptoms. Please obtain all of your results from medical records or have your doctors office obtain the results - share them with your doctor - you should be seen at your doctors office. Call today to arrange your follow up.   Take medications as prescribed. Please review all of the medicines and only take them if you do not have an allergy to them. Return to the emergency room for worsening condition or new concerning symptoms. Follow up with your regular doctor. If you don't have a regular doctor use one of the numbers below to establish a primary care doctor.  Please be aware that if you are taking birth control pills, taking other prescriptions, ESPECIALLY ANTIBIOTICS may make the birth control ineffective - if this is the case, either do not engage in sexual activity or use alternative methods of birth control such as condoms until you have finished the medicine and your family doctor says it is OK to restart them. If you are on a blood thinner such as COUMADIN, be aware that any other medicine that you take may cause the coumadin to either work too much, or not enough - you should have your coumadin level rechecked in next 7 days if this is the case.  ?  It is also a possibility that you have an allergic reaction to any of the medicines that you have been prescribed - Everybody reacts differently to medications and while MOST people have no trouble with most medicines, you may have a reaction such as nausea, vomiting, rash,  swelling, shortness of breath. If this is the case, please stop taking the medicine immediately and contact your physician.  ?  You should return to the ER if you develop severe or worsening symptoms.   Emergency Department Resource Guide 1) Find a Doctor and Pay Out of Pocket Although you won't have to find out who is covered by your insurance plan, it is a good idea to ask around and get recommendations. You will then need to call the office and see if the doctor you have chosen will accept you as a new patient and what types of options they offer for patients who are self-pay. Some doctors offer discounts or will set up payment plans for their patients who do not have insurance, but you will need to ask so you aren't surprised when you get to your appointment.  2) Contact Your Local Health Department Not all health departments have doctors that can see patients for sick visits, but many do, so it is worth a call to see if yours does. If you don't know where your local health department is, you can check in your phone book. The CDC also has a tool to help you locate your state's health department, and many state websites also have listings of all of their local health departments.  3) Find a [facility] If your illness is not likely to be very severe or complicated, you may want to try a walk in clinic. These are  popping up all over the country in pharmacies, drugstores, and shopping centers. They're usually staffed by nurse practitioners or physician assistants that have been trained to treat common illnesses and complaints. They're usually fairly quick and inexpensive. However, if you have serious medical issues or chronic medical problems, these are probably not your best option.  No Primary Care Doctor: Call Health Connect at  [phone] - they can help you locate a primary care doctor that  accepts your insurance, provides certain services, etc. Physician Referral [phone]  Emergency Department Resource Guide 1) Find a Doctor and Pay Out of Pocket Although you won't have to find out who is covered by your insurance plan, it is a good idea to ask around and get recommendations. You will then need to call the office and see if the doctor you have chosen will accept you as a new patient and what types of options they offer for patients who are self-pay. Some doctors offer discounts or will set up payment plans for their patients who do not have insurance, but you will need to ask so you aren't surprised when you get to your appointment.  2) Contact Your Local Health Department Not all health departments have doctors that can see patients for sick visits, but many do, so it is worth a call to see if yours does. If you don't know where your local health department is, you can check in your phone book. The CDC also has a tool to help you locate your state's health department, and many state websites also have listings of all of their local health departments.  3) Find a [facility] If your illness is not likely to be very severe or complicated, you may want to try a walk in clinic. These are popping up all over the country in pharmacies, drugstores, and shopping centers. They're usually staffed by nurse practitioners or physician assistants that have been trained to treat common illnesses and complaints. They're usually fairly quick and inexpensive. However, if you have serious medical issues or chronic medical problems, these are probably not your best option.  No Primary Care Doctor: Call Health Connect at  [phone] - they can help you locate a primary care doctor that  accepts your insurance, provides certain services, etc. Physician Referral Service- [phone]  Chronic Pain Problems: Organization         Address  Phone   Notes  [facility]  [phone] Patients need to be referred by their primary care doctor.    Medication Assistance: Organization         Address  Phone   Notes  [facility] Medication [facility] [address] [place], [place] [address] [phone] --Must be a resident of [facility] -- Must have NO insurance coverage whatsoever (no Medicaid/ Medicare, etc.) -- The pt. MUST have a primary care doctor that directs their care regularly and follows them in the community   [facility]  [phone]   [employer]  [phone]    Agencies that provide inexpensive medical care: Organization         Address  Phone   Notes  [place]  [phone]   [facility]    [phone]   [facility] [place], [place] [address] [phone]   [place] [address] [place] [phone]   [facility]    [phone])  [facility]    [phone]   [place] [address], [place] Phone:  [phone], Fax:  [phone] Hours of Operation:  9 am - 6 pm, M-F.  Also accepts Medicaid/Medicare and self-pay.  [facility] [place] [address], [address], [place] Phone: [phone], Fax: [phone]. Hours of Operation:  8:30 am - 5:30 pm, M-F.  Also accepts Medicaid and self-pay.  [facility] [address], [place] Phone: [phone]   [place], [place], [place] [phone], Ext. [phone] Mondays & Thursdays: 7-9 AM.  First 15 patients are seen on a first come, first serve basis.    [place] [facility]:  Organization         Address  Phone   Notes  [facility] [address], [place] [phone] Also accepts self-pay patients.  [facility] [address], [place]  [phone]   [place], [address], [place] [phone]   [facility] [address], [place] [phone]   Lucianne Lei [address], [place]   [phone] Only accepts [place] [facility] [place] patients after they have their name applied to their card.   Self-Pay (no insurance) in [facility]:  Organization         Address  Phone   Notes  Sickle Cell Patients, [facility] [place] [phone]   [facility] [place] [phone]   [facility] [place]  [place], [place], [place] [phone]   [facility]/Dr. Osei-Bonsu  [address] [place] or [address], [place] [phone] Phone number for both [place] and [place] locations is the same.  [facility] [address] [place] [phone]   [facility] [address], [place] or [address] [phone]   [facility] [address], [place] [phone], phone; [phone], fax Sees patients 1st and 3rd Saturday of every month.  Must not qualify for public or private insurance (i.e. Medicaid, Medicare, [place] Health Choice, Veterans' Benefits)  Household income should be no more than 200% of the poverty level The clinic cannot treat you if you are pregnant or think you are pregnant  Sexually transmitted diseases are not treated at the clinic.

## 2018-09-09 NOTE — ED Triage Notes (Signed)
Pt arrives to ED from home with complaints of headache, for which she took a tramadol and half a hydrocodone, at which point she developed severe nausea but did not vomit. EMS reports pt's headache is still there but dull; nausea gone. Pt placed in position of comfort with bed locked and lowered, call bell in reach.

## 2018-09-09 NOTE — ED Provider Notes (Signed)
Medical screening examination/treatment/procedure(s) were conducted as a shared visit with non-physician practitioner(s) and myself.  I personally evaluated the patient during the encounter.  None    Charlesetta Shanks, MD 09/10/18 1306

## 2018-09-09 NOTE — ED Notes (Signed)
Patient verbalizes understanding of discharge instructions. Opportunity for questioning and answers were provided. Armband removed by staff, pt discharged from ED ambulatory.   

## 2018-09-09 NOTE — ED Notes (Signed)
ED Provider at bedside for re-evaluation 

## 2018-09-09 NOTE — ED Provider Notes (Signed)
[facility] EMERGENCY DEPARTMENT Provider Note   CSN: [id number] Arrival date & time: 09/09/18  0935    History   Chief Complaint Chief Complaint  Patient presents with  . Headache    HPI Jeanette [name] is a 84 y.o. female with a PMH of atrial fibrillation on warfarin, Anxiety, HTN, HLD, Breast Cancer, and CVA in 2018 and 2019 presenting with a constant throbbing frontal bilateral headache onset today at 630am. Husband is a contributing historian. Patient reports she has tried tramadol and 1/2 hydrocodone with partial relief. Patient states she has had migraines years ago, but states this headache is worse than previous headaches. Patient reports an episode of nausea, shortness of breath, and middle non radiating pressure chest pain at 8am and states it lasted for about 20 minutes. Chest pain has resolved. Patient denies missing any doses of coumadin and states her last INR was 2.3. Patient denies neck pain, vision changes, weakness, numbness, dizziness, or trouble speaking. Patient denies fever, chills, cough, congestion, or sick exposures. Patient denies leg edema/pain, recent travel, or recent surgery. Patient denies recent injuries.     HPI  Past Medical History:  Diagnosis Date  . A-fib ([place]) 03/15/2018  . Anal cancer ([place])   . Anxiety   . Arthritis   . Breast cancer ([place])   . Breast cancer ([place])   . Cancer of breast ([place])   . Depression   . Dysrhythmia 2010   a-fib  . Hyperlipidemia   . Hypertension   . PVD (peripheral vascular disease) ([place])   . RAS (renal artery stenosis) ([place])   . Stroke [facility])    no residual    Patient Active Problem List   Diagnosis Date Noted  . CVA (cerebral vascular accident) ([place]) 03/15/2018  . A-fib ([place]) 03/15/2018  . Aphasia   . Hyperlipidemia   . Bad headache   . Renal artery stenosis ([place]) 10/21/2013  . Essential hypertension 10/21/2013  . PALPITATIONS, RECURRENT 06/22/2009  . STRESS ELECTROCARDIOGRAM, ABNORMAL  06/22/2009  . SHORTNESS OF BREATH 05/25/2009  . DYSLIPIDEMIA 03/21/2009  . ANEMIA 03/21/2009  . CEREBRAL EMBOLISM WITH CEREBRAL INFARCTION 03/21/2009  . DEGENERATIVE DISC DISEASE 03/21/2009  . CHEST PAIN-UNSPECIFIED 03/21/2009    Past Surgical History:  Procedure Laterality Date  . BREAST BIOPSY Left 01/14/2003  . BREAST LUMPECTOMY Right 1989  . CARDIOVASCULAR STRESS TEST  07/27/2003   No evidence if LV myocardial ischemia or scar. LV EF 74%.  [facility] Kitchen GYNECOLOGIC CRYOSURGERY    . OPEN REDUCTION INTERNAL FIXATION (ORIF) DISTAL RADIAL FRACTURE Left 02/19/2017   Procedure: OPEN REDUCTION INTERNAL FIXATION (ORIF) LEFT DISTAL RADIAL FRACTURE;  Surgeon: Leanora Cover, MD;  Location: [facility];  Service: Orthopedics;  Laterality: Left;  . OVARIAN CYST REMOVAL    . RENAL DOPPLER  09/09/2012   Right proximal renal artery: 60-99% diameter reduction. Normal patency of the left main renal artery.   . TRANSTHORACIC ECHOCARDIOGRAM  03/22/2006   EF 55-60%, mild mitral valvular regurg, pulmonary artery systolic pressure was moderately increased.  . TUBAL LIGATION       OB History    Gravida  1   Para  1   Term  1   Preterm      AB      Living  1     SAB      TAB      Ectopic      Multiple      Live Births  1  Home Medications    Prior to Admission medications   Medication Sig Start Date End Date Taking? Authorizing Provider  aspirin 325 MG tablet Take 1 tablet (325 mg total) by mouth daily. 03/18/18  Yes Shelly Coss, MD  cholecalciferol (VITAMIN D) 1000 units tablet Take 1,000 Units by mouth daily.   Yes [provider]  Coenzyme Q10 (COQ10 PO) Take 1 capsule by mouth daily.   Yes [provider]  escitalopram (LEXAPRO) 10 MG tablet Take 10 mg by mouth daily.     Yes [provider]  gabapentin (NEURONTIN) 100 MG capsule Take 100 mg by mouth once a week.  04/03/18  Yes [provider]  loratadine (CLARITIN) 10 MG  tablet Take 10 mg by mouth daily as needed for allergies.   Yes [provider]  metoprolol (TOPROL-XL) 200 MG 24 hr tablet Take 100 mg by mouth 2 (two) times daily.    Yes [provider]  olmesartan (BENICAR) 20 MG tablet Take 10 mg by mouth daily.    Yes [provider]  vitamin B-12 (CYANOCOBALAMIN) 100 MCG tablet Take 50 mcg by mouth daily.     Yes [provider]  warfarin (COUMADIN) 5 MG tablet Take 2.5-5 mg by mouth daily. Take 5mg  on Mon, Wed and Fri. Take 2.5mg  tablet all other days   Yes [provider]  omeprazole (PRILOSEC) 20 MG capsule Take 1 capsule (20 mg total) by mouth daily for 14 days. 09/09/18 09/23/18  Darlin Drop P, PA-C  topiramate (TOPAMAX) 25 MG tablet TAKE 1 TABLET BY MOUTH EVERY DAY Patient not taking: Reported on 09/09/2018 07/16/18   Venancio Poisson, NP    Family History Family History  Problem Relation Age of Onset  . Alzheimer's disease Mother   . Pneumonia Mother   . Heart failure Father     Social History Social History   Tobacco Use  . Smoking status: Never Smoker  . Smokeless tobacco: Never Used  Substance Use Topics  . Alcohol use: Yes    Alcohol/week: 2.0 standard drinks    Types: 2 Glasses of wine per week  . Drug use: No     Allergies   Benazepril hcl; Epinephrine; Lisinopril; Oxycodone; and Vytorin [ezetimibe-simvastatin]   Review of Systems Review of Systems  Constitutional: Negative for activity change, appetite change, chills, diaphoresis, fatigue, fever and unexpected weight change.  HENT: Negative for congestion, ear pain, sinus pressure and sore throat.   Eyes: Negative for photophobia and visual disturbance.  Respiratory: Positive for chest tightness and shortness of breath. Negative for cough.   Cardiovascular: Positive for chest pain. Negative for palpitations and leg swelling.  Gastrointestinal: Positive for nausea. Negative for abdominal pain, diarrhea and vomiting.    Genitourinary: Negative for dysuria.  Musculoskeletal: Negative for gait problem, myalgias, neck pain and neck stiffness.  Skin: Negative for rash.  Neurological: Positive for headaches. Negative for dizziness, seizures, syncope, speech difficulty, weakness and numbness.  Hematological: Does not bruise/bleed easily.  Psychiatric/Behavioral: Negative for sleep disturbance. The patient is not nervous/anxious.     Physical Exam Updated Vital Signs BP 129/65   Pulse 66   Temp 98.2 F (36.8 C) (Oral)   Resp 16   Ht 5\' 3"  (1.6 m)   Wt 70.3 kg   SpO2 96%   BMI 27.46 kg/m   Physical Exam Vitals signs and nursing note reviewed.  Constitutional:      General: She is not in acute distress.    Appearance: She  is well-developed. She is not diaphoretic.  HENT:     Head: Normocephalic and atraumatic.  Neck:     Musculoskeletal: Normal range of motion and neck supple.     Vascular: No JVD.  Cardiovascular:     Rate and Rhythm: Normal rate and regular rhythm.     Pulses: Normal pulses.          Radial pulses are 2+ on the right side and 2+ on the left side.       Dorsalis pedis pulses are 2+ on the right side and 2+ on the left side.     Heart sounds: Normal heart sounds. No murmur. No friction rub. No gallop.   Pulmonary:     Effort: Pulmonary effort is normal. No respiratory distress.     Breath sounds: Normal breath sounds. No wheezing or rales.  Chest:     Chest wall: No tenderness.  Abdominal:     Palpations: Abdomen is soft.     Tenderness: There is no abdominal tenderness.  Musculoskeletal: Normal range of motion.     Right lower leg: No edema.     Left lower leg: No edema.  Skin:    General: Skin is warm.     Capillary Refill: Capillary refill takes less than 2 seconds.     Coloration: Skin is not pale.     Findings: No rash.  Neurological:     Mental Status: She is alert and oriented to person, place, and time.    Mental Status:  Alert, oriented, thought content  appropriate, able to give a coherent history. Speech fluent without evidence of aphasia. Able to follow 2 step commands without difficulty.  Cranial Nerves:  II:  Peripheral visual fields grossly normal, pupils equal, round, reactive to light III,IV, VI: ptosis not present, extra-ocular motions intact bilaterally  V,VII: smile symmetric, facial light touch sensation equal VIII: hearing grossly normal to voice  X: uvula elevates symmetrically  XI: bilateral shoulder shrug symmetric and strong XII: midline tongue extension without fassiculations Motor:  Normal tone. 5/5 in upper and lower extremities bilaterally including strong and equal grip strength and dorsiflexion/plantar flexion Sensory: Pinprick and light touch normal in all extremities.  Deep Tendon Reflexes: 2+ and symmetric in the biceps and patella Cerebellar: normal finger-to-nose with bilateral upper extremities Gait: normal gait and balance.  Negative pronator drift. Negative Romberg sign. CV: distal pulses palpable throughout   ED Treatments / Results  Labs (all labs ordered are listed, but only abnormal results are displayed) Labs Reviewed  PROTIME-INR - Abnormal; Notable for the following components:      Result Value   Prothrombin Time 23.5 (*)    INR 2.1 (*)    All other components within normal limits  COMPREHENSIVE METABOLIC PANEL - Abnormal; Notable for the following components:   Glucose, Bld 107 (*)    Total Protein 6.3 (*)    All other components within normal limits  CBC WITH DIFFERENTIAL/PLATELET - Abnormal; Notable for the following components:   Hemoglobin 11.5 (*)    All other components within normal limits  I-STAT TROPONIN, ED  I-STAT TROPONIN, ED    EKG EKG Interpretation  Date/Time:  Tuesday September 09 2018 11:52:00 EST Ventricular Rate:  64 PR Interval:    QRS Duration: 115 QT Interval:  436 QTC Calculation: 450 R Axis:   38 Text Interpretation:  Sinus rhythm Nonspecific intraventricular  conduction delay Borderline T abnormalities, anterior leads Artifact in lead(s) I III aVR aVL aVF ble,  no sig change from old Confirmed by Charlesetta Shanks [phone]) on 09/09/2018 3:17:14 PM   Radiology Dg Chest 2 View  Result Date: 09/09/2018 CLINICAL DATA:  Chest pain EXAM: CHEST - 2 VIEW COMPARISON:  03/15/2018 FINDINGS: Heart and mediastinal contours are within normal limits. No focal opacities or effusions. No acute bony abnormality. IMPRESSION: No active cardiopulmonary disease. Electronically Signed   By: Rolm Baptise M.D.   On: 09/09/2018 11:16   Ct Head Wo Contrast  Result Date: 09/09/2018 CLINICAL DATA:  Acute frontal and bitemporal headache today. EXAM: CT HEAD WITHOUT CONTRAST TECHNIQUE: Contiguous axial images were obtained from the base of the skull through the vertex without intravenous contrast. COMPARISON:  Head CT and MRI 03/15/2018 FINDINGS: Brain: There is no evidence of acute infarct, intracranial hemorrhage, mass, midline shift, or extra-axial fluid collection. A chronic left parietal infarct is unchanged. Mild generalized cerebral atrophy is not greater than expected for age. Scattered cerebral white matter hypodensities are nonspecific but compatible with mild chronic small vessel ischemic disease, also not greater than expected for age. Vascular: Calcified atherosclerosis at the skull base. No hyperdense vessel. Skull: No fracture or focal osseous lesion. Sinuses/Orbits: Partially visualized mild mucosal thickening in the left maxillary sinus. Clear mastoid air cells. Bilateral cataract extraction. Other: None. IMPRESSION: 1. No evidence of acute intracranial abnormality. 2. Chronic left parietal infarct. Electronically Signed   By: Logan Bores M.D.   On: 09/09/2018 11:41    Procedures Procedures (including critical care time)  Medications Ordered in ED Medications  metoCLOPramide (REGLAN) injection 10 mg (10 mg Intravenous Given 09/09/18 1150)  famotidine (PEPCID) tablet 20 mg  (20 mg Oral Given 09/09/18 1529)     Initial Impression / Assessment and Plan / ED Course  I have reviewed the triage vital signs and the nursing notes.  Pertinent labs & imaging results that were available during my care of the patient were reviewed by me and considered in my medical decision making (see chart for details).  Clinical Course as of Sep 08 1528  Tue Sep 09, 2018  1103 INR is 2.1.  INR(!): 2.1 [AH]  1103 WBCs are within normal limits.  WBC: 5.9 [AH]  1132 No active cardiopulmonary disease.  DG Chest 2 View [AH]  1200 No evidence of acute intracranial abnormality. Chronic left parietal infarct.    CT HEAD WO CONTRAST [AH]  1343 Patient reports headache has improved.    [AH]    Clinical Course User Index [AH] Arville Lime, PA-C      Patient presents with a headache. Pt HA treated and improved while in ED.  Presentation is like pts typical HA and non concerning for [facility], ICH, Meningitis, or temporal arteritis. Pt is afebrile with no focal neuro deficits, nuchal rigidity, or change in vision. Neurological exam is normal. Pt is to follow up with PCP to discuss prophylactic medication. Pt verbalizes understanding and is agreeable with plan.  Chest pain is not likely of cardiac or pulmonary etiology d/t presentation, VSS, no tracheal deviation, no JVD or new murmur, RRR, breath sounds equal bilaterally, EKG without acute abnormalities, negative troponin x2, and negative CXR. Pt has been advised to return to the ED if CP becomes exertional, associated with diaphoresis or nausea, radiates to left jaw/arm, worsens or becomes concerning in any way. Patient has been asymptomatic while in the ER. Discussed case with attending and she recommends providing pepcid while in the ER. Will prescribe omeprazole. Patient is to be discharged with recommendation to  follow up with PCP in regards to today's hospital visit. Pt appears reliable for follow up and is agreeable to discharge.   Case  has been discussed with and seen by Dr. Johnney Killian who agrees with the above plan to discharge.   Final Clinical Impressions(s) / ED Diagnoses   Final diagnoses:  Bad headache  Atypical chest pain    ED Discharge Orders         Ordered    omeprazole (PRILOSEC) 20 MG capsule  Daily     09/09/18 1524           Darlin Drop [place], [place] 09/09/18 1534    Charlesetta Shanks, MD 09/10/18 1306

## 2018-09-17 ENCOUNTER — Ambulatory Visit: Payer: Medicare Other | Admitting: Adult Health

## 2018-09-17 NOTE — Progress Notes (Deleted)
[facility] [address] [place]. [place] [address] [phone]       OFFICE FOLLOW UP NOTE  [name] Date of Birth:  1935/06/18 Medical Record Number:  [id number]   Reason for Referral:  hospital stroke follow up  CHIEF COMPLAINT:  No chief complaint on file.   HPI: Jeanette [name] is being seen today for initial visit in the office for left occipital lobe infarct secondary to AF on Coumadin with subtherapeutic INR on 03/15/2018. History obtained from patient and chart review. Reviewed all radiology images and labs personally.  Jeanette [name] is a 84 y.o. female with history of a previous stroke, renal artery stenosis, hypertension, hyperlipidemia, breast cancer, anxiety, and atrial fibrillation on coumadin (INR 1.22)  who presented with aphasia and right hemianopia. She did not receive IV t-PA due to late presentation.   CT had reviewed and showed remote left parietal lobe infarct.  MRI brain reviewed and showed small acute infarct of the left occipital lobe.  CTA head and neck showed severe left M1 and M2 stenosis with moderate right M1 stenosis.  2D echo showed an EF of 60-65%.  Due to subtherapeutic INR level at 1.22, it was recommended to continue warfarin daily along with additional aspirin 325 mg daily and can discontinue aspirin once INR goal 2-3.  She declined use of DOACs at that time during admission but per notes, she was willing to read about them and discuss with her PCP.  LDL 125 and recommended starting Lipitor 40 mg daily.  HTN stable during admission and recommended long-term goal normotensive range.  Patient was discharged home in stable condition with recommendations of outpatient OT.  Patient is being seen today for hospital follow-up.  She states overall she has been recovering well but does have mild visual loss describing it as "splotchy" loss in her peripheral vision.  She also states that she was experiencing a headache 3 to 4 days prior  to her stroke and has been experiencing daily headaches with a pain level 2/10 and describing as just a constant pressure located in her facial region and posterior portion of head.  She was seen by her PCP recently who prescribed her gabapentin but continued to have headaches.  She does follow-up with her PCP for Coumadin management and INR level checks with recent INR level at 2 and per patient, was recommended to continue on Coumadin and aspirin at that time.  She denies any side effects of continued Coumadin and aspirin such as bleeding or bruising.  She has also been started on Repatha as she has had a difficult time tolerating statin use in the past.  She does state that she started using estradiol cream in April and she is questioning whether this could have led to the subtherapeutic INR level.  She does state that she read the pamphlet that is provided by the pharmacy after she returned home from her stroke and read that it could cause strokes along with symptoms of speech difficulty and visual loss.  She has since stopped using this cream and does not plan on restarting due to the increased risk of stroke.  No further concerns at this time.  Denies new or worsening stroke/TIA symptoms.    ROS:   14 system review of systems performed and negative with exception of weight gain, fatigue, blurred vision, diarrhea, urination problems, easy bruising, memory loss, headache, dizziness and decreased energy  PMH:  Past Medical History:  Diagnosis Date  .  A-fib ([place]) 03/15/2018  . Anal cancer ([place])   . Anxiety   . Arthritis   . Breast cancer ([place])   . Breast cancer ([place])   . Cancer of breast ([place])   . Depression   . Dysrhythmia 2010   a-fib  . Hyperlipidemia   . Hypertension   . PVD (peripheral vascular disease) ([place])   . RAS (renal artery stenosis) ([place])   . Stroke [facility])    no residual    PSH:  Past Surgical History:  Procedure Laterality Date  . BREAST BIOPSY Left 01/14/2003  . BREAST  LUMPECTOMY Right 1989  . CARDIOVASCULAR STRESS TEST  07/27/2003   No evidence if LV myocardial ischemia or scar. LV EF 74%.  Marland Kitchen GYNECOLOGIC CRYOSURGERY    . OPEN REDUCTION INTERNAL FIXATION (ORIF) DISTAL RADIAL FRACTURE Left 02/19/2017   Procedure: OPEN REDUCTION INTERNAL FIXATION (ORIF) LEFT DISTAL RADIAL FRACTURE;  Surgeon: Leanora Cover, MD;  Location: [facility];  Service: Orthopedics;  Laterality: Left;  . OVARIAN CYST REMOVAL    . RENAL DOPPLER  09/09/2012   Right proximal renal artery: 60-99% diameter reduction. Normal patency of the left main renal artery.   . TRANSTHORACIC ECHOCARDIOGRAM  03/22/2006   EF 55-60%, mild mitral valvular regurg, pulmonary artery systolic pressure was moderately increased.  . TUBAL LIGATION      Social History:  Social History   Socioeconomic History  . Marital status: Married    Spouse name: Not on file  . Number of children: Not on file  . Years of education: Not on file  . Highest education level: Not on file  Occupational History  . Not on file  Social Needs  . Financial resource strain: Not on file  . Food insecurity:    Worry: Not on file    Inability: Not on file  . Transportation needs:    Medical: Not on file    Non-medical: Not on file  Tobacco Use  . Smoking status: Never Smoker  . Smokeless tobacco: Never Used  Substance and Sexual Activity  . Alcohol use: Yes    Alcohol/week: 2.0 standard drinks    Types: 2 Glasses of wine per week  . Drug use: No  . Sexual activity: Yes    Partners: Male    Birth control/protection: Post-menopausal  Lifestyle  . Physical activity:    Days per week: Not on file    Minutes per session: Not on file  . Stress: Not on file  Relationships  . Social connections:    Talks on phone: Not on file    Gets together: Not on file    Attends religious service: Not on file    Active member of club or organization: Not on file    Attends meetings of clubs or organizations: Not on file     Relationship status: Not on file  . Intimate partner violence:    Fear of current or ex partner: Not on file    Emotionally abused: Not on file    Physically abused: Not on file    Forced sexual activity: Not on file  Other Topics Concern  . Not on file  Social History Narrative  . Not on file    Family History:  Family History  Problem Relation Age of Onset  . Alzheimer's disease Mother   . Pneumonia Mother   . Heart failure Father     Medications:   Current Outpatient Medications on File Prior to Visit  Medication Sig  Dispense Refill  . aspirin 325 MG tablet Take 1 tablet (325 mg total) by mouth daily. 30 tablet 0  . cholecalciferol (VITAMIN D) 1000 units tablet Take 1,000 Units by mouth daily.    . Coenzyme Q10 (COQ10 PO) Take 1 capsule by mouth daily.    Marland Kitchen escitalopram (LEXAPRO) 10 MG tablet Take 10 mg by mouth daily.      Marland Kitchen gabapentin (NEURONTIN) 100 MG capsule Take 100 mg by mouth once a week.   6  . loratadine (CLARITIN) 10 MG tablet Take 10 mg by mouth daily as needed for allergies.    . metoprolol (TOPROL-XL) 200 MG 24 hr tablet Take 100 mg by mouth 2 (two) times daily.     Marland Kitchen olmesartan (BENICAR) 20 MG tablet Take 10 mg by mouth daily.     Marland Kitchen omeprazole (PRILOSEC) 20 MG capsule Take 1 capsule (20 mg total) by mouth daily for 14 days. 14 capsule 0  . topiramate (TOPAMAX) 25 MG tablet TAKE 1 TABLET BY MOUTH EVERY DAY (Patient not taking: Reported on 09/09/2018) 90 tablet 0  . vitamin B-12 (CYANOCOBALAMIN) 100 MCG tablet Take 50 mcg by mouth daily.      Marland Kitchen warfarin (COUMADIN) 5 MG tablet Take 2.5-5 mg by mouth daily. Take 5mg  on Mon, Wed and Fri. Take 2.5mg  tablet all other days     No current facility-administered medications on file prior to visit.     Allergies:   Allergies  Allergen Reactions  . Benazepril Hcl     cough  . Epinephrine     Shortness of breath and increased heart rate  . Lisinopril     Cough  . Oxycodone Nausea And Vomiting    Slurred speech,  weakness, nausea  . Vytorin [Ezetimibe-Simvastatin]      Physical Exam  There were no vitals filed for this visit. There is no height or weight on file to calculate BMI. No exam data present  General: well developed, well nourished, pleasant elderly Caucasian female, seated, in no evident distress Head: head normocephalic and atraumatic.   Neck: supple with no carotid or supraclavicular bruits Cardiovascular: regular rate and rhythm, no murmurs Musculoskeletal: no deformity Skin:  no rash/petichiae Vascular:  Normal pulses all extremities  Neurologic Exam Mental Status: Awake and fully alert. Oriented to place and time. Recent and remote memory intact. Attention span, concentration and fund of knowledge appropriate. Mood and affect appropriate.  Cranial Nerves: Fundoscopic exam reveals sharp disc margins. Pupils equal, briskly reactive to light. Extraocular movements full without nystagmus. Visual fields full to confrontation with subjective blurriness in all 4 quadrants. Hearing intact. Facial sensation intact. Face, tongue, palate moves normally and symmetrically.  Motor: Normal bulk and tone. Normal strength in all tested extremity muscles. Sensory.: intact to touch , pinprick , position and vibratory sensation.  Coordination: Rapid alternating movements normal in all extremities. Finger-to-nose and heel-to-shin performed accurately bilaterally. Gait and Station: Arises from chair without difficulty. Stance is normal. Gait demonstrates normal stride length and balance . Able to heel, toe and tandem walk without difficulty.  Reflexes: 1+ and symmetric. Toes downgoing.    NIHSS  1 Modified Rankin  1 HAS-BLED 2 CHA2DS2-VASc 6   Diagnostic Data (Labs, Imaging, Testing)  Ct Angio Head W Or Wo Contrast Ct Angio Neck W Or Wo Contrast 03/15/2018 IMPRESSION:  1. No emergent large vessel occlusion.  2. Progressive intracranial atherosclerosis with severe left M1 and M2 stenoses,  moderate right M1 stenosis, and mild left P2 stenosis.  3. Widely patent cervical carotid arteries.  4. New moderate right vertebral artery origin stenosis.  5.  Aortic Atherosclerosis (ICD10-I70.0).   Dg Chest 2 View 03/15/2018 IMPRESSION:  No active cardiopulmonary disease.   Mr Brain Wo Contrast 03/16/2018 IMPRESSION:  Small, acute, nonhemorrhagic infarct of the left occipital lobe.   Ct Head Code Stroke Wo Contrast 03/15/2018 IMPRESSION:  1. Noacute intracranial abnormality or significant interval change.  2. Mild atrophy and white matter disease is stable.  3. Remote left parietal lobe infarct is stable.  4. ASPECTS is 10/10   ECHOCARDIOGRAM 03/16/2018 Study Conclusions - Left ventricle: The cavity size was normal. There was mild   concentric hypertrophy. Systolic function was normal. The   estimated ejection fraction was in the range of 60% to 65%. Wall   motion was normal; there were no regional wall motion   abnormalities. Features are consistent with a pseudonormal left   ventricular filling pattern, with concomitant abnormal relaxation   and increased filling pressure (grade 2 diastolic dysfunction). - Aortic valve: Transvalvular velocity was within the normal range.   There was no stenosis. There was no regurgitation. - Mitral valve: Transvalvular velocity was within the normal range.   There was no evidence for stenosis. There was trivial   regurgitation. - Left atrium: The atrium was mildly dilated. - Right ventricle: The cavity size was normal. Wall thickness was   normal. Systolic function was normal. - Atrial septum: No defect or patent foramen ovale was identified   by color flow Doppler. - Tricuspid valve: There was trivial regurgitation. - Pulmonary arteries: Systolic pressure was within the normal   range. PA peak pressure: 25 mm Hg (S).   ASSESSMENT: Jeanette [name] is a 84 y.o. year old female here with left occipital infarcts on 03/15/2018 secondary to  PAF on Coumadin with subtherapeutic INR. Vascular risk factors include prior infarcts, HTN, HLD and PAF.  Patient is being seen today for hospital follow-up and does continue to have some decrease in visual acuity but otherwise has been stable.  Patient was on estradiol cream prior to her stroke and is questioning whether this could have caused her subtherapeutic INR level.    PLAN: -Continue aspirin 325 mg daily and warfarin daily  and Repatha for secondary stroke prevention -F/u with PCP regarding your HLD and HTN management -per Up-To-Date, estradiol cream can increase risk of stroke along with diminishing effects of anticoagulation therapy therefore was highly recommended to not restart use of estradiol cream.  Did advise patient that it is difficult to tell whether this was the exact cause of her subtherapeutic INR but as there is a chance that her INR level could be lowered from use of this cream, not recommended to restart.  Patient verbalized understanding -Due to continued daily headaches recommended to start Topamax 25 mg daily and advised patient that after 1 to 2 weeks, if headaches continue we can consider increasing frequency to 25 mg twice daily. -continue to monitor BP at home -advised to continue to stay active and maintain a healthy diet -Maintain strict control of hypertension with blood pressure goal below 130/90, diabetes with hemoglobin A1c goal below 6.5% and cholesterol with LDL cholesterol (bad cholesterol) goal below 70 mg/dL. I also advised the patient to eat a healthy diet with plenty of whole grains, cereals, fruits and vegetables, exercise regularly and maintain ideal body weight.  Follow up in 3 months or call earlier if needed   Greater than 50% of time during this  25 minute visit was spent on counseling,explanation of diagnosis of left occipital infarct, reviewing risk factor management of PAF, HTN and HLD, planning of further management, discussion with patient and  family and coordination of care    Venancio Poisson, AGNP-BC  [facility] [address] [place], [place] [address]  Phone [phone] Fax [phone] Note: This document was prepared with digital dictation and possible smart phrase technology. Any transcriptional errors that result from this process are unintentional.

## 2018-09-18 ENCOUNTER — Encounter: Payer: Self-pay | Admitting: Adult Health

## 2018-10-20 ENCOUNTER — Other Ambulatory Visit: Payer: Self-pay

## 2018-10-20 ENCOUNTER — Ambulatory Visit (INDEPENDENT_AMBULATORY_CARE_PROVIDER_SITE_OTHER): Payer: Medicare Other | Admitting: Adult Health

## 2018-10-20 ENCOUNTER — Encounter: Payer: Self-pay | Admitting: Adult Health

## 2018-10-20 DIAGNOSIS — I63432 Cerebral infarction due to embolism of left posterior cerebral artery: Secondary | ICD-10-CM | POA: Diagnosis not present

## 2018-10-20 DIAGNOSIS — I10 Essential (primary) hypertension: Secondary | ICD-10-CM | POA: Diagnosis not present

## 2018-10-20 DIAGNOSIS — I480 Paroxysmal atrial fibrillation: Secondary | ICD-10-CM

## 2018-10-20 DIAGNOSIS — E785 Hyperlipidemia, unspecified: Secondary | ICD-10-CM | POA: Diagnosis not present

## 2018-10-20 NOTE — Progress Notes (Signed)
I agree with the above plan 

## 2018-10-20 NOTE — Progress Notes (Signed)
[facility] [address] [place]. [place] [address] [phone]     Virtual Visit via Telephone Note  I connected with Jeanette [name] on 10/20/18 at 12:45 PM EDT by telephone located remotely within my own home and verified that I am speaking with the correct person using two identifiers who reports being located at her home.    I discussed the limitations, risks, security and privacy concerns of performing an evaluation and management service by telephone and the availability of in person appointments. I also discussed with the patient that there may be a patient responsible charge related to this service. The patient expressed understanding and agreed to proceed.   History of Present Illness:  Jeanette [name] is a 84 y.o. female who has been followed in this office for left occipital lobe infarct secondary to AF on warfarin with subtherapeutic INR in 03/2018.  She was initially scheduled for face-to-face office visit today at this time but due to [place], face-to-face office visit rescheduled for non-face-to-face telephone visit.  At prior office visit on 04/23/2018, she continued to have residual deficits of "splotchy" peripheral vision issues along with daily headache.  She states she has been doing well from a stroke standpoint with improvement of vision and will only get "foggy over left eye" on occasion.  She was evaluated by ophthalmology without any concerning findings.  She was initiated on gabapentin by PCP along with initiating Topamax 25 mg daily after prior visit due to reported daily headaches.  It was recommended to contact office after 1 to 2 weeks for potential need of dosage increase.  She has not contacted office since this time for any headache concerns.  She was evaluated at [facility] ED on 09/09/2018 due to frontal bilateral headache with chest pain, tooth pain, facial numbness.  All cardiac and neurological work-up negative and diagnosed with migraine and  discharged home to follow-up with PCP. She states that she never started topamax after recommendation at prior visit due to potential side effects but headaches improved over time. She feels occasional pressure in the back of her head but not debilitating. She denies recurrent headache since ED visit. Hx of migraines and will use gabapentin 100 mg capsule as needed with benefit.  Continues on warfarin without side effects of bleeding or bruising and stable INR levels.  She is currently not on any HDL management medications due to statin intolerance and unable to afford Repatha.  She did have scheduled follow-up visit with PCP but due to current precautions, lab work not obtained but plans on following up once precautions lifted for repeat lab work.  Blood pressure stable.  No further concerns.  Denies new or worsening stroke/TIA symptoms.      Observations/Objective:  *Limited exam due to visit type*  General: well developed, well nourished, pleasant elderly Caucasian female, seated, in no evident distress  Mental Status: Awake and fully alert. Oriented to place and time. Recent and remote memory intact. Attention span, concentration and fund of knowledge appropriate. Mood and affect appropriate.  Assessment and Plan:  Jeanette [name] is a 84 y.o. year old female here with left occipital infarcts on 03/15/2018 secondary to PAF on Coumadin with subtherapeutic INR. Vascular risk factors include prior infarcts, HTN, HLD and PAF.   She has been stable from a stroke standpoint with only residual deficits of occasional minor headaches and occasional visual symptoms.  -Continue on warfarin daily for secondary stroke prevention -F/u with PCP regarding your  atrial fibrillation,  HLD and HTN management -request repeat lipid panel at follow-up visit with PCP and may need to consider initiating therapy if LDL greater than 70 -Continue to follow with PCP regarding headache management -continue to monitor BP at  home -advised to continue to stay active and maintain a healthy diet -Maintain strict control of hypertension with blood pressure goal below 130/90, diabetes with hemoglobin A1c goal below 6.5% and cholesterol with LDL cholesterol (bad cholesterol) goal below 70 mg/dL. I also advised the patient to eat a healthy diet with plenty of whole grains, cereals, fruits and vegetables, exercise regularly and maintain ideal body weight.   Follow Up Instructions:   Stable from stroke standpoint and will follow-up as needed    I discussed the assessment and treatment plan with the patient.  The patient was provided an opportunity to ask questions and all were answered to their satisfaction. The patient agreed with the plan and verbalized an understanding of the instructions.   I provided 26 minutes of non-face-to-face time during this encounter.    Venancio Poisson, AGNP-BC  [facility] [address] [place], [place] [address]  Phone [phone] Fax [phone] Note: This document was prepared with digital dictation and possible smart phrase technology. Any transcriptional errors that result from this process are unintentional.

## 2019-02-16 ENCOUNTER — Other Ambulatory Visit: Payer: Self-pay

## 2019-02-16 ENCOUNTER — Ambulatory Visit (INDEPENDENT_AMBULATORY_CARE_PROVIDER_SITE_OTHER)
Admission: EM | Admit: 2019-02-16 | Discharge: 2019-02-16 | Disposition: A | Discharge status: Other Acute Inpatient Hospital | Payer: Medicare Other | Source: Home / Self Care | Attending: Internal Medicine | Admitting: Internal Medicine

## 2019-02-16 ENCOUNTER — Encounter (HOSPITAL_COMMUNITY): Payer: Self-pay | Admitting: Emergency Medicine

## 2019-02-16 ENCOUNTER — Emergency Department (HOSPITAL_COMMUNITY)
Admission: EM | Admit: 2019-02-16 | Discharge: 2019-02-16 | Discharge status: Against Medical Advice | Payer: Medicare Other | Attending: Emergency Medicine | Admitting: Emergency Medicine

## 2019-02-16 DIAGNOSIS — Z5321 Procedure and treatment not carried out due to patient leaving prior to being seen by health care provider: Secondary | ICD-10-CM | POA: Insufficient documentation

## 2019-02-16 DIAGNOSIS — R0789 Other chest pain: Secondary | ICD-10-CM | POA: Insufficient documentation

## 2019-02-16 DIAGNOSIS — R002 Palpitations: Secondary | ICD-10-CM | POA: Diagnosis present

## 2019-02-16 DIAGNOSIS — R079 Chest pain, unspecified: Secondary | ICD-10-CM | POA: Diagnosis not present

## 2019-02-16 LAB — CBC
HCT: 37.3 % (ref 36.0–46.0)
Hemoglobin: 11.5 g/dL — ABNORMAL LOW (ref 12.0–15.0)
MCH: 29.6 pg (ref 26.0–34.0)
MCHC: 30.8 g/dL (ref 30.0–36.0)
MCV: 96.1 fL (ref 80.0–100.0)
Platelets: 147 10*3/uL — ABNORMAL LOW (ref 150–400)
RBC: 3.88 MIL/uL (ref 3.87–5.11)
RDW: 15.9 % — ABNORMAL HIGH (ref 11.5–15.5)
WBC: 3.1 10*3/uL — ABNORMAL LOW (ref 4.0–10.5)
nRBC: 0 % (ref 0.0–0.2)

## 2019-02-16 LAB — BASIC METABOLIC PANEL
Anion gap: 8 (ref 5–15)
BUN: 9 mg/dL (ref 8–23)
CO2: 28 mmol/L (ref 22–32)
Calcium: 10.7 mg/dL — ABNORMAL HIGH (ref 8.9–10.3)
Chloride: 102 mmol/L (ref 98–111)
Creatinine, Ser: 0.78 mg/dL (ref 0.44–1.00)
GFR calc Af Amer: >60 mL/min (ref >60)
GFR calc non Af Amer: >60 mL/min (ref >60)
Glucose, Bld: 106 mg/dL — ABNORMAL HIGH (ref 70–99)
Potassium: 4 mmol/L (ref 3.5–5.1)
Sodium: 138 mmol/L (ref 135–145)

## 2019-02-16 LAB — TROPONIN I (HIGH SENSITIVITY): Troponin I (High Sensitivity): 4 ng/L (ref <18)

## 2019-02-16 MED ORDER — SODIUM CHLORIDE 0.9% FLUSH: 3.0000 mL | Freq: Once | INTRAVENOUS | Status: DC

## 2019-02-16 NOTE — ED Notes (Signed)
Pt states that does not want to wait any longer to be seen, pt upset about wait times, pt states that she needs to be in the back on a monitor and her care should have already been started. Pt refuses to stay to be seen

## 2019-02-16 NOTE — ED Notes (Signed)
Pt here with CP and palpitations with hx of afib; per SN to go to ED for further eval; pt agreeable

## 2019-02-16 NOTE — ED Triage Notes (Signed)
Pt sent from urgent care for evaluation of palpitations and subsequent CP/pressure, pt has hx of afib, reports that she took metoprolol xl 30 minutes prior to arrival, denies any current symptoms at this time.

## 2019-03-05 ENCOUNTER — Other Ambulatory Visit: Payer: Self-pay

## 2019-03-05 ENCOUNTER — Ambulatory Visit: Payer: Medicare Other | Admitting: Cardiology

## 2019-03-05 NOTE — Progress Notes (Signed)
Appt rescheduled

## 2019-03-06 ENCOUNTER — Ambulatory Visit (INDEPENDENT_AMBULATORY_CARE_PROVIDER_SITE_OTHER): Payer: Medicare Other | Admitting: Cardiology

## 2019-03-06 ENCOUNTER — Ambulatory Visit: Payer: Medicare Other

## 2019-03-06 ENCOUNTER — Encounter: Payer: Self-pay | Admitting: Cardiology

## 2019-03-06 VITALS — BP 135/60 | HR 70 | Temp 98.0°F | Ht 63.0 in | Wt 162.0 lb

## 2019-03-06 DIAGNOSIS — R002 Palpitations: Secondary | ICD-10-CM

## 2019-03-06 DIAGNOSIS — R0789 Other chest pain: Secondary | ICD-10-CM | POA: Diagnosis not present

## 2019-03-06 DIAGNOSIS — I480 Paroxysmal atrial fibrillation: Secondary | ICD-10-CM

## 2019-03-06 DIAGNOSIS — Z8673 Personal history of transient ischemic attack (TIA), and cerebral infarction without residual deficits: Secondary | ICD-10-CM | POA: Insufficient documentation

## 2019-03-06 DIAGNOSIS — R0602 Shortness of breath: Secondary | ICD-10-CM | POA: Diagnosis not present

## 2019-03-06 MED ORDER — ASPIRIN EC 81 MG PO TBEC: 81.0000 mg | DELAYED_RELEASE_TABLET | Freq: Every day | ORAL | 3 refills | Status: DC

## 2019-03-06 NOTE — Progress Notes (Signed)
Patient referred by Crist Infante, MD for chest pressure, shortness of breath.   Subjective:   Jeanette [name], female    DOB: 1935-05-09, 84 y.o.   MRN: [id number]   Chief Complaint  Patient presents with  . Shortness of Breath  . Chest Pain    HPI  84 y.o. Caucasian female with hypertension, paroxysmal atrial fibrillation, h/o stroke, with chest pressure, shortness of breath.  Patient is a retired Building services engineer. She had COVID pneumonia in July 2020, and has since recovered. Last few weeks, she has noticed episodes of chest pressure radiating to her neck, associated with shortness of breath. Episodes occur both at rest and exertion, and last for about 45 minutes. While patient is convinced that her episodes are related to Afib, there hasn't been any EKG or telemetry documentation showing Afib recently.   Past Medical History:  Diagnosis Date  . A-fib ([place]) 03/15/2018  . Anal cancer ([place])   . Anxiety   . Arthritis   . Breast cancer ([place])   . Breast cancer ([place])   . Cancer of breast ([place])   . Depression   . Dysrhythmia 2010   a-fib  . Hyperlipidemia   . Hypertension   . PVD (peripheral vascular disease) ([place])   . RAS (renal artery stenosis) ([place])   . Stroke [facility])    no residual     Past Surgical History:  Procedure Laterality Date  . BREAST BIOPSY Left 01/14/2003  . BREAST LUMPECTOMY Right 1989  . CARDIOVASCULAR STRESS TEST  07/27/2003   No evidence if LV myocardial ischemia or scar. LV EF 74%.  Marland Kitchen GYNECOLOGIC CRYOSURGERY    . OPEN REDUCTION INTERNAL FIXATION (ORIF) DISTAL RADIAL FRACTURE Left 02/19/2017   Procedure: OPEN REDUCTION INTERNAL FIXATION (ORIF) LEFT DISTAL RADIAL FRACTURE;  Surgeon: Leanora Cover, MD;  Location: [place];  Service: Orthopedics;  Laterality: Left;  . OVARIAN CYST REMOVAL    . RENAL DOPPLER  09/09/2012   Right proximal renal artery: 60-99% diameter reduction. Normal patency of the left main renal artery.   . TRANSTHORACIC  ECHOCARDIOGRAM  03/22/2006   EF 55-60%, mild mitral valvular regurg, pulmonary artery systolic pressure was moderately increased.  . TUBAL LIGATION       Social History   Socioeconomic History  . Marital status: Married    Spouse name: Not on file  . Number of children: Not on file  . Years of education: Not on file  . Highest education level: Not on file  Occupational History  . Not on file  Social Needs  . Financial resource strain: Not on file  . Food insecurity    Worry: Not on file    Inability: Not on file  . Transportation needs    Medical: Not on file    Non-medical: Not on file  Tobacco Use  . Smoking status: Never Smoker  . Smokeless tobacco: Never Used  Substance and Sexual Activity  . Alcohol use: Yes    Alcohol/week: 2.0 standard drinks    Types: 2 Glasses of wine per week  . Drug use: No  . Sexual activity: Yes    Partners: Male    Birth control/protection: Post-menopausal  Lifestyle  . Physical activity    Days per week: Not on file    Minutes per session: Not on file  . Stress: Not on file  Relationships  . Social Herbalist on phone: Not on file    Gets together: Not  on file    Attends religious service: Not on file    Active member of club or organization: Not on file    Attends meetings of clubs or organizations: Not on file    Relationship status: Not on file  . Intimate partner violence    Fear of current or ex partner: Not on file    Emotionally abused: Not on file    Physically abused: Not on file    Forced sexual activity: Not on file  Other Topics Concern  . Not on file  Social History Narrative  . Not on file     Family History  Problem Relation Age of Onset  . Alzheimer's disease Mother   . Pneumonia Mother   . Heart failure Father      Current Outpatient Medications on File Prior to Visit  Medication Sig Dispense Refill  . cholecalciferol (VITAMIN D) 1000 units tablet Take 1,000 Units by mouth daily.    .  Coenzyme Q10 (COQ10 PO) Take 1 capsule by mouth daily.    Marland Kitchen escitalopram (LEXAPRO) 10 MG tablet Take 10 mg by mouth daily.      Marland Kitchen gabapentin (NEURONTIN) 100 MG capsule Take 100 mg by mouth once a week.   6  . loratadine (CLARITIN) 10 MG tablet Take 10 mg by mouth daily as needed for allergies.    . metoprolol (TOPROL-XL) 200 MG 24 hr tablet Take 100 mg by mouth 2 (two) times daily.     Marland Kitchen olmesartan (BENICAR) 20 MG tablet Take 10 mg by mouth daily.     Marland Kitchen omeprazole (PRILOSEC) 20 MG capsule Take 1 capsule (20 mg total) by mouth daily for 14 days. 14 capsule 0  . vitamin B-12 (CYANOCOBALAMIN) 100 MCG tablet Take 50 mcg by mouth daily.      Marland Kitchen warfarin (COUMADIN) 5 MG tablet Take 2.5-5 mg by mouth daily. Take 87m on Mon, Wed and Fri. Take 2.560mtablet all other days     No current facility-administered medications on file prior to visit.     Cardiovascular studies:  EKG 03/06/2019: Sinus rhythm 66 bpm. Normal EKG.  EKG 02/20/2019: Sinus rhythm 92 bpm. Normal EKG.   Echocardiogram 03/16/2018: Mild LVH. EF 60-65%. Grade 2 DD, elevated filling pressures. Mild LA dilatation. Trivial MR, TR. PASP 25 mmHg.  Renal [place]: 1. No evidence of renal artery stenosis. 2. Very mild asymmetric thinning of the right renal cortex. Otherwise, normal renal ultrasound. Specifically, no evidence of urinary obstruction. 3.  Aortic Atherosclerosis (ICD10-I70.0).   Recent labs: 02/20/2019: Glucose 85, BUN/Cr 12/0.9. eGFR 59. Na/K 137/4.2. H/H 10.8/34.2. MCV 94. Platelets 160.  TSH 3.4 normal.  02/16/2019: HS trop 5   Review of Systems  Constitution: Negative for decreased appetite, malaise/fatigue, weight gain and weight loss.  HENT: Negative for congestion.   Eyes: Negative for visual disturbance.  Cardiovascular: Positive for chest pain and dyspnea on exertion. Negative for leg swelling, palpitations and syncope.  Respiratory: Positive for shortness of breath. Negative for cough.    Endocrine: Negative for cold intolerance.  Hematologic/Lymphatic: Does not bruise/bleed easily.  Skin: Negative for itching and rash.  Musculoskeletal: Negative for myalgias.  Gastrointestinal: Negative for abdominal pain, nausea and vomiting.  Genitourinary: Negative for dysuria.  Neurological: Negative for dizziness and weakness.  Psychiatric/Behavioral: The patient is not nervous/anxious.   All other systems reviewed and are negative.        Vitals:   03/06/19 1116  BP: 135/60  Pulse: 70  Temp:  49 F (36.7 C)  SpO2: 95%     Body mass index is 28.7 kg/m. Filed Weights   03/06/19 1116  Weight: 162 lb (73.5 kg)     Objective:   Physical Exam  Constitutional: She is oriented to person, place, and time. She appears well-developed and well-nourished. No distress.  HENT:  Head: Normocephalic and atraumatic.  Eyes: Pupils are equal, round, and reactive to light. Conjunctivae are normal.  Neck: No JVD present.  Cardiovascular: Normal rate, regular rhythm and intact distal pulses.  Pulmonary/Chest: Effort normal and breath sounds normal. She has no wheezes. She has no rales.  Abdominal: Soft. Bowel sounds are normal. There is no rebound.  Musculoskeletal:        General: No edema.  Lymphadenopathy:    She has no cervical adenopathy.  Neurological: She is alert and oriented to person, place, and time. No cranial nerve deficit.  Skin: Skin is warm and dry.  Psychiatric: She has a normal mood and affect.  Nursing note and vitals reviewed.      Assessment & Recommendations:   84 y.o. Caucasian female with hypertension, paroxysmal atrial fibrillation, h/o stroke, with chest pressure, shortness of breath.  Chest pressure, shortness of breath: Symptoms are concerning for angina. These episodes may or may not be related to Afib. Recommend event monitor to establish or refute this association. Also recommend echocardiogram and lexiscan nuclear stress test.  She is  currently on Aspirin 325 mg daily. Do not recommend high dose Aspiring, especially while on warfarin. Recommend reducing to 81 mg daily pending further ischemia workup.  SL NTG prescribed for as needed use.   Paroxysmal Afib: Currently in sinus rhythm. CHA2DS2VASc score 6, annual stroke risk 8% She has been therapeutic with warfarin for many years and would like to continue the same.   Hypertension: Controlled.  Further recommendations after above tests.    Thank you for referring the patient to [place]. Please feel free to contact with any questions.  Nigel Mormon, MD [facility] Pager: [phone] Office: [phone] If no answer Cell [phone]

## 2019-03-07 ENCOUNTER — Encounter: Payer: Self-pay | Admitting: Cardiology

## 2019-03-07 MED ORDER — NITROGLYCERIN 0.4 MG SL SUBL: 0.4000 mg | SUBLINGUAL_TABLET | SUBLINGUAL | 3 refills | Status: DC | PRN

## 2019-03-25 ENCOUNTER — Ambulatory Visit (INDEPENDENT_AMBULATORY_CARE_PROVIDER_SITE_OTHER): Payer: Medicare Other

## 2019-03-25 ENCOUNTER — Other Ambulatory Visit: Payer: Self-pay

## 2019-03-25 DIAGNOSIS — R0602 Shortness of breath: Secondary | ICD-10-CM | POA: Diagnosis not present

## 2019-03-25 DIAGNOSIS — R0789 Other chest pain: Secondary | ICD-10-CM | POA: Diagnosis not present

## 2019-03-26 ENCOUNTER — Telehealth: Payer: Self-pay

## 2019-03-26 NOTE — Telephone Encounter (Signed)
Okay with me. She may return it to the company before her trip.

## 2019-03-26 NOTE — Telephone Encounter (Signed)
Informed patient she may remove monitor and mail it back to the company

## 2019-03-26 NOTE — Progress Notes (Signed)
Gave patient results. She verbalized understanding.

## 2019-03-26 NOTE — Telephone Encounter (Signed)
Patient called and states she would like to remove heart monitor before she leaves for the beach on 04/05/19. She states she is feeling fine and having no further episodes. Please advise

## 2019-04-11 NOTE — Progress Notes (Signed)
Patient referred by Crist Infante, MD for chest pressure, shortness of breath.   Subjective:   Jeanette [name], female    DOB: 21-May-1935, 84 y.o.   MRN: [id number]   Chief Complaint  Patient presents with  . Atrial Fibrillation    HPI  84 y.o. Caucasian female with hypertension, paroxysmal atrial fibrillation, h/o stroke, with chest pressure, shortness of breath.  She has not had any recurrence of chest pain. However, she notices that she has good days and bad days with her energy levels. She felt great in first week October, but has felt more tired and fatigued this week, along with shortness of breath. She wonders if some of these symptoms are residual effects from COVID that she had back in 01/2019.   Initial office visit: Patient is a retired Building services engineer. She had COVID pneumonia in July 2020, and has since recovered. Last few weeks, she has noticed episodes of chest pressure radiating to her neck, associated with shortness of breath. Episodes occur both at rest and exertion, and last for about 45 minutes. While patient is convinced that her episodes are related to Afib, there hasn't been any EKG or telemetry documentation showing Afib recently.   Past Medical History:  Diagnosis Date  . A-fib ([place]) 03/15/2018  . Anal cancer ([place])   . Anxiety   . Arthritis   . Breast cancer ([place])   . Breast cancer ([place])   . Cancer of breast ([place])   . Depression   . Dysrhythmia 2010   a-fib  . Hyperlipidemia   . Hypertension   . PVD (peripheral vascular disease) ([place])   . RAS (renal artery stenosis) ([place])   . Stroke [facility])    no residual     Past Surgical History:  Procedure Laterality Date  . BREAST BIOPSY Left 01/14/2003  . BREAST LUMPECTOMY Right 1989  . CARDIOVASCULAR STRESS TEST  07/27/2003   No evidence if LV myocardial ischemia or scar. LV EF 74%.  Marland Kitchen GYNECOLOGIC CRYOSURGERY    . OPEN REDUCTION INTERNAL FIXATION (ORIF) DISTAL RADIAL FRACTURE Left 02/19/2017   Procedure: OPEN  REDUCTION INTERNAL FIXATION (ORIF) LEFT DISTAL RADIAL FRACTURE;  Surgeon: Leanora Cover, MD;  Location: [place];  Service: Orthopedics;  Laterality: Left;  . OVARIAN CYST REMOVAL    . RENAL DOPPLER  09/09/2012   Right proximal renal artery: 60-99% diameter reduction. Normal patency of the left main renal artery.   . TRANSTHORACIC ECHOCARDIOGRAM  03/22/2006   EF 55-60%, mild mitral valvular regurg, pulmonary artery systolic pressure was moderately increased.  . TUBAL LIGATION       Social History   Socioeconomic History  . Marital status: Married    Spouse name: Not on file  . Number of children: 1  . Years of education: Not on file  . Highest education level: Not on file  Occupational History  . Not on file  Social Needs  . Financial resource strain: Not on file  . Food insecurity    Worry: Not on file    Inability: Not on file  . Transportation needs    Medical: Not on file    Non-medical: Not on file  Tobacco Use  . Smoking status: Never Smoker  . Smokeless tobacco: Never Used  Substance and Sexual Activity  . Alcohol use: Yes    Alcohol/week: 2.0 standard drinks    Types: 2 Glasses of wine per week  . Drug use: No  . Sexual activity: Yes  Partners: Male    Birth control/protection: Post-menopausal  Lifestyle  . Physical activity    Days per week: Not on file    Minutes per session: Not on file  . Stress: Not on file  Relationships  . Social Herbalist on phone: Not on file    Gets together: Not on file    Attends religious service: Not on file    Active member of club or organization: Not on file    Attends meetings of clubs or organizations: Not on file    Relationship status: Not on file  . Intimate partner violence    Fear of current or ex partner: Not on file    Emotionally abused: Not on file    Physically abused: Not on file    Forced sexual activity: Not on file  Other Topics Concern  . Not on file  Social History  Narrative  . Not on file     Family History  Problem Relation Age of Onset  . Alzheimer's disease Mother   . Pneumonia Mother   . Heart failure Father   . Heart disease Sister   . Heart disease Brother   . Heart disease Brother   . Heart disease Sister      Current Outpatient Medications on File Prior to Visit  Medication Sig Dispense Refill  . aspirin EC 81 MG tablet Take 1 tablet (81 mg total) by mouth daily. 30 tablet 3  . cholecalciferol (VITAMIN D) 1000 units tablet Take 1,000 Units by mouth daily.    . Coenzyme Q10 (COQ10 PO) Take 1 capsule by mouth daily.    Marland Kitchen escitalopram (LEXAPRO) 10 MG tablet Take 10 mg by mouth daily.      Marland Kitchen gabapentin (NEURONTIN) 100 MG capsule Take 100 mg by mouth once a week.   6  . loratadine (CLARITIN) 10 MG tablet Take 10 mg by mouth daily as needed for allergies.    . metoprolol (TOPROL-XL) 200 MG 24 hr tablet Take 100 mg by mouth 2 (two) times daily.     . nitroGLYCERIN (NITROSTAT) 0.4 MG SL tablet Place 1 tablet (0.4 mg total) under the tongue every 5 (five) minutes as needed for chest pain. 30 tablet 3  . olmesartan (BENICAR) 20 MG tablet Take 10 mg by mouth daily.     . vitamin B-12 (CYANOCOBALAMIN) 100 MCG tablet Take 50 mcg by mouth daily.      Marland Kitchen warfarin (COUMADIN) 5 MG tablet Take 2.5-5 mg by mouth daily. Take 58m on Mon, Wed and Fri. Take 2.547mtablet all other days     No current facility-administered medications on file prior to visit.     Cardiovascular studies:  Lexiscan Myoview stress test 04/20/2019: Lexiscan stress test was performed. Stress EKG is non-diagnostic, as this is pharmacological stress test. In addition, rest and stress EKG showed sinus rhythm/tachycardia, occasional PAC's, no ST-T abnormality.  SPECT stress and rest images demonstrate a small size, mild intensity perfusion defect in basal inferoseptal myocardium with minimal reversibility. Normal wall thickening. Stress LVEF 54%. Low risk study.   Event monitor  03/06/2019 - 04/04/2019: Dominant rhythm sinus. HR 52-111 bpm. Two auto triggered events-one showing 5 beat NSVT Several patient triggered episodes showing PAC/PVC's correlate with patients symptoms of chest pain, shortness of breath, skipped beats, fatigue.  Echocardiogram 03/25/2019: Left ventricle cavity is normal in size. Normal left ventricular wall thickness. Normal LV systolic function with EF 56%. Normal global wall motion. Doppler evidence of  grade I (impaired) diastolic dysfunction, normal LAP. Calculated EF 56%. Trileaflet aortic valve. Trace aortic regurgitation. Mild tricuspid regurgitation. Estimated pulmonary artery systolic pressure is 30 mmHg.   EKG 03/06/2019: Sinus rhythm 66 bpm. Normal EKG.  EKG 02/20/2019: Sinus rhythm 92 bpm. Normal EKG.   Echocardiogram 03/16/2018: Mild LVH. EF 60-65%. Grade 2 DD, elevated filling pressures. Mild LA dilatation. Trivial MR, TR. PASP 25 mmHg.  Renal US 01/18/2017: 1. No evidence of renal artery stenosis. 2. Very mild asymmetric thinning of the right renal cortex. Otherwise, normal renal ultrasound. Specifically, no evidence of urinary obstruction. 3.  Aortic Atherosclerosis (ICD10-I70.0).   Recent labs: 02/20/2019: Glucose 85, BUN/Cr 12/0.9. eGFR 59. Na/K 137/4.2. H/H 10.8/34.2. MCV 94. Platelets 160.  TSH 3.4 normal.  02/16/2019: HS trop 5   Review of Systems  Constitution: Negative for decreased appetite, malaise/fatigue, weight gain and weight loss.  HENT: Negative for congestion.   Eyes: Negative for visual disturbance.  Cardiovascular: Positive for chest pain and dyspnea on exertion. Negative for leg swelling, palpitations and syncope.  Respiratory: Positive for shortness of breath. Negative for cough.   Endocrine: Negative for cold intolerance.  Hematologic/Lymphatic: Does not bruise/bleed easily.  Skin: Negative for itching and rash.  Musculoskeletal: Negative for myalgias.  Gastrointestinal: Negative for  abdominal pain, nausea and vomiting.  Genitourinary: Negative for dysuria.  Neurological: Negative for dizziness and weakness.  Psychiatric/Behavioral: The patient is not nervous/anxious.   All other systems reviewed and are negative.        Vitals:   04/20/19 1348  BP: (!) 113/49  Pulse: 67  Temp: (!) 97.1 F (36.2 C)  SpO2: 93%     Body mass index is 28.52 kg/m. Filed Weights   04/20/19 1348  Weight: 161 lb (73 kg)     Objective:   Physical Exam  Constitutional: She is oriented to person, place, and time. She appears well-developed and well-nourished. No distress.  HENT:  Head: Normocephalic and atraumatic.  Eyes: Pupils are equal, round, and reactive to light. Conjunctivae are normal.  Neck: No JVD present.  Cardiovascular: Normal rate, regular rhythm and intact distal pulses.  Pulmonary/Chest: Effort normal and breath sounds normal. She has no wheezes. She has no rales.  Abdominal: Soft. Bowel sounds are normal. There is no rebound.  Musculoskeletal:        General: No edema.  Lymphadenopathy:    She has no cervical adenopathy.  Neurological: She is alert and oriented to person, place, and time. No cranial nerve deficit.  Skin: Skin is warm and dry.  Psychiatric: She has a normal mood and affect.  Nursing note and vitals reviewed.      Assessment & Recommendations:   84 y.o. Caucasian female with hypertension, paroxysmal atrial fibrillation, h/o stroke, with chest pressure, shortness of breath.  Chest pressure, shortness of breath: No recurrence of chest pain. Intermittent episodes of shortness of breath. Stress test only shows probably mild ischemia in basal inferoseptal myocardium. Echocardiogram shows structurally normal heart. Recommend current medical treatment. Given ongoing use of warfarin, do not recommend Aspirin. Finally, some of her symptoms of shortness of breath may be after effects of COVID. I offered her to refer to cardiopulmonary rehab, but  she wants to try to exercise on her own a this time.   Paroxysmal Afib: Currently in sinus rhythm. CHA2DS2VASc score 6, annual stroke risk 8% She has been therapeutic with warfarin for many years and would like to continue the same.   Hypertension: Controlled.  F/u in 6  months.   Nigel Mormon, MD [facility] Pager: [phone] Office: [phone] If no answer Cell [phone]

## 2019-04-20 ENCOUNTER — Ambulatory Visit: Payer: Medicare Other | Admitting: Cardiology

## 2019-04-20 ENCOUNTER — Encounter: Payer: Self-pay | Admitting: Cardiology

## 2019-04-20 ENCOUNTER — Ambulatory Visit (INDEPENDENT_AMBULATORY_CARE_PROVIDER_SITE_OTHER): Payer: Medicare Other

## 2019-04-20 ENCOUNTER — Other Ambulatory Visit: Payer: Self-pay

## 2019-04-20 VITALS — BP 113/49 | HR 67 | Temp 97.1°F | Ht 63.0 in | Wt 161.0 lb

## 2019-04-20 DIAGNOSIS — R0789 Other chest pain: Secondary | ICD-10-CM

## 2019-04-20 DIAGNOSIS — Z8673 Personal history of transient ischemic attack (TIA), and cerebral infarction without residual deficits: Secondary | ICD-10-CM | POA: Diagnosis not present

## 2019-04-20 DIAGNOSIS — R002 Palpitations: Secondary | ICD-10-CM

## 2019-04-20 DIAGNOSIS — R0602 Shortness of breath: Secondary | ICD-10-CM | POA: Diagnosis not present

## 2019-04-20 DIAGNOSIS — I480 Paroxysmal atrial fibrillation: Secondary | ICD-10-CM | POA: Diagnosis not present

## 2019-06-30 ENCOUNTER — Other Ambulatory Visit: Payer: Self-pay | Admitting: Cardiology

## 2019-06-30 DIAGNOSIS — R0789 Other chest pain: Secondary | ICD-10-CM

## 2019-07-10 ENCOUNTER — Other Ambulatory Visit: Payer: Self-pay | Admitting: Cardiology

## 2019-07-10 DIAGNOSIS — R0789 Other chest pain: Secondary | ICD-10-CM

## 2019-08-14 ENCOUNTER — Other Ambulatory Visit: Payer: Self-pay

## 2019-08-14 ENCOUNTER — Other Ambulatory Visit: Payer: Self-pay | Admitting: Internal Medicine

## 2019-08-14 ENCOUNTER — Ambulatory Visit
Admission: RE | Admit: 2019-08-14 | Discharge: 2019-08-14 | Disposition: A | Discharge status: Home / Self Care | Payer: Medicare Other | Source: Ambulatory Visit | Attending: Internal Medicine | Admitting: Internal Medicine

## 2019-08-14 DIAGNOSIS — R202 Paresthesia of skin: Secondary | ICD-10-CM

## 2019-08-14 DIAGNOSIS — R200 Anesthesia of skin: Secondary | ICD-10-CM

## 2019-08-14 MED ORDER — IOPAMIDOL (ISOVUE-370) INJECTION 76%
75.0000 mL | Freq: Once | INTRAVENOUS | Status: AC | PRN
  Administered 2019-08-14: 15:00:00 75 mL via INTRAVENOUS

## 2019-08-18 ENCOUNTER — Encounter: Payer: Self-pay | Admitting: *Deleted

## 2019-08-19 ENCOUNTER — Encounter: Payer: Self-pay | Admitting: Diagnostic Neuroimaging

## 2019-08-19 ENCOUNTER — Telehealth: Payer: Self-pay | Admitting: Diagnostic Neuroimaging

## 2019-08-19 ENCOUNTER — Ambulatory Visit: Payer: Medicare Other | Admitting: Diagnostic Neuroimaging

## 2019-08-19 ENCOUNTER — Other Ambulatory Visit: Payer: Self-pay

## 2019-08-19 ENCOUNTER — Other Ambulatory Visit: Payer: Self-pay | Admitting: Internal Medicine

## 2019-08-19 VITALS — BP 109/63 | HR 61 | Temp 97.5°F | Ht 63.5 in | Wt 160.0 lb

## 2019-08-19 DIAGNOSIS — I63432 Cerebral infarction due to embolism of left posterior cerebral artery: Secondary | ICD-10-CM

## 2019-08-19 DIAGNOSIS — I480 Paroxysmal atrial fibrillation: Secondary | ICD-10-CM

## 2019-08-19 DIAGNOSIS — E785 Hyperlipidemia, unspecified: Secondary | ICD-10-CM

## 2019-08-19 DIAGNOSIS — I10 Essential (primary) hypertension: Secondary | ICD-10-CM

## 2019-08-19 NOTE — Progress Notes (Signed)
[facility]  PATIENT: Jeanette [name] DOB: [birth year]  REFERRING CLINICIAN: Crist Infante, MD HISTORY FROM: patient  REASON FOR VISIT: new consult    HISTORICAL  CHIEF COMPLAINT:  Chief Complaint  Patient presents with  . Numbness    rm 6 New Pt "numbness started a week ago- face and left arm, arm has prickly feelings, this occured 1 week after Covid vaccine"     HISTORY OF PRESENT ILLNESS:   85 year old female here for evaluation of numbness.  History of atrial fibrillation, hypertension, hyperlipidemia.  History of stroke.  08/04/2019 patient had first dose of Covid vaccine.  On 08/12/2019 patient had onset of numbness in her left arm and left face.  Symptoms would fluctuate.  Symptoms were worse when she would stand up and improved she sat down.  Symptoms continued over several days.  Patient went to PCP for evaluation who ordered CT angiogram of the head and neck.  Patient referred here for further evaluation.  Since that time symptoms continue to fluctuate.  Currently she feels a little bit of numbness in her left tongue.  No face or arm numbness.  No weakness.   REVIEW OF SYSTEMS: Full 14 system review of systems performed and negative with exception of: As per HPI.  ALLERGIES: Allergies  Allergen Reactions  . Epinephrine Anaphylaxis    Shortness of breath and increased heart rate  . Vytorin [Ezetimibe-Simvastatin]   . Benazepril Hcl Cough  . Crestor [Rosuvastatin Calcium] Other (See Comments)    cognitive  . Lisinopril Cough    HOME MEDICATIONS: Outpatient Medications Prior to Visit  Medication Sig Dispense Refill  . ASPIRIN LOW DOSE 81 MG EC tablet TAKE 1 TABLET BY MOUTH EVERY DAY 90 tablet 1  . cholecalciferol (VITAMIN D) 1000 units tablet Take 1,000 Units by mouth daily.    . Coenzyme Q10 (COQ10 PO) Take 1 capsule by mouth daily.    Marland Kitchen escitalopram (LEXAPRO) 10 MG tablet Take 10 mg by mouth daily.      Marland Kitchen loratadine (CLARITIN) 10 MG tablet  Take 10 mg by mouth daily as needed for allergies.    . metoprolol (TOPROL-XL) 200 MG 24 hr tablet Take 100 mg by mouth 2 (two) times daily.     . nitroGLYCERIN (NITROSTAT) 0.4 MG SL tablet PLACE 1 TABLET (0.4 MG TOTAL) UNDER THE TONGUE EVERY 5 (FIVE) MINUTES AS NEEDED FOR CHEST PAIN. 75 tablet 1  . olmesartan (BENICAR) 20 MG tablet Take 10 mg by mouth daily.     . pantoprazole (PROTONIX) 20 MG tablet 20 mg daily.    . vitamin B-12 (CYANOCOBALAMIN) 100 MCG tablet Take 50 mcg by mouth daily.      Marland Kitchen warfarin (COUMADIN) 5 MG tablet Take 2.5-5 mg by mouth daily. Take 5mg  on Mon, Wed and Fri. Take 2.5mg  tablet all other days    . gabapentin (NEURONTIN) 100 MG capsule Take 100 mg by mouth once a week.   6   No facility-administered medications prior to visit.    PAST MEDICAL HISTORY: Past Medical History:  Diagnosis Date  . A-fib ([place]) 03/15/2018  . Anal cancer ([place])   . Anxiety   . Arthritis   . Breast cancer ([place])   . Breast cancer ([place])   . Cancer of breast ([place])   . Depression   . Dysrhythmia 2010   a-fib  . Hyperlipidemia   . Hypertension   . PVD (peripheral vascular disease) ([place])   . RAS (renal artery stenosis) ([place])   .  Stroke ([place]) 2019   no residual, x 2    PAST SURGICAL HISTORY: Past Surgical History:  Procedure Laterality Date  . BREAST BIOPSY Left 01/14/2003  . BREAST LUMPECTOMY Right 1989  . CARDIOVASCULAR STRESS TEST  07/27/2003   No evidence if LV myocardial ischemia or scar. LV EF 74%.  Marland Kitchen GYNECOLOGIC CRYOSURGERY    . OPEN REDUCTION INTERNAL FIXATION (ORIF) DISTAL RADIAL FRACTURE Left 02/19/2017   Procedure: OPEN REDUCTION INTERNAL FIXATION (ORIF) LEFT DISTAL RADIAL FRACTURE;  Surgeon: Leanora Cover, MD;  Location: [place];  Service: Orthopedics;  Laterality: Left;  . OVARIAN CYST REMOVAL    . RENAL DOPPLER  09/09/2012   Right proximal renal artery: 60-99% diameter reduction. Normal patency of the left main renal artery.   . TRANSTHORACIC  ECHOCARDIOGRAM  03/22/2006   EF 55-60%, mild mitral valvular regurg, pulmonary artery systolic pressure was moderately increased.  . TUBAL LIGATION      FAMILY HISTORY: Family History  Problem Relation Age of Onset  . Alzheimer's disease Mother   . Pneumonia Mother   . Heart failure Father   . Heart disease Sister   . Heart disease Brother   . Heart disease Brother   . Heart disease Sister     SOCIAL HISTORY: Social History   Socioeconomic History  . Marital status: Married    Spouse name: Richard  . Number of children: 4  . Years of education: Not on file  . Highest education level: Not on file  Occupational History  . Occupation: retired Proofreader    Comment: Clinical research associate  Tobacco Use  . Smoking status: Never Smoker  . Smokeless tobacco: Never Used  Substance and Sexual Activity  . Alcohol use: Yes    Alcohol/week: 2.0 standard drinks    Types: 2 Glasses of wine per week    Comment: rare  . Drug use: No  . Sexual activity: Yes    Partners: Male    Birth control/protection: Post-menopausal  Other Topics Concern  . Not on file  Social History Narrative   Lives with spouse   Social Determinants of Health   Financial Resource Strain:   . Difficulty of Paying Living Expenses: Not on file  Food Insecurity:   . Worried About Charity fundraiser in the Last Year: Not on file  . Ran Out of Food in the Last Year: Not on file  Transportation Needs:   . Lack of Transportation (Medical): Not on file  . Lack of Transportation (Non-Medical): Not on file  Physical Activity:   . Days of Exercise per Week: Not on file  . Minutes of Exercise per Session: Not on file  Stress:   . Feeling of Stress : Not on file  Social Connections:   . Frequency of Communication with Friends and Family: Not on file  . Frequency of Social Gatherings with Friends and Family: Not on file  . Attends Religious Services: Not on file  . Active Member of Clubs or Organizations: Not on file  . Attends  Archivist Meetings: Not on file  . Marital Status: Not on file  Intimate Partner Violence:   . Fear of Current or Ex-Partner: Not on file  . Emotionally Abused: Not on file  . Physically Abused: Not on file  . Sexually Abused: Not on file     PHYSICAL EXAM  GENERAL EXAM/CONSTITUTIONAL: Vitals:  Vitals:   08/19/19 1422  BP: 109/63  Pulse: 61  Temp: (!) 97.5 F (  36.4 C)  Weight: 160 lb (72.6 kg)  Height: 5' 3.5" (1.613 m)     Body mass index is 27.9 kg/m. Wt Readings from Last 3 Encounters:  08/19/19 160 lb (72.6 kg)  04/20/19 161 lb (73 kg)  03/06/19 162 lb (73.5 kg)     Patient is in no distress; well developed, nourished and groomed; neck is supple  CARDIOVASCULAR:  Examination of carotid arteries is normal; no carotid bruits  Regular rate and rhythm, no murmurs  Examination of peripheral vascular system by observation and palpation is normal  EYES:  Ophthalmoscopic exam of optic discs and posterior segments is normal; no papilledema or hemorrhages  No exam data present  MUSCULOSKELETAL:  Gait, strength, tone, movements noted in Neurologic exam below  NEUROLOGIC: MENTAL STATUS:  No flowsheet data found.  awake, alert, oriented to person, place and time  recent and remote memory intact  normal attention and concentration  language fluent, comprehension intact, naming intact  fund of knowledge appropriate  CRANIAL NERVE:   2nd - no papilledema on fundoscopic exam  2nd, 3rd, 4th, 6th - pupils equal and reactive to light, visual fields full to confrontation, extraocular muscles intact, no nystagmus  5th - facial sensation symmetric  7th - facial strength symmetric  8th - hearing intact  9th - palate elevates symmetrically, uvula midline  11th - shoulder shrug symmetric  12th - tongue protrusion midline  MOTOR:   normal bulk and tone, full strength in the BUE, BLE  SENSORY:   normal and symmetric to light touch,  temperature, vibration; DECR IN FEET  COORDINATION:   finger-nose-finger, fine finger movements normal  REFLEXES:   deep tendon reflexes present and symmetric  GAIT/STATION:   narrow based gait     DIAGNOSTIC DATA (LABS, IMAGING, TESTING) - I reviewed patient records, labs, notes, testing and imaging myself where available.  Lab Results  Component Value Date   WBC 3.1 (L) 02/16/2019   HGB 11.5 (L) 02/16/2019   HCT 37.3 02/16/2019   MCV 96.1 02/16/2019   PLT 147 (L) 02/16/2019      Component Value Date/Time   NA 138 02/16/2019 2054   K 4.0 02/16/2019 2054   CL 102 02/16/2019 2054   CO2 28 02/16/2019 2054   GLUCOSE 106 (H) 02/16/2019 2054   BUN 9 02/16/2019 2054   CREATININE 0.78 02/16/2019 2054   CALCIUM 10.7 (H) 02/16/2019 2054   PROT 6.3 (L) 09/09/2018 1018   ALBUMIN 3.6 09/09/2018 1018   AST 21 09/09/2018 1018   ALT 11 09/09/2018 1018   ALKPHOS 46 09/09/2018 1018   BILITOT 0.5 09/09/2018 1018   GFRNONAA >60 02/16/2019 2054   GFRAA >60 02/16/2019 2054   Lab Results  Component Value Date   CHOL 182 03/16/2018   HDL 40 (L) 03/16/2018   LDLCALC 125 (H) 03/16/2018   TRIG 85 03/16/2018   CHOLHDL 4.6 03/16/2018   Lab Results  Component Value Date   HGBA1C 5.8 (H) 03/16/2018   No results found for: VITAMINB12 No results found for: TSH   03/25/19 TTE - Left ventricle cavity is normal in size. Normal left ventricular wall  thickness. Normal LV systolic function with EF 56%. Normal global wall  motion. Doppler evidence of grade I (impaired) diastolic dysfunction,  normal LAP. Calculated EF 56%.  Trileaflet aortic valve. Trace aortic regurgitation.  Mild tricuspid regurgitation. Estimated pulmonary artery systolic pressure  is 30 mmHg.   08/14/19 CTA head / neck [I reviewed images myself and agree  with interpretation. -VRP]  - No acute intracranial hemorrhage or evidence of acute infarction. Stable chronic findings detailed above. - Similar plaque at the ICA  origins without hemodynamically significant stenosis. - New high-grade stenosis with near occlusion of the mid right P2 PCA with reconstitution. This could be responsible for an occult perforator infarct. MRI could be obtained as clinically warranted.     ASSESSMENT AND PLAN  85 y.o. year old female here with hypertension, hyperlipidemia, atrial fibrillation, stroke, here for evaluation of new onset left face and left arm numbness, fluctuating and intermittent since 08/12/2019.   Dx:  1. Cerebrovascular accident (CVA) due to embolism of left posterior cerebral artery (HCC)   2. Paroxysmal atrial fibrillation ([place])   3. Hyperlipidemia, unspecified hyperlipidemia type   4. Essential hypertension     PLAN:  LEFT FACE / TONGUE / ARM NUMBNESS (intermittent since 08/12/19) - possible TIA vs stroke - check MRI brain  STROKE PREVENTION - continue coumadin, aspirin, BP control - intolerant of statins and other lipid lowering agents; follow up with PCP and cardiology  Orders Placed This Encounter  Procedures  . MR BRAIN W WO CONTRAST   Return for pending if symptoms worsen or fail to improve, return to PCP.  I reviewed images, labs, notes, records myself. I summarized findings and reviewed with patient, for this high risk condition (TIA) requiring high complexity decision making.     Penni Bombard, MD [id number], [id number] PM Certified in Neurology, Neurophysiology and Neuroimaging  [facility] [address] [place], [place] [address] [phone]

## 2019-08-19 NOTE — Telephone Encounter (Signed)
[employer] medicare order sent to GI. No auth they will reach out to the patient to schedule.  

## 2019-08-20 ENCOUNTER — Telehealth: Payer: Self-pay | Admitting: Diagnostic Neuroimaging

## 2019-08-20 NOTE — Telephone Encounter (Signed)
Anderson Malta from Dr. Elta Guadeloupe Perini's office called wanting to know if Dr. Joylene Draft can be C.C. with the MRI of the Brain results once they come in. Please advise.

## 2019-08-20 NOTE — Telephone Encounter (Signed)
I'll fax a copy to Dr Joylene Draft when results are available.

## 2019-09-08 ENCOUNTER — Ambulatory Visit
Admission: RE | Admit: 2019-09-08 | Discharge: 2019-09-08 | Disposition: A | Discharge status: Home / Self Care | Payer: Medicare Other | Source: Ambulatory Visit | Attending: Diagnostic Neuroimaging | Admitting: Diagnostic Neuroimaging

## 2019-09-08 ENCOUNTER — Other Ambulatory Visit: Payer: Self-pay

## 2019-09-08 DIAGNOSIS — I63432 Cerebral infarction due to embolism of left posterior cerebral artery: Secondary | ICD-10-CM | POA: Diagnosis not present

## 2019-09-08 MED ORDER — GADOBENATE DIMEGLUMINE 529 MG/ML IV SOLN
14.0000 mL | Freq: Once | INTRAVENOUS | Status: AC | PRN
  Administered 2019-09-08: 11:00:00 14 mL via INTRAVENOUS

## 2019-09-10 NOTE — Telephone Encounter (Addendum)
Spoke with patient and informed her the MRI brain showed stable imaging results. there are no new findings.  Dr Leta Baptist advises to continue current plan. I will fax copy to PCP. Patient verbalized understanding, appreciation. MRI repot faxed to Dr Joylene Draft, attention [place], [phone]

## 2019-10-16 IMAGING — CT CT ANGIO HEAD
1 of 8 series · 6 of 33 positions shown · IV contrast (ISOVUE)
Comparison: Head and neck MRA 11/21/2005

CLINICAL DATA: Speech difficulty beginning 2 days ago.

EXAM:
CT ANGIOGRAPHY HEAD AND NECK
TECHNIQUE: Multidetector CT imaging of the head and neck was performed using
the standard protocol during bolus administration of intravenous
contrast. Multiplanar CT image reconstructions and MIPs were
obtained to evaluate the vascular anatomy. Carotid stenosis
measurements (when applicable) are obtained utilizing NASCET
criteria, using the distal internal carotid diameter as the
denominator.
CONTRAST:  80mL LIP61Z-D01 IOPAMIDOL (LIP61Z-D01) INJECTION 76%

[Series 7: ax thin · axial · 0.39mm/px · z∈[-306,-86]mm · 6 of 310 slices shown]
[im 45/310  soft-tissue]
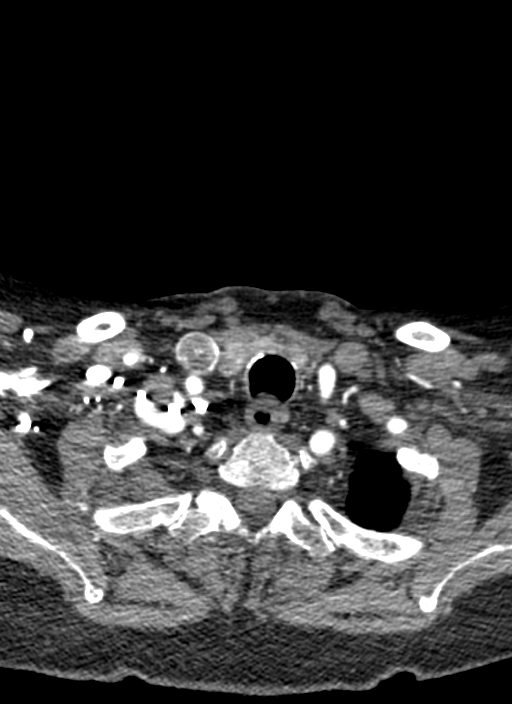
[im 89/310  bone]
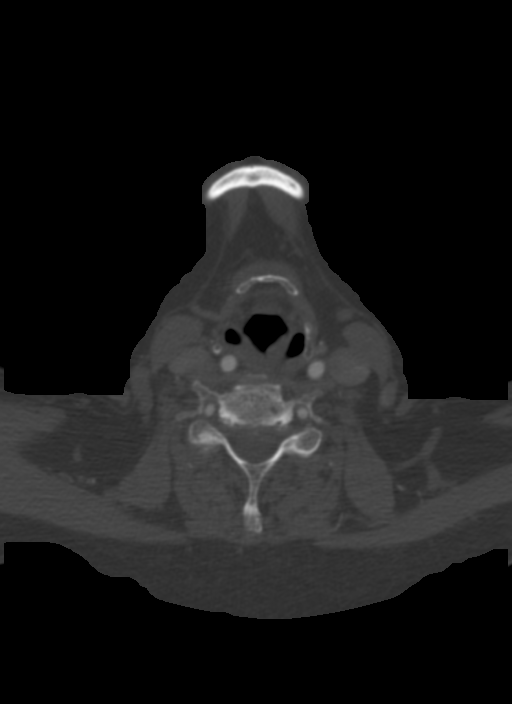
[im 133/310  soft-tissue]
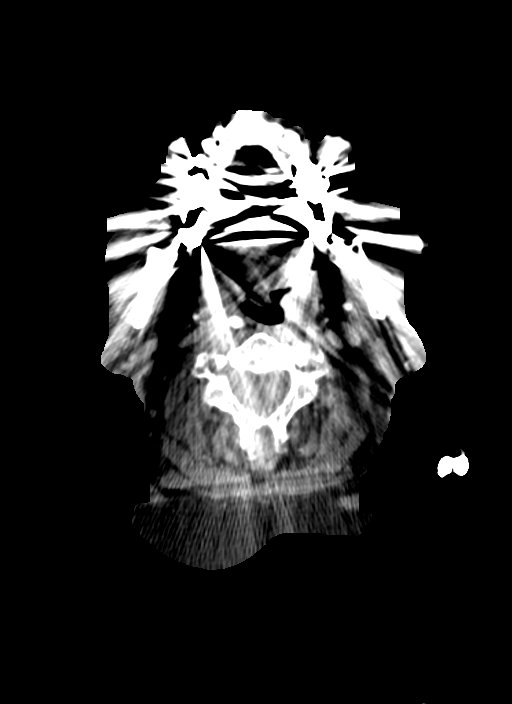
[im 177/310  bone]
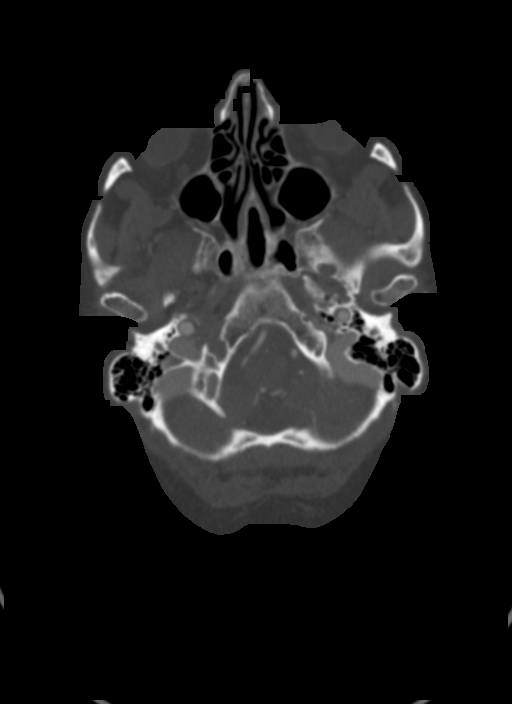
[im 221/310  soft-tissue]
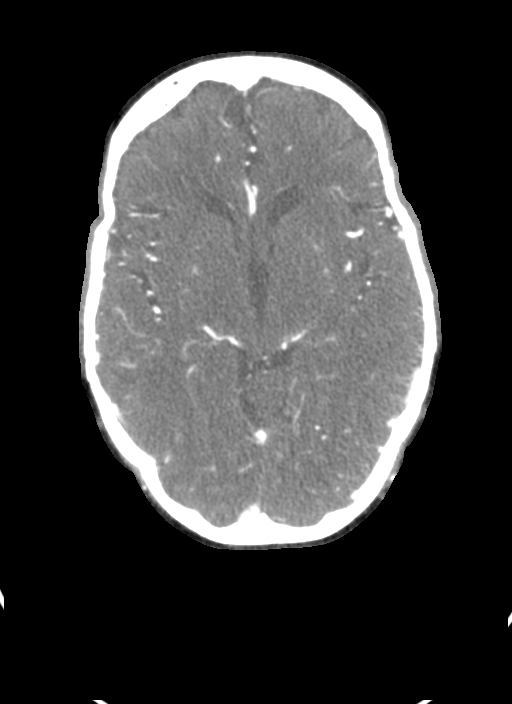
[im 265/310  bone]
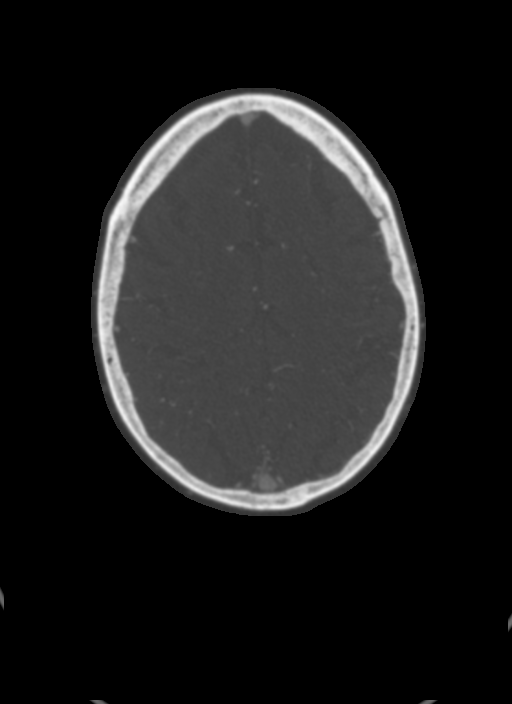

[6 of 33 positions shown; findings below may reference images not displayed]

FINDINGS: CTA NECK FINDINGS

Aortic arch: Incomplete imaging of the aortic arch including of the
brachiocephalic artery origin. Widely patent left common carotid and
left subclavian artery origins.

Right carotid system: Patent with mild calcified and soft plaque in
the carotid bulb. No evidence of significant stenosis or dissection.
Retropharyngeal course of the proximal ICA.

Left carotid system: Patent with mild calcified plaque at the
carotid bifurcation. No evidence of stenosis or dissection.

Vertebral arteries: Patent with the left vertebral artery being
mildly dominant. New moderate right vertebral artery origin
stenosis. No left vertebral artery stenosis.

Skeleton: Moderately advanced disc degeneration at C5-6 and C6-7 and
advanced left facet arthrosis at C4-5 with grade 1 anterolisthesis.

Other neck: Subcentimeter calcified right thyroid nodule.

Upper chest: Clear lung apices.

Review of the MIP images confirms the above findings

CTA HEAD FINDINGS

Anterior circulation: The internal carotid arteries are patent from
skull base to carotid termini with mild atherosclerosis and no
evidence of significant stenosis. ACAs and MCAs are patent without
evidence of proximal branch occlusion. There are severe proximal
left M1 and moderate distal right M1 stenoses which are new. A
severe left M2 superior division origin stenosis may have
progressed. There is also a moderate mid left M2 branch vessel
stenosis. No aneurysm is identified.

Posterior circulation: The intracranial vertebral arteries are
patent to the basilar with mild irregularity but no significant
stenosis. Patent PICA and SCA origins are identified bilaterally.
The left AICA is patent while the right is small and not well
evaluated. The basilar artery is widely patent. There is a small
left posterior communicating artery which is likely heavily
diseased. The PCAs are patent without evidence of flow limiting
proximal stenosis. There is a mild proximal P2 stenosis on the left.
No aneurysm is identified.

Venous sinuses: Patent.

Anatomic variants: None.

Delayed phase: No abnormal enhancement.

Review of the MIP images confirms the above findings
IMPRESSION: 1. No emergent large vessel occlusion.
2. Progressive intracranial atherosclerosis with severe left M1 and
M2 stenoses, moderate right M1 stenosis, and mild left P2 stenosis.
3. Widely patent cervical carotid arteries.
4. New moderate right vertebral artery origin stenosis.
5.  Aortic Atherosclerosis ([id number]-GT2.2).

These results were communicated to Dr. Van at [DATE] on
03/15/2018 by text page via the AMION messaging system.

## 2019-10-18 NOTE — Progress Notes (Signed)
Patient referred by Crist Infante, MD for chest pressure, shortness of breath.   Subjective:   Jeanette [name], female    DOB: 09/24/1934, 85 y.o.   MRN: [id number]   Chief Complaint  Patient presents with  . Atrial Fibrillation  . Chest Pain  . Follow-up    6 month    HPI  85 y.o. Caucasian female with hypertension, paroxysmal atrial fibrillation, h/o stroke, with chest pressure, shortness of breath.  She has not had any chest pain. Exertional dyspnea is slowly improving. Four days after her first COVID  Injection, she had an episode of left arm and facial numbness. She underwent MRI brain and saw a neurologist. She was told she did not have a stroke. And this was likely a reaction to the COVID vaccine. Fortunately, this did not recur after her second COVID vaccine.   he has not had any recurrence of chest pain. However, she notices that she has good days and bad days with her energy levels. She felt great in first week October, but has felt more tired and fatigued this week, along with shortness of breath. She wonders if some of these symptoms are residual effects from COVID that she had back in 01/2019.   Initial office visit: Patient is a retired Building services engineer. She had COVID pneumonia in July 2020, and has since recovered. Last few weeks, she has noticed episodes of chest pressure radiating to her neck, associated with shortness of breath. Episodes occur both at rest and exertion, and last for about 45 minutes. While patient is convinced that her episodes are related to Afib, there hasn't been any EKG or telemetry documentation showing Afib recently.     Family History  Problem Relation Age of Onset  . Alzheimer's disease Mother   . Pneumonia Mother   . Heart failure Father   . Heart disease Sister   . Heart disease Brother   . Heart disease Brother   . Heart disease Sister      Current Outpatient Medications on File Prior to Visit  Medication Sig Dispense Refill  .  ASPIRIN LOW DOSE 81 MG EC tablet TAKE 1 TABLET BY MOUTH EVERY DAY 90 tablet 1  . cholecalciferol (VITAMIN D) 1000 units tablet Take 1,000 Units by mouth daily.    . Coenzyme Q10 (COQ10 PO) Take 1 capsule by mouth daily.    Marland Kitchen escitalopram (LEXAPRO) 10 MG tablet Take 10 mg by mouth daily.      Marland Kitchen loratadine (CLARITIN) 10 MG tablet Take 10 mg by mouth daily as needed for allergies.    . metoprolol (TOPROL-XL) 200 MG 24 hr tablet Take 100 mg by mouth 2 (two) times daily.     . nitroGLYCERIN (NITROSTAT) 0.4 MG SL tablet PLACE 1 TABLET (0.4 MG TOTAL) UNDER THE TONGUE EVERY 5 (FIVE) MINUTES AS NEEDED FOR CHEST PAIN. 75 tablet 1  . olmesartan (BENICAR) 20 MG tablet Take 10 mg by mouth daily.     . pantoprazole (PROTONIX) 20 MG tablet 20 mg daily.    . vitamin B-12 (CYANOCOBALAMIN) 100 MCG tablet Take 50 mcg by mouth daily.      Marland Kitchen warfarin (COUMADIN) 5 MG tablet Take 2.5-5 mg by mouth daily. Take '5mg'$  on Mon, Wed and Fri. Take 2.'5mg'$  tablet all other days     No current facility-administered medications on file prior to visit.    Cardiovascular studies:  MRI brain 09/08/2019: MRI brain with and without demonstrating: -Chronic left parietal ischemic infarction;  smaller chronic left occipital ischemic infarction. -No new or acute findings.  Lexiscan Myoview stress test 04/20/2019: Lexiscan stress test was performed. Stress EKG is non-diagnostic, as this is pharmacological stress test. In addition, rest and stress EKG showed sinus rhythm/tachycardia, occasional PAC's, no ST-T abnormality.  SPECT stress and rest images demonstrate a small size, mild intensity perfusion defect in basal inferoseptal myocardium with minimal reversibility. Normal wall thickening. Stress LVEF 54%. Low risk study.   Event monitor 03/06/2019 - 04/04/2019: Dominant rhythm sinus. HR 52-111 bpm. Two auto triggered events-one showing 5 beat NSVT Several patient triggered episodes showing PAC/PVC's correlate with patients symptoms of  chest pain, shortness of breath, skipped beats, fatigue.  Echocardiogram 03/25/2019: Left ventricle cavity is normal in size. Normal left ventricular wall thickness. Normal LV systolic function with EF 56%. Normal global wall motion. Doppler evidence of grade I (impaired) diastolic dysfunction, normal LAP. Calculated EF 56%. Trileaflet aortic valve. Trace aortic regurgitation. Mild tricuspid regurgitation. Estimated pulmonary artery systolic pressure is 30 mmHg.   EKG 03/06/2019: Sinus rhythm 66 bpm. Normal EKG.  EKG 02/20/2019: Sinus rhythm 92 bpm. Normal EKG.   Echocardiogram 03/16/2018: Mild LVH. EF 60-65%. Grade 2 DD, elevated filling pressures. Mild LA dilatation. Trivial MR, TR. PASP 25 mmHg.  Renal US 01/18/2017: 1. No evidence of renal artery stenosis. 2. Very mild asymmetric thinning of the right renal cortex. Otherwise, normal renal ultrasound. Specifically, no evidence of urinary obstruction. 3.  Aortic Atherosclerosis (ICD10-I70.0).   Recent labs: 05/15/2019: Glucose 77, BUN/Cr 10/0.8. EGFR 68 HbA1C 5.4% Chol 206, TG 138, HDL 43, LDL 135 TSH 2.4normal   02/20/2019: Glucose 85, BUN/Cr 12/0.9. eGFR 59. Na/K 137/4.2. H/H 10.8/34.2. MCV 94. Platelets 160.  TSH 3.4 normal.  02/16/2019: HS trop 5   Review of Systems  Cardiovascular: Negative for chest pain, dyspnea on exertion, leg swelling, palpitations and syncope.         Vitals:   10/19/19 1142  BP: 122/67  Pulse: 67  Resp: 17  Temp: 97.7 F (36.5 C)  SpO2: 98%     Body mass index is 28.87 kg/m. Filed Weights   10/19/19 1142  Weight: 163 lb (73.9 kg)     Objective:   Physical Exam  Constitutional: She appears well-developed and well-nourished.  Neck: No JVD present.  Cardiovascular: Normal rate, regular rhythm, normal heart sounds and intact distal pulses.  No murmur heard. Pulmonary/Chest: Effort normal and breath sounds normal. She has no wheezes. She has no rales.  Musculoskeletal:         General: No edema.  Nursing note and vitals reviewed.      Assessment & Recommendations:   85 y.o. Caucasian female with hypertension, paroxysmal atrial fibrillation, h/o stroke, with chest pressure, shortness of breath.  Chest pressure, shortness of breath: No recurrence of chest pain. Intermittent episodes of shortness of breath. Stress test only shows probably mild ischemia in basal inferoseptal myocardium. Echocardiogram shows structurally normal heart. Recommend current medical treatment. Given ongoing use of warfarin, do not recommend Aspirin. Finally, some of her symptoms of shortness of breath may be after effects of COVID.   Paroxysmal Afib: Currently in sinus rhythm. CHA2DS2VASc score 6, annual stroke risk 8% She has been therapeutic with warfarin for many years and would like to continue the same.   Hypertension: Controlled.  Hyperlipidemia: Statin intolerant. Agree with Repatha. Dr. Silvestre Mesi office is arranging this.   F/u in 6 months.   Nigel Mormon, MD [facility] Pager: [phone] Office: [phone] If  no answer Cell [phone]

## 2019-10-19 ENCOUNTER — Other Ambulatory Visit: Payer: Self-pay

## 2019-10-19 ENCOUNTER — Encounter: Payer: Self-pay | Admitting: Cardiology

## 2019-10-19 ENCOUNTER — Ambulatory Visit: Payer: Medicare Other | Admitting: Cardiology

## 2019-10-19 VITALS — BP 122/67 | HR 67 | Temp 97.7°F | Resp 17 | Ht 63.0 in | Wt 163.0 lb

## 2019-10-19 DIAGNOSIS — E782 Mixed hyperlipidemia: Secondary | ICD-10-CM

## 2019-10-19 DIAGNOSIS — I480 Paroxysmal atrial fibrillation: Secondary | ICD-10-CM

## 2019-10-19 DIAGNOSIS — R002 Palpitations: Secondary | ICD-10-CM

## 2019-10-19 DIAGNOSIS — Z8673 Personal history of transient ischemic attack (TIA), and cerebral infarction without residual deficits: Secondary | ICD-10-CM

## 2019-12-04 ENCOUNTER — Inpatient Hospital Stay (HOSPITAL_COMMUNITY)
Admission: EM | Admit: 2019-12-04 | Discharge: 2019-12-06 | DRG: 024 | Disposition: A | Discharge status: Home Health | Payer: Medicare Other | Attending: Neurology | Admitting: Neurology

## 2019-12-04 ENCOUNTER — Observation Stay (HOSPITAL_COMMUNITY): Payer: Medicare Other

## 2019-12-04 ENCOUNTER — Emergency Department (HOSPITAL_COMMUNITY): Payer: Medicare Other

## 2019-12-04 ENCOUNTER — Encounter (HOSPITAL_COMMUNITY)
Admission: EM | Disposition: A | Discharge status: Home Health | Payer: Self-pay | Source: Home / Self Care | Attending: Neurology

## 2019-12-04 ENCOUNTER — Observation Stay (HOSPITAL_COMMUNITY): Payer: Medicare Other | Admitting: Anesthesiology

## 2019-12-04 ENCOUNTER — Encounter (HOSPITAL_COMMUNITY): Payer: Self-pay | Admitting: Emergency Medicine

## 2019-12-04 ENCOUNTER — Other Ambulatory Visit: Payer: Self-pay

## 2019-12-04 DIAGNOSIS — I110 Hypertensive heart disease with heart failure: Secondary | ICD-10-CM | POA: Diagnosis present

## 2019-12-04 DIAGNOSIS — I63219 Cerebral infarction due to unspecified occlusion or stenosis of unspecified vertebral arteries: Secondary | ICD-10-CM | POA: Diagnosis present

## 2019-12-04 DIAGNOSIS — F329 Major depressive disorder, single episode, unspecified: Secondary | ICD-10-CM | POA: Diagnosis present

## 2019-12-04 DIAGNOSIS — I63531 Cerebral infarction due to unspecified occlusion or stenosis of right posterior cerebral artery: Secondary | ICD-10-CM | POA: Diagnosis present

## 2019-12-04 DIAGNOSIS — I480 Paroxysmal atrial fibrillation: Secondary | ICD-10-CM

## 2019-12-04 DIAGNOSIS — Z85528 Personal history of other malignant neoplasm of kidney: Secondary | ICD-10-CM | POA: Diagnosis not present

## 2019-12-04 DIAGNOSIS — F419 Anxiety disorder, unspecified: Secondary | ICD-10-CM | POA: Diagnosis present

## 2019-12-04 DIAGNOSIS — I10 Essential (primary) hypertension: Secondary | ICD-10-CM

## 2019-12-04 DIAGNOSIS — I6340 Cerebral infarction due to embolism of unspecified cerebral artery: Secondary | ICD-10-CM | POA: Diagnosis present

## 2019-12-04 DIAGNOSIS — Z8673 Personal history of transient ischemic attack (TIA), and cerebral infarction without residual deficits: Secondary | ICD-10-CM | POA: Diagnosis not present

## 2019-12-04 DIAGNOSIS — Z8249 Family history of ischemic heart disease and other diseases of the circulatory system: Secondary | ICD-10-CM | POA: Diagnosis not present

## 2019-12-04 DIAGNOSIS — I639 Cerebral infarction, unspecified: Secondary | ICD-10-CM | POA: Diagnosis present

## 2019-12-04 DIAGNOSIS — E782 Mixed hyperlipidemia: Secondary | ICD-10-CM | POA: Diagnosis present

## 2019-12-04 DIAGNOSIS — Z6829 Body mass index (BMI) 29.0-29.9, adult: Secondary | ICD-10-CM

## 2019-12-04 DIAGNOSIS — Z853 Personal history of malignant neoplasm of breast: Secondary | ICD-10-CM | POA: Diagnosis not present

## 2019-12-04 DIAGNOSIS — M1990 Unspecified osteoarthritis, unspecified site: Secondary | ICD-10-CM | POA: Diagnosis present

## 2019-12-04 DIAGNOSIS — J45909 Unspecified asthma, uncomplicated: Secondary | ICD-10-CM | POA: Diagnosis present

## 2019-12-04 DIAGNOSIS — H5340 Unspecified visual field defects: Secondary | ICD-10-CM | POA: Diagnosis present

## 2019-12-04 DIAGNOSIS — E669 Obesity, unspecified: Secondary | ICD-10-CM | POA: Diagnosis present

## 2019-12-04 DIAGNOSIS — Z20822 Contact with and (suspected) exposure to covid-19: Secondary | ICD-10-CM | POA: Diagnosis present

## 2019-12-04 DIAGNOSIS — I5032 Chronic diastolic (congestive) heart failure: Secondary | ICD-10-CM | POA: Diagnosis present

## 2019-12-04 DIAGNOSIS — R739 Hyperglycemia, unspecified: Secondary | ICD-10-CM | POA: Diagnosis present

## 2019-12-04 DIAGNOSIS — I6782 Cerebral ischemia: Secondary | ICD-10-CM | POA: Diagnosis present

## 2019-12-04 DIAGNOSIS — G43909 Migraine, unspecified, not intractable, without status migrainosus: Secondary | ICD-10-CM | POA: Diagnosis present

## 2019-12-04 DIAGNOSIS — I6389 Other cerebral infarction: Secondary | ICD-10-CM | POA: Diagnosis not present

## 2019-12-04 DIAGNOSIS — Z820 Family history of epilepsy and other diseases of the nervous system: Secondary | ICD-10-CM

## 2019-12-04 DIAGNOSIS — Z7982 Long term (current) use of aspirin: Secondary | ICD-10-CM

## 2019-12-04 DIAGNOSIS — Z85048 Personal history of other malignant neoplasm of rectum, rectosigmoid junction, and anus: Secondary | ICD-10-CM | POA: Diagnosis not present

## 2019-12-04 DIAGNOSIS — H5347 Heteronymous bilateral field defects: Secondary | ICD-10-CM | POA: Diagnosis present

## 2019-12-04 DIAGNOSIS — R202 Paresthesia of skin: Secondary | ICD-10-CM | POA: Diagnosis present

## 2019-12-04 DIAGNOSIS — G459 Transient cerebral ischemic attack, unspecified: Secondary | ICD-10-CM

## 2019-12-04 DIAGNOSIS — Z7901 Long term (current) use of anticoagulants: Secondary | ICD-10-CM

## 2019-12-04 DIAGNOSIS — R29702 NIHSS score 2: Secondary | ICD-10-CM | POA: Diagnosis present

## 2019-12-04 DIAGNOSIS — Z79899 Other long term (current) drug therapy: Secondary | ICD-10-CM

## 2019-12-04 DIAGNOSIS — I6501 Occlusion and stenosis of right vertebral artery: Secondary | ICD-10-CM | POA: Diagnosis present

## 2019-12-04 DIAGNOSIS — R4781 Slurred speech: Secondary | ICD-10-CM | POA: Diagnosis present

## 2019-12-04 HISTORY — DX: Cerebral infarction, unspecified: I63.9

## 2019-12-04 HISTORY — PX: IR CT HEAD LTD: IMG2386

## 2019-12-04 HISTORY — PX: IR PERCUTANEOUS ART THROMBECTOMY/INFUSION INTRACRANIAL INC DIAG ANGIO: IMG6087

## 2019-12-04 HISTORY — PX: IR US GUIDE VASC ACCESS LEFT: IMG2389

## 2019-12-04 HISTORY — PX: RADIOLOGY WITH ANESTHESIA: SHX6223

## 2019-12-04 LAB — COMPREHENSIVE METABOLIC PANEL
ALT: 14 U/L (ref 0–44)
AST: 21 U/L (ref 15–41)
Albumin: 3.7 g/dL (ref 3.5–5.0)
Alkaline Phosphatase: 56 U/L (ref 38–126)
Anion gap: 8 (ref 5–15)
BUN: 11 mg/dL (ref 8–23)
CO2: 26 mmol/L (ref 22–32)
Calcium: 10.4 mg/dL — ABNORMAL HIGH (ref 8.9–10.3)
Chloride: 102 mmol/L (ref 98–111)
Creatinine, Ser: 0.83 mg/dL (ref 0.44–1.00)
GFR calc Af Amer: >60 mL/min (ref >60)
GFR calc non Af Amer: >60 mL/min (ref >60)
Glucose, Bld: 130 mg/dL — ABNORMAL HIGH (ref 70–99)
Potassium: 3.8 mmol/L (ref 3.5–5.1)
Sodium: 136 mmol/L (ref 135–145)
Total Bilirubin: 0.6 mg/dL (ref 0.3–1.2)
Total Protein: 6.7 g/dL (ref 6.5–8.1)

## 2019-12-04 LAB — CBC
HCT: 37 % (ref 36.0–46.0)
Hemoglobin: 11.6 g/dL — ABNORMAL LOW (ref 12.0–15.0)
MCH: 29.7 pg (ref 26.0–34.0)
MCHC: 31.4 g/dL (ref 30.0–36.0)
MCV: 94.9 fL (ref 80.0–100.0)
Platelets: 220 10*3/uL (ref 150–400)
RBC: 3.9 MIL/uL (ref 3.87–5.11)
RDW: 15.7 % — ABNORMAL HIGH (ref 11.5–15.5)
WBC: 4.7 10*3/uL (ref 4.0–10.5)
nRBC: 0 % (ref 0.0–0.2)

## 2019-12-04 LAB — I-STAT CHEM 8, ED
BUN: 12 mg/dL (ref 8–23)
Calcium, Ion: 1.45 mmol/L — ABNORMAL HIGH (ref 1.15–1.40)
Chloride: 100 mmol/L (ref 98–111)
Creatinine, Ser: 0.7 mg/dL (ref 0.44–1.00)
Glucose, Bld: 129 mg/dL — ABNORMAL HIGH (ref 70–99)
HCT: 37 % (ref 36.0–46.0)
Hemoglobin: 12.6 g/dL (ref 12.0–15.0)
Potassium: 4 mmol/L (ref 3.5–5.1)
Sodium: 139 mmol/L (ref 135–145)
TCO2: 27 mmol/L (ref 22–32)

## 2019-12-04 LAB — DIFFERENTIAL
Abs Immature Granulocytes: 0.06 10*3/uL (ref 0.00–0.07)
Basophils Absolute: 0 10*3/uL (ref 0.0–0.1)
Basophils Relative: 0 %
Eosinophils Absolute: 0.1 10*3/uL (ref 0.0–0.5)
Eosinophils Relative: 1 %
Immature Granulocytes: 1 %
Lymphocytes Relative: 20 %
Lymphs Abs: 0.9 10*3/uL (ref 0.7–4.0)
Monocytes Absolute: 0.7 10*3/uL (ref 0.1–1.0)
Monocytes Relative: 14 %
Neutro Abs: 3 10*3/uL (ref 1.7–7.7)
Neutrophils Relative %: 64 %

## 2019-12-04 LAB — APTT: aPTT: 36 seconds (ref 24–36)

## 2019-12-04 LAB — PROTIME-INR
INR: 1.5 — ABNORMAL HIGH (ref 0.8–1.2)
Prothrombin Time: 17.9 seconds — ABNORMAL HIGH (ref 11.4–15.2)

## 2019-12-04 LAB — SARS CORONAVIRUS 2 BY RT PCR (HOSPITAL ORDER, PERFORMED IN ~~LOC~~ HOSPITAL LAB): SARS Coronavirus 2: NEGATIVE

## 2019-12-04 LAB — ETHANOL: Alcohol, Ethyl (B): <10 mg/dL (ref <10)

## 2019-12-04 SURGERY — IR WITH ANESTHESIA
Anesthesia: General

## 2019-12-04 MED ORDER — VERAPAMIL HCL 2.5 MG/ML IV SOLN
INTRAVENOUS | Status: AC
  Filled 2019-12-04: qty 2

## 2019-12-04 MED ORDER — SUCCINYLCHOLINE CHLORIDE 20 MG/ML IJ SOLN
INTRAMUSCULAR | Status: DC | PRN
  Administered 2019-12-04: 100 mg via INTRAVENOUS

## 2019-12-04 MED ORDER — HYDROCODONE-ACETAMINOPHEN 5-325 MG PO TABS
0.5000 | ORAL_TABLET | Freq: Once | ORAL | Status: AC
  Administered 2019-12-04: 0.5 via ORAL
  Filled 2019-12-04: qty 1

## 2019-12-04 MED ORDER — TIROFIBAN HCL IN NACL 5-0.9 MG/100ML-% IV SOLN
INTRAVENOUS | Status: AC
  Filled 2019-12-04: qty 100

## 2019-12-04 MED ORDER — ROCURONIUM BROMIDE 100 MG/10ML IV SOLN
INTRAVENOUS | Status: DC | PRN
  Administered 2019-12-04: 50 mg via INTRAVENOUS

## 2019-12-04 MED ORDER — EPHEDRINE SULFATE 50 MG/ML IJ SOLN
INTRAMUSCULAR | Status: DC | PRN
  Administered 2019-12-04 (×2): 5 mg via INTRAVENOUS
  Administered 2019-12-04: 10 mg via INTRAVENOUS

## 2019-12-04 MED ORDER — NITROGLYCERIN 1 MG/10 ML FOR IR/CATH LAB
INTRA_ARTERIAL | Status: AC
  Administered 2019-12-04: 500 ug via INTRA_ARTERIAL
  Filled 2019-12-04: qty 10

## 2019-12-04 MED ORDER — LACTATED RINGERS IV SOLN: INTRAVENOUS | Status: DC | PRN

## 2019-12-04 MED ORDER — FENTANYL CITRATE (PF) 100 MCG/2ML IJ SOLN
INTRAMUSCULAR | Status: AC
  Filled 2019-12-04: qty 2

## 2019-12-04 MED ORDER — ONDANSETRON HCL 4 MG/2ML IJ SOLN
INTRAMUSCULAR | Status: DC | PRN
  Administered 2019-12-04: 4 mg via INTRAVENOUS

## 2019-12-04 MED ORDER — TICAGRELOR 90 MG PO TABS
ORAL_TABLET | ORAL | Status: AC
  Filled 2019-12-04: qty 2

## 2019-12-04 MED ORDER — EPTIFIBATIDE 20 MG/10ML IV SOLN
INTRAVENOUS | Status: AC
  Filled 2019-12-04: qty 10

## 2019-12-04 MED ORDER — CLOPIDOGREL BISULFATE 300 MG PO TABS
ORAL_TABLET | ORAL | Status: AC
  Filled 2019-12-04: qty 1

## 2019-12-04 MED ORDER — SUGAMMADEX SODIUM 200 MG/2ML IV SOLN
INTRAVENOUS | Status: DC | PRN
  Administered 2019-12-04: 200 mg via INTRAVENOUS

## 2019-12-04 MED ORDER — PROPOFOL 10 MG/ML IV BOLUS
INTRAVENOUS | Status: DC | PRN
  Administered 2019-12-04: 120 mg via INTRAVENOUS

## 2019-12-04 MED ORDER — LIDOCAINE HCL (CARDIAC) PF 100 MG/5ML IV SOSY
PREFILLED_SYRINGE | INTRAVENOUS | Status: DC | PRN
  Administered 2019-12-04: 40 mg via INTRATRACHEAL

## 2019-12-04 MED ORDER — ASPIRIN 81 MG PO CHEW
CHEWABLE_TABLET | ORAL | Status: AC
  Filled 2019-12-04: qty 1

## 2019-12-04 MED ORDER — PHENYLEPHRINE HCL-NACL 10-0.9 MG/250ML-% IV SOLN
INTRAVENOUS | Status: DC | PRN
  Administered 2019-12-04: 40 ug/min via INTRAVENOUS

## 2019-12-04 MED ORDER — IOHEXOL 240 MG/ML SOLN
INTRAMUSCULAR | Status: AC
  Administered 2019-12-05: 30 mL via INTRA_ARTERIAL
  Filled 2019-12-04: qty 200

## 2019-12-04 MED ORDER — FENTANYL CITRATE (PF) 250 MCG/5ML IJ SOLN
INTRAMUSCULAR | Status: DC | PRN
  Administered 2019-12-04 (×2): 50 ug via INTRAVENOUS

## 2019-12-04 MED ORDER — IOHEXOL 350 MG/ML SOLN
75.0000 mL | Freq: Once | INTRAVENOUS | Status: AC | PRN
  Administered 2019-12-04: 75 mL via INTRAVENOUS

## 2019-12-04 MED ORDER — IOHEXOL 350 MG/ML SOLN
40.0000 mL | Freq: Once | INTRAVENOUS | Status: AC | PRN
  Administered 2019-12-04: 40 mL via INTRAVENOUS

## 2019-12-04 MED ORDER — HYDRALAZINE HCL 20 MG/ML IJ SOLN
10.0000 mg | Freq: Four times a day (QID) | INTRAMUSCULAR | Status: DC | PRN
  Administered 2019-12-05: 10 mg via INTRAVENOUS
  Filled 2019-12-04: qty 1

## 2019-12-04 MED ORDER — PHENYLEPHRINE HCL (PRESSORS) 10 MG/ML IV SOLN
INTRAVENOUS | Status: DC | PRN
  Administered 2019-12-04: 40 ug via INTRAVENOUS

## 2019-12-04 NOTE — ED Notes (Signed)
Pt reports that her headache is better

## 2019-12-04 NOTE — Anesthesia Procedure Notes (Signed)
Procedure Name: Intubation Date/Time: 12/04/2019 11:21 PM Performed by: Clovis Cao, CRNA Pre-anesthesia Checklist: Patient identified, Emergency Drugs available, Suction available, Patient being monitored and Timeout performed Patient Re-evaluated:Patient Re-evaluated prior to induction Oxygen Delivery Method: Circle system utilized Preoxygenation: Pre-oxygenation with 100% oxygen Induction Type: IV induction, Cricoid Pressure applied and Rapid sequence Laryngoscope Size: Glidescope (Elective Glidescope use) Grade View: Grade I Tube type: Oral Tube size: 8.0 mm Number of attempts: 1 Airway Equipment and Method: Stylet and Video-laryngoscopy Placement Confirmation: ETT inserted through vocal cords under direct vision,  positive ETCO2 and breath sounds checked- equal and bilateral Secured at: 21 cm Tube secured with: Tape Dental Injury: Teeth and Oropharynx as per pre-operative assessment

## 2019-12-04 NOTE — Consult Note (Addendum)
Referring Physician: Athena Masse, [place]    Reason for Consult: subacute stroke  HPI: Jeanette [name] is an 85 y.o. 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 She is on Coumadin for Afib and reports that recently her INR was elevated and she held coumadin, however today it is 1.5. She reports that after having breakfast yesterday am, she had sudden onset of left arm 'heaviness', tongue felt numb and she became mentally cloudy. She took a nap thinking that would help, but it did not. When symptoms persisted today, she sough medical attention.   Date last known well: 12/04/19 Time last known well: 10:30am  tPA Given: no, outside of time window  Past Medical History Past Medical History:  Diagnosis Date  . A-fib ([place]) 03/15/2018  . Anal cancer ([place])   . Anxiety   . Arthritis   . Breast cancer ([place])   . Breast cancer ([place])   . Cancer of breast ([place])   . Depression   . Dysrhythmia 2010   a-fib  . Hyperlipidemia   . Hypertension   . PVD (peripheral vascular disease) ([place])   . RAS (renal artery stenosis) ([place])   . Stroke ([place]) 2019   no residual, x 2    Surgical History Past Surgical History:  Procedure Laterality Date  . BREAST BIOPSY Left 01/14/2003  . BREAST LUMPECTOMY Right 1989  . CARDIOVASCULAR STRESS TEST  07/27/2003   No evidence if LV myocardial ischemia or scar. LV EF 74%.  [facility] GYNECOLOGIC CRYOSURGERY    . OPEN REDUCTION INTERNAL FIXATION (ORIF) DISTAL RADIAL FRACTURE Left 02/19/2017   Procedure: OPEN REDUCTION INTERNAL FIXATION (ORIF) LEFT DISTAL RADIAL FRACTURE;  Surgeon: Leanora Cover, MD;  Location: [facility];  Service: Orthopedics;  Laterality: Left;  . OVARIAN CYST REMOVAL    . RENAL DOPPLER  09/09/2012   Right proximal renal artery: 60-99% diameter reduction. Normal patency of the left main renal artery.   . TRANSTHORACIC ECHOCARDIOGRAM  03/22/2006   EF 55-60%, mild  mitral valvular regurg, pulmonary artery systolic pressure was moderately increased.  . TUBAL LIGATION      Family History  Family History  Problem Relation Age of Onset  . Alzheimer's disease Mother   . Pneumonia Mother   . Heart failure Father   . Heart disease Sister   . Heart disease Brother   . Heart disease Brother   . Heart disease Sister   . Pneumonia Sister     Social History:   reports that she has never smoked. She has never used smokeless tobacco. She reports current alcohol use of about 2.0 standard drinks of alcohol per week. She reports that she does not use drugs.  Allergies:  Allergies  Allergen Reactions  . Epinephrine Anaphylaxis    Shortness of breath and increased heart rate  . Vytorin [Ezetimibe-Simvastatin]   . Benazepril Hcl Cough  . Crestor [Rosuvastatin Calcium] Other (See Comments)    cognitive  . Lisinopril Cough    Home Medications:  (Not in a hospital admission)   Hospital Medications . HYDROcodone-acetaminophen  0.5 tablet Oral Once    ROS:  History obtained from pt  General ROS: negative for - chills, fatigue, fever, night sweats, weight gain or weight loss Psychological ROS: negative for - behavioral disorder, hallucinations, memory difficulties, mood swings or suicidal ideation Ophthalmic ROS: negative for - blurry vision, double vision, eye pain or  loss of vision ENT ROS: negative for - epistaxis, nasal discharge, oral lesions, sore throat, tinnitus or vertigo Allergy and Immunology ROS: negative for - hives or itchy/watery eyes Hematological and Lymphatic ROS: negative for - bleeding problems, bruising or swollen lymph nodes Endocrine ROS: negative for - galactorrhea, hair pattern changes, polydipsia/polyuria or temperature intolerance Respiratory ROS: negative for - cough, hemoptysis, shortness of breath or wheezing Cardiovascular ROS: negative for - chest pain, dyspnea on exertion, edema or irregular heartbeat Gastrointestinal  ROS: negative for - abdominal pain, diarrhea, hematemesis, nausea/vomiting or stool incontinence Genito-Urinary ROS: negative for - dysuria, hematuria, incontinence or urinary frequency/urgency Musculoskeletal ROS: negative for - joint swelling or muscular weakness Neurological ROS: as noted in HPI Dermatological ROS: negative for rash and skin lesion changes   Physical Examination:  Vitals:   12/04/19 1206  BP: (!) 168/74  Pulse: 69  Resp: 16  Temp: 98 F (36.7 C)  TempSrc: Oral  SpO2: 97%   General: Appears well-developed, no distress Psych: Affect appropriate to situation Eyes: No scleral injection HENT: No OP obstrucion Head: Normocephalic.  Cardiovascular: Normal rate and regular rhythm. Respiratory: Effort normal and breath sounds normal to anterior ascultation GI: Soft.  No distension. There is no tenderness.  Skin: WDI    Neurological Examination Mental Status: Alert, oriented, thought content appropriate.  Speech fluent without evidence of aphasia. Able to follow 3 step commands without difficulty. Describes entire tongue as numb. Cranial Nerves: II: Visual fields grossly normal,  III,IV, VI: ptosis not present, extra-ocular motions intact bilaterally, pupils equal, round, reactive to light and accommodation V,VII: smile symmetric, facial light touch sensation normal bilaterally VIII: hearing normal bilaterally IX,X: uvula rises symmetrically XI: bilateral shoulder shrug XII: midline tongue extension Motor: Right : Upper extremity   5/5    Left:     Upper extremity   5/5 girp, but slight pronator drift noted.  Lower extremity   5/5     Lower extremity   5/5 Tone and bulk:normal tone throughout; no atrophy noted Sensory: describes "heavy" sensation in left arm, but not clearly numb Deep Tendon Reflexes: 2+ and symmetric throughout Plantars: Right: downgoing   Left: downgoing Cerebellar: normal finger-to-nose, normal rapid alternating movements and normal  heel-to-shin test Gait: normal gait and station NIHSS 1a Level of Conscious.: 0 1b LOC Questions: 0 1c LOC Commands: 0 2 Best Gaze: 0 3 Visual: 0 4 Facial Palsy: 0 5a Motor Arm - Left:1 5b Motor Arm - Right: 0 6a Motor Leg - Left: 0 6b Motor Leg - Right: 0 7 Limb Ataxia: 0 8 Sensory: 1 9 Best Language: 0 10 Dysarthria: 0 11 Extinct. and Inatten.: 0 TOTAL: 2   LABORATORY STUDIES:  Basic Metabolic Panel: Recent Labs  Lab 12/04/19 1228 12/04/19 1236  NA 136 139  K 3.8 4.0  CL 102 100  CO2 26  --   GLUCOSE 130* 129*  BUN 11 12  CREATININE 0.83 0.70  CALCIUM 10.4*  --     Liver Function Tests: Recent Labs  Lab 12/04/19 1228  AST 21  ALT 14  ALKPHOS 56  BILITOT 0.6  PROT 6.7  ALBUMIN 3.7   No results for input(s): LIPASE, AMYLASE in the last 168 hours. No results for input(s): AMMONIA in the last 168 hours.  CBC: Recent Labs  Lab 12/04/19 1228 12/04/19 1236  WBC 4.7  --   NEUTROABS 3.0  --   HGB 11.6* 12.6  HCT 37.0 37.0  MCV 94.9  --  PLT 220  --     Cardiac Enzymes: No results for input(s): CKTOTAL, CKMB, CKMBINDEX, TROPONINI in the last 168 hours.  BNP: Invalid input(s): POCBNP  CBG: No results for input(s): GLUCAP in the last 168 hours.  Microbiology:   Coagulation Studies: Recent Labs    12/04/19 1228  LABPROT 17.9*  INR 1.5*    Urinalysis: No results for input(s): COLORURINE, LABSPEC, PHURINE, GLUCOSEU, HGBUR, BILIRUBINUR, KETONESUR, PROTEINUR, UROBILINOGEN, NITRITE, LEUKOCYTESUR in the last 168 hours.  Invalid input(s): APPERANCEUR  Lipid Panel:     Component Value Date/Time   CHOL 182 03/16/2018 0332   TRIG 85 03/16/2018 0332   HDL 40 (L) 03/16/2018 0332   CHOLHDL 4.6 03/16/2018 0332   VLDL 17 03/16/2018 0332   LDLCALC 125 (H) 03/16/2018 0332    HgbA1C:  Lab Results  Component Value Date   HGBA1C 5.8 (H) 03/16/2018    Urine Drug Screen:      Component Value Date/Time   LABOPIA POSITIVE (A) 03/15/2018 2052    COCAINSCRNUR NONE DETECTED 03/15/2018 2052   LABBENZ NONE DETECTED 03/15/2018 2052   AMPHETMU NONE DETECTED 03/15/2018 2052   THCU NONE DETECTED 03/15/2018 2052   LABBARB NONE DETECTED 03/15/2018 2052     Alcohol Level:  Recent Labs  Lab 12/04/19 1228  ETH <10    Miscellaneous labs:  ECG - SR rate   BPM. (See cardiology reading for complete details)   IMAGING: CT HEAD WO CONTRAST  Result Date: 12/04/2019 CLINICAL DATA:  Numbness tingling EXAM: CT HEAD WITHOUT CONTRAST TECHNIQUE: Contiguous axial images were obtained from the base of the skull through the vertex without intravenous contrast. COMPARISON:  MRI 09/08/2019, CT brain 09/09/2018 FINDINGS: Brain: No acute territorial infarction, hemorrhage or intracranial mass. Chronic left parietal infarct. Atrophy. Mild chronic small vessel ischemic change of the white matter. Stable ventricle size. Vascular: No hyperdense vessels.  Carotid vascular calcification Skull: Normal. Negative for fracture or focal lesion. Sinuses/Orbits: Mucosal thickening within the left maxillary sinus. Other: None IMPRESSION: 1. No CT evidence for acute intracranial abnormality. 2. Atrophy and chronic small vessel ischemic change of the white matter. Chronic left parietal infarct. Electronically Signed   By: Donavan Foil M.D.   On: 12/04/2019 17:19   Assessment:  Jeanette [name] is a 85 y.o. 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 She is on Coumadin for Afib and reports that recently her INR was elevated and she held coumadin, however today it is 1.5. Presenting with >24h of left arm 'heaviness', tongue felt numbness. She did not receive IV t-PA due to outside of time window.   1. Stroke: Persistent >24h with entire tongue numbness, left arm "heavy".  2. Hypertension- Permissive hypertension (OK if < 220/120) but gradually normalize in 5-7 days   . Long-term BP goal normotensive 3. Afib- on coumadin, subtherapeutic INR, recent too elevated INR, thus she held doses and now tool low. We discussed the difficulty in maintaining safe INR and she would benefit from switching to DOAC.   Plan:  HgbA1c, fasting lipid panel  MRI brain without contrast  CT Angiogram Head and Neck   PT consult, OT consult, Speech consult  Echocardiogram  Resume Coumadin and follow INR, goal 2  We have discussed switching to Eliquis, pt is considering, but currently wants to "think about it"  Allow for permissive hypertension for the first 24-48h - only  treat PRN if SBP >220 mmHg. Blood pressures can be gradually normalized to SBP<140 upon discharge  Statin Therapy - LDL goal < 70  Risk factor modification  Telemetry monitoring  Frequent neuro checks  Fall Precautions  DVT prophelaxis  NPO until swallowing evaluation has been passed (aspirin suppository if needed)   Attending Neurologist's note to follow  Desiree Metzger-Cihelka, ARNP-C, ANVP-BC Pager: [phone]   NEUROHOSPITALIST ADDENDUM Performed a face to face diagnostic evaluation.   I have reviewed the contents of history and physical exam as documented by PA/ARNP/Resident and agree with above documentation.  I have discussed and formulated the above plan as documented. Edits to the note have been made as needed.  Patient has no focal neurological deficits -normal neurological exam apart from some mild confusion.  She has distinct episode of left arm heaviness and numbness lasting for an hour concerning for TIA.  She also had what appears to be a presyncopal event yesterday.  Had one episode of visual aura, symptoms of tongue feeling numb as of the headache that could suggest migraine.  Patient is slightly confused according to the husband . Given her history of A. fib and being subtherapeutic on Coumadin-she definitely needs MRI brain to rule out acute stroke.  If MRI brain is  negative-symptoms could represent transient ischemic attack or possibly migraine.  Regardless I think we should adjust Coumadin dose, also discussed about switching to Eliquis which patient declined.  Remaining stroke work-up per recommendations above.  Stroke team will continue to follow     Karena Addison Cristella Stiver MD [facility] [phone]   If 7pm to 7am, please call on call as listed on AMION.

## 2019-12-04 NOTE — ED Provider Notes (Signed)
Patient placed in Quick Look pathway, seen and evaluated   Chief Complaint: Tongue numbness, left arm numbness, leg weakness  HPI:   Yesterday in a.m. patient noticed numbness to her right arm which reminded her of a previous stroke.  She reports that this waxed and waned throughout the day, then she developed some numbness to her tongue as well as an intermittent left leg numbness weakness.  Symptoms continued today she presents with concern of possible CVA.  She reports history of CVA in the past and is currently on Coumadin, she has been taking medications as prescribed.  ROS: + Paresthesias, left leg paresthesia, tongue paresthesia          -Fever, chills, fall/injury, neck pain, chest pain/shortness of breath, abdominal pain, nausea/vomiting or any additional concerns. Physical Exam:   Gen: No distress  Neuro: Awake and Alert  Skin: Warm    Focused Exam:Speech is clear and goal oriented, follows commands Major Cranial nerves without deficit, no facial droop Normal strength in upper and lower extremities bilaterally including dorsiflexion and plantar flexion, strong and equal grip strength Paresthesias of the left arm. Moves extremities without ataxia, coordination intact Normal finger to nose and rapid alternating movements Neg romberg, no pronator drift Normal gait  Patient out of window for code stroke.  Stroke labs and CT head ordered.   Initiation of care has begun. The patient has been counseled on the process, plan, and necessity for staying for the completion/evaluation, and the remainder of the medical screening examination   Gari Crown 12/04/19 1226    Noemi Chapel, MD 12/05/19 [phone]

## 2019-12-04 NOTE — ED Notes (Signed)
thge pt returned from [facility] or c-t  Admitting doc tor at  The bedside

## 2019-12-04 NOTE — ED Notes (Signed)
To ct

## 2019-12-04 NOTE — H&P (Signed)
History and Physical    Jeanette [name] [id number] DOB: 1934-11-04 DOA: 12/04/2019  PCP: Crist Infante, MD  Patient coming from: Home   Chief Complaint:  Chief Complaint  Patient presents with  . Stroke Symptoms     HPI:    85 year old female with past medical history of paroxysmal atrial fibrillation (on coumadin) , multiple strokes in the past (last 03/2018), hypertension, hyperlipidemia, diastolic congestive heart failure who presents to [facility] emergency department with complaints of arm numbness, visual field defect and slurring of speech.  Patient states that yesterday she began to experience left arm numbness, sudden onset, associated with difficulty in concentrating and enunciating her words.  Patient symptoms gradually resolved and patient returned to baseline.  Earlier today, patient then began to develop tongue numbness and blurriness of the left visual fields.  This was associated with a recurrence of patient's difficulty with speaking as well as concentrating.  Patient also endorses a mild unsteady gait.  This prompted the patient to present to [facility] emergency department for evaluation.  Of note, patient states that she is compliant with all of her medications including her home regimen of Coumadin.  Patient states that her INR was noted to be supratherapeutic approximately 2 weeks ago and she recently had her dosing reduced.  TPA was not administered in the emergency department due to patient being outside of the TPA window.  Patient evaluated by Dr. Lorraine Lax with in the emergency department who recommended MRI brain as well as CTA of neck.  MRI brain ended up revealing small acute infarcts of the right occipital lobe, right parietal lobe and right hippocampus.  CT of the neck revealed occlusion of the right posterior cerebral artery with mild to moderate stenosis of the M1 branch bilaterally as well as moderate stenosis at the right vertebral artery.   The hospitalist group was then called to assess patient for admission the hospital.   Review of Systems: A 10-system review of systems has been performed and all systems are negative with the exception of what is listed in the HPI.    Past Medical History:  Diagnosis Date  . A-fib ([place]) 03/15/2018  . Anal cancer ([place])   . Anxiety   . Arthritis   . Breast cancer ([place])   . Breast cancer ([place])   . Cancer of breast ([place])   . Depression   . Dysrhythmia 2010   a-fib  . Hyperlipidemia   . Hypertension   . PVD (peripheral vascular disease) ([place])   . RAS (renal artery stenosis) ([place])   . Stroke ([place]) 2019   no residual, x 2    Past Surgical History:  Procedure Laterality Date  . BREAST BIOPSY Left 01/14/2003  . BREAST LUMPECTOMY Right 1989  . CARDIOVASCULAR STRESS TEST  07/27/2003   No evidence if LV myocardial ischemia or scar. LV EF 74%.  [facility] GYNECOLOGIC CRYOSURGERY    . OPEN REDUCTION INTERNAL FIXATION (ORIF) DISTAL RADIAL FRACTURE Left 02/19/2017   Procedure: OPEN REDUCTION INTERNAL FIXATION (ORIF) LEFT DISTAL RADIAL FRACTURE;  Surgeon: Leanora Cover, MD;  Location: [place];  Service: Orthopedics;  Laterality: Left;  . OVARIAN CYST REMOVAL    . RENAL DOPPLER  09/09/2012   Right proximal renal artery: 60-99% diameter reduction. Normal patency of the left main renal artery.   . TRANSTHORACIC ECHOCARDIOGRAM  03/22/2006   EF 55-60%, mild mitral valvular regurg, pulmonary artery systolic pressure was moderately increased.  . TUBAL LIGATION  reports that she has never smoked. She has never used smokeless tobacco. She reports current alcohol use of about 2.0 standard drinks of alcohol per week. She reports that she does not use drugs.  Allergies  Allergen Reactions  . Epinephrine Anaphylaxis    Shortness of breath and increased heart rate  . Vytorin [Ezetimibe-Simvastatin]   . Benazepril Hcl Cough  . Crestor [Rosuvastatin Calcium] Other (See Comments)    cognitive   . Lisinopril Cough    Family History  Problem Relation Age of Onset  . Alzheimer's disease Mother   . Pneumonia Mother   . Heart failure Father   . Heart disease Sister   . Heart disease Brother   . Heart disease Brother   . Heart disease Sister   . Pneumonia Sister      Prior to Admission medications   Medication Sig Start Date End Date Taking? Authorizing Provider  ASPIRIN LOW DOSE 81 MG EC tablet TAKE 1 TABLET BY MOUTH EVERY DAY 06/30/19   Patwardhan, Manish J, MD  cholecalciferol (VITAMIN D) 1000 units tablet Take 1,000 Units by mouth daily.    [provider]  Coenzyme Q10 (COQ10 PO) Take 1 capsule by mouth daily.    [provider]  escitalopram (LEXAPRO) 10 MG tablet Take 10 mg by mouth daily.      [provider]  loratadine (CLARITIN) 10 MG tablet Take 10 mg by mouth daily as needed for allergies.    [provider]  metoprolol (TOPROL-XL) 200 MG 24 hr tablet Take 100 mg by mouth 2 (two) times daily.     [provider]  nitroGLYCERIN (NITROSTAT) 0.4 MG SL tablet PLACE 1 TABLET (0.4 MG TOTAL) UNDER THE TONGUE EVERY 5 (FIVE) MINUTES AS NEEDED FOR CHEST PAIN. 07/13/19 10/19/19  Patwardhan, Reynold Bowen, MD  olmesartan (BENICAR) 20 MG tablet Take 10 mg by mouth daily.     [provider]  pantoprazole (PROTONIX) 20 MG tablet 20 mg daily. 08/17/19   [provider]  REPATHA SURECLICK [id number] MG/ML SOAJ [id number] Milligram(s) SUB-Q Every 2 Weeks 10/12/19   [provider]  vitamin B-12 (CYANOCOBALAMIN) 100 MCG tablet Take 50 mcg by mouth daily.      [provider]  warfarin (COUMADIN) 5 MG tablet Take 2.5-5 mg by mouth daily. Take 5mg  on Mon, Wed and Fri. Take 2.5mg  tablet all other days    [provider]    Physical Exam: Vitals:   12/04/19 1715 12/04/19 1733 12/04/19 1951 12/04/19 2035  BP: (!) 163/84 (!) 174/75 (!) 154/112 (!) 160/87  Pulse: 68 64 65 65  Resp: 14 17 (!) 24 13  Temp:        TempSrc:      SpO2: 98% 98% 94% 96%    Constitutional: Acute alert and oriented x3, no associated distress.   Skin: no rashes, no lesions, good skin turgor noted. Eyes: Pupils are equally reactive to light.  No evidence of scleral icterus or conjunctival pallor.  ENMT: Moist mucous membranes noted.  Posterior pharynx clear of any exudate or lesions.   Neck: normal, supple, no masses, no thyromegaly.  No evidence of jugular venous distension.   Respiratory: clear to auscultation bilaterally, no wheezing, no crackles. Normal respiratory effort. No accessory muscle use.  Cardiovascular: Regular rate and rhythm, no murmurs / rubs / gallops. No extremity edema. 2+ pedal pulses. No carotid bruits.  Chest:   Nontender without crepitus or deformity.   Back:   Nontender without  crepitus or deformity. Abdomen: Abdomen is soft and nontender.  No evidence of intra-abdominal masses.  Positive bowel sounds noted in all quadrants.   Musculoskeletal: No joint deformity upper and lower extremities. Good ROM, no contractures. Normal muscle tone.  Neurologic: CN 2-12 grossly intact. Sensation intact, strength noted to be 5 out of 5 in all 4 extremities.  Patient is following all commands.  Patient is responsive to verbal stimuli.  Patient simply seems to exhibit occasional bouts of dysarthria during the interview. Psychiatric: Patient presents as a normal mood with appropriate affect.  Patient seems to possess insight as to theircurrent situation.     Labs on Admission: I have personally reviewed following labs and imaging studies -   CBC: Recent Labs  Lab 12/04/19 1228 12/04/19 1236  WBC 4.7  --   NEUTROABS 3.0  --   HGB 11.6* 12.6  HCT 37.0 37.0  MCV 94.9  --   PLT 220  --    Basic Metabolic Panel: Recent Labs  Lab 12/04/19 1228 12/04/19 1236  NA 136 139  K 3.8 4.0  CL 102 100  CO2 26  --   GLUCOSE 130* 129*  BUN 11 12  CREATININE 0.83 0.70  CALCIUM 10.4*  --    GFR: CrCl cannot be  calculated (Unknown ideal weight.). Liver Function Tests: Recent Labs  Lab 12/04/19 1228  AST 21  ALT 14  ALKPHOS 56  BILITOT 0.6  PROT 6.7  ALBUMIN 3.7   No results for input(s): LIPASE, AMYLASE in the last 168 hours. No results for input(s): AMMONIA in the last 168 hours. Coagulation Profile: Recent Labs  Lab 12/04/19 1228  INR 1.5*   Cardiac Enzymes: No results for input(s): CKTOTAL, CKMB, CKMBINDEX, TROPONINI in the last 168 hours. BNP (last 3 results) No results for input(s): PROBNP in the last 8760 hours. HbA1C: No results for input(s): HGBA1C in the last 72 hours. CBG: No results for input(s): GLUCAP in the last 168 hours. Lipid Profile: No results for input(s): CHOL, HDL, LDLCALC, TRIG, CHOLHDL, LDLDIRECT in the last 72 hours. Thyroid Function Tests: No results for input(s): TSH, T4TOTAL, FREET4, T3FREE, THYROIDAB in the last 72 hours. Anemia Panel: No results for input(s): VITAMINB12, FOLATE, FERRITIN, TIBC, IRON, RETICCTPCT in the last 72 hours. Urine analysis:    Component Value Date/Time   COLORURINE YELLOW 03/15/2018 2052   APPEARANCEUR CLEAR 03/15/2018 2052   LABSPEC >1.046 (H) 03/15/2018 2052   PHURINE 7.0 03/15/2018 2052   GLUCOSEU NEGATIVE 03/15/2018 2052   HGBUR SMALL (A) 03/15/2018 2052   BILIRUBINUR NEGATIVE 03/15/2018 2052   KETONESUR NEGATIVE 03/15/2018 2052   PROTEINUR NEGATIVE 03/15/2018 2052   UROBILINOGEN 0.2 03/14/2009 1400   NITRITE POSITIVE (A) 03/15/2018 2052   LEUKOCYTESUR LARGE (A) 03/15/2018 2052    Radiological Exams on Admission - Personally Reviewed: CT ANGIO HEAD W OR WO CONTRAST  Result Date: 12/04/2019 CLINICAL DATA:  Stroke follow-up.  Right arm numbness. EXAM: CT ANGIOGRAPHY HEAD AND NECK TECHNIQUE: Multidetector CT imaging of the head and neck was performed using the standard protocol during bolus administration of intravenous contrast. Multiplanar CT image reconstructions and MIPs were obtained to evaluate the vascular  anatomy. Carotid stenosis measurements (when applicable) are obtained utilizing NASCET criteria, using the distal internal carotid diameter as the denominator. CONTRAST:  78mL OMNIPAQUE IOHEXOL 350 MG/ML SOLN COMPARISON:  MRI head and CT head earlier today FINDINGS: CTA NECK FINDINGS Aortic arch: Standard branching. Imaged portion shows no evidence of aneurysm or dissection. No  significant stenosis of the major arch vessel origins. Mild atherosclerotic disease in the aortic arch and proximal great vessels Right carotid system: Mild atherosclerotic calcification proximal right internal carotid artery without stenosis. Irregularity of the cervical carotid compatible with mild fibromuscular dysplasia. No dissection. Left carotid system: Mild atherosclerotic calcification proximal left internal carotid artery without significant stenosis. No significant irregularity or dissection in the left carotid Vertebral arteries: Both vertebral arteries widely patent to the basilar. Moderate stenosis at the origin of the right vertebral artery. Left vertebral artery widely patent. Left vertebral artery is dominant. Skeleton: Cervical spondylosis.  No acute skeletal abnormality. Other neck: No mass or adenopathy. 6 mm calcification right thyroid without mass. No further imaging necessary. Upper chest: Lung apices clear bilaterally. Review of the MIP images confirms the above findings CTA HEAD FINDINGS Anterior circulation: Mild atherosclerotic calcification in the cavernous carotid bilaterally without significant stenosis. Mild to moderate stenosis in the M1 segment bilaterally. Middle cerebral artery branches widely patent. Moderate stenosis right A1 segment. Left A1 segment widely patent. Anterior cerebral arteries otherwise patent. Posterior circulation: Both vertebral arteries widely patent to the basilar. PICA patent bilaterally. Basilar widely patent. Occlusion of proximal right posterior cerebral artery which appears acute.  Left posterior cerebral artery patent without stenosis. Venous sinuses: Normal venous enhancement Anatomic variants: None Review of the MIP images confirms the above findings IMPRESSION: 1. Acute occlusion of the proximal right posterior cerebral artery. MRI demonstrated small acute infarcts in right hippocampus and right occipital lobe and right parietal. 2. Mild to moderate stenosis M1 segment bilaterally. Moderate stenosis right A1 segment 3. Moderate stenosis origin of right vertebral artery. Left vertebral artery widely patent 4. Mild atherosclerotic disease internal carotid artery bilaterally without significant stenosis. Atherosclerotic calcification in the cavernous carotid bilaterally without stenosis. 5. These results were called by telephone at the time of interpretation on 12/04/2019 at 7:47 pm to provider Aroor MD , who verbally acknowledged these results. Electronically Signed   By: Franchot Gallo M.D.   On: 12/04/2019 19:47   CT HEAD WO CONTRAST  Result Date: 12/04/2019 CLINICAL DATA:  Numbness tingling EXAM: CT HEAD WITHOUT CONTRAST TECHNIQUE: Contiguous axial images were obtained from the base of the skull through the vertex without intravenous contrast. COMPARISON:  MRI 09/08/2019, CT brain 09/09/2018 FINDINGS: Brain: No acute territorial infarction, hemorrhage or intracranial mass. Chronic left parietal infarct. Atrophy. Mild chronic small vessel ischemic change of the white matter. Stable ventricle size. Vascular: No hyperdense vessels.  Carotid vascular calcification Skull: Normal. Negative for fracture or focal lesion. Sinuses/Orbits: Mucosal thickening within the left maxillary sinus. Other: None IMPRESSION: 1. No CT evidence for acute intracranial abnormality. 2. Atrophy and chronic small vessel ischemic change of the white matter. Chronic left parietal infarct. Electronically Signed   By: Donavan Foil M.D.   On: 12/04/2019 17:19   CT ANGIO NECK W OR WO CONTRAST  Result Date:  12/04/2019 CLINICAL DATA:  Stroke follow-up.  Right arm numbness. EXAM: CT ANGIOGRAPHY HEAD AND NECK TECHNIQUE: Multidetector CT imaging of the head and neck was performed using the standard protocol during bolus administration of intravenous contrast. Multiplanar CT image reconstructions and MIPs were obtained to evaluate the vascular anatomy. Carotid stenosis measurements (when applicable) are obtained utilizing NASCET criteria, using the distal internal carotid diameter as the denominator. CONTRAST:  2mL OMNIPAQUE IOHEXOL 350 MG/ML SOLN COMPARISON:  MRI head and CT head earlier today FINDINGS: CTA NECK FINDINGS Aortic arch: Standard branching. Imaged portion shows no evidence of aneurysm or  dissection. No significant stenosis of the major arch vessel origins. Mild atherosclerotic disease in the aortic arch and proximal great vessels Right carotid system: Mild atherosclerotic calcification proximal right internal carotid artery without stenosis. Irregularity of the cervical carotid compatible with mild fibromuscular dysplasia. No dissection. Left carotid system: Mild atherosclerotic calcification proximal left internal carotid artery without significant stenosis. No significant irregularity or dissection in the left carotid Vertebral arteries: Both vertebral arteries widely patent to the basilar. Moderate stenosis at the origin of the right vertebral artery. Left vertebral artery widely patent. Left vertebral artery is dominant. Skeleton: Cervical spondylosis.  No acute skeletal abnormality. Other neck: No mass or adenopathy. 6 mm calcification right thyroid without mass. No further imaging necessary. Upper chest: Lung apices clear bilaterally. Review of the MIP images confirms the above findings CTA HEAD FINDINGS Anterior circulation: Mild atherosclerotic calcification in the cavernous carotid bilaterally without significant stenosis. Mild to moderate stenosis in the M1 segment bilaterally. Middle cerebral  artery branches widely patent. Moderate stenosis right A1 segment. Left A1 segment widely patent. Anterior cerebral arteries otherwise patent. Posterior circulation: Both vertebral arteries widely patent to the basilar. PICA patent bilaterally. Basilar widely patent. Occlusion of proximal right posterior cerebral artery which appears acute. Left posterior cerebral artery patent without stenosis. Venous sinuses: Normal venous enhancement Anatomic variants: None Review of the MIP images confirms the above findings IMPRESSION: 1. Acute occlusion of the proximal right posterior cerebral artery. MRI demonstrated small acute infarcts in right hippocampus and right occipital lobe and right parietal. 2. Mild to moderate stenosis M1 segment bilaterally. Moderate stenosis right A1 segment 3. Moderate stenosis origin of right vertebral artery. Left vertebral artery widely patent 4. Mild atherosclerotic disease internal carotid artery bilaterally without significant stenosis. Atherosclerotic calcification in the cavernous carotid bilaterally without stenosis. 5. These results were called by telephone at the time of interpretation on 12/04/2019 at 7:47 pm to provider Aroor MD , who verbally acknowledged these results. Electronically Signed   By: Franchot Gallo M.D.   On: 12/04/2019 19:47   MR BRAIN WO CONTRAST  Result Date: 12/04/2019 CLINICAL DATA:  Focal neuro deficit greater than 6 hours. Right arm numbness. EXAM: MRI HEAD WITHOUT CONTRAST TECHNIQUE: Multiplanar, multiecho pulse sequences of the brain and surrounding structures were obtained without intravenous contrast. COMPARISON:  CT head 12/04/2019.  MRI 09/08/2019 FINDINGS: Brain: 4 mm acute infarct right occipital lobe. 5 x 10 mm acute infarct right parietal cortex. Acute infarct in the right hippocampus. Generalized atrophy. Chronic infarct left parietal lobe. Mild chronic microvascular ischemic change in the white matter. Negative for hemorrhage Enhancing  extra-axial mass right frontal lobe measuring 6 x 10 mm compatible with small meningioma. No change from the prior MRI. Vascular: Normal arterial flow voids Skull and upper cervical spine: No focal skeletal lesion. Sinuses/Orbits: Mucosal edema paranasal sinuses. Bilateral cataract extraction. Other: None IMPRESSION: Small acute infarct right occipital and right parietal lobe. Larger infarct right hippocampus 6 x 10 mm right frontal meningioma unchanged from prior study. Electronically Signed   By: Franchot Gallo M.D.   On: 12/04/2019 18:54    EKG: Personally reviewed.  Rhythm is normal sinus rhythm with heart rate of 68 bpm.  No dynamic ST segment changes appreciated.  Assessment/Plan Principal Problem:   Ischemic stroke [facility])   Patient presents with multiple transient vague neurologic complaints and presented outside of the TPA window upon arrival to [facility] health emergency department  Patient has already been evaluated by Dr. Lorraine Lax with allergy in the  emergency department who recommended MRI brain as well as CT of the neck.  MRI brain reveals small acute infarcts of the right occipital lobe, right parietal lobe and right hippocampus with CTA of the neck revealing right posterior cerebral artery occlusion with mild to moderate stenosis of the M1 branch bilaterally and moderate stenosis of the right vertebral artery.  Of note, patient endorses difficulty with achieving an appropriate INR on her home regimen of Coumadin.  Patient has experienced multiple strokes in the past under similar circumstances.  Considering patient's multiple infarcts these to be secondary to patient's inadequate anticoagulation in the setting of paroxysmal atrial fibrillation.  Posterior circulation disease may also be contributing.  Per my discussion with the patient, she is now agreeable to be placed on Eliquis.  I have briefly discussed this revelation with Dr. Leonel Ramsay who stated that she will review the patient's  case and determine when initiation of Eliquis will be appropriate.  We will abstain from initiation of aspirin or any other anticoagulant for now until we get input from Dr. Leonel Ramsay which is certainly appreciated.  Patient is already on [place] for lipid lowering therapy as an outpatient  Placing a medical telemetry unit  Performing serial neurologic checks  Obtaining echocardiogram with bubble study  Physical therapy, Occupational Therapy and speech therapy evaluations  Active Problems:   Vertebral artery stenosis with cerebral infarction [facility])   Please see assessment and plan above    Essential hypertension   Relative permissive hypertension although we will continue home regimen of antihypertensive therapy.    Paroxysmal atrial fibrillation (HCC)   Continue home regimen of metoprolol 100 mg twice daily  Monitoring on telemetry  Patient is currently in normal sinus rhythm    Mixed hyperlipidemia   Patient is already receiving Repatha injections in the outpatient setting.    Chronic diastolic CHF (congestive heart failure) (HCC)   No evidence of cardiogenic volume overload at this time      Code Status:  Full code Family Communication: Husband is in the hospital and has been updated on plan of care by the nursing staff, he is not present upon my interview of patient.  Status is: Observation  The patient remains OBS appropriate and will d/c before 2 midnights.  Dispo: The patient is from: Home              Anticipated d/c is to: Home              Anticipated d/c date is: 2 days              Patient currently is not medically stable to d/c.        Vernelle Emerald MD [facility] Pager [phone]  If 7PM-7AM, please contact night-coverage [email or web address] Use universal [place] password for that web site. If you do not have the password, please call the hospital operator.  12/04/2019, 8:45 PM

## 2019-12-04 NOTE — Progress Notes (Signed)
CTP ordered in response to CTA/MRI findings. On my exam around 10pm, she now has had recurrence of L hemianopia. After discussion with IM admitter, he did check this and it was not present around 8 when he evaluated her. Given that she is now symptomatic, and waxing/waning, the option to do thrombectomy was discussed with the patient and her husband. After discussing the risks and benefits of the procedure, they decided to proceed.   She will be admitted to the ICU after thrombectomy.   This patient is critically ill and at significant risk of neurological worsening, death and care requires constant monitoring of vital signs, hemodynamics,respiratory and cardiac monitoring, neurological assessment, discussion with family, other specialists and medical decision making of high complexity. I spent 50 minutes of neurocritical care time  in the care of  this patient. This was time spent independent of any time provided by nurse practitioner or PA.  Roland Rack, MD [facility] [phone]  If 7pm- 7am, please page neurology on call as listed in [place]. 12/04/2019  10:57 PM

## 2019-12-04 NOTE — ED Notes (Signed)
Pt a and o x4skin warm and dry  No distress

## 2019-12-04 NOTE — Procedures (Signed)
INTERVENTIONAL NEURORADIOLOGY BRIEF POSTPROCEDURE NOTE  DIAGNOSTIC CEREBRAL ANGIOGRAM AND MECHANICAL THROMBECTOMY  Attending: Dr. Pedro Earls  Assistant: None  Diagnosis: Right P2/PCA occlusion  Access site: Left radial artery  Access closure: Inflatable band  Anesthesia: General  Medication used: refer to anesthesia documentation.  Complications: None  Estimated blood loss: 50 mL  Specimen: None  Findings: There was a proximal right P2/PCA occlusion. Mechanical thrombectomy performed with combined aspiration + stent retriever (embotrap) with complete recanalization (TICI 3). No embolus to new territory. No hemorrhage on post procedure flat panel CT.  The patient tolerated the procedure well without incident or complication and is in stable condition.

## 2019-12-04 NOTE — ED Provider Notes (Addendum)
[facility] EMERGENCY DEPARTMENT Provider Note   CSN: [id number] Arrival date & time: 12/04/19  1203     History Chief Complaint  Patient presents with  . Stroke Symptoms    Jeanette [name] is a 85 y.o. female.  85 yo F with a chief complaints of left-sided arm numbness and left-sided face numbness.  This occurred yesterday morning.  Lasted for about an hour and then resolved.  Today the patient's had some slowed mentation from her baseline.  She says she just felt like her brain was foggy.  Ended up having a left-sided visual field deficit that she said was in the far periphery of her vision.  Unsure if that improved with closure of her left eye.  She also felt like her tongue was numb.  She then developed to headache.  Feels that the headache is different than her normal.  Has remote history of migraines and improved with chiropractor.  Has a history of a stroke.  The history is provided by the patient.  Illness Severity:  Moderate Onset quality:  Gradual Duration:  1 day Timing:  Intermittent Progression:  Resolved Chronicity:  Recurrent Associated symptoms: headaches   Associated symptoms: no chest pain, no congestion, no fever, no myalgias, no nausea, no rhinorrhea, no shortness of breath, no vomiting and no wheezing        Past Medical History:  Diagnosis Date  . A-fib ([place]) 03/15/2018  . Anal cancer ([place])   . Anxiety   . Arthritis   . Breast cancer ([place])   . Breast cancer ([place])   . Cancer of breast ([place])   . Depression   . Dysrhythmia 2010   a-fib  . Hyperlipidemia   . Hypertension   . PVD (peripheral vascular disease) ([place])   . RAS (renal artery stenosis) ([place])   . Stroke [facility]) 2019   no residual, x 2    Patient Active Problem List   Diagnosis Date Noted  . Chest pressure 03/06/2019  . H/O: stroke 03/06/2019  . CVA (cerebral vascular accident) ([place]) 03/15/2018  . Paroxysmal atrial fibrillation ([place]) 03/15/2018  . Aphasia   . Mixed  hyperlipidemia   . Bad headache   . Renal artery stenosis ([place]) 10/21/2013  . Essential hypertension 10/21/2013  . PALPITATIONS, RECURRENT 06/22/2009  . STRESS ELECTROCARDIOGRAM, ABNORMAL 06/22/2009  . SHORTNESS OF BREATH 05/25/2009  . DYSLIPIDEMIA 03/21/2009  . ANEMIA 03/21/2009  . CEREBRAL EMBOLISM WITH CEREBRAL INFARCTION 03/21/2009  . DEGENERATIVE DISC DISEASE 03/21/2009  . CHEST PAIN-UNSPECIFIED 03/21/2009    Past Surgical History:  Procedure Laterality Date  . BREAST BIOPSY Left 01/14/2003  . BREAST LUMPECTOMY Right 1989  . CARDIOVASCULAR STRESS TEST  07/27/2003   No evidence if LV myocardial ischemia or scar. LV EF 74%.  [facility] GYNECOLOGIC CRYOSURGERY    . OPEN REDUCTION INTERNAL FIXATION (ORIF) DISTAL RADIAL FRACTURE Left 02/19/2017   Procedure: OPEN REDUCTION INTERNAL FIXATION (ORIF) LEFT DISTAL RADIAL FRACTURE;  Surgeon: Leanora Cover, MD;  Location: [place];  Service: Orthopedics;  Laterality: Left;  . OVARIAN CYST REMOVAL    . RENAL DOPPLER  09/09/2012   Right proximal renal artery: 60-99% diameter reduction. Normal patency of the left main renal artery.   . TRANSTHORACIC ECHOCARDIOGRAM  03/22/2006   EF 55-60%, mild mitral valvular regurg, pulmonary artery systolic pressure was moderately increased.  . TUBAL LIGATION       OB History    Gravida  1   Para  1  Term  1   Preterm      AB      Living  1     SAB      TAB      Ectopic      Multiple      Live Births  1           Family History  Problem Relation Age of Onset  . Alzheimer's disease Mother   . Pneumonia Mother   . Heart failure Father   . Heart disease Sister   . Heart disease Brother   . Heart disease Brother   . Heart disease Sister   . Pneumonia Sister     Social History   Tobacco Use  . Smoking status: Never Smoker  . Smokeless tobacco: Never Used  Substance Use Topics  . Alcohol use: Yes    Alcohol/week: 2.0 standard drinks    Types: 2 Glasses of wine per  week    Comment: rare  . Drug use: No    Home Medications Prior to Admission medications   Medication Sig Start Date End Date Taking? Authorizing Provider  ASPIRIN LOW DOSE 81 MG EC tablet TAKE 1 TABLET BY MOUTH EVERY DAY 06/30/19   Patwardhan, Manish J, MD  cholecalciferol (VITAMIN D) 1000 units tablet Take 1,000 Units by mouth daily.    [provider]  Coenzyme Q10 (COQ10 PO) Take 1 capsule by mouth daily.    [provider]  escitalopram (LEXAPRO) 10 MG tablet Take 10 mg by mouth daily.      [provider]  loratadine (CLARITIN) 10 MG tablet Take 10 mg by mouth daily as needed for allergies.    [provider]  metoprolol (TOPROL-XL) 200 MG 24 hr tablet Take 100 mg by mouth 2 (two) times daily.     [provider]  nitroGLYCERIN (NITROSTAT) 0.4 MG SL tablet PLACE 1 TABLET (0.4 MG TOTAL) UNDER THE TONGUE EVERY 5 (FIVE) MINUTES AS NEEDED FOR CHEST PAIN. 07/13/19 10/19/19  Patwardhan, Reynold Bowen, MD  olmesartan (BENICAR) 20 MG tablet Take 10 mg by mouth daily.     [provider]  pantoprazole (PROTONIX) 20 MG tablet 20 mg daily. 08/17/19   [provider]  REPATHA SURECLICK [id number] MG/ML SOAJ [id number] Milligram(s) SUB-Q Every 2 Weeks 10/12/19   [provider]  vitamin B-12 (CYANOCOBALAMIN) 100 MCG tablet Take 50 mcg by mouth daily.      [provider]  warfarin (COUMADIN) 5 MG tablet Take 2.5-5 mg by mouth daily. Take 5mg  on Mon, Wed and Fri. Take 2.5mg  tablet all other days    [provider]    Allergies    Epinephrine, Vytorin [ezetimibe-simvastatin], Benazepril hcl, Crestor [rosuvastatin calcium], and Lisinopril  Review of Systems   Review of Systems  Constitutional: Negative for chills and fever.  HENT: Negative for congestion and rhinorrhea.   Eyes: Positive for visual disturbance. Negative for redness.  Respiratory: Negative for shortness of breath and wheezing.   Cardiovascular: Negative for  chest pain and palpitations.  Gastrointestinal: Negative for nausea and vomiting.  Genitourinary: Negative for dysuria and urgency.  Musculoskeletal: Negative for arthralgias and myalgias.  Skin: Negative for pallor and wound.  Neurological: Positive for weakness, numbness and headaches. Negative for dizziness.    Physical Exam Updated Vital Signs BP (!) 174/75   Pulse 64   Temp 98 F (36.7 C) (Oral)   Resp 17   SpO2 98%   Physical Exam Vitals and nursing  note reviewed.  Constitutional:      General: She is not in acute distress.    Appearance: She is well-developed. She is not diaphoretic.  HENT:     Head: Normocephalic and atraumatic.  Eyes:     Pupils: Pupils are equal, round, and reactive to light.  Cardiovascular:     Rate and Rhythm: Normal rate and regular rhythm.     Heart sounds: No murmur. No friction rub. No gallop.   Pulmonary:     Effort: Pulmonary effort is normal.     Breath sounds: No wheezing or rales.  Abdominal:     General: There is no distension.     Palpations: Abdomen is soft.     Tenderness: There is no abdominal tenderness.  Musculoskeletal:        General: No tenderness.     Cervical back: Normal range of motion and neck supple.  Skin:    General: Skin is warm and dry.  Neurological:     Mental Status: She is alert and oriented to person, place, and time.     Cranial Nerves: Cranial nerves are intact.     Sensory: Sensation is intact.     Motor: Motor function is intact.     Coordination: Coordination is intact.     Gait: Gait is intact.     Comments: Benign neurologic exam  Psychiatric:        Behavior: Behavior normal.     ED Results / Procedures / Treatments   Labs (all labs ordered are listed, but only abnormal results are displayed) Labs Reviewed  PROTIME-INR - Abnormal; Notable for the following components:      Result Value   Prothrombin Time 17.9 (*)    INR 1.5 (*)    All other components within normal limits  CBC -  Abnormal; Notable for the following components:   Hemoglobin 11.6 (*)    RDW 15.7 (*)    All other components within normal limits  COMPREHENSIVE METABOLIC PANEL - Abnormal; Notable for the following components:   Glucose, Bld 130 (*)    Calcium 10.4 (*)    All other components within normal limits  I-STAT CHEM 8, ED - Abnormal; Notable for the following components:   Glucose, Bld 129 (*)    Calcium, Ion 1.45 (*)    All other components within normal limits  SARS CORONAVIRUS 2 BY RT PCR (HOSPITAL ORDER, [facility])  ETHANOL  APTT  DIFFERENTIAL  RAPID URINE DRUG SCREEN, HOSP PERFORMED  URINALYSIS, ROUTINE W REFLEX MICROSCOPIC  LIPID PANEL  HEMOGLOBIN A1C    EKG EKG Interpretation  Date/Time:  Friday Dec 04 2019 12:06:48 EDT Ventricular Rate:  68 PR Interval:  166 QRS Duration: 66 QT Interval:  408 QTC Calculation: 433 R Axis:   54 Text Interpretation: Normal sinus rhythm Normal ECG No significant change since last tracing Confirmed by Deno Etienne [phone]) on 12/04/2019 4:21:39 PM   Radiology CT HEAD WO CONTRAST  Result Date: 12/04/2019 CLINICAL DATA:  Numbness tingling EXAM: CT HEAD WITHOUT CONTRAST TECHNIQUE: Contiguous axial images were obtained from the base of the skull through the vertex without intravenous contrast. COMPARISON:  MRI 09/08/2019, CT brain 09/09/2018 FINDINGS: Brain: No acute territorial infarction, hemorrhage or intracranial mass. Chronic left parietal infarct. Atrophy. Mild chronic small vessel ischemic change of the white matter. Stable ventricle size. Vascular: No hyperdense vessels.  Carotid vascular calcification Skull: Normal. Negative for fracture or focal lesion. Sinuses/Orbits: Mucosal thickening within  the left maxillary sinus. Other: None IMPRESSION: 1. No CT evidence for acute intracranial abnormality. 2. Atrophy and chronic small vessel ischemic change of the white matter. Chronic left parietal infarct. Electronically  Signed   By: Donavan Foil M.D.   On: 12/04/2019 17:19   MR BRAIN WO CONTRAST  Result Date: 12/04/2019 CLINICAL DATA:  Focal neuro deficit greater than 6 hours. Right arm numbness. EXAM: MRI HEAD WITHOUT CONTRAST TECHNIQUE: Multiplanar, multiecho pulse sequences of the brain and surrounding structures were obtained without intravenous contrast. COMPARISON:  CT head 12/04/2019.  MRI 09/08/2019 FINDINGS: Brain: 4 mm acute infarct right occipital lobe. 5 x 10 mm acute infarct right parietal cortex. Acute infarct in the right hippocampus. Generalized atrophy. Chronic infarct left parietal lobe. Mild chronic microvascular ischemic change in the white matter. Negative for hemorrhage Enhancing extra-axial mass right frontal lobe measuring 6 x 10 mm compatible with small meningioma. No change from the prior MRI. Vascular: Normal arterial flow voids Skull and upper cervical spine: No focal skeletal lesion. Sinuses/Orbits: Mucosal edema paranasal sinuses. Bilateral cataract extraction. Other: None IMPRESSION: Small acute infarct right occipital and right parietal lobe. Larger infarct right hippocampus 6 x 10 mm right frontal meningioma unchanged from prior study. Electronically Signed   By: Franchot Gallo M.D.   On: 12/04/2019 18:54    Procedures Procedures (including critical care time)  Medications Ordered in ED Medications  HYDROcodone-acetaminophen (NORCO/VICODIN) 5-325 MG per tablet 0.5 tablet (0.5 tablets Oral Given 12/04/19 1730)  iohexol (OMNIPAQUE) 350 MG/ML injection 75 mL (75 mLs Intravenous Contrast Given 12/04/19 1909)    ED Course  I have reviewed the triage vital signs and the nursing notes.  Pertinent labs & imaging results that were available during my care of the patient were reviewed by me and considered in my medical decision making (see chart for details).    MDM Rules/Calculators/A&P                      85 yo F with a chief complaints of slowed mentation and headache.  She had an  episode yesterday where she felt numb to her left arm.  Has had this happen to her in the past.  Currently has a headache but denies other symptoms.  Discussed case with Dr. Lorraine Lax, Recommended MRI.  Acute stroke on MRI.  Admit.  Found to have acute PCA occlusion.  Continues to have waxing and waning symptoms.  Taken urgently to IR.   CRITICAL CARE Performed by: Cecilio Asper   Total critical care time: 35 minutes  Critical care time was exclusive of separately billable procedures and treating other patients.  Critical care was necessary to treat or prevent imminent or life-threatening deterioration.  Critical care was time spent personally by me on the following activities: development of treatment plan with patient and/or surrogate as well as nursing, discussions with consultants, evaluation of patient's response to treatment, examination of patient, obtaining history from patient or surrogate, ordering and performing treatments and interventions, ordering and review of laboratory studies, ordering and review of radiographic studies, pulse oximetry and re-evaluation of patient's condition.   The patients results and plan were reviewed and discussed.   Any x-rays performed were independently reviewed by myself.   Differential diagnosis were considered with the presenting HPI.  Medications  HYDROcodone-acetaminophen (NORCO/VICODIN) 5-325 MG per tablet 0.5 tablet (0.5 tablets Oral Given 12/04/19 1730)  iohexol (OMNIPAQUE) 350 MG/ML injection 75 mL (75 mLs Intravenous Contrast Given 12/04/19 1909)  Vitals:   12/04/19 1645 12/04/19 1700 12/04/19 1715 12/04/19 1733  BP: (!) 156/67 (!) 146/71 (!) 163/84 (!) 174/75  Pulse: 61 (!) 59 68 64  Resp: 15 18 14 17   Temp:      TempSrc:      SpO2: 97% 96% 98% 98%    Final diagnoses:  Acute cardioembolic stroke ([place])    Admission/ observation were discussed with the admitting physician, patient and/or family and they are comfortable  with the plan.   Final Clinical Impression(s) / ED Diagnoses Final diagnoses:  Acute cardioembolic stroke [facility])    Rx / DC Orders ED Discharge Orders    None       Deno Etienne, DO 12/04/19 [place], [place], DO 12/04/19 2220

## 2019-12-04 NOTE — ED Triage Notes (Signed)
Pt reports around 10 am yesterday she began having numbness in her tongue, L arm heaviness, mental cloudiness and difficulty focusing on objects visually. Pt a/ox4, speech clear. Grip strength equal bilaterally, face symmetrical. No neuro deficits noted.

## 2019-12-04 NOTE — Anesthesia Preprocedure Evaluation (Addendum)
Anesthesia Evaluation  Patient identified by MRN, date of birth, ID band Patient awake    Reviewed: Allergy & Precautions, NPO status , Patient's Chart, lab work & pertinent test results, reviewed documented beta blocker date and time Preop documentation limited or incomplete due to emergent nature of procedure.  Airway Mallampati: II  TM Distance: >3 FB Neck ROM: Full    Dental  (+) Teeth Intact, Dental Advisory Given, Chipped   Pulmonary asthma ,    Pulmonary exam normal breath sounds clear to auscultation       Cardiovascular hypertension, Pt. on home beta blockers + Peripheral Vascular Disease  + dysrhythmias Atrial Fibrillation  Rhythm:Irregular Rate:Normal  Narrative Lexiscan Myoview stress test 04/20/2019: Lexiscan stress test was performed. Stress EKG is non-diagnostic, as this  is pharmacological stress test. In addition, rest and stress EKG showed  sinus rhythm/tachycardia, occasional PAC's, no ST-T abnormality.  SPECT stress and rest images demonstrate a small size, mild intensity  perfusion defect in basal inferoseptal myocardium with minimal  reversibility. Normal wall thickening. Stress LVEF 54%. Low risk study.   Narrative Echocardiogram 03/25/2019: Left ventricle cavity is normal in size. Normal left ventricular wall  thickness. Normal LV systolic function with EF 56%. Normal global wall  motion. Doppler evidence of grade I (impaired) diastolic dysfunction,  normal LAP. Calculated EF 56%. Trileaflet aortic valve. Trace aortic regurgitation. Mild tricuspid regurgitation. Estimated pulmonary artery systolic pressure  is 30 mmHg.    Neuro/Psych PSYCHIATRIC DISORDERS Anxiety Depression CVA, No Residual Symptoms    GI/Hepatic negative GI ROS, Neg liver ROS,   Endo/Other  negative endocrine ROS  Renal/GU Renal hypertensionRenal disease     Musculoskeletal  (+) Arthritis , Osteoarthritis,    Abdominal    Peds  Hematology  (+) Blood dyscrasia, anemia ,   Anesthesia Other Findings Day of surgery medications reviewed with the patient.  Reproductive/Obstetrics                          Anesthesia Physical  Anesthesia Plan  ASA: III and emergent  Anesthesia Plan: General   Post-op Pain Management:    Induction: Intravenous, Rapid sequence and Cricoid pressure planned  PONV Risk Score and Plan: 3 and Ondansetron, Dexamethasone and Treatment may vary due to age or medical condition  Airway Management Planned: Oral ETT  Additional Equipment:   Intra-op Plan:   Post-operative Plan: Possible Post-op intubation/ventilation  Informed Consent: I have reviewed the patients History and Physical, chart, labs and discussed the procedure including the risks, benefits and alternatives for the proposed anesthesia with the patient or authorized representative who has indicated his/her understanding and acceptance.     Dental advisory given  Plan Discussed with: CRNA, Anesthesiologist and Surgeon  Anesthesia Plan Comments:       Anesthesia Quick Evaluation

## 2019-12-05 ENCOUNTER — Inpatient Hospital Stay (HOSPITAL_COMMUNITY): Payer: Medicare Other

## 2019-12-05 LAB — HEMOGLOBIN A1C
Hgb A1c MFr Bld: 6 % — ABNORMAL HIGH (ref 4.8–5.6)
Mean Plasma Glucose: 125.5 mg/dL

## 2019-12-05 LAB — URINALYSIS, ROUTINE W REFLEX MICROSCOPIC
Bilirubin Urine: NEGATIVE
Glucose, UA: NEGATIVE mg/dL
Hgb urine dipstick: NEGATIVE
Ketones, ur: 5 mg/dL — AB
Nitrite: NEGATIVE
Protein, ur: NEGATIVE mg/dL
Specific Gravity, Urine: >1.046 — ABNORMAL HIGH (ref 1.005–1.030)
pH: 7 (ref 5.0–8.0)

## 2019-12-05 LAB — LIPID PANEL
Cholesterol: 119 mg/dL (ref 0–200)
HDL: 44 mg/dL (ref >40)
LDL Cholesterol: 59 mg/dL (ref 0–99)
Total CHOL/HDL Ratio: 2.7 RATIO
Triglycerides: 79 mg/dL (ref <150)
VLDL: 16 mg/dL (ref 0–40)

## 2019-12-05 LAB — RAPID URINE DRUG SCREEN, HOSP PERFORMED
Amphetamines: NOT DETECTED
Barbiturates: NOT DETECTED
Benzodiazepines: NOT DETECTED
Cocaine: NOT DETECTED
Opiates: POSITIVE — AB
Tetrahydrocannabinol: NOT DETECTED

## 2019-12-05 LAB — MRSA PCR SCREENING: MRSA by PCR: NEGATIVE

## 2019-12-05 MED ORDER — ACETAMINOPHEN 325 MG PO TABS
650.0000 mg | ORAL_TABLET | ORAL | Status: DC | PRN
  Administered 2019-12-05: 650 mg via ORAL
  Filled 2019-12-05: qty 2

## 2019-12-05 MED ORDER — ACETAMINOPHEN 650 MG RE SUPP: 650.0000 mg | RECTAL | Status: DC | PRN

## 2019-12-05 MED ORDER — APIXABAN 5 MG PO TABS
5.0000 mg | ORAL_TABLET | Freq: Two times a day (BID) | ORAL | Status: DC
  Administered 2019-12-05 – 2019-12-06 (×2): 5 mg via ORAL
  Filled 2019-12-05 (×2): qty 1

## 2019-12-05 MED ORDER — APIXABAN 5 MG PO TABS
5.0000 mg | ORAL_TABLET | Freq: Two times a day (BID) | ORAL | Status: DC
  Administered 2019-12-05: 5 mg via ORAL
  Filled 2019-12-05: qty 1

## 2019-12-05 MED ORDER — CHLORHEXIDINE GLUCONATE 0.12 % MT SOLN
15.0000 mL | Freq: Two times a day (BID) | OROMUCOSAL | Status: DC
  Administered 2019-12-05 – 2019-12-06 (×2): 15 mL via OROMUCOSAL
  Filled 2019-12-05: qty 15

## 2019-12-05 MED ORDER — IOHEXOL 240 MG/ML SOLN: 150.0000 mL | Freq: Once | INTRAMUSCULAR | Status: DC | PRN

## 2019-12-05 MED ORDER — HYDROCODONE-ACETAMINOPHEN 5-325 MG PO TABS
0.5000 | ORAL_TABLET | ORAL | Status: DC | PRN
  Administered 2019-12-05: 0.5 via ORAL
  Administered 2019-12-05: 1 via ORAL
  Administered 2019-12-05: 0.5 via ORAL
  Administered 2019-12-06: 1 via ORAL
  Filled 2019-12-05 (×4): qty 1

## 2019-12-05 MED ORDER — VITAMIN D 25 MCG (1000 UNIT) PO TABS
1000.0000 [IU] | ORAL_TABLET | Freq: Every day | ORAL | Status: DC
  Administered 2019-12-05 – 2019-12-06 (×2): 1000 [IU] via ORAL
  Filled 2019-12-05 (×2): qty 1

## 2019-12-05 MED ORDER — LABETALOL HCL 5 MG/ML IV SOLN
INTRAVENOUS | Status: DC | PRN
Start: 2019-12-05 — End: 2019-12-05
  Administered 2019-12-05 (×2): 5 mg via INTRAVENOUS

## 2019-12-05 MED ORDER — POLYETHYLENE GLYCOL 3350 17 G PO PACK: 17.0000 g | PACK | Freq: Every day | ORAL | Status: DC | PRN

## 2019-12-05 MED ORDER — ONDANSETRON HCL 4 MG/2ML IJ SOLN: 4.0000 mg | Freq: Four times a day (QID) | INTRAMUSCULAR | Status: DC | PRN

## 2019-12-05 MED ORDER — METOPROLOL SUCCINATE ER 50 MG PO TB24: 100.0000 mg | ORAL_TABLET | Freq: Two times a day (BID) | ORAL | Status: DC

## 2019-12-05 MED ORDER — ACETAMINOPHEN 160 MG/5ML PO SOLN: 650.0000 mg | ORAL | Status: DC | PRN

## 2019-12-05 MED ORDER — BUTALBITAL-APAP-CAFFEINE 50-325-40 MG PO TABS
1.0000 | ORAL_TABLET | ORAL | Status: DC | PRN
  Administered 2019-12-05 – 2019-12-06 (×2): 1 via ORAL
  Filled 2019-12-05 (×2): qty 1

## 2019-12-05 MED ORDER — CLEVIDIPINE BUTYRATE 0.5 MG/ML IV EMUL: 0.0000 mg/h | INTRAVENOUS | Status: DC

## 2019-12-05 MED ORDER — STROKE: EARLY STAGES OF RECOVERY BOOK: Freq: Once | Status: AC

## 2019-12-05 MED ORDER — CHLORHEXIDINE GLUCONATE CLOTH 2 % EX PADS
6.0000 | MEDICATED_PAD | Freq: Every day | CUTANEOUS | Status: DC
  Administered 2019-12-06: 6 via TOPICAL

## 2019-12-05 MED ORDER — PANTOPRAZOLE SODIUM 20 MG PO TBEC
20.0000 mg | DELAYED_RELEASE_TABLET | Freq: Every day | ORAL | Status: DC
  Administered 2019-12-05 – 2019-12-06 (×2): 20 mg via ORAL
  Filled 2019-12-05 (×3): qty 1

## 2019-12-05 MED ORDER — ESCITALOPRAM OXALATE 10 MG PO TABS
10.0000 mg | ORAL_TABLET | Freq: Every day | ORAL | Status: DC
  Administered 2019-12-05 – 2019-12-06 (×2): 10 mg via ORAL
  Filled 2019-12-05 (×2): qty 1

## 2019-12-05 MED ORDER — IRBESARTAN 150 MG PO TABS: 75.0000 mg | ORAL_TABLET | Freq: Every day | ORAL | Status: DC

## 2019-12-05 MED ORDER — MENTHOL 3 MG MT LOZG
1.0000 | LOZENGE | OROMUCOSAL | Status: DC | PRN
  Filled 2019-12-05: qty 9

## 2019-12-05 MED ORDER — SODIUM CHLORIDE 0.9 % IV BOLUS
1000.0000 mL | Freq: Once | INTRAVENOUS | Status: AC
  Administered 2019-12-05: 1000 mL via INTRAVENOUS

## 2019-12-05 NOTE — Progress Notes (Signed)
STROKE TEAM PROGRESS NOTE   HISTORY OF PRESENT ILLNESS (per record) Jeanette [name] is an 85 y.o. 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 She is on Coumadin for Afib and reports that recently her INR was elevated and she held coumadin, however today it is 1.5. She reports that after having breakfast yesterday am, she had sudden onset of left arm 'heaviness', tongue felt numb and she became mentally cloudy. She took a nap thinking that would help, but it did not. When symptoms persisted today, she sought medical attention.  Date last known well: 12/04/19 Time last known well: 10:30am tPA Given: no, outside of time window   INTERVAL HISTORY Her family is not at bedside.  Patient is sitting up in a chair.  She is drowsy but easily aroused and answers appropriately and is a good historian. She feels much improved. Discussed changing from warfarin to coumadin.  Patient is a retired Banker and she has a good understanding of the differences between warfarin and Eliquis, she said she tried Xarelto once and did not like the way it made her feel however she is willing to try Eliquis.    OBJECTIVE Vitals:   12/05/19 0315 12/05/19 0330 12/05/19 0400 12/05/19 0500  BP: (!) 136/56 (!) 132/56 132/60 (!) 120/48  Pulse: 79 80 77 78  Resp: 15 19 19 18   Temp:   98 F (36.7 C)   TempSrc:   Oral   SpO2: 99% 99% 99% 100%    CBC:  Recent Labs  Lab 12/04/19 1228 12/04/19 1236  WBC 4.7  --   NEUTROABS 3.0  --   HGB 11.6* 12.6  HCT 37.0 37.0  MCV 94.9  --   PLT 220  --     Basic Metabolic Panel:  Recent Labs  Lab 12/04/19 1228 12/04/19 1236  NA 136 139  K 3.8 4.0  CL 102 100  CO2 26  --   GLUCOSE 130* 129*  BUN 11 12  CREATININE 0.83 0.70  CALCIUM 10.4*  --     Lipid Panel:     Component Value Date/Time   CHOL 119 12/05/2019 0444   TRIG 79 12/05/2019 0444   HDL 44  12/05/2019 0444   CHOLHDL 2.7 12/05/2019 0444   VLDL 16 12/05/2019 0444   LDLCALC 59 12/05/2019 0444   HgbA1c:  Lab Results  Component Value Date   HGBA1C 6.0 (H) 12/05/2019   Urine Drug Screen:     Component Value Date/Time   LABOPIA POSITIVE (A) 03/15/2018 2052   COCAINSCRNUR NONE DETECTED 03/15/2018 2052   LABBENZ NONE DETECTED 03/15/2018 2052   AMPHETMU NONE DETECTED 03/15/2018 2052   THCU NONE DETECTED 03/15/2018 2052   LABBARB NONE DETECTED 03/15/2018 2052    Alcohol Level     Component Value Date/Time   ETH <10 12/04/2019 1228    IMAGING  CT ANGIO HEAD W OR WO CONTRAST CT ANGIO NECK W OR WO CONTRAST 12/04/2019 IMPRESSION:  1. Acute occlusion of the proximal right posterior cerebral artery. MRI demonstrated small acute infarcts in right hippocampus and right occipital lobe and right parietal.  2. Mild to moderate stenosis M1 segment bilaterally. Moderate stenosis right A1 segment  3. Moderate stenosis origin of right vertebral artery. Left vertebral artery widely patent  4. Mild atherosclerotic disease internal carotid artery bilaterally without significant stenosis. Atherosclerotic calcification in the cavernous carotid  bilaterally without stenosis.   CT HEAD WO CONTRAST 12/04/2019 IMPRESSION:  1. No CT evidence for acute intracranial abnormality.  2. Atrophy and chronic small vessel ischemic change of the white matter. Chronic left parietal infarct.   CT HEAD WO CONTRAST - pending 12/05/19   MR BRAIN WO CONTRAST 12/04/2019 IMPRESSION:  Small acute infarct right occipital and right parietal lobe. Larger infarct right hippocampus 6 x 10 mm right frontal meningioma unchanged from prior study.   CT CEREBRAL PERFUSION W CONTRAST 12/04/2019 IMPRESSION:  18 mL of delayed perfusion in the right PCA territory involving the hippocampus and occipital pole. Right PCA is occluded on recent CTA. There appears to be significant collateral circulation.   CT HEAD CODE  STROKE WO CONTRAST 12/04/2019 IMPRESSION:  1. No acute abnormality.  No acute infarct or hemorrhage  2. ASPECTS is 10  3. Atrophy and chronic microvascular ischemia. Small acute infarcts identified on MRI today are not visualized on the current CT.   Neuro Interventional Radiology - Cerebral Angiogram with Intervention - de Rosario Jacks, MD 12/04/19 There was a proximal right P2/PCA occlusion. Mechanical thrombectomy performed with combined aspiration + stent retriever (embotrap) with complete recanalization (TICI 3). No embolus to new territory. No hemorrhage on post procedure flat panel CT.   Transthoracic Echocardiogram  00/00/2021 Pending   ECG - SR rate 68 BPM. (See cardiology reading for complete details)   PHYSICAL EXAM Blood pressure (!) 120/48, pulse 78, temperature 98 F (36.7 C), temperature source Oral, resp. rate 18, SpO2 100 %.  Exam: NAD, pleasant                  Speech:    Speech is normal; fluent and spontaneous with normal comprehension.  Cognition:    The patient is oriented to person, place, and time;     recent and remote memory intact;     language fluent;    Cranial Nerves:    The pupils are equal, round, and reactive to light.Trigeminal sensation is intact and the muscles of mastication are normal. The face is symmetric. The palate elevates in the midline. Hearing intact. Voice is normal. Shoulder shrug is normal. The tongue has normal motion without fasciculations.   Coordination:  No dysmetria or ataxia  Motor Observation:    No asymmetry, no atrophy, and no involuntary movements noted. Tone:    Normal muscle tone.     Strength:    Strength is V/V in the upper and lower limbs. No drift noted     Sensation: intact to LT in all limbs symmetrically    ASSESSMENT/PLAN [name] is a 85 y.o. female with history of Afib (warfarin - INR 1.5), anxiety, breast cancer, ASPVD, anal cancer, prev stroke, HTN, HLD who is a retired  Proofreader presenting with left arm 'heaviness', tongue felt numb and she became mentally cloudy. She did not receive IV t-PA due to late presentation (>4.5 hours from time of onset) and anticoagulation.  Interventional Radiology - mechanical thrombectomy proximal right P2/PCA occlusion with combined aspiration + stent retriever (embotrap) with complete recanalization (TICI 3)  Stroke: Small acute infarct right occipital and right parietal lobe. Larger infarct right hippocampus - embolic - atrial fibrillation  Resultant  resolved  Code Stroke CT Head - No acute abnormality.  No acute infarct or hemorrhage. ASPECTS is 10. Atrophy and chronic microvascular ischemia. Small acute infarcts identified on MRI today not visualized on the current CT   CT head - No CT  evidence for acute intracranial abnormality. Atrophy and chronic small vessel ischemic change of the white matter. Chronic left parietal infarct.   CT Head - repeat - pending  MRI head - Small acute infarct right occipital and right parietal lobe. Larger infarct right hippocampus. 6 x 10 mm right frontal meningioma unchanged from prior study  MRA head - not ordered  CTA H&N - Acute occlusion of the proximal right posterior cerebral artery. MRI demonstrated small acute infarcts in right hippocampus and right occipital lobe and right parietal. Mild to moderate stenosis M1 segment bilaterally. Moderate stenosis right A1 segment. Moderate stenosis origin of right vertebral artery. Left vertebral artery widely patent. Mild atherosclerotic disease internal carotid artery bilaterally without significant stenosis. Atherosclerotic calcification in the cavernous carotid bilaterally without stenosis.   CT Perfusion - 18 mL of delayed perfusion in the right PCA territory involving the hippocampus and occipital pole. Right PCA is occluded on recent CTA. There appears to be significant collateral circulation.   Carotid Doppler - CTA neck performed - carotid  dopplers not indicated.  2D Echo - pending  Lacey Jensen Virus 2 - negative  LDL - pending  HgbA1c - 6.0  UDS - pending  VTE prophylaxis - SCDs Diet  Diet Order            Diet Heart Room service appropriate? Yes; Fluid consistency: Thin  Diet effective now              aspirin 81 mg daily and warfarin daily prior to admission, now on No antithrombotic  Patient counseled to be compliant with her antithrombotic medications  Ongoing aggressive stroke risk factor management  Therapy recommendations:  pending  Disposition:  Pending  Hypertension  Home BP meds: Toprol ; Benicar  Current BP meds: Cleviprex  Stable . Permissive hypertension (OK if < 220/120) but gradually normalize in 5-7 days  . Long-term BP goal normotensive  Hyperlipidemia  Home Lipid lowering medication: Repatha (every 2 weeks)  LDL pending, goal < 70  Current lipid lowering medication: Repatha (every 2 weeks)  Intolerant to statins  Other Stroke Risk Factors  Advanced age  ETOH use, advised to drink no more than 1 alcoholic beverage per day.  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  She is a very good historian, her background in medicine makes her quite knowledgeable, we discussed switching her from warfarin to Eliquis and she agreed.  However she is within 24 hours of thrombectomy, I would hold Eliquis until next week for fear of risk of bleeding.   I spent 35 minutes of face-to-face and  non-face-to-face time with patient on the  1. Acute cardioembolic stroke ([place])   2. Stroke ([place])   3. Ischemic stroke ([place])    diagnosis.  This included previsit chart review, lab review, study review, order entry, electronic health record documentation, patient education on the different diagnostic and therapeutic options, counseling and coordination of care, risks and benefits of management, compliance, or risk factor reduction  To contact Stroke Continuity provider, please refer to [email or web address] After hours, contact General Neurology

## 2019-12-05 NOTE — Progress Notes (Addendum)
ANTICOAGULATION CONSULT NOTE - Initial Consult  Pharmacy Consult for Apixaban Indication: atrial fibrillation  Allergies  Allergen Reactions  . Epinephrine Anaphylaxis    Shortness of breath and increased heart rate  . Vytorin [Ezetimibe-Simvastatin]   . Benazepril Hcl Cough  . Crestor [Rosuvastatin Calcium] Other (See Comments)    cognitive  . Lisinopril Cough    Patient Measurements: Weight: 75.1 kg (165 lb 9.1 oz)  Vital Signs: Temp: 97.5 F (36.4 C) (05/29 0800) Temp Source: Oral (05/29 0800) BP: 122/55 (05/29 0800) Pulse Rate: 80 (05/29 0815)  Labs: Recent Labs    12/04/19 1228 12/04/19 1236  HGB 11.6* 12.6  HCT 37.0 37.0  PLT 220  --   APTT 36  --   LABPROT 17.9*  --   INR 1.5*  --   CREATININE 0.83 0.70    Estimated Creatinine Clearance: 51.7 mL/min (by C-G formula based on SCr of 0.7 mg/dL).   Medical History: Past Medical History:  Diagnosis Date  . A-fib ([place]) 03/15/2018  . Anal cancer ([place])   . Anxiety   . Arthritis   . Breast cancer ([place])   . Breast cancer ([place])   . Cancer of breast ([place])   . Depression   . Dysrhythmia 2010   a-fib  . Hyperlipidemia   . Hypertension   . PVD (peripheral vascular disease) ([place])   . RAS (renal artery stenosis) ([place])   . Stroke [facility]) 2019   no residual, x 2   Assessment: 85 yo female who came in as a CODE Stroke.  Has a history of recurrent strokes and atrial fibrillation on Coumadin PTA. INR on admission subtherapeutic at 1.5. Pharmacy consulted to initiate Apixaban therapy rather than Coumadin therapy for her atrial fibrillation.  She only meets 1 or the 3 criteria for reduced dose apixaban, so she will remain on the full dose.  Plan:  Start apixaban 5 mg po bid Monitor for signs and symptoms of bleeding  ADDENDUM: Per MD: Hold Apixaban until next week for fear of bleeding risk post-thrombectomy.  Alanda Slim, PharmD, [facility] Clinical Pharmacist Please see AMION for all Pharmacists' Contact Phone  Numbers 12/05/2019, 9:55 AM

## 2019-12-05 NOTE — Progress Notes (Signed)
SLP Cancellation Note  Patient Details Name: Jeanette [name] MRN: [id number] DOB: May 23, 1935   Cancelled treatment:       Reason Eval/Treat Not Completed: Fatigue/lethargy limiting ability to participate. RN requested holding off on SLE until next date as patient is not alert enough.  Sonia Baller, MA, CCC-SLP

## 2019-12-05 NOTE — Plan of Care (Signed)
  Problem: Ischemic Stroke/TIA Tissue Perfusion: Goal: Complications of ischemic stroke/TIA will be minimized Outcome: Progressing   

## 2019-12-05 NOTE — Evaluation (Signed)
Physical Therapy Evaluation Patient Details Name: Jeanette [name] MRN: [id number] DOB: 1935/06/18 Today's Date: 12/05/2019   History of Present Illness  85 year old female with past medical history of paroxysmal atrial fibrillation (on coumadin) , multiple strokes in the past (last 03/2018), hypertension, hyperlipidemia, diastolic congestive heart failure who presents to [facility] emergency department with complaints of arm numbness, visual field defect and slurring of speech. MRI revealed acute infarcts of R occipital lob, R parietal lobe and R hippocampus.    Clinical Impression  Pt admitted with above. Pt very sleepy. RN reports she gave her PM medicine but she was sleepy before that too. Pt unsteady on feet and requires use of RW and minA for safe ambulation. Pt was indep PTA. Suspect once pt become more alert pt will do better with mobility. Acute PT to cont to follow.    Follow Up Recommendations Home health PT;Supervision/Assistance - 24 hour    Equipment Recommendations  Rolling walker with 5" wheels    Recommendations for Other Services       Precautions / Restrictions Precautions Precautions: Fall Precaution Comments: sleepy from medications Restrictions Weight Bearing Restrictions: No      Mobility  Bed Mobility Overal bed mobility: Needs Assistance Bed Mobility: Supine to Sit     Supine to sit: Min guard     General bed mobility comments: increased time, long sit technique, used UEs to pull self to EOB, pt with report of lightheadedness, BP 130/70s,   Transfers Overall transfer level: Needs assistance Equipment used: None Transfers: Sit to/from Stand Sit to Stand: Min assist         General transfer comment: minA to power up and steady upon initial standing, pt requiring modA from lower surface height  Ambulation/Gait Ambulation/Gait assistance: Min assist Gait Distance (Feet): 120 Feet Assistive device: Rolling walker (2 wheeled) Gait  Pattern/deviations: Step-through pattern;Decreased stride length;Narrow base of support;Staggering right;Staggering left Gait velocity: slow Gait velocity interpretation: <1.31 ft/sec, indicative of household ambulator General Gait Details: initially pt ambulated with HHA to the bathroom, pt staggering and unsteady requiring minA, pt then used RW, minA for walker management but pt more steady with increased step length  Stairs            Wheelchair Mobility    Modified Rankin (Stroke Patients Only) Modified Rankin (Stroke Patients Only) Pre-Morbid Rankin Score: Slight disability Modified Rankin: Moderate disability     Balance Overall balance assessment: Needs assistance Sitting-balance support: No upper extremity supported;Feet supported Sitting balance-Leahy Scale: Fair     Standing balance support: No upper extremity supported Standing balance-Leahy Scale: Fair Standing balance comment: pt stood at sink to wash hands with min guard assist                             Pertinent Vitals/Pain Pain Assessment: 0-10 Pain Score: 8  Pain Location: headache Pain Descriptors / Indicators: Headache Pain Intervention(s): Monitored during session;Premedicated before session    [place] expects to be discharged to:: Private residence Living Arrangements: Spouse/significant other Available Help at Discharge: Family;Available 24 hours/day(husband available) Type of Home: House Home Access: Stairs to enter   [employer] of Steps: 1 Home Layout: One level Home Equipment: None      Prior Function Level of Independence: Independent         Comments: drives     Hand Dominance   Dominant Hand: Right    Extremity/Trunk Assessment  Upper Extremity Assessment Upper Extremity Assessment: Generalized weakness    Lower Extremity Assessment Lower Extremity Assessment: Generalized weakness    Cervical / Trunk Assessment Cervical  / Trunk Assessment: Normal  Communication   Communication: No difficulties(sleepy and soft spoken)  Cognition Arousal/Alertness: Lethargic;Suspect due to medications Behavior During Therapy: Flat affect Overall Cognitive Status: Impaired/Different from baseline Area of Impairment: Attention;Safety/judgement;Problem solving                   Current Attention Level: Focused(difficulty maintaining eyes open)     Safety/Judgement: Decreased awareness of deficits   Problem Solving: Slow processing;Difficulty sequencing;Requires verbal cues;Requires tactile cues General Comments: pt with difficulty keeping eyes open, however is oriented and is able to follow commands but demo's short attn span requiring freq verbal cues      General Comments General comments (skin integrity, edema, etc.): VSS    Exercises     Assessment/Plan    PT Assessment Patient needs continued PT services  PT Problem List Decreased strength;Decreased range of motion;Decreased activity tolerance;Decreased balance;Decreased mobility;Decreased coordination;Decreased cognition;Decreased knowledge of use of DME;Decreased safety awareness       PT Treatment Interventions DME instruction;Gait training;Stair training;Functional mobility training;Therapeutic activities;Therapeutic exercise;Balance training;Neuromuscular re-education    PT Goals (Current goals can be found in the Care Plan section)  Acute Rehab PT Goals PT Goal Formulation: With patient Time For Goal Achievement: 12/19/19 Potential to Achieve Goals: Good    Frequency Min 4X/week   Barriers to discharge        Co-evaluation               AM-PAC PT "6 Clicks" Mobility  Outcome Measure Help needed turning from your back to your side while in a flat bed without using bedrails?: A Little Help needed moving from lying on your back to sitting on the side of a flat bed without using bedrails?: A Little Help needed moving to and from a  bed to a chair (including a wheelchair)?: A Little Help needed standing up from a chair using your arms (e.g., wheelchair or bedside chair)?: A Little Help needed to walk in hospital room?: A Little Help needed climbing 3-5 steps with a railing? : A Little 6 Click Score: 18    End of Session Equipment Utilized During Treatment: Gait belt Activity Tolerance: Patient limited by fatigue Patient left: in bed;with call bell/phone within reach;with bed alarm set;with family/visitor present Nurse Communication: Mobility status PT Visit Diagnosis: Unsteadiness on feet (R26.81);Difficulty in walking, not elsewhere classified (R26.2)    Time: 1133-1202 PT Time Calculation (min) (ACUTE ONLY): 29 min   Charges:   PT Evaluation $PT Eval Moderate Complexity: 1 Mod PT Treatments $Gait Training: 8-22 mins        Kittie Plater, PT, DPT Acute Rehabilitation Services Pager #: [phone] Office #: [phone]   Berline Lopes 12/05/2019, 1:15 PM

## 2019-12-05 NOTE — Anesthesia Postprocedure Evaluation (Signed)
Anesthesia Post Note  Patient: Jeanette [name]  Procedure(s) Performed: IR WITH ANESTHESIA (N/A )     Patient location during evaluation: PACU Anesthesia Type: General Level of consciousness: sedated Pain management: pain level controlled Vital Signs Assessment: post-procedure vital signs reviewed and stable Respiratory status: spontaneous breathing and respiratory function stable Cardiovascular status: stable Postop Assessment: no apparent nausea or vomiting Anesthetic complications: no    Last Vitals:  Vitals:   12/05/19 0330 12/05/19 0400  BP: (!) 132/56 132/60  Pulse: 80 77  Resp: 19 19  Temp:  36.7 C  SpO2: 99% 99%    Last Pain:  Vitals:   12/05/19 0400  TempSrc: Oral  PainSc:                  [name] DANIEL

## 2019-12-05 NOTE — Progress Notes (Signed)
Pts SBP has most recently been in the 105-115 range, below target of 120-140.  Dr. Leonel Ramsay notified.  Orders given for  NS bolus.

## 2019-12-05 NOTE — Transfer of Care (Signed)
Immediate Anesthesia Transfer of Care Note  Patient: Jeanette [name]  Procedure(s) Performed: IR WITH ANESTHESIA (N/A )  Patient Location: ICU  Anesthesia Type:General  Level of Consciousness: drowsy  Airway & Oxygen Therapy: Patient Spontanous Breathing and Patient connected to face mask oxygen  Post-op Assessment: Report given to RN and Post -op Vital signs reviewed and stable  Post vital signs: Reviewed and stable  Last Vitals:  Vitals Value Taken Time  BP 148/61 12/05/19 0038  Temp    Pulse 72 12/05/19 0042  Resp 14 12/05/19 0042  SpO2 100 % 12/05/19 0042  Vitals shown include unvalidated device data.  Last Pain:  Vitals:   12/04/19 2000  TempSrc:   PainSc: 4          Complications: No apparent anesthesia complications

## 2019-12-06 ENCOUNTER — Other Ambulatory Visit: Payer: Self-pay

## 2019-12-06 ENCOUNTER — Inpatient Hospital Stay (HOSPITAL_COMMUNITY): Payer: Medicare Other

## 2019-12-06 DIAGNOSIS — I6389 Other cerebral infarction: Secondary | ICD-10-CM

## 2019-12-06 MED ORDER — APIXABAN 5 MG PO TABS: 5.0000 mg | ORAL_TABLET | Freq: Two times a day (BID) | ORAL | 1 refills | Status: DC

## 2019-12-06 MED ORDER — ONDANSETRON HCL 4 MG PO TABS
4.0000 mg | ORAL_TABLET | Freq: Four times a day (QID) | ORAL | Status: DC | PRN
  Administered 2019-12-06: 4 mg via ORAL
  Filled 2019-12-06: qty 1

## 2019-12-06 NOTE — Progress Notes (Signed)
SLP Cancellation Note  Patient Details Name: LARIE PALAS MRN: [id number] DOB: [birth year]   Cancelled treatment:       Reason Eval/Treat Not Completed: Patient at procedure or test/unavailable(Pt has d/c orders and is ambulating with therapy. SLP will f/u)  Tobie Poet I. Hardin Negus, [place], [place] Office number [phone] Pager [phone]  Horton Marshall 12/06/2019, 3:04 PM

## 2019-12-06 NOTE — Care Management (Signed)
RW taken to patient's room

## 2019-12-06 NOTE — Progress Notes (Signed)
Received the pt from 4N ICU, alert and Oriented x4, oriented to the room, implemented MD orders, call light within reach, will continue to monitor

## 2019-12-06 NOTE — Discharge Instructions (Addendum)
DISCHARGE INSTRUCTIONS 1. Gradually increase activity as tolerated 2. Drink plenty of water - avoid dehydration 3. Try to avoid sweets. Blood glucose mildly high. Discuss with Dr Joylene Draft. 4. 24 hour per day supervision initially for safety has been recommended by your therapists. 5. Home physical therapy will be scheduled.  6. 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 Opening the artery prevented further damage. 7. Try to take the Eliquis doses 12 hours apart and at about the same time each day. 8. Check your blood pressure at home and keep a record. Bring it to your next office visit with Dr Joylene Draft.   DISCHARGE DIET   Heart healthy diet wit thin liquids. Avoid concentrated sweets.   Information on my medicine - ELIQUIS (apixaban)  This medication education was reviewed with me or my healthcare representative as part of my discharge preparation.    Why was Eliquis prescribed for you? Eliquis was prescribed for you to reduce the risk of a blood clot forming that can cause a stroke if you have a medical condition called atrial fibrillation (a type of irregular heartbeat).  What do You need to know about Eliquis ? Take your Eliquis TWICE DAILY - one tablet in the morning and one tablet in the evening with or without food. If you have difficulty swallowing the tablet whole please discuss with your pharmacist how to take the medication safely.  Take Eliquis exactly as prescribed by your doctor and DO NOT stop taking Eliquis without talking to the doctor who prescribed the medication.  Stopping may increase your risk of developing a stroke.  Refill your prescription before you run out.  After discharge, you should have  regular check-up appointments with your healthcare provider that is prescribing your Eliquis.  In the future your dose may need to be changed if your kidney function or weight changes by a significant amount or as you get older.  What do you do if you miss a dose? If you miss a dose, take it as soon as you remember on the same day and resume taking twice daily.  Do not take more than one dose of ELIQUIS at the same time to make up a missed dose.  Important Safety Information A possible side effect of Eliquis is bleeding. You should call your healthcare provider right away if you experience any of the following: ? Bleeding from an injury or your nose that does not stop. ? Unusual colored urine (red or dark brown) or unusual colored stools (red or black). ? Unusual bruising for unknown reasons. ? A serious fall or if you hit your head (even if there is no bleeding).  Some medicines may interact with Eliquis and might increase your risk of bleeding or clotting while on Eliquis. To help avoid this, consult your healthcare provider or pharmacist prior to using any new prescription or non-prescription medications, including herbals, vitamins, non-steroidal anti-inflammatory drugs (NSAIDs) and supplements.  This website has more information on Eliquis (apixaban): http://www.eliquis.com/eliquis/home

## 2019-12-06 NOTE — TOC Transition Note (Signed)
Transition of Care [facility]) - CM/SW Discharge Note   Patient Details  Name: Jeanette [name] MRN: [id number] Date of Birth: 1935-05-14  [facility] CM/SW Contact:  Carles Collet, RN Phone Number: 12/06/2019, 2:03 PM   Clinical Narrative:    Patient provided with Eliquis 30 day card. [place] services arranged w Alvis Lemmings.    Final next level of care: [place] Barriers to Discharge: No Barriers Identified   Patient Goals and CMS Choice Patient states their goals for this hospitalization and ongoing recovery are:: to go home CMS Medicare.gov Compare Post Acute Care list provided to:: Patient Choice offered to / list presented to : Patient  Discharge Placement                       Discharge Plan and Services                          HH Arranged: PT, OT [facility] Agency: [place] Date [place]: 12/06/19 Time [place]: [phone] Representative spoke with at [place]: [place] ([place]) Interventions     Readmission Risk Interventions No flowsheet data found.

## 2019-12-06 NOTE — Progress Notes (Signed)
Echocardiogram 2D Echocardiogram has been performed.  Jeanette [name] 12/06/2019, 10:17 AM

## 2019-12-06 NOTE — Discharge Summary (Addendum)
Patient ID: Jeanette [name]   MRN: [id number]      DOB: 10-17-1934  Date of Admission: 12/04/2019 Date of Discharge: 12/06/2019  Attending Physician:  Rosalin Hawking, MD, Stroke MD Consultant(s):    None  Patient's PCP:  Crist Infante, MD  DISCHARGE DIAGNOSIS:   Small acute infarct right occipital and right parietal lobe. Larger infarct right hippocampus  Interventional Radiology - mechanical thrombectomy proximal right P2/PCA occlusion with combined aspiration + stent retriever (embotrap) with complete recanalization (TICI 3)  Hyperglycemia   Ischemic stroke ([place])  Essential hypertension  Paroxysmal atrial fibrillation ([facility])  Mixed hyperlipidemia  Chronic diastolic CHF (congestive heart failure) ([place])  Vertebral artery stenosis with cerebral infarction ([place])  Stroke (cerebrum) ([place])  Eliquis therapy   Past Medical History:  Diagnosis Date  . A-fib ([place]) 03/15/2018  . Anal cancer ([place])   . Anxiety   . Arthritis   . Breast cancer ([place])   . Breast cancer ([place])   . Cancer of breast ([place])   . Depression   . Dysrhythmia 2010   a-fib  . Hyperlipidemia   . Hypertension   . PVD (peripheral vascular disease) ([place])   . RAS (renal artery stenosis) ([place])   . Stroke ([place]) 2019   no residual, x 2   Past Surgical History:  Procedure Laterality Date  . BREAST BIOPSY Left 01/14/2003  . BREAST LUMPECTOMY Right 1989  . CARDIOVASCULAR STRESS TEST  07/27/2003   No evidence if LV myocardial ischemia or scar. LV EF 74%.  Marland Kitchen GYNECOLOGIC CRYOSURGERY    . OPEN REDUCTION INTERNAL FIXATION (ORIF) DISTAL RADIAL FRACTURE Left 02/19/2017   Procedure: OPEN REDUCTION INTERNAL FIXATION (ORIF) LEFT DISTAL RADIAL FRACTURE;  Surgeon: Leanora Cover, MD;  Location: [facility];  Service: Orthopedics;  Laterality: Left;  . OVARIAN CYST REMOVAL    . RENAL DOPPLER  09/09/2012   Right proximal renal artery: 60-99% diameter reduction. Normal patency of the left main renal artery.   .  TRANSTHORACIC ECHOCARDIOGRAM  03/22/2006   EF 55-60%, mild mitral valvular regurg, pulmonary artery systolic pressure was moderately increased.  . TUBAL LIGATION      Family History Family History  Problem Relation Age of Onset  . Alzheimer's disease Mother   . Pneumonia Mother   . Heart failure Father   . Heart disease Sister   . Heart disease Brother   . Heart disease Brother   . Heart disease Sister   . Pneumonia Sister     Social History  reports that she has never smoked. She has never used smokeless tobacco. She reports current alcohol use of about 2.0 standard drinks of alcohol per week. She reports that she does not use drugs.  Allergies as of 12/06/2019       Reactions   Epinephrine Anaphylaxis   Shortness of breath and increased heart rate   Vytorin [ezetimibe-simvastatin]    Benazepril Hcl Cough   Crestor [rosuvastatin Calcium] Other (See Comments)   cognitive   Lisinopril Cough        Medication List     STOP taking these medications    Aspirin Low Dose 81 MG EC tablet Generic drug: aspirin   warfarin 5 MG tablet Commonly known as: COUMADIN       TAKE these medications    apixaban 5 MG Tabs tablet Commonly known as: ELIQUIS Take 1 tablet (5 mg total) by mouth 2 (two) times daily.   cholecalciferol 1000 units tablet Commonly known  as: VITAMIN D Take 1,000 Units by mouth daily.   COQ10 PO Take 1 capsule by mouth daily.   escitalopram 10 MG tablet Commonly known as: LEXAPRO Take 10 mg by mouth daily.   loratadine 10 MG tablet Commonly known as: CLARITIN Take 10 mg by mouth daily as needed for allergies.   metoprolol 200 MG 24 hr tablet Commonly known as: TOPROL-XL Take 100 mg by mouth 2 (two) times daily.   nitroGLYCERIN 0.4 MG SL tablet Commonly known as: NITROSTAT PLACE 1 TABLET (0.4 MG TOTAL) UNDER THE TONGUE EVERY 5 (FIVE) MINUTES AS NEEDED FOR CHEST PAIN.   olmesartan 20 MG tablet Commonly known as: BENICAR Take 10 mg by  mouth daily.   Repatha SureClick [id number] MG/ML [place] Generic drug: Evolocumab Inject 140 mg into the skin See admin instructions. Take 140mg  SQ every two weeks per patient   vitamin B-12 100 MCG tablet Commonly known as: CYANOCOBALAMIN Take 50 mcg by mouth daily.        HOME MEDICATIONS PRIOR TO ADMISSION Medications Prior to Admission  Medication Sig Dispense Refill  . cholecalciferol (VITAMIN D) 1000 units tablet Take 1,000 Units by mouth daily.    . Coenzyme Q10 (COQ10 PO) Take 1 capsule by mouth daily.    Marland Kitchen escitalopram (LEXAPRO) 10 MG tablet Take 10 mg by mouth daily.      Marland Kitchen loratadine (CLARITIN) 10 MG tablet Take 10 mg by mouth daily as needed for allergies.    . metoprolol (TOPROL-XL) 200 MG 24 hr tablet Take 100 mg by mouth 2 (two) times daily.     Marland Kitchen olmesartan (BENICAR) 20 MG tablet Take 10 mg by mouth daily.     Marland Kitchen REPATHA SURECLICK [id number] MG/ML SOAJ Inject 140 mg into the skin See admin instructions. Take 140mg  SQ every two weeks per patient    . vitamin B-12 (CYANOCOBALAMIN) 100 MCG tablet Take 50 mcg by mouth daily.      Marland Kitchen warfarin (COUMADIN) 5 MG tablet Take 2.5 mg by mouth every evening. Take 5mg  on Mon, Wed and Fri. Take 2.5mg  tablet all other days    . ASPIRIN LOW DOSE 81 MG EC tablet TAKE 1 TABLET BY MOUTH EVERY DAY (Patient not taking: Reported on 12/04/2019) 90 tablet 1  . nitroGLYCERIN (NITROSTAT) 0.4 MG SL tablet PLACE 1 TABLET (0.4 MG TOTAL) UNDER THE TONGUE EVERY 5 (FIVE) MINUTES AS NEEDED FOR CHEST PAIN. 75 tablet 1     HOSPITAL MEDICATIONS . apixaban  5 mg Oral BID  . chlorhexidine  15 mL Mouth Rinse BID  . Chlorhexidine Gluconate Cloth  6 each Topical Daily  . cholecalciferol  1,000 Units Oral Daily  . escitalopram  10 mg Oral Daily  . pantoprazole  20 mg Oral Daily    LABORATORY STUDIES CBC    Component Value Date/Time   WBC 4.7 12/04/2019 1228   RBC 3.90 12/04/2019 1228   HGB 12.6 12/04/2019 1236   HCT 37.0 12/04/2019 1236   PLT 220 12/04/2019 1228    MCV 94.9 12/04/2019 1228   MCH 29.7 12/04/2019 1228   MCHC 31.4 12/04/2019 1228   RDW 15.7 (H) 12/04/2019 1228   LYMPHSABS 0.9 12/04/2019 1228   MONOABS 0.7 12/04/2019 1228   EOSABS 0.1 12/04/2019 1228   BASOSABS 0.0 12/04/2019 1228   CMP    Component Value Date/Time   NA 139 12/04/2019 1236   K 4.0 12/04/2019 1236   CL 100 12/04/2019 1236   CO2 26 12/04/2019 1228  GLUCOSE 129 (H) 12/04/2019 1236   BUN 12 12/04/2019 1236   CREATININE 0.70 12/04/2019 1236   CALCIUM 10.4 (H) 12/04/2019 1228   PROT 6.7 12/04/2019 1228   ALBUMIN 3.7 12/04/2019 1228   AST 21 12/04/2019 1228   ALT 14 12/04/2019 1228   ALKPHOS 56 12/04/2019 1228   BILITOT 0.6 12/04/2019 1228   GFRNONAA >60 12/04/2019 1228   GFRAA >60 12/04/2019 1228   COAGS Lab Results  Component Value Date   INR 1.5 (H) 12/04/2019   INR 2.1 (H) 09/09/2018   INR 1.43 03/17/2018   Lipid Panel    Component Value Date/Time   CHOL 119 12/05/2019 0444   TRIG 79 12/05/2019 0444   HDL 44 12/05/2019 0444   CHOLHDL 2.7 12/05/2019 0444   VLDL 16 12/05/2019 0444   LDLCALC 59 12/05/2019 0444   HgbA1C  Lab Results  Component Value Date   HGBA1C 6.0 (H) 12/05/2019   Urinalysis    Component Value Date/Time   COLORURINE YELLOW 12/05/2019 0539   APPEARANCEUR CLEAR 12/05/2019 0539   LABSPEC >1.046 (H) 12/05/2019 0539   PHURINE 7.0 12/05/2019 0539   GLUCOSEU NEGATIVE 12/05/2019 0539   HGBUR NEGATIVE 12/05/2019 0539   BILIRUBINUR NEGATIVE 12/05/2019 0539   KETONESUR 5 (A) 12/05/2019 0539   PROTEINUR NEGATIVE 12/05/2019 0539   UROBILINOGEN 0.2 03/14/2009 1400   NITRITE NEGATIVE 12/05/2019 0539   LEUKOCYTESUR TRACE (A) 12/05/2019 0539   Urine Drug Screen     Component Value Date/Time   LABOPIA POSITIVE (A) 12/05/2019 0538   COCAINSCRNUR NONE DETECTED 12/05/2019 0538   LABBENZ NONE DETECTED 12/05/2019 0538   AMPHETMU NONE DETECTED 12/05/2019 0538   THCU NONE DETECTED 12/05/2019 0538   LABBARB NONE DETECTED 12/05/2019  0538    Alcohol Level    Component Value Date/Time   ETH <10 12/04/2019 1228     SIGNIFICANT DIAGNOSTIC STUDIES  CT ANGIO HEAD W OR WO CONTRAST CT ANGIO NECK W OR WO CONTRAST 12/04/2019 IMPRESSION:  1. Acute occlusion of the proximal right posterior cerebral artery. MRI demonstrated small acute infarcts in right hippocampus and right occipital lobe and right parietal.  2. Mild to moderate stenosis M1 segment bilaterally. Moderate stenosis right A1 segment  3. Moderate stenosis origin of right vertebral artery. Left vertebral artery widely patent  4. Mild atherosclerotic disease internal carotid artery bilaterally without significant stenosis. Atherosclerotic calcification in the cavernous carotid bilaterally without stenosis.    CT HEAD WO CONTRAST 12/04/2019 IMPRESSION:  1. No CT evidence for acute intracranial abnormality.  2. Atrophy and chronic small vessel ischemic change of the white matter. Chronic left parietal infarct.    CT HEAD WO CONTRAST  12/05/19 IMPRESSION: No change from yesterday.  No acute infarct no acute hemorrhage.   MR BRAIN WO CONTRAST 12/04/2019 IMPRESSION:  Small acute infarct right occipital and right parietal lobe. Larger infarct right hippocampus 6 x 10 mm right frontal meningioma unchanged from prior study.    CT CEREBRAL PERFUSION W CONTRAST 12/04/2019 IMPRESSION:  18 mL of delayed perfusion in the right PCA territory involving the hippocampus and occipital pole. Right PCA is occluded on recent CTA. There appears to be significant collateral circulation.    CT HEAD CODE STROKE WO CONTRAST 12/04/2019 IMPRESSION:  1. No acute abnormality.  No acute infarct or hemorrhage  2. ASPECTS is 10  3. Atrophy and chronic microvascular ischemia. Small acute infarcts identified on MRI today are not visualized on the current CT.    Neuro Interventional Radiology -  Cerebral Angiogram with Intervention - de Rosario Jacks, MD 12/04/19 There was a  proximal right P2/PCA occlusion. Mechanical thrombectomy performed with combined aspiration + stent retriever (embotrap) with complete recanalization (TICI 3). No embolus to new territory. No hemorrhage on post procedure flat panel CT.  Transthoracic Echocardiogram  12/06/19 IMPRESSIONS   1. Left ventricular ejection fraction, by estimation, is 65 to 70%. The  left ventricle has normal function. Left ventricular endocardial border  not optimally defined to evaluate regional wall motion. Left ventricular  diastolic parameters were normal.   2. Right ventricular systolic function is normal. The right ventricular  size is normal. There is normal pulmonary artery systolic pressure. The  estimated right ventricular systolic pressure is [id number] mmHg.   3. Left atrial size was mildly dilated.   4. The mitral valve is grossly normal. Trivial mitral valve  regurgitation.   5. The aortic valve is tricuspid. Aortic valve regurgitation is not  visualized. Mild aortic valve sclerosis is present, with no evidence of  aortic valve stenosis.   6. The inferior vena cava is normal in size with greater than 50%  respiratory variability, suggesting right atrial pressure of 3 mmHg.   HISTORY OF PRESENT ILLNESS (From Dr Aroor's H&P on 12/04/19) Jeanette [name] is an 85 y.o. 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 She is on Coumadin for Afib and reports that recently her INR was elevated and she held coumadin, however today it is 1.5. She reports that after having breakfast yesterday am, she had sudden onset of left arm 'heaviness', tongue felt numb and she became mentally cloudy. She took a nap thinking that would help, but it did not. When symptoms persisted today, she sought medical attention.  Date last known well: 12/04/19 Time last known well: 10:30am tPA Given: no, outside of time window  HOSPITAL COURSE Ms.  LILEY [name] is a 85 y.o. female with history of Afib (warfarin - INR 1.5), anxiety, breast cancer, ASPVD, anal cancer, prev stroke, HTN, HLD who is a retired Proofreader presenting with left arm 'heaviness', tongue felt numb and she became mentally cloudy. She did not receive IV t-PA due to late presentation (>4.5 hours from time of onset) and anticoagulation.  Interventional Radiology - mechanical thrombectomy proximal right P2/PCA occlusion with combined aspiration + stent retriever (embotrap) with complete recanalization (TICI 3)   Stroke: Small acute infarct right occipital and right parietal lobe. Larger infarct right hippocampus - embolic - atrial fibrillation  Resultant  resolved  Code Stroke CT Head - No acute abnormality.  No acute infarct or hemorrhage. ASPECTS is 10. Atrophy and chronic microvascular ischemia. Small acute infarcts identified on MRI today not visualized on the current CT   CT head - No CT evidence for acute intracranial abnormality. Atrophy and chronic small vessel ischemic change of the white matter. Chronic left parietal infarct.   CT Head - No change from yesterday.  No acute infarct no acute hemorrhage.  MRI head - Small acute infarct right occipital and right parietal lobe. Larger infarct right hippocampus. 6 x 10 mm right frontal meningioma unchanged from prior study  MRA head - not ordered  CTA H&N - Acute occlusion of the proximal right posterior cerebral artery. MRI demonstrated small acute infarcts in right hippocampus and right occipital lobe and right parietal. Mild to moderate stenosis M1 segment bilaterally. Moderate stenosis right A1  segment. Moderate stenosis origin of right vertebral artery. Left vertebral artery widely patent. Mild atherosclerotic disease internal carotid artery bilaterally without significant stenosis. Atherosclerotic calcification in the cavernous carotid bilaterally without stenosis.   CT Perfusion - 18 mL of delayed perfusion in the  right PCA territory involving the hippocampus and occipital pole. Right PCA is occluded on recent CTA. There appears to be significant collateral circulation.   Carotid Doppler - CTA neck performed - carotid dopplers not indicated.  2D Echo - EF 65 - 70%. No cardiac source of emboli identified.   Sars Corona Virus 2 - negative  LDL - 59  HgbA1c - 6.0  UDS - opiates  VTE prophylaxis - SCDs  Diet Heart Room service appropriate? Yes with Assist; Fluid consistency: Thin  Aspirin 81 mg daily and warfarin daily prior to admission, now on Eliquis  Patient counseled to be compliant with her antithrombotic medications  Ongoing aggressive stroke risk factor management  Therapy recommendations:  HH PT - OT eval pending  Disposition:  Discharge to home with husband   Hypertension  Home BP meds: Toprol ; Benicar  Current BP meds: Cleviprex  Stable  Permissive hypertension (OK if < 220/120) but gradually normalize in 5-7 days   Long-term BP goal normotensive   Hyperlipidemia  Home Lipid lowering medication: Repatha (every 2 weeks)  LDL 59, goal < 70  Current lipid lowering medication: Repatha (every 2 weeks)  Intolerant to statins   Other Stroke Risk Factors  Advanced age  ETOH use, advised to drink no more than 1 alcoholic beverage per day.  Obesity, Body mass index is 29.29 kg/m., recommend weight loss, diet and exercise as appropriate   Hx stroke/TIA  Atrial fibrillation   Other Active Problems  Code status - Full code  Hyperglycemia     DISCHARGE EXAM Vitals:   12/06/19 0400 12/06/19 0439 12/06/19 0818 12/06/19 1227  BP:   (!) 148/69 127/66  Pulse: (!) 32  78 81  Resp: 17  18 17   Temp: 98.8 F (37.1 C)  99 F (37.2 C) 98.2 F (36.8 C)  TempSrc: Oral  Axillary Oral  SpO2: 99%  96% 97%  Weight:  77.4 kg    Height:  5\' 4"  (1.626 m)     Exam: NAD, pleasant                  Speech:    Speech is normal; fluent and spontaneous with normal  comprehension.  Cognition:    The patient is oriented to person, place, and time;     recent and remote memory intact;     language fluent;    Cranial Nerves:    The pupils are equal, round, and reactive to light. Visual fields intact to confrontation but slight difficulty right upper field. Trigeminal sensation is intact and the muscles of mastication are normal. The face is symmetric. The palate elevates in the midline. Hearing intact. Voice is normal. Shoulder shrug is normal. The tongue has normal motion without fasciculations.    Coordination:  No dysmetria or ataxia   Motor Observation:    No asymmetry, no atrophy, and no involuntary movements noted. Tone:    Normal muscle tone.     Strength:    Strength is V/V in the upper and lower limbs. No drift noted     Sensation: intact to LT in all limbs symmetrically  Gait: Patient is guarded and slow with a walker, she cannot walk independently without assistance or device.  DISCHARGE INSTRUCTIONS TO PATIENT 1. Gradually increase activity as tolerated 2. Drink plenty of water - avoid dehydration 3. Try to avoid sweets. Blood glucose mildly high. Discuss with Dr Joylene Draft. 4. 24 hour per day supervision initially for safety has been recommended by your therapists. 5. Home physical therapy will be scheduled.  6. 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 Opening the artery prevented further damage. 7. Try to take your Eliquis doses 12 hours apart and at the same time each day. 8. Check your blood pressure, keep a record and have Dr Joylene Draft review.  DISCHARGE DIET   Heart healthy diet wit thin liquids. Avoid concentrated sweets.   DISCHARGE PLAN  Disposition:   Discharged to home.  Eliquis (apixaban) daily for secondary stroke prevention.  Ongoing risk factor control by Primary Care Physician at time of discharge  Follow-up Crist Infante, MD in 2 weeks.  Follow-up in [facility] in 4 weeks, office to schedule an appointment.   Husband and Physical therapy at patient bedside with Dr. Jaynee Eagles. We walked patient around the hallway with her husband, she had imbalance and cannot ambulate without a walker and needed assistance. We recommended patient stay for another night until more steady and consider possibly CIR. I discussed I was concerned about her imbalance and would prefer her to stay. Physical Therapy also concerned but patient could go home with home therapy as long as she was monitored 24x7 and did not get up or ambulate independently. Husband and wife declined multiple time to stay inpatient several more days and then consider CIR, they both insist on goin ghome today. Discussed fall risks.   Discussed discharge plan with husband and patient, especially reminder to stop Warfarin as we have changed to Eliquis.  45 minutes were spent preparing discharge.  Mikey Bussing PA-C [facility] Pager [phone] 12/06/2019, 2:03 PM    Husband and Physical therapy at patient bedside with Dr. Jaynee Eagles. We walked patient around the hallway with her husband, she had imbalance and cannot ambulate without a walker and needed assistance. We recommended patient stay for another night until more steady and consider possibly CIR. I discussed I was concerned about her imbalance and would prefer her to stay. Physical Therapy also concerned but patient could go home with home therapy as long as she was monitored 24x7 and did not get up or ambulate independently. Husband and wife declined multiple time to stay inpatient several more days and then consider CIR, they both insist on goin ghome today. Discussed fall risks.    Discussed discharge plan with husband and patient, especially reminder to stop Warfarin as we have changed to Eliquis.  Personally examined patient and images, and have participated in and made any corrections needed to history, physical, neuro exam,assessment and plan as stated above.  I have personally obtained the history, evaluated lab date, reviewed imaging studies and agree with radiology interpretations.    Sarina Ill, MD Stroke Neurology   A total of 35 minutes was spent for the care of this patient, spent on counseling patient and family on different diagnostic and therapeutic options, counseling and coordination of care, riskd ans benefits of management, compliance, or risk factor reduction and education.

## 2019-12-06 NOTE — Progress Notes (Signed)
Patient being discharged home, transportation provided by spouse. Educated patient on discharge instructions. Clothing returned to patient

## 2019-12-06 NOTE — Progress Notes (Signed)
Physical Therapy Treatment Patient Details Name: ADANELLY ALTMAN MRN: [id number] DOB: 1935-02-12 Today's Date: 12/06/2019    History of Present Illness 85 year old female with past medical history of paroxysmal atrial fibrillation (on coumadin) , multiple strokes in the past (last 03/2018), hypertension, hyperlipidemia, diastolic congestive heart failure who presents to [facility] emergency department with complaints of arm numbness, visual field defect and slurring of speech. MRI revealed acute infarcts of R occipital lob, R parietal lobe and R hippocampus.    PT Comments    Patient more alert, however very distracted by the changes in her vision ("holes in my vision... it's like things are rounded", also describes seeing writing on her door where there was none and seeing "lots of things on that wall." Balance more impaired today, likely due to awareness of decr vision. Patient currently requires hands-on min assist for all mobility to prevent fall. ? If husband can provide this level of assistance (no family present today). May need to consider CIR prior to discharge home. Await OT input.    Follow Up Recommendations  Supervision/Assistance - 24 hour;CIR;Home health PT(home if husband can manage at min assist level)     Equipment Recommendations  Rolling walker with 5" wheels    Recommendations for Other Services OT consult     Precautions / Restrictions Precautions Precautions: Fall Restrictions Weight Bearing Restrictions: No    Mobility  Bed Mobility Overal bed mobility: Needs Assistance Bed Mobility: Supine to Sit     Supine to sit: Supervision;HOB elevated     General bed mobility comments: needing to go to the bathroom, moving normal pace  Transfers Overall transfer level: Needs assistance Equipment used: None;Rolling walker (2 wheeled) Transfers: Sit to/from Stand Sit to Stand: Min assist         General transfer comment: vc for safe use of RW/hand  placement; steadying assist as stepping backwards toward chair to sit down  Ambulation/Gait Ambulation/Gait assistance: Min assist Gait Distance (Feet): 90 Feet Assistive device: Rolling walker (2 wheeled);None Gait Pattern/deviations: Step-through pattern;Decreased stride length;Narrow base of support;Drifts right/left;Staggering left Gait velocity: slow   General Gait Details: initially pt ambulated no device to the bathroom, pt staggering x 1 and requiring minA, pt then used RW, minA for walker management and verbal cues to attend to objects on her left (pt brushed knuckles up against handrail x1 and very close to other objects x 3); pt distracted by the changes in her vision (likely accounting for decr balance) describes seeing writing on the walls (that is not there) and "holes in my vision"   Stairs             Wheelchair Mobility    Modified Rankin (Stroke Patients Only) Modified Rankin (Stroke Patients Only) Pre-Morbid Rankin Score: Slight disability Modified Rankin: Moderately severe disability     Balance Overall balance assessment: Needs assistance Sitting-balance support: No upper extremity supported;Feet supported Sitting balance-Leahy Scale: Fair     Standing balance support: No upper extremity supported Standing balance-Leahy Scale: Fair Standing balance comment: pt stood at sink to wash hands without imbalance (pelvis not against counter), however with turn to look for her mask, stagger step                            Cognition Arousal/Alertness: Awake/alert Behavior During Therapy: Flat affect Overall Cognitive Status: Impaired/Different from baseline Area of Impairment: Attention;Problem solving;Memory;Awareness  Current Attention Level: Selective(attending to PT instructions in crowded hallway) Memory: Decreased short-term memory(repeating herself "why is there so much stuff on that wall')   Safety/Judgement:  Decreased awareness of deficits Awareness: Emergent Problem Solving: Slow processing;Difficulty sequencing;Requires verbal cues;Requires tactile cues(after donning second gown in standing, sat down before walki) General Comments: much more alert, but states she feels "foggy" with some memory issues (perseverated for some time on recalling the medicine she takes at home for controlling afib);       Exercises      General Comments General comments (skin integrity, edema, etc.): pt denies double vision; difficulty seeing things in left field of vision; worked on looking for things on her left in her room and would need incr cues to find her mask and later her call bell; see gait re: vision changes      Pertinent Vitals/Pain Pain Assessment: No/denies pain    Home Living Family/patient expects to be discharged to:: Private residence Living Arrangements: Spouse/significant other Available Help at Discharge: Family;Available 24 hours/day(husband available) Type of Home: House Home Access: Stairs to enter   Home Layout: One level Home Equipment: None      Prior Function Level of Independence: Independent      Comments: drives   PT Goals (current goals can now be found in the care plan section) Acute Rehab PT Goals Patient Stated Goal: to go home Time For Goal Achievement: 12/19/19 Potential to Achieve Goals: Good Progress towards PT goals: Progressing toward goals    Frequency    Min 4X/week      PT Plan Discharge plan needs to be updated    Co-evaluation              AM-PAC PT "6 Clicks" Mobility   Outcome Measure  Help needed turning from your back to your side while in a flat bed without using bedrails?: None Help needed moving from lying on your back to sitting on the side of a flat bed without using bedrails?: A Little Help needed moving to and from a bed to a chair (including a wheelchair)?: A Little Help needed standing up from a chair using your arms  (e.g., wheelchair or bedside chair)?: A Little Help needed to walk in hospital room?: A Little Help needed climbing 3-5 steps with a railing? : A Little 6 Click Score: 19    End of Session Equipment Utilized During Treatment: Gait belt Activity Tolerance: Patient limited by fatigue Patient left: with call bell/phone within reach;in chair;with chair alarm set Nurse Communication: Mobility status PT Visit Diagnosis: Unsteadiness on feet (R26.81);Difficulty in walking, not elsewhere classified (R26.2)     Time: [id number] PT Time Calculation (min) (ACUTE ONLY): 44 min  Charges:  $Gait Training: 8-22 mins $Therapeutic Activity: 23-37 mins                      Arby Barrette, PT Pager [phone]    Rexanne Mano 12/06/2019, 1:40 PM

## 2019-12-07 NOTE — Progress Notes (Addendum)
Occupational Therapy Evaluation (late entry)  Pt admitted with the below listed diagnosis and the below listed deficits. She demonstrates significant visual deficits as well as cognitive deficits.  Overall, she requires set up to mod A for ADLs and min A for functional mobility.  She lives with her spouse and was fully independent PTA, but reports decreased activity tolerance since having COVID.  Spouse present mid session.  OT and MD discussed options with pt and spouse as she would benefit from post acute rehab due to need for direct 24 hour assist at home.  After discussion pt and spouse decided that they would prefer home discharge.  Recommend HHOT.     12/06/19 1530  OT Visit Information  Last OT Received On 12/07/19  Assistance Needed +1  History of Present Illness 85 year old female with past medical history of paroxysmal atrial fibrillation (on coumadin) , multiple strokes in the past (last 03/2018), hypertension, hyperlipidemia, diastolic congestive heart failure who presents to [facility] emergency department with complaints of arm numbness, visual field defect and slurring of speech. MRI revealed acute infarcts of R occipital lob, R parietal lobe and R hippocampus.  Precautions  Precautions Fall  Precaution Comments significant visual deficits   Home Living  Family/patient expects to be discharged to: Private residence  Living Arrangements Spouse/significant other  Available Help at Discharge Family;Available 24 hours/day  Type of [place] to enter  Entrance Stairs-Number of Steps 1  Home Layout One level  Bathroom Shower/Tub Walk-in shower  [place] seat  Prior Function  Level of Independence Independent  Comments drives.  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  She requires cues for safety and problem solving   Upper Extremity Assessment  Upper Extremity Assessment Generalized weakness  Lower Extremity Assessment  Lower Extremity Assessment Defer to PT evaluation  Cervical / Trunk Assessment  Cervical / Trunk Assessment Normal  ADL  Overall ADL's  Needs assistance/impaired  Eating/Feeding Supervision/ safety;Sitting  Grooming Wash/dry hands;Wash/dry face;Oral care;Brushing hair;Minimal assistance;Standing  Upper Body Bathing Supervision/ safety;Set up;Sitting  Lower Body Bathing Min guard;Sit to/from stand  Upper Body Dressing  Minimal assistance;Sitting  Lower Body Dressing Moderate assistance;Sit to/from Retail buyer Minimal assistance;Ambulation;Comfort height toilet;RW  Toileting- Clothing Manipulation and Hygiene Minimal assistance;Cueing for safety;Sit to/from Education officer, community Details (indicate cue type and reason) instructed pt and spouse that she needs direct supervision with showering  Functional mobility during ADLs Minimal assistance;Rolling walker  General ADL Comments Spouse present mid visit as well as MD.  discussed need for direct assist with ADLs and anytime she is up moving around.  Instructed them both to use a gait belt when pt ambulating, and to sit to shower   Vision- History  Baseline Vision/History Wears glasses  Wears Glasses At all times  Patient Visual Report Other (comment) (Pt seeing people, lights and images in Lt  visual field )  Vision- Assessment  Vision Assessment? Yes  Eye Alignment [facility]  Ocular Range of Motion [facility]  Alignment/Gaze Preference WDL  Tracking/Visual Pursuits Other (comment)  Visual Fields Other (comment)  Additional Comments Pt occasionally looses fixation on Lt during pursuits.  She was able to correctly identfy object in both visual fields during confrontation testing, but was delayed on the Lt, and did demonstrate difficulty counting fingers on the Lt with OT. She reports people on her left and frequently looking over left shoulder, and complains of room being very visually cluttered.  She was able to read the clock on the wall and with effort, her communication board on the wall.  during functional mobility, she tends to look to the floor and does not scan her environment efficiently  Perception  Perception Tested? Yes  Perception Deficits Figure ground;Spatial orientation  Praxis  Praxis tested? WFL  Bed Mobility  General bed mobility comments sitting up in the chair   Transfers  Overall transfer level Needs assistance  Equipment used 1 person hand held assist;Rolling walker (2 wheeled)  Written Expression  Dominant Hand Right    12/06/19 1530  Transfers  Overall transfer level Needs assistance  Equipment used 1 person hand held assist;Rolling walker (2 wheeled)  Transfers Sit to/from [facility]  Sit to Stand Min assist  Stand pivot transfers Min assist  General transfer comment min A to steady and verbal cues for safety and sequencing   General Comments  General comments (skin integrity, edema, etc.) extensive discussion with pt and spouse and MD re: need for direct 24 hour assist at discharge vs. rehab.  They both decided upon home discharge     12/06/19 1530  General Comments  General comments (skin integrity, edema, etc.) extensive discussion with pt and spouse and MD re: need for direct 24 hour assist at discharge vs. rehab.  They both decided upon  home discharge   OT - End of Session  Equipment Utilized During Treatment Gait belt;Rolling walker  Activity Tolerance Patient limited by fatigue  Patient left in bed;with call bell/phone within reach;with family/visitor present  OT Assessment/Plan  OT Visit Diagnosis Unsteadiness on feet (R26.81);Low vision, both eyes (H54.2)  OT Frequency (ACUTE ONLY) Min 2X/week  Follow Up Recommendations Home health OT;Supervision/Assistance - 24 hour  OT Equipment None recommended by OT  AM-PAC OT "6 Clicks" Daily Activity Outcome Measure (Version 2)  Help from another person eating meals? 4  Help from another person taking care of personal grooming? 3  Help from another person toileting, which includes using toliet, bedpan, or urinal? 3  Help from another person bathing (including washing, rinsing, drying)? 3  Help from another person to put on and taking off regular upper body clothing? 3  Help from another person to put on and taking off regular lower body clothing? 2  6 Click Score 18  Acute Rehab OT Goals  Patient Stated Goal for my vision to get better   OT Goal Formulation With patient/family  Time For Goal Achievement 12/09/19  Potential to Achieve Goals Good    12/06/19 1600  OT Time Calculation  OT Start Time (ACUTE ONLY) 1420  OT Stop Time (ACUTE ONLY) 1502  OT Time Calculation (min) 42 min  OT General Charges  $OT Visit 1 Visit  OT Evaluation  $OT Eval Moderate Complexity 1 Mod  OT Treatments  $Therapeutic Activity 23-37 mins  Nilsa Nutting., OTR/L Acute Rehabilitation Services Pager [phone] Office [phone]

## 2019-12-07 NOTE — Progress Notes (Signed)
Occupational Therapy Progress Note (late entry)  Education completed with pt and spouse.  Handout provided re: safety tips for home.   Pt able to perform ADLs with supervision to min A.  Recommend HHOT.     12/06/19 1600  OT Visit Information  Last OT Received On 12/07/19  Assistance Needed +1  History of Present Illness 85 year old female with past medical history of paroxysmal atrial fibrillation (on coumadin) , multiple strokes in the past (last 03/2018), hypertension, hyperlipidemia, diastolic congestive heart failure who presents to [facility] emergency department with complaints of arm numbness, visual field defect and slurring of speech. MRI revealed acute infarcts of R occipital lob, R parietal lobe and R hippocampus.  Precautions  Precautions Fall  Precaution Comments significant visual deficits   Pain Assessment  Pain Assessment No/denies pain  Cognition  Arousal/Alertness Awake/alert  Behavior During Therapy WFL for tasks assessed/performed  Overall Cognitive Status Impaired/Different from baseline  Area of Impairment Attention;Following commands;Safety/judgement;Awareness;Problem solving  Current Attention Level Selective  Safety/Judgement Decreased awareness of safety;Decreased awareness of deficits  Awareness Emergent  Problem Solving Slow processing;Difficulty sequencing;Requires verbal cues;Requires tactile cues  General Comments Pt distracted by altered visual input.  She requires cues for safety and problem solving   Upper Extremity Assessment  Upper Extremity Assessment Generalized weakness  Lower Extremity Assessment  Lower Extremity Assessment Defer to PT evaluation  ADL  Overall ADL's  Needs assistance/impaired  Eating/Feeding Supervision/ safety;Sitting  Upper Body Dressing  Supervision/safety;Sitting  Upper Body Dressing Details (indicate cue type and reason) required increased time and trial and error to don bra   Lower Body Dressing Minimal  assistance;Sit to/from stand  Lower Body Dressing Details (indicate cue type and reason) Pt unable to identify her shorts as shorts thinking they were a skirt and repeatedly attempted to don both legs in the same pant leg.  She required assist to correct   Bed Mobility  Overal bed mobility Modified Independent  Balance  Overall balance assessment Needs assistance  Sitting-balance support Feet supported  Sitting balance-Leahy Scale Good  Standing balance support No upper extremity supported;During functional activity  Standing balance-Leahy Scale Fair  Vision- Assessment  Additional Comments pt demonstrates difficulty identifying details of objects   Transfers  Overall transfer level Needs assistance  Equipment used None  Transfers Sit to/from Stand  Sit to Stand Min guard  General transfer comment able to stand to pull pants over hips with min guard assist, bracing LEs against bed   General Comments  General comments (skin integrity, edema, etc.) pt and spouse instructed in strategies to reduce risk of falls, including removing clutter, increase lighting, use of RW, remove area rugs, and to keep frequently used items in the same location to improve ease of locating them.  Handout was provided.  Pt instructed to make an appointment with her opthalmologist for full acuity and visual field assessment   OT - End of Session  Activity Tolerance Patient limited by fatigue  Patient left in bed;with call bell/phone within reach;with bed alarm set;with family/visitor present  OT Assessment/Plan  OT Plan Discharge plan remains appropriate  OT Visit Diagnosis Unsteadiness on feet (R26.81);Low vision, both eyes (H54.2)  OT Frequency (ACUTE ONLY) Min 2X/week  Follow Up Recommendations Home health OT;Supervision/Assistance - 24 hour  OT Equipment None recommended by OT  AM-PAC OT "6 Clicks" Daily Activity Outcome Measure (Version 2)  Help from another person eating meals? 4  Help from another person  taking care of personal grooming? 3  Help from another person toileting, which includes using toliet, bedpan, or urinal? 3  Help from another person bathing (including washing, rinsing, drying)? 3  Help from another person to put on and taking off regular upper body clothing? 3  Help from another person to put on and taking off regular lower body clothing? 3  6 Click Score 19  OT Goal Progression  Progress towards OT goals Progressing toward goals  OT Time Calculation  OT Start Time (ACUTE ONLY) 1521  OT Stop Time (ACUTE ONLY) 1537  OT Time Calculation (min) 16 min  OT General Charges  $OT Visit 1 Visit  OT Treatments  $Self Care/Home Management  8-22 mins  Nilsa Nutting., OTR/L Acute Rehabilitation Services Pager [phone] Office [phone]

## 2019-12-08 ENCOUNTER — Encounter: Payer: Self-pay | Admitting: *Deleted

## 2020-01-19 ENCOUNTER — Telehealth: Payer: Self-pay | Admitting: *Deleted

## 2020-01-19 ENCOUNTER — Encounter: Payer: Self-pay | Admitting: Diagnostic Neuroimaging

## 2020-01-19 ENCOUNTER — Institutional Professional Consult (permissible substitution): Payer: Medicare Other | Admitting: Diagnostic Neuroimaging

## 2020-01-19 NOTE — Telephone Encounter (Signed)
Patient was no show for internal referral appointment today.

## 2020-01-20 ENCOUNTER — Encounter: Payer: Self-pay | Admitting: Diagnostic Neuroimaging

## 2020-03-02 ENCOUNTER — Other Ambulatory Visit: Payer: Self-pay

## 2020-03-02 NOTE — Patient Outreach (Signed)
[facility]) Care Management  03/02/2020  Jeanette [name] [birth year] [phone]  Telephone outreach to patient to obtain mRS was successfully completed. MRS=2  Patient shared with CMA that she did have some concerns regarding her visit while in the ED. Patient felt that stroke took place while waiting as she had been in radiology prior to hearing she had a clot. She added that she feels she had not received adequate treatment prior to being admitted.   CMA shared empathy for her visit and given the information to follow up with a grievance online. CMA also shared call would be documented for future reference.  Ina Homes [facility] Management Assistant [phone]

## 2020-08-23 ENCOUNTER — Other Ambulatory Visit: Payer: Self-pay | Admitting: Internal Medicine

## 2020-08-23 ENCOUNTER — Other Ambulatory Visit (HOSPITAL_COMMUNITY): Payer: Self-pay | Admitting: Internal Medicine

## 2020-08-23 ENCOUNTER — Other Ambulatory Visit: Payer: Self-pay

## 2020-08-23 ENCOUNTER — Ambulatory Visit (HOSPITAL_COMMUNITY)
Admission: RE | Admit: 2020-08-23 | Discharge: 2020-08-23 | Disposition: A | Discharge status: Home / Self Care | Payer: Medicare Other | Source: Ambulatory Visit | Attending: Internal Medicine | Admitting: Internal Medicine

## 2020-08-23 ENCOUNTER — Other Ambulatory Visit (HOSPITAL_COMMUNITY): Payer: Self-pay | Admitting: Family Medicine

## 2020-08-23 DIAGNOSIS — I639 Cerebral infarction, unspecified: Secondary | ICD-10-CM

## 2020-08-23 DIAGNOSIS — I6522 Occlusion and stenosis of left carotid artery: Secondary | ICD-10-CM

## 2020-08-23 DIAGNOSIS — I6990 Unspecified sequelae of unspecified cerebrovascular disease: Secondary | ICD-10-CM

## 2020-08-23 DIAGNOSIS — M542 Cervicalgia: Secondary | ICD-10-CM

## 2020-08-24 ENCOUNTER — Ambulatory Visit (HOSPITAL_COMMUNITY)
Admission: RE | Admit: 2020-08-24 | Discharge: 2020-08-24 | Disposition: A | Discharge status: Home / Self Care | Payer: Medicare Other | Source: Ambulatory Visit | Attending: Internal Medicine | Admitting: Internal Medicine

## 2020-08-24 DIAGNOSIS — I639 Cerebral infarction, unspecified: Secondary | ICD-10-CM | POA: Diagnosis not present

## 2020-08-24 DIAGNOSIS — I6990 Unspecified sequelae of unspecified cerebrovascular disease: Secondary | ICD-10-CM | POA: Diagnosis present

## 2020-08-24 DIAGNOSIS — M542 Cervicalgia: Secondary | ICD-10-CM | POA: Insufficient documentation

## 2020-08-24 MED ORDER — GADOBUTROL 1 MMOL/ML IV SOLN
8.0000 mL | Freq: Once | INTRAVENOUS | Status: AC | PRN
  Administered 2020-08-24: 8 mL via INTRAVENOUS

## 2020-09-21 ENCOUNTER — Other Ambulatory Visit: Payer: Self-pay

## 2020-09-21 ENCOUNTER — Ambulatory Visit: Payer: Medicare Other | Admitting: Diagnostic Neuroimaging

## 2020-09-21 ENCOUNTER — Encounter: Payer: Self-pay | Admitting: Diagnostic Neuroimaging

## 2020-09-21 VITALS — BP 133/82 | HR 72 | Ht 63.0 in | Wt 162.4 lb

## 2020-09-21 DIAGNOSIS — I63432 Cerebral infarction due to embolism of left posterior cerebral artery: Secondary | ICD-10-CM

## 2020-09-21 DIAGNOSIS — M542 Cervicalgia: Secondary | ICD-10-CM

## 2020-09-21 NOTE — Progress Notes (Signed)
[facility]  PATIENT: Jeanette OSLAND DOB: 08/18/1934  REFERRING CLINICIAN: Crist Infante, MD HISTORY FROM: patient  REASON FOR VISIT: new consult    HISTORICAL   CHIEF COMPLAINT:  Chief Complaint  Patient presents with  . Recent strokes seen on MRI    Rm 7    HISTORY OF PRESENT ILLNESS:   UPDATE (09/21/20, VRP): Since last visit, here for evaluation of abnormal MRI.  In May 2021 patient went to the hospital for stroke evaluation, was treated with mechanical thrombectomy.  Patient's anticoagulation was changed to Eliquis.  Patient was doing well until last few months when she is having more problems with neck pain, headaches, fatigue and malaise.  MRI of the brain was obtained which showed a chronic right occipital infarct that was new compared to prior study.  Patient referred here for further stroke prevention evaluation.  Also had MRI of the cervical spine which showed some foraminal stenoses.   PRIOR HPI: 86 year old female here for evaluation of numbness.  History of atrial fibrillation, hypertension, hyperlipidemia.  History of stroke.  08/04/2019 patient had first dose of Covid vaccine.  On 08/12/2019 patient had onset of numbness in her left arm and left face.  Symptoms would fluctuate.  Symptoms were worse when she would stand up and improved she sat down.  Symptoms continued over several days.  Patient went to PCP for evaluation who ordered CT angiogram of the head and neck.  Patient referred here for further evaluation.  Since that time symptoms continue to fluctuate.  Currently she feels a little bit of numbness in her left tongue.  No face or arm numbness.  No weakness.   REVIEW OF SYSTEMS: Full 14 system review of systems performed and negative with exception of: As per HPI.  ALLERGIES: Allergies  Allergen Reactions  . Epinephrine Anaphylaxis    Shortness of breath and increased heart rate  . Cozaar [Losartan]     Other reaction(s): increased air  hunger and dizziness.  . Crestor [Rosuvastatin]     Other reaction(s): memory problems.  Sarina Ill [Sulfamethoxazole-Trimethoprim]     Other reaction(s): 2017 had a strange reaction she says.  . Vytorin [Ezetimibe-Simvastatin]   . Benazepril Hcl Cough  . Crestor [Rosuvastatin Calcium] Other (See Comments)    cognitive  . Lisinopril Cough    HOME MEDICATIONS: Outpatient Medications Prior to Visit  Medication Sig Dispense Refill  . apixaban (ELIQUIS) 5 MG TABS tablet Take 1 tablet (5 mg total) by mouth 2 (two) times daily. 60 tablet 1  . cholecalciferol (VITAMIN D) 1000 units tablet Take 1,000 Units by mouth daily.    . diclofenac Sodium (VOLTAREN) 1 % GEL Apply topically 4 (four) times daily.    Marland Kitchen escitalopram (LEXAPRO) 10 MG tablet Take 10 mg by mouth daily.    Marland Kitchen gabapentin (NEURONTIN) 100 MG capsule Take 100 mg by mouth as needed.    Marland Kitchen HYDROcodone-acetaminophen (NORCO/VICODIN) 5-325 MG tablet Take 1 tablet by mouth every 6 (six) hours as needed for moderate pain. Taking 1/2 as needed    . metoprolol (TOPROL-XL) 200 MG 24 hr tablet Take 100 mg by mouth 2 (two) times daily.    Marland Kitchen olmesartan (BENICAR) 20 MG tablet Take 10 mg by mouth daily.    Marland Kitchen REPATHA SURECLICK 283 MG/ML SOAJ Inject 140 mg into the skin See admin instructions. Take 140mg  SQ every two weeks per patient    . vitamin B-12 (CYANOCOBALAMIN) 100 MCG tablet Take 50 mcg by mouth daily.    Marland Kitchen  nitroGLYCERIN (NITROSTAT) 0.4 MG SL tablet PLACE 1 TABLET (0.4 MG TOTAL) UNDER THE TONGUE EVERY 5 (FIVE) MINUTES AS NEEDED FOR CHEST PAIN. 75 tablet 1  . Coenzyme Q10 (COQ10 PO) Take 1 capsule by mouth daily.    Marland Kitchen loratadine (CLARITIN) 10 MG tablet Take 10 mg by mouth daily as needed for allergies.     No facility-administered medications prior to visit.    PAST MEDICAL HISTORY: Past Medical History:  Diagnosis Date  . A-fib ([place]) 03/15/2018  . Anal cancer ([place])   . Anxiety   . Arthritis   . Breast cancer ([place])   . Breast cancer ([place])    . Cancer of breast ([place])   . Depression   . Dysrhythmia 2010   a-fib  . Hyperlipidemia   . Hypertension   . PVD (peripheral vascular disease) ([place])   . RAS (renal artery stenosis) ([place])   . Stroke ([place]) 2019   no residual, x 2    PAST SURGICAL HISTORY: Past Surgical History:  Procedure Laterality Date  . BREAST BIOPSY Left 01/14/2003  . BREAST LUMPECTOMY Right 1989  . CARDIOVASCULAR STRESS TEST  07/27/2003   No evidence if LV myocardial ischemia or scar. LV EF 74%.  Marland Kitchen GYNECOLOGIC CRYOSURGERY    . IR CT HEAD LTD  12/04/2019  . IR PERCUTANEOUS ART THROMBECTOMY/INFUSION INTRACRANIAL INC DIAG ANGIO  12/04/2019  . IR US GUIDE VASC ACCESS LEFT  12/04/2019  . OPEN REDUCTION INTERNAL FIXATION (ORIF) DISTAL RADIAL FRACTURE Left 02/19/2017   Procedure: OPEN REDUCTION INTERNAL FIXATION (ORIF) LEFT DISTAL RADIAL FRACTURE;  Surgeon: Leanora Cover, MD;  Location: [facility];  Service: Orthopedics;  Laterality: Left;  . OVARIAN CYST REMOVAL    . RADIOLOGY WITH ANESTHESIA N/A 12/04/2019   Procedure: IR WITH ANESTHESIA;  Surgeon: Luanne Bras, MD;  Location: [place];  Service: Radiology;  Laterality: N/A;  . RENAL DOPPLER  09/09/2012   Right proximal renal artery: 60-99% diameter reduction. Normal patency of the left main renal artery.   . TRANSTHORACIC ECHOCARDIOGRAM  03/22/2006   EF 55-60%, mild mitral valvular regurg, pulmonary artery systolic pressure was moderately increased.  . TUBAL LIGATION      FAMILY HISTORY: Family History  Problem Relation Age of Onset  . Alzheimer's disease Mother   . Pneumonia Mother   . Heart failure Father   . Heart disease Sister   . Heart disease Brother   . Heart disease Brother   . Heart disease Sister   . Pneumonia Sister     SOCIAL HISTORY: Social History   Socioeconomic History  . Marital status: Married    Spouse name: Richard  . Number of children: 4  . Years of education: Not on file  . Highest education level: Not on file   Occupational History  . Occupation: retired Proofreader    Comment: Clinical research associate  Tobacco Use  . Smoking status: Never Smoker  . Smokeless tobacco: Never Used  Vaping Use  . Vaping Use: Never used  Substance and Sexual Activity  . Alcohol use: Yes    Alcohol/week: 2.0 standard drinks    Types: 2 Glasses of wine per week    Comment: rare  . Drug use: No  . Sexual activity: Yes    Partners: Male    Birth control/protection: Post-menopausal  Other Topics Concern  . Not on file  Social History Narrative   Lives with spouse   Social Determinants of Health   Financial Resource Strain: Not on  file  Food Insecurity: Not on file  Transportation Needs: Not on file  Physical Activity: Not on file  Stress: Not on file  Social Connections: Not on file  Intimate Partner Violence: Not on file     PHYSICAL EXAM  GENERAL EXAM/CONSTITUTIONAL: Vitals:  Vitals:   09/21/20 1447  BP: 133/82  Pulse: 72  Weight: 162 lb 6.4 oz (73.7 kg)  Height: 5\' 3"  (1.6 m)   Body mass index is 28.77 kg/m. Wt Readings from Last 3 Encounters:  09/21/20 162 lb 6.4 oz (73.7 kg)  12/06/19 170 lb 10.2 oz (77.4 kg)  10/19/19 163 lb (73.9 kg)    Patient is in no distress; well developed, nourished and groomed; neck is supple  CARDIOVASCULAR:  Examination of carotid arteries is normal; no carotid bruits  Regular rate and rhythm, no murmurs  Examination of peripheral vascular system by observation and palpation is normal  EYES:  Ophthalmoscopic exam of optic discs and posterior segments is normal; no papilledema or hemorrhages No exam data present  MUSCULOSKELETAL:  Gait, strength, tone, movements noted in Neurologic exam below  NEUROLOGIC: MENTAL STATUS:  No flowsheet data found.  awake, alert, oriented to person, place and time  recent and remote memory intact  normal attention and concentration  language fluent, comprehension intact, naming intact  fund of knowledge  appropriate  CRANIAL NERVE:   2nd - no papilledema on fundoscopic exam  2nd, 3rd, 4th, 6th - pupils equal and reactive to light, visual fields full to confrontation, extraocular muscles intact, no nystagmus  5th - facial sensation symmetric  7th - facial strength symmetric  8th - hearing intact  9th - palate elevates symmetrically, uvula midline  11th - shoulder shrug symmetric  12th - tongue protrusion midline  MOTOR:   normal bulk and tone, full strength in the BUE, BLE  SENSORY:   normal and symmetric to light touch, temperature, vibration; DECR IN FEET  COORDINATION:   finger-nose-finger, fine finger movements normal  REFLEXES:   deep tendon reflexes present and symmetric  GAIT/STATION:   narrow based gait     DIAGNOSTIC DATA (LABS, IMAGING, TESTING) - I reviewed patient records, labs, notes, testing and imaging myself where available.  Lab Results  Component Value Date   WBC 4.7 12/04/2019   HGB 12.6 12/04/2019   HCT 37.0 12/04/2019   MCV 94.9 12/04/2019   PLT 220 12/04/2019      Component Value Date/Time   NA 139 12/04/2019 1236   K 4.0 12/04/2019 1236   CL 100 12/04/2019 1236   CO2 26 12/04/2019 1228   GLUCOSE 129 (H) 12/04/2019 1236   BUN 12 12/04/2019 1236   CREATININE 0.70 12/04/2019 1236   CALCIUM 10.4 (H) 12/04/2019 1228   PROT 6.7 12/04/2019 1228   ALBUMIN 3.7 12/04/2019 1228   AST 21 12/04/2019 1228   ALT 14 12/04/2019 1228   ALKPHOS 56 12/04/2019 1228   BILITOT 0.6 12/04/2019 1228   GFRNONAA >60 12/04/2019 1228   GFRAA >60 12/04/2019 1228   Lab Results  Component Value Date   CHOL 119 12/05/2019   HDL 44 12/05/2019   LDLCALC 59 12/05/2019   TRIG 79 12/05/2019   CHOLHDL 2.7 12/05/2019   Lab Results  Component Value Date   HGBA1C 6.0 (H) 12/05/2019   No results found for: VITAMINB12 No results found for: TSH   03/25/19 TTE - Left ventricle cavity is normal in size. Normal left ventricular wall  thickness. Normal LV  systolic function with  EF 56%. Normal global wall  motion. Doppler evidence of grade I (impaired) diastolic dysfunction,  normal LAP. Calculated EF 56%.  Trileaflet aortic valve. Trace aortic regurgitation.  Mild tricuspid regurgitation. Estimated pulmonary artery systolic pressure  is 30 mmHg.   08/14/19 CTA head / neck [I reviewed images myself and agree with interpretation. -VRP]  - No acute intracranial hemorrhage or evidence of acute infarction. Stable chronic findings detailed above. - Similar plaque at the ICA origins without hemodynamically significant stenosis. - New high-grade stenosis with near occlusion of the mid right P2 PCA with reconstitution. This could be responsible for an occult perforator infarct. MRI could be obtained as clinically warranted.   09/08/19 MRI brain -Chronic left parietal ischemic infarction; smaller chronic left occipital ischemic infarction. -No new or acute findings.  12/04/19 MRI brain [I reviewed images myself and agree with interpretation. -VRP]  - Small acute infarct right occipital and right parietal lobe. Larger infarct right hippocampus - 6 x 10 mm right frontal meningioma unchanged from prior study.  08/24/20 MRI brain / MRA head [I reviewed images myself and agree with interpretation.  The right occipital focus of gliosis seen on current study is subtle and could have been missed on prior studies due to slice selection and improved resolution of current study. -VRP]  1. No acute intracranial abnormality. 2. Chronic ischemia with multiple old infarcts as above. Tiny bilateral occipital infarcts are new from 12/04/2019. 3. Unchanged small right frontal meningioma. 4. Similar appearance of intracranial atherosclerosis including severe right A1 and moderate bilateral M1 stenoses.    ASSESSMENT AND PLAN  86 y.o. year old female here with hypertension, hyperlipidemia, atrial fibrillation, stroke, here for evaluation of new onset left face and left arm  numbness, fluctuating and intermittent since 08/12/2019.  MRI studies were reviewed with patient in the office today.  08/24/2020 MRI shows an interval right occipital infarct/gliosis that is subtle.  I think this is clinically insignificant, and could have been related to infarct in May 2021 with right PCA stenosis/occlusion, but not seen on prior study due to slice selection differences.  In any case patient is on very good medical therapy for stroke prevention I do not recommend any other changes at this time.  I do not see any clinical signs of this infarct chronic exam today.  In terms of patient's neck pain I think this is related to musculoskeletal tension and muscle spasm.  Patient is getting some benefit with gabapentin, hydrocodone and Flexeril.  I encouraged patient to proceed with physical activity or physical therapy evaluation, massage, acupuncture dry needling.  She has signed up for fitness membership at the new [place] location, but is needing medical clearance before she starts, related to her dyspnea on exertion or shortness of breath.  I advised her to follow-up with PCP regarding this.  There is no neurologic contraindication for her to start an exercise program from my standpoint   Dx:  1. Cerebrovascular accident (CVA) due to embolism of left posterior cerebral artery ([place])   2. Neck pain      PLAN:  STROKE PREVENTION - eliquis, repatha, metoprolol, benicar  CHRONIC NECK / HEAD PAIN - continue gabapentin, hydrocodone, flexeril  Return for return to PCP.     Penni Bombard, MD 5/36/4680, 3:21 PM Certified in Neurology, Neurophysiology and Neuroimaging  [facility] [address] [place], [place] [address] [phone]

## 2020-09-25 NOTE — Progress Notes (Signed)
No show

## 2020-09-26 ENCOUNTER — Institutional Professional Consult (permissible substitution): Payer: Medicare Other | Admitting: Diagnostic Neuroimaging

## 2020-09-26 ENCOUNTER — Ambulatory Visit: Payer: Medicare Other | Admitting: Cardiology

## 2020-09-26 DIAGNOSIS — E782 Mixed hyperlipidemia: Secondary | ICD-10-CM

## 2020-09-26 DIAGNOSIS — Z8673 Personal history of transient ischemic attack (TIA), and cerebral infarction without residual deficits: Secondary | ICD-10-CM

## 2020-09-26 DIAGNOSIS — I480 Paroxysmal atrial fibrillation: Secondary | ICD-10-CM

## 2020-10-11 ENCOUNTER — Encounter: Payer: Self-pay | Admitting: Physician Assistant

## 2020-10-11 ENCOUNTER — Other Ambulatory Visit: Payer: Self-pay

## 2020-10-11 ENCOUNTER — Ambulatory Visit: Payer: Medicare Other | Admitting: Physician Assistant

## 2020-10-11 DIAGNOSIS — L821 Other seborrheic keratosis: Secondary | ICD-10-CM | POA: Diagnosis not present

## 2020-10-11 DIAGNOSIS — L820 Inflamed seborrheic keratosis: Secondary | ICD-10-CM

## 2020-10-11 DIAGNOSIS — L570 Actinic keratosis: Secondary | ICD-10-CM | POA: Diagnosis not present

## 2020-10-11 DIAGNOSIS — Z1283 Encounter for screening for malignant neoplasm of skin: Secondary | ICD-10-CM

## 2020-10-11 NOTE — Progress Notes (Signed)
   New Patient   Subjective  Jeanette [name] is a 86 y.o. female who presents for the following: Annual Exam (Left breast crusty lesion for at least 10 years, left arm small black area x 4 months.- neither of the lesions have any symptoms.   The following portions of the chart were reviewed this encounter and updated as appropriate:  Tobacco  Allergies  Meds  Problems  Med Hx  Surg Hx  Fam Hx      Objective  Well appearing patient in no apparent distress; mood and affect are within normal limits.  A full examination was performed including scalp, head, eyes, ears, nose, lips, neck, chest, axillae, abdomen, back, buttocks, bilateral upper extremities, bilateral lower extremities, hands, feet, fingers, toes, fingernails, and toenails. All findings within normal limits unless otherwise noted below.  Objective  Left Breast: Multiple Stuck-on, waxy, tan-brown papules and plaques. --Discussed benign etiology and prognosis.   Objective  Mid Tip of Nose: Erythematous patches with gritty scale.  Objective  Left Forearm - Anterior: Erythematous stuck-on, crusty plaque on a pink base.    Assessment & Plan  Seborrheic keratosis Left Breast  Okay to leave if stable  AK (actinic keratosis) Mid Tip of Nose  Destruction of lesion - Mid Tip of Nose Complexity: simple   Destruction method: cryotherapy   Informed consent: discussed and consent obtained   Timeout:  patient name, date of birth, surgical site, and procedure verified Lesion destroyed using liquid nitrogen: Yes   Cryotherapy cycles:  3 Outcome: patient tolerated procedure well with no complications    Seborrheic keratosis, inflamed Left Forearm - Anterior  Destruction of lesion - Left Forearm - Anterior Complexity: simple   Destruction method: cryotherapy   Informed consent: discussed and consent obtained   Timeout:  patient name, date of birth, surgical site, and procedure verified Lesion destroyed using liquid  nitrogen: Yes   Cryotherapy cycles:  3 Outcome: patient tolerated procedure well with no complications       I, Ajani Schnieders, PA-C, have reviewed all documentation for this visit. The documentation on 10/11/20 for the exam, diagnosis, procedures, and orders are all accurate and complete.

## 2020-10-11 NOTE — Patient Instructions (Signed)
Seborrheic Keratosis A seborrheic keratosis is a common, noncancerous (benign) skin growth. These growths are velvety, waxy, rough, tan, brown, or black spots that appear on the skin. These skin growths can be flat or raised, and scaly. What are the causes? The cause of this condition is not known. What increases the risk? You are more likely to develop this condition if you:  Have a family history of seborrheic keratosis.  Are 50 or older.  Are pregnant.  Have had estrogen replacement therapy. What are the signs or symptoms? Symptoms of this condition include growths on the face, chest, shoulders, back, or other areas. These growths:  Are usually painless, but may become irritated and itchy.  Can be yellow, brown, black, or other colors.  Are slightly raised or have a flat surface.  Are sometimes rough or wart-like in texture.  Are often velvety or waxy on the surface.  Are round or oval-shaped.  Often occur in groups, but may occur as a single growth.   How is this diagnosed? This condition is diagnosed with a medical history and physical exam.  A sample of the growth may be tested (skin biopsy).  You may need to see a skin specialist (dermatologist). How is this treated? Treatment is not usually needed for this condition, unless the growths are irritated or bleed often.  You may also choose to have the growths removed if you do not like their appearance. ? Most commonly, these growths are treated with a procedure in which liquid nitrogen is applied to "freeze" off the growth (cryosurgery). ? They may also be burned off with electricity (electrocautery) or removed by scraping (curettage). Follow these instructions at home:  Watch your growth for any changes.  Keep all follow-up visits as told by your health care provider. This is important.  Do not scratch or pick at the growth or growths. This can cause them to become irritated or infected. Contact a health care  provider if:  You suddenly have many new growths.  Your growth bleeds, itches, or hurts.  Your growth suddenly becomes larger or changes color. Summary  A seborrheic keratosis is a common, noncancerous (benign) skin growth.  Treatment is not usually needed for this condition, unless the growths are irritated or bleed often.  Watch your growth for any changes.  Contact a health care provider if you suddenly have many new growths or your growth suddenly becomes larger or changes color.  Keep all follow-up visits as told by your health care provider. This is important. This information is not intended to replace advice given to you by your health care provider. Make sure you discuss any questions you have with your health care provider. Document Revised: 11/07/2017 Document Reviewed: 11/07/2017 Elsevier Patient Education  2021 Elsevier Inc.  

## 2020-11-30 ENCOUNTER — Ambulatory Visit: Payer: Medicare Other | Admitting: Physician Assistant

## 2020-12-12 ENCOUNTER — Other Ambulatory Visit: Payer: Self-pay | Admitting: Internal Medicine

## 2020-12-12 DIAGNOSIS — Z1231 Encounter for screening mammogram for malignant neoplasm of breast: Secondary | ICD-10-CM

## 2020-12-16 ENCOUNTER — Other Ambulatory Visit: Payer: Self-pay | Admitting: Internal Medicine

## 2020-12-16 DIAGNOSIS — N644 Mastodynia: Secondary | ICD-10-CM

## 2020-12-20 ENCOUNTER — Ambulatory Visit
Admission: RE | Admit: 2020-12-20 | Discharge: 2020-12-20 | Disposition: A | Discharge status: Home / Self Care | Payer: Medicare Other | Source: Ambulatory Visit | Attending: Internal Medicine | Admitting: Internal Medicine

## 2020-12-20 ENCOUNTER — Ambulatory Visit: Payer: Medicare Other

## 2020-12-20 ENCOUNTER — Other Ambulatory Visit: Payer: Self-pay

## 2020-12-20 DIAGNOSIS — N644 Mastodynia: Secondary | ICD-10-CM

## 2020-12-21 ENCOUNTER — Other Ambulatory Visit: Payer: Self-pay | Admitting: Internal Medicine

## 2021-04-18 ENCOUNTER — Other Ambulatory Visit: Payer: Self-pay

## 2021-04-18 ENCOUNTER — Emergency Department (HOSPITAL_COMMUNITY): Payer: Medicare Other

## 2021-04-18 ENCOUNTER — Emergency Department (HOSPITAL_COMMUNITY)
Admission: EM | Admit: 2021-04-18 | Discharge: 2021-04-18 | Disposition: A | Discharge status: Home / Self Care | Payer: Medicare Other | Attending: Emergency Medicine | Admitting: Emergency Medicine

## 2021-04-18 ENCOUNTER — Encounter (HOSPITAL_COMMUNITY): Payer: Self-pay

## 2021-04-18 DIAGNOSIS — W19XXXA Unspecified fall, initial encounter: Secondary | ICD-10-CM | POA: Insufficient documentation

## 2021-04-18 DIAGNOSIS — Z5321 Procedure and treatment not carried out due to patient leaving prior to being seen by health care provider: Secondary | ICD-10-CM | POA: Diagnosis not present

## 2021-04-18 DIAGNOSIS — M25562 Pain in left knee: Secondary | ICD-10-CM | POA: Diagnosis not present

## 2021-04-18 DIAGNOSIS — S99921A Unspecified injury of right foot, initial encounter: Secondary | ICD-10-CM | POA: Diagnosis present

## 2021-04-18 DIAGNOSIS — S90111A Contusion of right great toe without damage to nail, initial encounter: Secondary | ICD-10-CM | POA: Insufficient documentation

## 2021-04-18 NOTE — ED Notes (Signed)
Pt refused vital signs re-assessment in ED lobby.

## 2021-04-18 NOTE — ED Triage Notes (Signed)
Pt fell last night about 9 pm, no pain initially pain gradually progressed. She is hurting in the posterior part of left knee and right big toe is bruised.

## 2021-04-18 NOTE — ED Notes (Signed)
Pt refused vitals x2

## 2021-04-18 NOTE — ED Notes (Signed)
Pt refused vitals informed charge nurse

## 2021-05-17 DIAGNOSIS — E210 Primary hyperparathyroidism: Secondary | ICD-10-CM | POA: Insufficient documentation

## 2021-05-23 ENCOUNTER — Other Ambulatory Visit (HOSPITAL_COMMUNITY): Payer: Self-pay | Admitting: Surgery

## 2021-05-23 DIAGNOSIS — E210 Primary hyperparathyroidism: Secondary | ICD-10-CM

## 2021-05-23 DIAGNOSIS — E042 Nontoxic multinodular goiter: Secondary | ICD-10-CM

## 2021-06-06 ENCOUNTER — Telehealth: Payer: Self-pay | Admitting: Diagnostic Neuroimaging

## 2021-06-06 NOTE — Telephone Encounter (Addendum)
Spoke with patient, husband was on the call as well. She stated her strength in arms and legs is decreasing, having to use a cane, difficulty getting in out chairs, can't stand up long enough to cook. She did start at fitness center but wasn't pleased with her experience. Husband stated she is generally weak. He works but is having to do more to help her.  I advised her that her last stroke was May 2021, most likely this is not stroke related. She may need to see PCP for work up for possible causes. She stated she is getting ready to get work up of parathyroid. Work up of thyroid came back okay.  She stated this may be related to Covid; I advised that her PCP can refer for therapy but she probably needs to be evaluated for other possible causes. Husband stated something needs to be done so she can take care of herself. He asked if a  Psychologist would help due to her general perception of what's going on. I advised will send to Dr Leta Baptist and call with his recommendations. Patient and husband verbalized understanding, appreciation.

## 2021-06-06 NOTE — Telephone Encounter (Signed)
Pt called stated she is not bouncing back from her strokes like she used to. Wanting to know if she can have home health referral for in home therapy.

## 2021-06-07 NOTE — Telephone Encounter (Signed)
Spoke with patient and advised her Dr Leta Baptist recommends they check in with her PCP for evaluaiton and home health / PT if needed. We can setup a follow up appt with [place] if needed. She verbalized understanding, appreciation.

## 2021-06-13 ENCOUNTER — Other Ambulatory Visit: Payer: Self-pay

## 2021-06-13 ENCOUNTER — Encounter (HOSPITAL_COMMUNITY)
Admission: RE | Admit: 2021-06-13 | Discharge: 2021-06-13 | Disposition: A | Discharge status: Home / Self Care | Payer: Medicare Other | Source: Ambulatory Visit | Attending: Surgery | Admitting: Surgery

## 2021-06-13 ENCOUNTER — Ambulatory Visit (HOSPITAL_COMMUNITY)
Admission: RE | Admit: 2021-06-13 | Discharge: 2021-06-13 | Disposition: A | Discharge status: Home / Self Care | Payer: Medicare Other | Source: Ambulatory Visit | Attending: Surgery | Admitting: Surgery

## 2021-06-13 DIAGNOSIS — E042 Nontoxic multinodular goiter: Secondary | ICD-10-CM | POA: Diagnosis present

## 2021-06-13 DIAGNOSIS — E210 Primary hyperparathyroidism: Secondary | ICD-10-CM | POA: Diagnosis present

## 2021-06-15 NOTE — Progress Notes (Signed)
USN and nuclear med sestamibi scan are both negative for any sign of parathyroid adenoma.  Will ask my nurse, Claiborne Billings, to schedule a 4D-CT scan of the neck with parathyroid protocol as we discussed at the office.  Will contact patient with results when available.  [place], MD [facility] [place] practice Office: [phone]

## 2021-06-29 ENCOUNTER — Other Ambulatory Visit: Payer: Self-pay | Admitting: Surgery

## 2021-06-29 DIAGNOSIS — E213 Hyperparathyroidism, unspecified: Secondary | ICD-10-CM

## 2021-07-26 ENCOUNTER — Telehealth: Payer: Self-pay | Admitting: Hematology and Oncology

## 2021-07-26 NOTE — Telephone Encounter (Signed)
Scheduled appt per 1/18 referral. Pt is aware of appt date and time. Pt is aware to arrive 15 mins prior to appt time.

## 2021-08-01 ENCOUNTER — Ambulatory Visit
Admission: RE | Admit: 2021-08-01 | Discharge: 2021-08-01 | Disposition: A | Discharge status: Home / Self Care | Payer: Medicare Other | Source: Ambulatory Visit | Attending: Surgery | Admitting: Surgery

## 2021-08-01 ENCOUNTER — Other Ambulatory Visit: Payer: Self-pay

## 2021-08-01 DIAGNOSIS — E213 Hyperparathyroidism, unspecified: Secondary | ICD-10-CM

## 2021-08-01 MED ORDER — IOPAMIDOL (ISOVUE-300) INJECTION 61%
75.0000 mL | Freq: Once | INTRAVENOUS | Status: AC | PRN
  Administered 2021-08-01: 75 mL via INTRAVENOUS

## 2021-08-10 ENCOUNTER — Encounter (HOSPITAL_COMMUNITY): Payer: Medicare Other

## 2021-08-10 NOTE — Progress Notes (Signed)
[name] [phone]   Fax:([phone]) [place] NOTE  Patient Care Team: Crist Infante, MD as PCP - General (Internal Medicine)  Hematological/Oncological History # Iron Deficiency Anemia 2/2 Unclear Etiology  08/04/2021: WBC 7.05, Hgb 8.7, MCV 82.7, Plt 407. Ferritin 149, Iron sat 9%, TIBC 203, Iron 19.  Cr 0.7 08/11/2021: establish care with Dr. Lorenso Courier   CHIEF COMPLAINTS/PURPOSE OF CONSULTATION:  " Iron Deficiency Anemia  "  HISTORY OF PRESENTING ILLNESS:  Jeanette [name] 87 y.o. female with medical history significant for atrial fibrillation, HLD, PVD, CVA, anal cancer status post surgery and chemoradiation 30 years ago, breast cancer status post resection, and arthritis who presents for evaluation of anemia.   On review of the previous records Jeanette [name] had labs drawn on 08/04/2021 which showed a hemoglobin 8.7, MCV 82.7, platelets of 4 7, ferritin 149, iron sat 9%, total iron binding capacity of 203, with an iron of 19.  She was set up for IV iron infusions on 08/18/2021 as well as 08/25/2021 and referred to hematology for further evaluation and management.  On exam today Jeanette [name] is accompanied by her husband.  She reports that she has been having difficulty with "long COVID symptoms" and subsequent CVAs.  She notes that she does have weakness as a result of long COVID.  She notes that she has been "anemic all my life".  Even dating back to elementary school she had iron deficiency.  She notes that she has been on iron tablets before in the past but is not currently taking any iron medication.  She notes that she has not noticed any overt signs of bleeding, bruising, or dark stools.  She also discussed that she underwent anal cancer treatment approximately 30 years ago as well as breast cancer resection 1 year prior to that.  She notes that her mother had a history of Guillain-Barr's and her father passed away of prostate cancer.  She has 7  siblings all but 1 of which are still living.  She notes that she is not ago colonoscopy approximately 4 years ago.  On further discussion she notes that she is a never smoker and only occasionally drinks wine.  She reports that she is not having any issues with shortness of breath or chest pain.  She notes that she is "not a big meat eater".  She otherwise denies any fevers, chills, sweats, nausea, or diarrhea.  A full 10 point ROS is listed below.  MEDICAL HISTORY:  Past Medical History:  Diagnosis Date   A-fib ([place]) 03/15/2018   Anal cancer ([place])    Anxiety    Arthritis    Breast cancer ([place])    Breast cancer ([place])    Cancer of breast ([place])    Depression    Dysrhythmia 2010   a-fib   Hyperlipidemia    Hypertension    PVD (peripheral vascular disease) ([place])    RAS (renal artery stenosis) ([place])    Stroke ([place]) 2019   no residual, x 2    SURGICAL HISTORY: Past Surgical History:  Procedure Laterality Date   BREAST BIOPSY Left 01/14/2003   BREAST LUMPECTOMY Right 1989   CARDIOVASCULAR STRESS TEST  07/27/2003   No evidence if LV myocardial ischemia or scar. LV EF 74%.   GYNECOLOGIC CRYOSURGERY     IR CT HEAD LTD  12/04/2019   IR PERCUTANEOUS ART THROMBECTOMY/INFUSION INTRACRANIAL INC DIAG ANGIO  12/04/2019   IR US GUIDE VASC ACCESS LEFT  12/04/2019   OPEN REDUCTION INTERNAL FIXATION (ORIF) DISTAL RADIAL FRACTURE Left 02/19/2017   Procedure: OPEN REDUCTION INTERNAL FIXATION (ORIF) LEFT DISTAL RADIAL FRACTURE;  Surgeon: Leanora Cover, MD;  Location: [facility];  Service: Orthopedics;  Laterality: Left;   OVARIAN CYST REMOVAL     RADIOLOGY WITH ANESTHESIA N/A 12/04/2019   Procedure: IR WITH ANESTHESIA;  Surgeon: Luanne Bras, MD;  Location: [facility];  Service: Radiology;  Laterality: N/A;   RENAL DOPPLER  09/09/2012   Right proximal renal artery: 60-99% diameter reduction. Normal patency of the left main renal artery.    TRANSTHORACIC ECHOCARDIOGRAM  03/22/2006   EF  55-60%, mild mitral valvular regurg, pulmonary artery systolic pressure was moderately increased.   TUBAL LIGATION      SOCIAL HISTORY: Social History   Socioeconomic History   Marital status: Married    Spouse name: Richard   Number of children: 4   Years of education: Not on file   Highest education level: Not on file  Occupational History   Occupation: retired Proofreader    Comment: rehab RN  Tobacco Use   Smoking status: Never   Smokeless tobacco: Never  Vaping Use   Vaping Use: Never used  Substance and Sexual Activity   Alcohol use: Yes    Alcohol/week: 2.0 standard drinks    Types: 2 Glasses of wine per week    Comment: rare   Drug use: No   Sexual activity: Yes    Partners: Male    Birth control/protection: Post-menopausal  Other Topics Concern   Not on file  Social History Narrative   Lives with spouse   Social Determinants of Health   Financial Resource Strain: Not on file  Food Insecurity: Not on file  Transportation Needs: Not on file  Physical Activity: Not on file  Stress: Not on file  Social Connections: Not on file  Intimate Partner Violence: Not on file    FAMILY HISTORY: Family History  Problem Relation Age of Onset   Alzheimer's disease Mother    Pneumonia Mother    Heart failure Father    Heart disease Sister    Heart disease Brother    Heart disease Brother    Heart disease Sister    Pneumonia Sister     ALLERGIES:  is allergic to epinephrine, cozaar [losartan], crestor [rosuvastatin], septra [sulfamethoxazole-trimethoprim], vytorin [ezetimibe-simvastatin], benazepril hcl, crestor [rosuvastatin calcium], and lisinopril.  MEDICATIONS:  Current Outpatient Medications  Medication Sig Dispense Refill   apixaban (ELIQUIS) 5 MG TABS tablet Take 1 tablet (5 mg total) by mouth 2 (two) times daily. 60 tablet 1   cholecalciferol (VITAMIN D) 1000 units tablet Take 1,000 Units by mouth daily.     diclofenac Sodium (VOLTAREN) 1 % GEL Apply  topically 4 (four) times daily.     escitalopram (LEXAPRO) 10 MG tablet Take 10 mg by mouth daily.     gabapentin (NEURONTIN) 100 MG capsule Take 100 mg by mouth as needed.     HYDROcodone-acetaminophen (NORCO/VICODIN) 5-325 MG tablet Take 1 tablet by mouth every 6 (six) hours as needed for moderate pain. Taking 1/2 as needed     metoprolol (TOPROL-XL) 200 MG 24 hr tablet Take 100 mg by mouth 2 (two) times daily.     nitroGLYCERIN (NITROSTAT) 0.4 MG SL tablet PLACE 1 TABLET (0.4 MG TOTAL) UNDER THE TONGUE EVERY 5 (FIVE) MINUTES AS NEEDED FOR CHEST PAIN. 75 tablet 1   olmesartan (BENICAR) 20 MG tablet Take 10 mg by mouth  daily.     REPATHA SURECLICK 423 MG/ML SOAJ Inject 140 mg into the skin See admin instructions. Take 140mg  SQ every two weeks per patient     vitamin B-12 (CYANOCOBALAMIN) 100 MCG tablet Take 50 mcg by mouth daily.     No current facility-administered medications for this visit.    REVIEW OF SYSTEMS:   Constitutional: ( - ) fevers, ( - )  chills , ( - ) night sweats Eyes: ( - ) blurriness of vision, ( - ) double vision, ( - ) watery eyes Ears, nose, mouth, throat, and face: ( - ) mucositis, ( - ) sore throat Respiratory: ( - ) cough, ( - ) dyspnea, ( - ) wheezes Cardiovascular: ( - ) palpitation, ( - ) chest discomfort, ( - ) lower extremity swelling Gastrointestinal:  ( - ) nausea, ( - ) heartburn, ( - ) change in bowel habits Skin: ( - ) abnormal skin rashes Lymphatics: ( - ) new lymphadenopathy, ( - ) easy bruising Neurological: ( - ) numbness, ( - ) tingling, ( - ) new weaknesses Behavioral/Psych: ( - ) mood change, ( - ) new changes  All other systems were reviewed with the patient and are negative.  PHYSICAL EXAMINATION:  Vitals:   08/11/21 0901  BP: 112/73  Pulse: 83  Resp: 16  Temp: 98.2 F (36.8 C)  SpO2: 96%   Filed Weights    GENERAL: well appearing elderly Caucasian female in NAD  SKIN: skin color, texture, turgor are normal, no rashes or  significant lesions EYES: conjunctiva are pink and non-injected, sclera clear LUNGS: clear to auscultation and percussion with normal breathing effort HEART: regular rate & rhythm and no murmurs and no lower extremity edema Musculoskeletal: no cyanosis of digits and no clubbing  PSYCH: alert & oriented x 3, fluent speech NEURO: no focal motor/sensory deficits  LABORATORY DATA:  I have reviewed the data as listed CBC Latest Ref Rng & Units 08/11/2021 12/04/2019 12/04/2019  WBC 4.0 - 10.5 K/uL 5.4 - 4.7  Hemoglobin 12.0 - 15.0 g/dL 8.5(L) 12.6 11.6(L)  Hematocrit 36.0 - 46.0 % 27.4(L) 37.0 37.0  Platelets 150 - 400 K/uL 378 - 220    CMP Latest Ref Rng & Units 08/11/2021 12/04/2019 12/04/2019  Glucose 70 - 99 mg/dL 122(H) 129(H) 130(H)  BUN 8 - 23 mg/dL 12 12 11   Creatinine 0.44 - 1.00 mg/dL 0.82 0.70 0.83  Sodium 135 - 145 mmol/L 135 139 136  Potassium 3.5 - 5.1 mmol/L 4.0 4.0 3.8  Chloride 98 - 111 mmol/L 101 100 102  CO2 22 - 32 mmol/L 28 - 26  Calcium 8.9 - 10.3 mg/dL 10.7(H) - 10.4(H)  Total Protein 6.5 - 8.1 g/dL 7.5 - 6.7  Total Bilirubin 0.3 - 1.2 mg/dL 0.4 - 0.6  Alkaline Phos 38 - 126 U/L 73 - 56  AST 15 - 41 U/L 8(L) - 21  ALT 0 - 44 U/L <5 - 14     ASSESSMENT & PLAN Jeanette [name] 87 y.o. female with medical history significant for atrial fibrillation, HLD, PVD, CVA, anal cancer status post surgery and chemoradiation 30 years ago, breast cancer status post resection, and arthritis who presents for evaluation of anemia.   After review of the labs, review of the records, and discussion with the patient the patients findings are most consistent with iron deficiency anemia secondary to unclear etiology.  The patient also has markedly low total iron binding capacity which may be a sign  of anemia of chronic disease.  At this time I do agree with administration of IV iron therapy as currently scheduled.  She has iron scheduled for 08/18/2021 and 08/25/2021 with IV Feraheme 510 mg.  We  will plan to have the patient back approximately 4 to 6 weeks after last dose of IV iron.  GI evaluation could be indicated if the patient was willing to undergo that work-up.  We will discuss that at her next visit.  # Iron Deficiency Anemia 2/2 Unclear Etiology  -- Findings are consistent with iron deficiency anemia secondary to an unclear etiology  --We will confirm iron deficiency anemia by ordering iron panel and ferritin as well as reticulocytes, CBC, and CMP --Continue ferrous sulfate 325 mg daily with a source of vitamin C -- proceed with IV iron therapy as scheduled in order to help bolster the patient's blood counts --Plan for return to clinic in 4 to 6 weeks time after last dose of IV iron  Orders Placed This Encounter  Procedures   CBC with Differential ([facility] Center Only)    Standing Status:   Future    Number of Occurrences:   1    Standing Expiration Date:   08/11/2022   CMP ([facility] only)    Standing Status:   Future    Number of Occurrences:   1    Standing Expiration Date:   08/11/2022   Ferritin    Standing Status:   Future    Number of Occurrences:   1    Standing Expiration Date:   08/11/2022   Iron and Iron Binding Capacity ([facility] only)    Standing Status:   Future    Number of Occurrences:   1    Standing Expiration Date:   08/11/2022   Retic Panel    Standing Status:   Future    Number of Occurrences:   1    Standing Expiration Date:   08/11/2022   Vitamin B12    Standing Status:   Future    Number of Occurrences:   1    Standing Expiration Date:   08/11/2022   Folate, Serum    Standing Status:   Future    Number of Occurrences:   1    Standing Expiration Date:   08/11/2022   Sedimentation rate    Standing Status:   Future    Number of Occurrences:   1    Standing Expiration Date:   08/11/2022   C-reactive protein    Standing Status:   Future    Number of Occurrences:   1    Standing Expiration Date:   08/11/2022    All questions were answered. The  patient knows to call the clinic with any problems, questions or concerns.  A total of more than 60 minutes were spent on this encounter with face-to-face time and non-face-to-face time, including preparing to see the patient, ordering tests and/or medications, counseling the patient and coordination of care as outlined above.   Ledell Peoples, MD Department of Hematology/Oncology [place] [facility] Phone: [phone] Pager: [phone] Email: [email or web address]  08/15/2021 4:23 PM

## 2021-08-11 ENCOUNTER — Inpatient Hospital Stay: Payer: Medicare Other | Attending: Hematology and Oncology | Admitting: Hematology and Oncology

## 2021-08-11 ENCOUNTER — Other Ambulatory Visit: Payer: Self-pay

## 2021-08-11 ENCOUNTER — Inpatient Hospital Stay: Payer: Medicare Other

## 2021-08-11 VITALS — BP 112/73 | HR 83 | Temp 98.2°F | Resp 16 | Ht 63.0 in

## 2021-08-11 DIAGNOSIS — Z923 Personal history of irradiation: Secondary | ICD-10-CM | POA: Insufficient documentation

## 2021-08-11 DIAGNOSIS — Z853 Personal history of malignant neoplasm of breast: Secondary | ICD-10-CM | POA: Diagnosis not present

## 2021-08-11 DIAGNOSIS — Z8673 Personal history of transient ischemic attack (TIA), and cerebral infarction without residual deficits: Secondary | ICD-10-CM | POA: Insufficient documentation

## 2021-08-11 DIAGNOSIS — Z9221 Personal history of antineoplastic chemotherapy: Secondary | ICD-10-CM | POA: Insufficient documentation

## 2021-08-11 DIAGNOSIS — D500 Iron deficiency anemia secondary to blood loss (chronic): Secondary | ICD-10-CM

## 2021-08-11 DIAGNOSIS — E785 Hyperlipidemia, unspecified: Secondary | ICD-10-CM | POA: Insufficient documentation

## 2021-08-11 DIAGNOSIS — Z85048 Personal history of other malignant neoplasm of rectum, rectosigmoid junction, and anus: Secondary | ICD-10-CM | POA: Insufficient documentation

## 2021-08-11 DIAGNOSIS — I739 Peripheral vascular disease, unspecified: Secondary | ICD-10-CM | POA: Diagnosis not present

## 2021-08-11 DIAGNOSIS — D509 Iron deficiency anemia, unspecified: Secondary | ICD-10-CM | POA: Insufficient documentation

## 2021-08-11 DIAGNOSIS — M1990 Unspecified osteoarthritis, unspecified site: Secondary | ICD-10-CM | POA: Insufficient documentation

## 2021-08-11 DIAGNOSIS — I4891 Unspecified atrial fibrillation: Secondary | ICD-10-CM | POA: Insufficient documentation

## 2021-08-11 LAB — CBC WITH DIFFERENTIAL (CANCER CENTER ONLY)
Abs Immature Granulocytes: 0.11 10*3/uL — ABNORMAL HIGH (ref 0.00–0.07)
Basophils Absolute: 0 10*3/uL (ref 0.0–0.1)
Basophils Relative: 0 %
Eosinophils Absolute: 0 10*3/uL (ref 0.0–0.5)
Eosinophils Relative: 1 %
HCT: 27.4 % — ABNORMAL LOW (ref 36.0–46.0)
Hemoglobin: 8.5 g/dL — ABNORMAL LOW (ref 12.0–15.0)
Immature Granulocytes: 2 %
Lymphocytes Relative: 16 %
Lymphs Abs: 0.8 10*3/uL (ref 0.7–4.0)
MCH: 26.9 pg (ref 26.0–34.0)
MCHC: 31 g/dL (ref 30.0–36.0)
MCV: 86.7 fL (ref 80.0–100.0)
Monocytes Absolute: 1.4 10*3/uL — ABNORMAL HIGH (ref 0.1–1.0)
Monocytes Relative: 26 %
Neutro Abs: 3 10*3/uL (ref 1.7–7.7)
Neutrophils Relative %: 55 %
Platelet Count: 378 10*3/uL (ref 150–400)
RBC: 3.16 MIL/uL — ABNORMAL LOW (ref 3.87–5.11)
RDW: 16.2 % — ABNORMAL HIGH (ref 11.5–15.5)
WBC Count: 5.4 10*3/uL (ref 4.0–10.5)
nRBC: 0 % (ref 0.0–0.2)

## 2021-08-11 LAB — IRON AND IRON BINDING CAPACITY (CC-WL,HP ONLY)
Iron: 14 ug/dL — ABNORMAL LOW (ref 28–170)
Saturation Ratios: 6 % — ABNORMAL LOW (ref 10.4–31.8)
TIBC: 239 ug/dL — ABNORMAL LOW (ref 250–450)
UIBC: 225 ug/dL (ref 148–442)

## 2021-08-11 LAB — CMP (CANCER CENTER ONLY)
ALT: <5 U/L (ref 0–44)
AST: 8 U/L — ABNORMAL LOW (ref 15–41)
Albumin: 3.2 g/dL — ABNORMAL LOW (ref 3.5–5.0)
Alkaline Phosphatase: 73 U/L (ref 38–126)
Anion gap: 6 (ref 5–15)
BUN: 12 mg/dL (ref 8–23)
CO2: 28 mmol/L (ref 22–32)
Calcium: 10.7 mg/dL — ABNORMAL HIGH (ref 8.9–10.3)
Chloride: 101 mmol/L (ref 98–111)
Creatinine: 0.82 mg/dL (ref 0.44–1.00)
GFR, Estimated: >60 mL/min (ref >60)
Glucose, Bld: 122 mg/dL — ABNORMAL HIGH (ref 70–99)
Potassium: 4 mmol/L (ref 3.5–5.1)
Sodium: 135 mmol/L (ref 135–145)
Total Bilirubin: 0.4 mg/dL (ref 0.3–1.2)
Total Protein: 7.5 g/dL (ref 6.5–8.1)

## 2021-08-11 LAB — RETIC PANEL
Immature Retic Fract: 20.1 % — ABNORMAL HIGH (ref 2.3–15.9)
RBC.: 3.16 MIL/uL — ABNORMAL LOW (ref 3.87–5.11)
Retic Count, Absolute: 53.1 10*3/uL (ref 19.0–186.0)
Retic Ct Pct: 1.7 % (ref 0.4–3.1)
Reticulocyte Hemoglobin: 22 pg — ABNORMAL LOW (ref >27.9)

## 2021-08-11 LAB — C-REACTIVE PROTEIN: CRP: 16.3 mg/dL — ABNORMAL HIGH (ref <1.0)

## 2021-08-11 LAB — FOLATE: Folate: 10.9 ng/mL (ref >5.9)

## 2021-08-11 LAB — SEDIMENTATION RATE: Sed Rate: 106 mm/hr — ABNORMAL HIGH (ref 0–22)

## 2021-08-11 LAB — VITAMIN B12: Vitamin B-12: 383 pg/mL (ref 180–914)

## 2021-08-11 LAB — FERRITIN: Ferritin: 130 ng/mL (ref 11–307)

## 2021-08-16 ENCOUNTER — Telehealth: Payer: Self-pay | Admitting: Hematology and Oncology

## 2021-08-16 NOTE — Progress Notes (Signed)
CT shows a small left inferior adenoma.  However, recent labs on 08/04/2021 by Dr. Joylene Draft show a normal calcium level of 9.9 and a normal Vit D of 34.6.  At this time, patient should remain on Vit D.  Will plan to see her in my office in 6 months with a new calcium level, PTH level, and Vit D level.  No plans for surgery at this time.  [place], MD [facility] [place] [facility] Office: [phone]

## 2021-08-16 NOTE — Telephone Encounter (Signed)
Sch per 2/7 inbasket, pt aware;requested calendar

## 2021-08-18 ENCOUNTER — Encounter (HOSPITAL_COMMUNITY)
Admission: RE | Admit: 2021-08-18 | Discharge: 2021-08-18 | Disposition: A | Discharge status: Home / Self Care | Payer: Medicare Other | Source: Ambulatory Visit | Attending: Internal Medicine | Admitting: Internal Medicine

## 2021-08-18 DIAGNOSIS — D649 Anemia, unspecified: Secondary | ICD-10-CM | POA: Insufficient documentation

## 2021-08-18 MED ORDER — SODIUM CHLORIDE 0.9 % IV SOLN
510.0000 mg | INTRAVENOUS | Status: DC
  Administered 2021-08-18: 510 mg via INTRAVENOUS
  Filled 2021-08-18: qty 510

## 2021-08-24 ENCOUNTER — Other Ambulatory Visit: Payer: Self-pay

## 2021-08-24 ENCOUNTER — Encounter (HOSPITAL_COMMUNITY)
Admission: RE | Admit: 2021-08-24 | Discharge: 2021-08-24 | Disposition: A | Discharge status: Home / Self Care | Payer: Medicare Other | Source: Ambulatory Visit | Attending: Internal Medicine | Admitting: Internal Medicine

## 2021-08-24 DIAGNOSIS — D649 Anemia, unspecified: Secondary | ICD-10-CM | POA: Diagnosis not present

## 2021-08-24 MED ORDER — SODIUM CHLORIDE 0.9 % IV SOLN
510.0000 mg | INTRAVENOUS | Status: AC
  Administered 2021-08-24: 510 mg via INTRAVENOUS
  Filled 2021-08-24: qty 510

## 2021-08-25 ENCOUNTER — Encounter (HOSPITAL_COMMUNITY): Payer: Medicare Other

## 2021-09-12 ENCOUNTER — Emergency Department (HOSPITAL_COMMUNITY)
Admission: EM | Admit: 2021-09-12 | Discharge: 2021-09-12 | Discharge status: Against Medical Advice | Payer: Medicare Other | Attending: Emergency Medicine | Admitting: Emergency Medicine

## 2021-09-12 ENCOUNTER — Other Ambulatory Visit: Payer: Self-pay

## 2021-09-12 DIAGNOSIS — R079 Chest pain, unspecified: Secondary | ICD-10-CM | POA: Diagnosis not present

## 2021-09-12 DIAGNOSIS — Z5321 Procedure and treatment not carried out due to patient leaving prior to being seen by health care provider: Secondary | ICD-10-CM | POA: Diagnosis not present

## 2021-09-12 DIAGNOSIS — Z7901 Long term (current) use of anticoagulants: Secondary | ICD-10-CM | POA: Insufficient documentation

## 2021-09-12 DIAGNOSIS — R002 Palpitations: Secondary | ICD-10-CM | POA: Insufficient documentation

## 2021-09-12 DIAGNOSIS — R0789 Other chest pain: Secondary | ICD-10-CM

## 2021-09-12 LAB — BASIC METABOLIC PANEL
Anion gap: 7 (ref 5–15)
BUN: 17 mg/dL (ref 8–23)
CO2: 26 mmol/L (ref 22–32)
Calcium: 10.4 mg/dL — ABNORMAL HIGH (ref 8.9–10.3)
Chloride: 101 mmol/L (ref 98–111)
Creatinine, Ser: 0.79 mg/dL (ref 0.44–1.00)
GFR, Estimated: >60 mL/min (ref >60)
Glucose, Bld: 159 mg/dL — ABNORMAL HIGH (ref 70–99)
Potassium: 4 mmol/L (ref 3.5–5.1)
Sodium: 134 mmol/L — ABNORMAL LOW (ref 135–145)

## 2021-09-12 LAB — CBC
HCT: 35.1 % — ABNORMAL LOW (ref 36.0–46.0)
Hemoglobin: 10.8 g/dL — ABNORMAL LOW (ref 12.0–15.0)
MCH: 29.3 pg (ref 26.0–34.0)
MCHC: 30.8 g/dL (ref 30.0–36.0)
MCV: 95.4 fL (ref 80.0–100.0)
Platelets: 261 10*3/uL (ref 150–400)
RBC: 3.68 MIL/uL — ABNORMAL LOW (ref 3.87–5.11)
RDW: 22 % — ABNORMAL HIGH (ref 11.5–15.5)
WBC: 7.4 10*3/uL (ref 4.0–10.5)
nRBC: 0 % (ref 0.0–0.2)

## 2021-09-12 LAB — TROPONIN I (HIGH SENSITIVITY): Troponin I (High Sensitivity): 7 ng/L (ref <18)

## 2021-09-12 NOTE — ED Triage Notes (Signed)
Pt woke up with chest pressure and palpitations around 0900 today. Hx of afib on Eliquis and felt like she was in it so called EMS but declined transport. Took two nitro without a complete relief. ?

## 2021-09-12 NOTE — ED Notes (Signed)
Called x 3 NO answer 

## 2021-09-12 NOTE — ED Provider Triage Note (Signed)
Emergency Medicine Provider Triage Evaluation Note ? ?CHIVONNE RASCON , a 87 y.o. female  was evaluated in triage.  Pt complains of feeling sudden onset of palpitations and a rapid heartbeat this morning around 9 AM.  Believes she was in A-fib.  History of paroxysmal A-fib.  Felt irregular up until shortly before she was evaluated.  No longer feels palpitations or chest discomfort.  On Eliquis.  Took 2 nitro prior to arrival.  Denies shortness of breath or fever or recent illness. ? ?Review of Systems  ?Positive: As above ?Negative: As above ? ?Physical Exam  ?BP 137/87 (BP Location: Left Arm)   Pulse 82   Temp (!) 97.5 ?F (36.4 ?C) (Oral)   Resp 18   SpO2 96%  ?Gen:   Awake, no distress   ?Resp:  Normal effort, CTAB ?MSK:   Moves extremities without difficulty  ?Other:  RRR without M/R/G, possible premature contractions ? ?Medical Decision Making  ?Medically screening exam initiated at 5:13 PM.  Appropriate orders placed.  DENNA FRYBERGER was informed that the remainder of the evaluation will be completed by another provider, this initial triage assessment does not replace that evaluation, and the importance of remaining in the ED until their evaluation is complete. ? ?Labs, EKG, imaging ?  ?Prince Rome, PA-C ?62/37/62 1722 ? ?

## 2021-09-14 ENCOUNTER — Telehealth: Payer: Self-pay

## 2021-09-14 NOTE — Telephone Encounter (Signed)
RNCM received call from patient regarding her recent ED visit. Patient reports her doctor was notified that she left without being treated. Patient reports she does not want to have that on her record, she worked for [employer] for over 30 years. Patient states she spoke with the PA and was never given any discharge information. Patient reports someone called her from the ED to tell her that she didn't receive her discharge/AVS information. Patient reports noone told her about discharge papers. ?Per chart review PA advised patient to remain in ED to complete evaluation. It seems patient may have been unclear about next steps for treatment. This RNCM will notify my leadership of incident. No additional TOC needs at this time. ?

## 2021-10-13 ENCOUNTER — Inpatient Hospital Stay (HOSPITAL_BASED_OUTPATIENT_CLINIC_OR_DEPARTMENT_OTHER): Payer: Medicare Other | Admitting: Hematology and Oncology

## 2021-10-13 ENCOUNTER — Other Ambulatory Visit: Payer: Self-pay

## 2021-10-13 ENCOUNTER — Other Ambulatory Visit: Payer: Self-pay | Admitting: Hematology and Oncology

## 2021-10-13 ENCOUNTER — Inpatient Hospital Stay: Payer: Medicare Other | Attending: Hematology and Oncology

## 2021-10-13 VITALS — BP 111/64 | HR 86 | Temp 97.8°F | Wt 138.1 lb

## 2021-10-13 DIAGNOSIS — D500 Iron deficiency anemia secondary to blood loss (chronic): Secondary | ICD-10-CM

## 2021-10-13 DIAGNOSIS — D509 Iron deficiency anemia, unspecified: Secondary | ICD-10-CM | POA: Insufficient documentation

## 2021-10-13 DIAGNOSIS — I4891 Unspecified atrial fibrillation: Secondary | ICD-10-CM | POA: Insufficient documentation

## 2021-10-13 DIAGNOSIS — Z7901 Long term (current) use of anticoagulants: Secondary | ICD-10-CM | POA: Insufficient documentation

## 2021-10-13 LAB — CBC WITH DIFFERENTIAL (CANCER CENTER ONLY)
Abs Immature Granulocytes: 0.06 10*3/uL (ref 0.00–0.07)
Basophils Absolute: 0 10*3/uL (ref 0.0–0.1)
Basophils Relative: 0 %
Eosinophils Absolute: 0.1 10*3/uL (ref 0.0–0.5)
Eosinophils Relative: 2 %
HCT: 33.8 % — ABNORMAL LOW (ref 36.0–46.0)
Hemoglobin: 10.3 g/dL — ABNORMAL LOW (ref 12.0–15.0)
Immature Granulocytes: 1 %
Lymphocytes Relative: 14 %
Lymphs Abs: 0.8 10*3/uL (ref 0.7–4.0)
MCH: 30.3 pg (ref 26.0–34.0)
MCHC: 30.5 g/dL (ref 30.0–36.0)
MCV: 99.4 fL (ref 80.0–100.0)
Monocytes Absolute: 1 10*3/uL (ref 0.1–1.0)
Monocytes Relative: 17 %
Neutro Abs: 3.7 10*3/uL (ref 1.7–7.7)
Neutrophils Relative %: 66 %
Platelet Count: 300 10*3/uL (ref 150–400)
RBC: 3.4 MIL/uL — ABNORMAL LOW (ref 3.87–5.11)
RDW: 18.6 % — ABNORMAL HIGH (ref 11.5–15.5)
WBC Count: 5.6 10*3/uL (ref 4.0–10.5)
nRBC: 0 % (ref 0.0–0.2)

## 2021-10-13 LAB — CMP (CANCER CENTER ONLY)
ALT: 5 U/L (ref 0–44)
AST: 7 U/L — ABNORMAL LOW (ref 15–41)
Albumin: 3.3 g/dL — ABNORMAL LOW (ref 3.5–5.0)
Alkaline Phosphatase: 62 U/L (ref 38–126)
Anion gap: 5 (ref 5–15)
BUN: 13 mg/dL (ref 8–23)
CO2: 28 mmol/L (ref 22–32)
Calcium: 10.9 mg/dL — ABNORMAL HIGH (ref 8.9–10.3)
Chloride: 103 mmol/L (ref 98–111)
Creatinine: 0.8 mg/dL (ref 0.44–1.00)
GFR, Estimated: >60 mL/min (ref >60)
Glucose, Bld: 127 mg/dL — ABNORMAL HIGH (ref 70–99)
Potassium: 4.4 mmol/L (ref 3.5–5.1)
Sodium: 136 mmol/L (ref 135–145)
Total Bilirubin: 0.4 mg/dL (ref 0.3–1.2)
Total Protein: 7 g/dL (ref 6.5–8.1)

## 2021-10-13 LAB — RETIC PANEL
Immature Retic Fract: 13.1 % (ref 2.3–15.9)
RBC.: 3.38 MIL/uL — ABNORMAL LOW (ref 3.87–5.11)
Retic Count, Absolute: 56.1 10*3/uL (ref 19.0–186.0)
Retic Ct Pct: 1.7 % (ref 0.4–3.1)
Reticulocyte Hemoglobin: 29.7 pg (ref >27.9)

## 2021-10-13 LAB — IRON AND IRON BINDING CAPACITY (CC-WL,HP ONLY)
Iron: 32 ug/dL (ref 28–170)
Saturation Ratios: 16 % (ref 10.4–31.8)
TIBC: 206 ug/dL — ABNORMAL LOW (ref 250–450)
UIBC: 174 ug/dL (ref 148–442)

## 2021-10-13 MED ORDER — FERROUS SULFATE 325 (65 FE) MG PO TABS: 325.0000 mg | ORAL_TABLET | Freq: Every day | ORAL | 3 refills | Status: DC

## 2021-10-16 LAB — FERRITIN: Ferritin: 502 ng/mL — ABNORMAL HIGH (ref 11–307)

## 2021-10-23 ENCOUNTER — Telehealth: Payer: Self-pay | Admitting: *Deleted

## 2021-10-23 ENCOUNTER — Telehealth: Payer: Self-pay | Admitting: Hematology and Oncology

## 2021-10-23 NOTE — Telephone Encounter (Signed)
TCT patient regarding recent lab results. Spoke with pt and advised that her iron levels have come up nicely. Dr. Lorenso Courier recommends she continue her iron supplement. Advised we will see her back in 3 months. Pt is aware of her appt in July. ?No questions or concerns. ?

## 2021-10-23 NOTE — Progress Notes (Signed)
[name] ?Telephone:([phone]   [phone] ? ?PROGRESS NOTE ? ?Patient Care Team: ?Crist Infante, MD as PCP - General (Internal Medicine) ? ?Hematological/Oncological History ? ?# Iron Deficiency Anemia 2/2 Unclear Etiology  ?# Anemia of Chronic Disease/Inflammation  ?08/04/2021: WBC 7.05, Hgb 8.7, MCV 82.7, Plt 407. Ferritin 149, Iron sat 9%, TIBC 203, Iron 19.  Cr 0.7 ?08/11/2021: establish care with Dr. Lorenso Courier ?08/18/2021-08/25/2021: Patient received IV Feraheme 510 mg x 2 doses ? ?Interval History:  ?Jeanette [name] 87 y.o. female with medical history significant for iron deficiency anemia of unclear etiology who presents for a follow up visit. The patient's last visit was on 08/11/2021 at which time she established care. In the interim since the last visit she received IV Feraheme x2 doses in February. ? ?On exam today Jeanette [name] reports that the iron infusions "went well".  She notes that life is still "rough".  She notes that she has also been treated with prednisone therapy and either the IV iron or the steroids caused "a big difference".  She suspects it was the prednisone.  She notes that she does not feel as strong.  She reports that she is not having any issues with bleeding, bruising, or dark stools.  She does that she has been having trouble eating adequately lately.  She does that because of "long COVID" she had a change in her taste and has continued to lose weight.  Fortunately she has gained about 5 pounds in the interim since her last visit.  She has been taking ferrous sulfate 325 mg p.o. daily and tolerating it well.  Otherwise she currently denies any fevers, chills, sweats, nausea, ming or diarrhea.  Full 10 point ROS is listed below. ? ?MEDICAL HISTORY:  ?Past Medical History:  ?Diagnosis Date  ? A-fib ([place]) 03/15/2018  ? Anal cancer ([place])   ? Anxiety   ? Arthritis   ? Breast cancer ([place])   ? Breast cancer ([place])   ? Cancer of breast ([place])   ? Depression   ? Dysrhythmia  2010  ? a-fib  ? Hyperlipidemia   ? Hypertension   ? PVD (peripheral vascular disease) ([place])   ? RAS (renal artery stenosis) ([place])   ? Stroke [facility]) 2019  ? no residual, x 2  ? ? ?SURGICAL HISTORY: ?Past Surgical History:  ?Procedure Laterality Date  ? BREAST BIOPSY Left 01/14/2003  ? BREAST LUMPECTOMY Right 1989  ? CARDIOVASCULAR STRESS TEST  07/27/2003  ? No evidence if LV myocardial ischemia or scar. LV EF 74%.  ? GYNECOLOGIC CRYOSURGERY    ? IR CT HEAD LTD  12/04/2019  ? IR PERCUTANEOUS ART THROMBECTOMY/INFUSION INTRACRANIAL INC DIAG ANGIO  12/04/2019  ? IR US GUIDE VASC ACCESS LEFT  12/04/2019  ? OPEN REDUCTION INTERNAL FIXATION (ORIF) DISTAL RADIAL FRACTURE Left 02/19/2017  ? Procedure: OPEN REDUCTION INTERNAL FIXATION (ORIF) LEFT DISTAL RADIAL FRACTURE;  Surgeon: Leanora Cover, MD;  Location: [facility];  Service: Orthopedics;  Laterality: Left;  ? OVARIAN CYST REMOVAL    ? RADIOLOGY WITH ANESTHESIA N/A 12/04/2019  ? Procedure: IR WITH ANESTHESIA;  Surgeon: Luanne Bras, MD;  Location: [facility];  Service: Radiology;  Laterality: N/A;  ? RENAL DOPPLER  09/09/2012  ? Right proximal renal artery: 60-99% diameter reduction. Normal patency of the left main renal artery.   ? TRANSTHORACIC ECHOCARDIOGRAM  03/22/2006  ? EF 55-60%, mild mitral valvular regurg, pulmonary artery systolic pressure was moderately increased.  ? TUBAL LIGATION    ? ? ?  SOCIAL HISTORY: ?Social History  ? ?Socioeconomic History  ? Marital status: Married  ?  Spouse name: Delfino Lovett  ? Number of children: 4  ? Years of education: Not on file  ? Highest education level: Not on file  ?Occupational History  ? 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 Smoking status: Never  ? Smokeless tobacco: Never  ?Vaping Use  ? Vaping Use: Never used  ?Substance and Sexual Activity  ? Alcohol use: Yes  ?  Alcohol/week: 2.0 standard drinks  ?  Types: 2 Glasses of wine per week  ?  Comment: rare  ? Drug use: No  ? Sexual activity: Yes   ?  Partners: Male  ?  Birth control/protection: Post-menopausal  ?Other Topics Concern  ? Not on file  ?Social History Narrative  ? Lives with spouse  ? ?Social Determinants of Health  ? ?Financial Resource Strain: Not on file  ?Food Insecurity: Not on file  ?Transportation Needs: Not on file  ?Physical Activity: Not on file  ?Stress: Not on file  ?Social Connections: Not on file  ?Intimate Partner Violence: Not on file  ? ? ?FAMILY HISTORY: ?Family History  ?Problem Relation Age of Onset  ? Alzheimer's disease Mother   ? Pneumonia Mother   ? Heart failure Father   ? Heart disease Sister   ? Heart disease Brother   ? Heart disease Brother   ? Heart disease Sister   ? Pneumonia Sister   ? ? ?ALLERGIES:  is allergic to epinephrine, cozaar [losartan], crestor [rosuvastatin], septra [sulfamethoxazole-trimethoprim], vytorin [ezetimibe-simvastatin], benazepril hcl, crestor [rosuvastatin calcium], and lisinopril. ? ?MEDICATIONS:  ?Current Outpatient Medications  ?Medication Sig Dispense Refill  ? ferrous sulfate 325 (65 FE) MG tablet Take 1 tablet (325 mg total) by mouth daily with breakfast. Please take with a source of Vitamin C 90 tablet 3  ? apixaban (ELIQUIS) 5 MG TABS tablet Take 1 tablet (5 mg total) by mouth 2 (two) times daily. 60 tablet 1  ? cholecalciferol (VITAMIN D) 1000 units tablet Take 1,000 Units by mouth daily.    ? diclofenac Sodium (VOLTAREN) 1 % GEL Apply topically 4 (four) times daily.    ? escitalopram (LEXAPRO) 10 MG tablet Take 10 mg by mouth daily.    ? gabapentin (NEURONTIN) 100 MG capsule Take 100 mg by mouth as needed.    ? HYDROcodone-acetaminophen (NORCO/VICODIN) 5-325 MG tablet Take 1 tablet by mouth every 6 (six) hours as needed for moderate pain. Taking 1/2 as needed    ? metoprolol (TOPROL-XL) 200 MG 24 hr tablet Take 100 mg by mouth 2 (two) times daily.    ? nitroGLYCERIN (NITROSTAT) 0.4 MG SL tablet PLACE 1 TABLET (0.4 MG TOTAL) UNDER THE TONGUE EVERY 5 (FIVE) MINUTES AS NEEDED FOR  CHEST PAIN. 75 tablet 1  ? olmesartan (BENICAR) 20 MG tablet Take 10 mg by mouth daily.    ? REPATHA SURECLICK 950 MG/ML SOAJ Inject 140 mg into the skin See admin instructions. Take '140mg'$  SQ every two weeks per patient    ? vitamin B-12 (CYANOCOBALAMIN) 100 MCG tablet Take 50 mcg by mouth daily.    ? ?No current facility-administered medications for this visit.  ? ? ?REVIEW OF SYSTEMS:   ?Constitutional: ( - ) fevers, ( - )  chills , ( - ) night sweats ?Eyes: ( - ) blurriness of vision, ( - ) double vision, ( - ) watery eyes ?Ears, nose, mouth, throat, and  face: ( - ) mucositis, ( - ) sore throat ?Respiratory: ( - ) cough, ( - ) dyspnea, ( - ) wheezes ?Cardiovascular: ( - ) palpitation, ( - ) chest discomfort, ( - ) lower extremity swelling ?Gastrointestinal:  ( - ) nausea, ( - ) heartburn, ( - ) change in bowel habits ?Skin: ( - ) abnormal skin rashes ?Lymphatics: ( - ) new lymphadenopathy, ( - ) easy bruising ?Neurological: ( - ) numbness, ( - ) tingling, ( - ) new weaknesses ?Behavioral/Psych: ( - ) mood change, ( - ) new changes  ?All other systems were reviewed with the patient and are negative. ? ?PHYSICAL EXAMINATION: ? ?Vitals:  ? 10/13/21 1419  ?BP: 111/64  ?Pulse: 86  ?Temp: 97.8 ?F (36.6 ?C)  ?SpO2: 100%  ? ?Filed Weights  ? 10/13/21 1419  ?Weight: 138 lb 1.6 oz (62.6 kg)  ? ? ?GENERAL: Well-appearing elderly Caucasian female, alert, no distress and comfortable ?SKIN: skin color, texture, turgor are normal, no rashes or significant lesions ?EYES: conjunctiva are pink and non-injected, sclera clear ?LUNGS: clear to auscultation and percussion with normal breathing effort ?HEART: regular rate & rhythm and no murmurs and no lower extremity edema ?Musculoskeletal: no cyanosis of digits and no clubbing  ?PSYCH: alert & oriented x 3, fluent speech ?NEURO: no focal motor/sensory deficits ? ?LABORATORY DATA:  ?I have reviewed the data as listed ? ?  Latest Ref Rng & Units 10/13/2021  ?  2:01 PM 09/12/2021  ?  5:02  PM 08/11/2021  ? 10:05 AM  ?CBC  ?WBC 4.0 - 10.5 K/uL 5.6   7.4   5.4    ?Hemoglobin 12.0 - 15.0 g/dL 10.3   10.8   8.5    ?Hematocrit 36.0 - 46.0 % 33.8   35.1   27.4    ?Platelets 150 - 400 K/uL 300   26

## 2021-10-23 NOTE — Telephone Encounter (Signed)
Per 4/17 in basket called and spoke to pt about appointment.  Pt confirmed appointment  ?

## 2021-10-23 NOTE — Telephone Encounter (Signed)
-----   Message from Orson Slick, MD sent at 10/23/2021  2:34 PM EDT ----- ?Please let Jeanette [name] know that her labs show her iron levels are adequately repleted.  She does have high levels of inflammatory markers which are likely contributing to her anemia.  I would recommend that we see her back in approximately 3 months time in order to reassess.  In the meantime recommend that she continue her p.o. iron therapy.  If she has any changes in her health in the interim we will be happy to see her back sooner. ? ?----- Message ----- ?From: Interface, Lab In [place] ?Sent: 10/13/2021   2:07 PM EDT ?To: Orson Slick, MD ? ? ?

## 2021-12-22 ENCOUNTER — Other Ambulatory Visit: Payer: Self-pay | Admitting: Internal Medicine

## 2021-12-22 DIAGNOSIS — Z1231 Encounter for screening mammogram for malignant neoplasm of breast: Secondary | ICD-10-CM

## 2022-01-01 ENCOUNTER — Ambulatory Visit
Admission: RE | Admit: 2022-01-01 | Discharge: 2022-01-01 | Disposition: A | Discharge status: Home / Self Care | Payer: Medicare Other | Source: Ambulatory Visit | Attending: Internal Medicine | Admitting: Internal Medicine

## 2022-01-01 DIAGNOSIS — Z1231 Encounter for screening mammogram for malignant neoplasm of breast: Secondary | ICD-10-CM

## 2022-01-22 ENCOUNTER — Other Ambulatory Visit: Payer: Self-pay | Admitting: Hematology and Oncology

## 2022-01-22 ENCOUNTER — Other Ambulatory Visit: Payer: Self-pay

## 2022-01-22 ENCOUNTER — Inpatient Hospital Stay: Payer: Medicare Other | Attending: Hematology and Oncology

## 2022-01-22 ENCOUNTER — Inpatient Hospital Stay: Payer: Medicare Other | Admitting: Hematology and Oncology

## 2022-01-22 VITALS — BP 139/71 | HR 73 | Temp 97.7°F | Resp 15 | Wt 133.1 lb

## 2022-01-22 DIAGNOSIS — D500 Iron deficiency anemia secondary to blood loss (chronic): Secondary | ICD-10-CM | POA: Diagnosis not present

## 2022-01-22 DIAGNOSIS — Z7901 Long term (current) use of anticoagulants: Secondary | ICD-10-CM | POA: Insufficient documentation

## 2022-01-22 DIAGNOSIS — D509 Iron deficiency anemia, unspecified: Secondary | ICD-10-CM | POA: Diagnosis not present

## 2022-01-22 DIAGNOSIS — D638 Anemia in other chronic diseases classified elsewhere: Secondary | ICD-10-CM | POA: Diagnosis not present

## 2022-01-22 LAB — CMP (CANCER CENTER ONLY)
ALT: <5 U/L (ref 0–44)
AST: 8 U/L — ABNORMAL LOW (ref 15–41)
Albumin: 3.3 g/dL — ABNORMAL LOW (ref 3.5–5.0)
Alkaline Phosphatase: 56 U/L (ref 38–126)
Anion gap: 3 — ABNORMAL LOW (ref 5–15)
BUN: 12 mg/dL (ref 8–23)
CO2: 30 mmol/L (ref 22–32)
Calcium: 10.7 mg/dL — ABNORMAL HIGH (ref 8.9–10.3)
Chloride: 107 mmol/L (ref 98–111)
Creatinine: 0.68 mg/dL (ref 0.44–1.00)
GFR, Estimated: >60 mL/min (ref >60)
Glucose, Bld: 111 mg/dL — ABNORMAL HIGH (ref 70–99)
Potassium: 3.5 mmol/L (ref 3.5–5.1)
Sodium: 140 mmol/L (ref 135–145)
Total Bilirubin: 0.3 mg/dL (ref 0.3–1.2)
Total Protein: 6.6 g/dL (ref 6.5–8.1)

## 2022-01-22 LAB — CBC WITH DIFFERENTIAL (CANCER CENTER ONLY)
Abs Immature Granulocytes: 0.05 10*3/uL (ref 0.00–0.07)
Basophils Absolute: 0 10*3/uL (ref 0.0–0.1)
Basophils Relative: 0 %
Eosinophils Absolute: 0 10*3/uL (ref 0.0–0.5)
Eosinophils Relative: 1 %
HCT: 29.7 % — ABNORMAL LOW (ref 36.0–46.0)
Hemoglobin: 9.5 g/dL — ABNORMAL LOW (ref 12.0–15.0)
Immature Granulocytes: 1 %
Lymphocytes Relative: 22 %
Lymphs Abs: 1.1 10*3/uL (ref 0.7–4.0)
MCH: 32.2 pg (ref 26.0–34.0)
MCHC: 32 g/dL (ref 30.0–36.0)
MCV: 100.7 fL — ABNORMAL HIGH (ref 80.0–100.0)
Monocytes Absolute: 1.1 10*3/uL — ABNORMAL HIGH (ref 0.1–1.0)
Monocytes Relative: 22 %
Neutro Abs: 2.7 10*3/uL (ref 1.7–7.7)
Neutrophils Relative %: 54 %
Platelet Count: 284 10*3/uL (ref 150–400)
RBC: 2.95 MIL/uL — ABNORMAL LOW (ref 3.87–5.11)
RDW: 14.1 % (ref 11.5–15.5)
WBC Count: 4.9 10*3/uL (ref 4.0–10.5)
nRBC: 0 % (ref 0.0–0.2)

## 2022-01-22 LAB — RETIC PANEL
Immature Retic Fract: 12.6 % (ref 2.3–15.9)
RBC.: 3.06 MIL/uL — ABNORMAL LOW (ref 3.87–5.11)
Retic Count, Absolute: 48.3 10*3/uL (ref 19.0–186.0)
Retic Ct Pct: 1.6 % (ref 0.4–3.1)
Reticulocyte Hemoglobin: 30.3 pg (ref >27.9)

## 2022-01-22 LAB — IRON AND IRON BINDING CAPACITY (CC-WL,HP ONLY)
Iron: 27 ug/dL — ABNORMAL LOW (ref 28–170)
Saturation Ratios: 14 % (ref 10.4–31.8)
TIBC: 193 ug/dL — ABNORMAL LOW (ref 250–450)
UIBC: 166 ug/dL (ref 148–442)

## 2022-01-22 NOTE — Progress Notes (Signed)
[facility] [phone]   [phone]  PROGRESS NOTE  Patient Care Team: Rodrigo Ran, MD as PCP - General (Internal Medicine)  Hematological/Oncological History  # Iron Deficiency Anemia 2/2 Unclear Etiology  # Anemia of Chronic Disease/Inflammation  08/04/2021: WBC 7.05, Hgb 8.7, MCV 82.7, Plt 407. Ferritin 149, Iron sat 9%, TIBC 203, Iron 19.  Cr 0.7 08/11/2021: establish care with Dr. Leonides Schanz 08/18/2021-08/25/2021: Patient received IV Feraheme 510 mg x 2 doses 01/22/2022: White blood cell count 4.9, hemoglobin 9.5, MCV 100.7, platelets of 284.  Currently has iron sat of 14% with ferritin of 199  Interval History:  Jeanette [name] 87 y.o. female with medical history significant for iron deficiency anemia of unclear etiology who presents for a follow up visit. The patient's last visit was on 10/13/2021 at which time she established care. In the interim since the last visit she has had no major changes in her health.   On exam today Jeanette [name] reports he continues to struggle with her arthritis.  She notes that she is currently on a prednisone taper and currently on 5 mg p.o. daily.  She notes that she "cannot say if the steroids of helped".  She notes that she is not feeling considerably stronger.  She underwent iron therapy in February 2023.  She reports that she continues to have some dizziness and weight loss and poor eating habits.  She notes that she has not been taking iron pills because they cause severe diarrhea.  She reports that she is also interested in discontinuing Repatha and will discuss this with her provider.  She notes that she feels her endurance is quite poor and her energy is only rated as a 4 out of 10.  She notes she is shortness of breath on occasion.  She also struggles with migraines and pains in her arms.  She reports that she likes licorice and that turns her bowel movements dark.  She does that she was previously seen by Dr. Matthias Hughs  gastroenterology but he has subsequently retired.  Otherwise she currently denies any fevers, chills, sweats, nausea, ming or diarrhea.  Full 10 point ROS is listed below.  MEDICAL HISTORY:  Past Medical History:  Diagnosis Date   A-fib (HCC) 03/15/2018   Anal cancer (HCC)    Anxiety    Arthritis    Breast cancer (HCC)    Breast cancer (HCC)    Cancer of breast (HCC)    Depression    Dysrhythmia 2010   a-fib   Hyperlipidemia    Hypertension    PVD (peripheral vascular disease) (HCC)    RAS (renal artery stenosis) (HCC)    Stroke (HCC) 2019   no residual, x 2    SURGICAL HISTORY: Past Surgical History:  Procedure Laterality Date   BREAST BIOPSY Left 01/14/2003   BREAST LUMPECTOMY Right 1989   CARDIOVASCULAR STRESS TEST  07/27/2003   No evidence if LV myocardial ischemia or scar. LV EF 74%.   GYNECOLOGIC CRYOSURGERY     IR CT HEAD LTD  12/04/2019   IR PERCUTANEOUS ART THROMBECTOMY/INFUSION INTRACRANIAL INC DIAG ANGIO  12/04/2019   IR US GUIDE VASC ACCESS LEFT  12/04/2019   OPEN REDUCTION INTERNAL FIXATION (ORIF) DISTAL RADIAL FRACTURE Left 02/19/2017   Procedure: OPEN REDUCTION INTERNAL FIXATION (ORIF) LEFT DISTAL RADIAL FRACTURE;  Surgeon: Betha Loa, MD;  Location: [facility];  Service: Orthopedics;  Laterality: Left;   OVARIAN CYST REMOVAL     RADIOLOGY WITH ANESTHESIA  N/A 12/04/2019   Procedure: IR WITH ANESTHESIA;  Surgeon: Julieanne Cotton, MD;  Location: [facility] OR;  Service: Radiology;  Laterality: N/A;   RENAL DOPPLER  09/09/2012   Right proximal renal artery: 60-99% diameter reduction. Normal patency of the left main renal artery.    TRANSTHORACIC ECHOCARDIOGRAM  03/22/2006   EF 55-60%, mild mitral valvular regurg, pulmonary artery systolic pressure was moderately increased.   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  Tobacco Use   Smoking status: Never   Smokeless tobacco: Never  Vaping Use   Vaping Use: Never used  Substance and Sexual Activity   Alcohol use: Yes    Alcohol/week: 2.0 standard drinks of alcohol    Types: 2 Glasses of wine per week    Comment: rare   Drug use: No   Sexual activity: Yes    Partners: Male    Birth control/protection: Post-menopausal  Other Topics Concern   Not on file  Social History Narrative   Lives with spouse   Social Determinants of Health   Financial Resource Strain: Not on file  Food Insecurity: Not on file  Transportation Needs: Not on file  Physical Activity: Not on file  Stress: Not on file  Social Connections: Not on file  Intimate Partner Violence: Not on file    FAMILY HISTORY: Family History  Problem Relation Age of Onset   Alzheimer's disease Mother    Pneumonia Mother    Heart failure Father    Heart disease Sister    Heart disease Brother    Heart disease Brother    Heart disease Sister    Pneumonia Sister     ALLERGIES:  is allergic to epinephrine, cozaar [losartan], crestor [rosuvastatin], septra [sulfamethoxazole-trimethoprim], vytorin [ezetimibe-simvastatin], benazepril hcl, crestor [rosuvastatin calcium], and lisinopril.  MEDICATIONS:  Current Outpatient Medications  Medication Sig Dispense Refill   apixaban (ELIQUIS) 5 MG TABS tablet Take 1 tablet (5 mg total) by mouth 2 (two) times daily. 60 tablet 1   cholecalciferol (VITAMIN D) 1000 units tablet Take 1,000 Units by mouth daily.     diclofenac Sodium (VOLTAREN) 1 % GEL Apply topically 4 (four) times daily.     escitalopram (LEXAPRO) 10 MG tablet Take 10 mg by mouth daily.     ferrous sulfate 325 (65 FE) MG tablet Take 1 tablet (325 mg total) by mouth daily with breakfast. Please take with a source of Vitamin C 90 tablet 3   gabapentin (NEURONTIN) 100  MG capsule Take 100 mg by mouth as needed.     HYDROcodone-acetaminophen (NORCO/VICODIN) 5-325 MG tablet Take 1 tablet by mouth every 6 (six) hours as needed for moderate pain. Taking 1/2 as needed     metoprolol (TOPROL-XL) 200 MG 24 hr tablet Take 100 mg by mouth 2 (two) times daily.     nitroGLYCERIN (NITROSTAT) 0.4 MG SL tablet PLACE 1 TABLET (0.4 MG TOTAL) UNDER THE TONGUE EVERY 5 (FIVE) MINUTES AS NEEDED FOR CHEST PAIN. 75 tablet 1   olmesartan (BENICAR) 20 MG tablet Take 10 mg by mouth daily.     REPATHA SURECLICK 140 MG/ML SOAJ Inject 140 mg into the skin See admin instructions. Take 140mg   SQ every two weeks per patient     vitamin B-12 (CYANOCOBALAMIN) 100 MCG tablet Take 50 mcg by mouth daily.     No current facility-administered medications for this visit.    REVIEW OF SYSTEMS:   Constitutional: ( - ) fevers, ( - )  chills , ( - ) night sweats Eyes: ( - ) blurriness of vision, ( - ) double vision, ( - ) watery eyes Ears, nose, mouth, throat, and face: ( - ) mucositis, ( - ) sore throat Respiratory: ( - ) cough, ( - ) dyspnea, ( - ) wheezes Cardiovascular: ( - ) palpitation, ( - ) chest discomfort, ( - ) lower extremity swelling Gastrointestinal:  ( - ) nausea, ( - ) heartburn, ( - ) change in bowel habits Skin: ( - ) abnormal skin rashes Lymphatics: ( - ) new lymphadenopathy, ( - ) easy bruising Neurological: ( - ) numbness, ( - ) tingling, ( - ) new weaknesses Behavioral/Psych: ( - ) mood change, ( - ) new changes  All other systems were reviewed with the patient and are negative.  PHYSICAL EXAMINATION:  Vitals:   01/22/22 1540  BP: 139/71  Pulse: 73  Resp: 15  Temp: 97.7 F (36.5 C)  SpO2: 100%   Filed Weights   01/22/22 1540  Weight: 133 lb 1.6 oz (60.4 kg)    GENERAL: Well-appearing elderly Caucasian female, alert, no distress and comfortable SKIN: skin color, texture, turgor are normal, no rashes or significant lesions EYES: conjunctiva are pink and  non-injected, sclera clear LUNGS: clear to auscultation and percussion with normal breathing effort HEART: regular rate & rhythm and no murmurs and no lower extremity edema Musculoskeletal: no cyanosis of digits and no clubbing  PSYCH: alert & oriented x 3, fluent speech NEURO: no focal motor/sensory deficits  LABORATORY DATA:  I have reviewed the data as listed    Latest Ref Rng & Units 01/22/2022    3:15 PM 10/13/2021    2:01 PM 09/12/2021    5:02 PM  CBC  WBC 4.0 - 10.5 K/uL 4.9  5.6  7.4   Hemoglobin 12.0 - 15.0 g/dL 9.5  11.9  14.7   Hematocrit 36.0 - 46.0 % 29.7  33.8  35.1   Platelets 150 - 400 K/uL 284  300  261        Latest Ref Rng & Units 01/22/2022    3:15 PM 10/13/2021    2:01 PM 09/12/2021    5:02 PM  CMP  Glucose 70 - 99 mg/dL 829  562  130   BUN 8 - 23 mg/dL 12  13  17    Creatinine 0.44 - 1.00 mg/dL 8.65  7.84  6.96   Sodium 135 - 145 mmol/L 140  136  134   Potassium 3.5 - 5.1 mmol/L 3.5  4.4  4.0   Chloride 98 - 111 mmol/L 107  103  101   CO2 22 - 32 mmol/L 30  28  26    Calcium 8.9 - 10.3 mg/dL 29.5  28.4  13.2   Total Protein 6.5 - 8.1 g/dL 6.6  7.0    Total Bilirubin 0.3 - 1.2 mg/dL 0.3  0.4    Alkaline Phos 38 - 126 U/L 56  62    AST 15 - 41 U/L 8  7    ALT 0 - 44 U/L <5  5      RADIOGRAPHIC STUDIES: MM 3D SCREEN BREAST BILATERAL  Result Date: 01/02/2022 CLINICAL DATA:  Screening. EXAM: DIGITAL SCREENING BILATERAL MAMMOGRAM WITH TOMOSYNTHESIS AND CAD TECHNIQUE: Bilateral screening digital craniocaudal and mediolateral oblique mammograms were obtained. Bilateral screening digital breast tomosynthesis was performed. The images were evaluated with computer-aided detection. COMPARISON:  None available. ACR Breast Density Category a: The breast tissue is almost entirely fatty. FINDINGS: There are no findings suspicious for malignancy. IMPRESSION: No mammographic evidence of malignancy. A result letter of this screening mammogram will be mailed directly to the  patient. RECOMMENDATION: Screening mammogram in one year. (Code:SM-B-01Y) BI-RADS CATEGORY  1: Negative. Electronically Signed   By: Elberta Fortis M.D.   On: 01/02/2022 13:33    ASSESSMENT & PLAN Jeanette [name] 87 y.o. female with medical history significant for iron deficiency anemia of unclear etiology who presents for a follow up visit.   # Iron Deficiency Anemia 2/2 Unclear Etiology  # Anemia of Chronic Disease/Inflammation  -- Findings are consistent with anemia of chronic disease with a likely component of iron deficiency anemia secondary to an unclear etiology --Patient had markedly elevated inflammatory markers previously and has known history of rheumatoid arthritis. --today will order iron panel and ferritin as well as reticulocytes, CBC, and CMP -- Patient unable to tolerate p.o. iron therapy due to diarrhea. -- Patient is status post 2 doses of IV Feraheme in February 2023 --Labs today show white blood cell count 4.9, hemoglobin 9.5, MCV 100.7, and platelets of 284 --iron labs currently pending.  --RTC in 3 months time to reassesses.   No orders of the defined types were placed in this encounter.   All questions were answered. The patient knows to call the clinic with any problems, questions or concerns.  A total of more than 30 minutes were spent on this encounter with face-to-face time and non-face-to-face time, including preparing to see the patient, ordering tests and/or medications, counseling the patient and coordination of care as outlined above.   Ulysees Barns, MD Department of Hematology/Oncology [facility] Phone: [phone] Pager: [phone] Email: [email or web address]  01/23/2022 2:53 PM

## 2022-01-23 LAB — FERRITIN: Ferritin: 199 ng/mL (ref 11–307)

## 2022-01-29 ENCOUNTER — Telehealth: Payer: Self-pay | Admitting: *Deleted

## 2022-01-29 NOTE — Telephone Encounter (Signed)
TCT patient regarding recent lab results. Spoke with her and advised that her iron studies were normal. Per Dr. Lorenso Courier, her anemia is likely due to bone marrow suppression from inflammation. Advised we will see her back in 3 months. Pt voiced understanding.

## 2022-01-29 NOTE — Telephone Encounter (Signed)
-----   Message from Orson Slick, MD sent at 01/29/2022  9:44 AM EDT ----- Please let Ms. Topete know that her iron levels are fine.  At this time her anemia appears most consistent with bone marrow suppression due to inflammation.  We will plan to see her back in 3 months time in order to reevaluate. ----- Message ----- From: Interface, Lab In [place] Sent: 01/22/2022   3:26 PM EDT To: Orson Slick, MD

## 2022-04-10 ENCOUNTER — Other Ambulatory Visit: Payer: Self-pay | Admitting: Cardiology

## 2022-04-10 ENCOUNTER — Other Ambulatory Visit: Payer: Self-pay

## 2022-04-10 ENCOUNTER — Encounter (HOSPITAL_COMMUNITY): Payer: Self-pay | Admitting: Emergency Medicine

## 2022-04-10 ENCOUNTER — Emergency Department (HOSPITAL_COMMUNITY): Payer: Medicare Other

## 2022-04-10 ENCOUNTER — Emergency Department (HOSPITAL_COMMUNITY)
Admission: EM | Admit: 2022-04-10 | Discharge: 2022-04-10 | Disposition: A | Discharge status: Home / Self Care | Payer: Medicare Other | Attending: Emergency Medicine | Admitting: Emergency Medicine

## 2022-04-10 DIAGNOSIS — I509 Heart failure, unspecified: Secondary | ICD-10-CM | POA: Diagnosis not present

## 2022-04-10 DIAGNOSIS — I484 Atypical atrial flutter: Secondary | ICD-10-CM | POA: Insufficient documentation

## 2022-04-10 DIAGNOSIS — Z79899 Other long term (current) drug therapy: Secondary | ICD-10-CM | POA: Diagnosis not present

## 2022-04-10 DIAGNOSIS — I110 Hypertensive heart disease with heart failure: Secondary | ICD-10-CM | POA: Diagnosis not present

## 2022-04-10 DIAGNOSIS — R531 Weakness: Secondary | ICD-10-CM | POA: Insufficient documentation

## 2022-04-10 DIAGNOSIS — I4891 Unspecified atrial fibrillation: Secondary | ICD-10-CM | POA: Insufficient documentation

## 2022-04-10 DIAGNOSIS — Z7901 Long term (current) use of anticoagulants: Secondary | ICD-10-CM | POA: Insufficient documentation

## 2022-04-10 DIAGNOSIS — I480 Paroxysmal atrial fibrillation: Secondary | ICD-10-CM

## 2022-04-10 DIAGNOSIS — R0602 Shortness of breath: Secondary | ICD-10-CM | POA: Diagnosis present

## 2022-04-10 LAB — BASIC METABOLIC PANEL
Anion gap: 9 (ref 5–15)
BUN: 8 mg/dL (ref 8–23)
CO2: 26 mmol/L (ref 22–32)
Calcium: 10.6 mg/dL — ABNORMAL HIGH (ref 8.9–10.3)
Chloride: 107 mmol/L (ref 98–111)
Creatinine, Ser: 0.66 mg/dL (ref 0.44–1.00)
GFR, Estimated: >60 mL/min (ref >60)
Glucose, Bld: 110 mg/dL — ABNORMAL HIGH (ref 70–99)
Potassium: 3.8 mmol/L (ref 3.5–5.1)
Sodium: 142 mmol/L (ref 135–145)

## 2022-04-10 LAB — MAGNESIUM: Magnesium: 1.8 mg/dL (ref 1.7–2.4)

## 2022-04-10 LAB — CBC
HCT: 38.6 % (ref 36.0–46.0)
Hemoglobin: 11.7 g/dL — ABNORMAL LOW (ref 12.0–15.0)
MCH: 30.7 pg (ref 26.0–34.0)
MCHC: 30.3 g/dL (ref 30.0–36.0)
MCV: 101.3 fL — ABNORMAL HIGH (ref 80.0–100.0)
Platelets: 270 10*3/uL (ref 150–400)
RBC: 3.81 MIL/uL — ABNORMAL LOW (ref 3.87–5.11)
RDW: 13.9 % (ref 11.5–15.5)
WBC: 5.5 10*3/uL (ref 4.0–10.5)
nRBC: 0 % (ref 0.0–0.2)

## 2022-04-10 LAB — URINALYSIS, ROUTINE W REFLEX MICROSCOPIC
Bilirubin Urine: NEGATIVE
Glucose, UA: NEGATIVE mg/dL
Ketones, ur: NEGATIVE mg/dL
Nitrite: POSITIVE — AB
Protein, ur: 30 mg/dL — AB
Specific Gravity, Urine: 1.015 (ref 1.005–1.030)
WBC, UA: >50 WBC/hpf — ABNORMAL HIGH (ref 0–5)
pH: 7 (ref 5.0–8.0)

## 2022-04-10 LAB — BRAIN NATRIURETIC PEPTIDE: B Natriuretic Peptide: 573.7 pg/mL — ABNORMAL HIGH (ref 0.0–100.0)

## 2022-04-10 LAB — TSH: TSH: 3.199 u[IU]/mL (ref 0.350–4.500)

## 2022-04-10 MED ORDER — CEPHALEXIN 500 MG PO CAPS: 500.0000 mg | ORAL_CAPSULE | Freq: Four times a day (QID) | ORAL | 0 refills | Status: DC

## 2022-04-10 MED ORDER — SODIUM CHLORIDE 0.9 % IV SOLN
1.0000 g | Freq: Once | INTRAVENOUS | Status: AC
  Administered 2022-04-10: 1 g via INTRAVENOUS
  Filled 2022-04-10: qty 10

## 2022-04-10 MED ORDER — ONDANSETRON HCL 4 MG/2ML IJ SOLN
4.0000 mg | Freq: Once | INTRAMUSCULAR | Status: AC
  Administered 2022-04-10: 4 mg via INTRAVENOUS
  Filled 2022-04-10: qty 2

## 2022-04-10 MED ORDER — ETOMIDATE 2 MG/ML IV SOLN
INTRAVENOUS | Status: AC | PRN
  Administered 2022-04-10: 4 mg via INTRAVENOUS

## 2022-04-10 MED ORDER — DILTIAZEM LOAD VIA INFUSION
5.0000 mg | Freq: Once | INTRAVENOUS | Status: AC
  Administered 2022-04-10: 5 mg via INTRAVENOUS
  Filled 2022-04-10: qty 5

## 2022-04-10 MED ORDER — DILTIAZEM HCL-DEXTROSE 125-5 MG/125ML-% IV SOLN (PREMIX)
5.0000 mg/h | INTRAVENOUS | Status: DC
  Administered 2022-04-10: 5 mg/h via INTRAVENOUS
  Filled 2022-04-10: qty 125

## 2022-04-10 MED ORDER — ETOMIDATE 2 MG/ML IV SOLN: 0.1000 mg/kg | Freq: Once | INTRAVENOUS | Status: DC

## 2022-04-10 NOTE — CV Procedure (Signed)
Direct current cardioversion:  Indication symptomatic: Symptomatic atrial flutter  Procedure: Under deep sedation administered and monitored by anesthesiology, synchronized direct current cardioversion performed. Patient was delivered with 50 Joules of electricity X 1 with success to NSR. Patient tolerated the procedure well. No immediate complication noted.   Continue metoprolol succinate 200 mg daily, eliquis 5 mg bid. Will arrange outpatient cardiac monitor, echocardiogram and office follow up.   Nigel Mormon, MD Pager: [phone] Office: [phone]

## 2022-04-10 NOTE — ED Provider Notes (Addendum)
[facility] EMERGENCY DEPARTMENT Provider Note   CSN: [id number] Arrival date & time: 04/10/22  1033     History  Chief Complaint  Patient presents with   Atrial Fibrillation    Jeanette [name] is a 87 y.o. female.   Atrial Fibrillation Associated symptoms include shortness of breath.  Patient presents for shortness of breath and generalized weakness.  Her medical history includes HLD, anemia, HTN, CVA, paroxysmal atrial fibrillation, CHF, depression, anxiety.  She states that she was in her normal state of health last night.  When she woke up this morning, she felt short of breath.  She was able to go back to sleep but woke up again feeling short of breath.  When she got up to ambulate, she felt generalized weakness and dizziness.  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  For this reason, EMS was called.  EMS noted irregularly irregular rhythm with tachycardia.  She is prescribed Toprol all and did take her dose this morning.  EMS provided gentle IV fluids with no other interventions prior to arrival.  Patient denies any associated chest pain this morning.  Currently, she endorses mild fatigue.  She does not feel palpitations.  She is prescribed Eliquis and states that she has been adherent to this medication.  Her last dose was last night.     Home Medications Prior to Admission medications   Medication Sig Start Date End Date Taking? Authorizing Provider  acetaminophen (TYLENOL) 650 MG CR tablet Take 650 mg by mouth 2 (two) times daily.   Yes [provider]  apixaban (ELIQUIS) 5 MG TABS tablet Take 1 tablet (5 mg total) by mouth 2 (two) times daily. 12/06/19  Yes Rinehuls, Early Chars, PA-C  cephALEXin (KEFLEX) 500 MG capsule Take 1 capsule (500 mg total) by mouth 4 (four) times daily. 04/10/22  Yes Godfrey Pick, MD  diclofenac Sodium (VOLTAREN) 1 % GEL Apply 2 g topically 4 (four) times daily.   Yes [provider]   escitalopram (LEXAPRO) 10 MG tablet Take 10 mg by mouth daily.   Yes [provider]  gabapentin (NEURONTIN) 100 MG capsule Take 100 mg by mouth daily as needed (pain).   Yes [provider]  metoprolol (TOPROL-XL) 200 MG 24 hr tablet Take 100 mg by mouth 2 (two) times daily.   Yes [provider]  nitroGLYCERIN (NITROSTAT) 0.4 MG SL tablet PLACE 1 TABLET (0.4 MG TOTAL) UNDER THE TONGUE EVERY 5 (FIVE) MINUTES AS NEEDED FOR CHEST PAIN. 07/13/19 04/10/22 Yes Patwardhan, Manish J, MD  olmesartan (BENICAR) 20 MG tablet Take 10 mg by mouth daily.   Yes [provider]  vitamin B-12 (CYANOCOBALAMIN) 100 MCG tablet Take 50 mcg by mouth daily.   Yes [provider]  cholecalciferol (VITAMIN D) 1000 units tablet Take 1,000 Units by mouth daily. Patient not taking: Reported on 04/10/2022    [provider]  ferrous sulfate 325 (65 FE) MG tablet Take 1 tablet (325 mg total) by mouth daily with breakfast. Please take with a source of Vitamin C Patient not taking: Reported on 04/10/2022 10/13/21   Orson Slick, MD  HYDROcodone-acetaminophen (NORCO/VICODIN) 5-325 MG tablet Take 1 tablet by mouth every 6 (six) hours as needed for moderate pain. Taking 1/2 as needed Patient not taking: Reported on 04/10/2022    [provider]  REPATHA SURECLICK 301 MG/ML SOAJ Inject 140 mg into the skin See admin instructions. Take  $'140mg'e$  SQ every two weeks per patient Patient not taking: Reported on 04/10/2022 10/12/19   [provider]      Allergies    Epinephrine, Cozaar [losartan], Crestor [rosuvastatin], Septra [sulfamethoxazole-trimethoprim], Vytorin [ezetimibe-simvastatin], Benazepril hcl, Crestor [rosuvastatin calcium], and Lisinopril    Review of Systems   Review of Systems  Constitutional:  Positive for fatigue.  Respiratory:  Positive for shortness of breath.   Neurological:  Positive for dizziness and weakness (Generalized).  All other systems  reviewed and are negative.   Physical Exam Updated Vital Signs BP 127/69   Pulse 76   Temp 97.8 F (36.6 C) (Oral)   Resp 20   Ht '5\' 3"'$  (1.6 m)   Wt 60.4 kg   SpO2 100%   BMI 23.59 kg/m  Physical Exam Vitals and nursing note reviewed.  Constitutional:      General: She is not in acute distress.    Appearance: Normal appearance. She is well-developed and normal weight. She is not ill-appearing, toxic-appearing or diaphoretic.  HENT:     Head: Normocephalic and atraumatic.     Right Ear: External ear normal.     Left Ear: External ear normal.     Nose: Nose normal.     Mouth/Throat:     Mouth: Mucous membranes are moist.     Pharynx: Oropharynx is clear.  Eyes:     Conjunctiva/sclera: Conjunctivae normal.  Cardiovascular:     Rate and Rhythm: Tachycardia present. Rhythm irregular.     Heart sounds: No murmur heard. Pulmonary:     Effort: Pulmonary effort is normal. No respiratory distress.     Breath sounds: Normal breath sounds. No wheezing or rales.  Chest:     Chest wall: No tenderness.  Abdominal:     General: There is no distension.     Palpations: Abdomen is soft.     Tenderness: There is no abdominal tenderness.  Musculoskeletal:        General: No swelling. Normal range of motion.     Cervical back: Neck supple.     Right lower leg: No edema.     Left lower leg: No edema.  Skin:    General: Skin is warm and dry.     Capillary Refill: Capillary refill takes less than 2 seconds.     Coloration: Skin is not jaundiced or pale.  Neurological:     General: No focal deficit present.     Mental Status: She is alert and oriented to person, place, and time.     Cranial Nerves: No cranial nerve deficit.     Sensory: No sensory deficit.     Motor: No weakness.     Coordination: Coordination normal.  Psychiatric:        Mood and Affect: Mood normal.        Behavior: Behavior normal.        Thought Content: Thought content normal.        Judgment: Judgment  normal.     ED Results / Procedures / Treatments   Labs (all labs ordered are listed, but only abnormal results are displayed) Labs Reviewed  BASIC METABOLIC PANEL - Abnormal; Notable for the following components:      Result Value   Glucose, Bld 110 (*)    Calcium 10.6 (*)    All other components within normal limits  CBC - Abnormal; Notable for the following components:   RBC 3.81 (*)    Hemoglobin 11.7 (*)    MCV  101.3 (*)    All other components within normal limits  BRAIN NATRIURETIC PEPTIDE - Abnormal; Notable for the following components:   B Natriuretic Peptide 573.7 (*)    All other components within normal limits  URINALYSIS, ROUTINE W REFLEX MICROSCOPIC - Abnormal; Notable for the following components:   Color, Urine AMBER (*)    APPearance CLOUDY (*)    Hgb urine dipstick MODERATE (*)    Protein, ur 30 (*)    Nitrite POSITIVE (*)    Leukocytes,Ua SMALL (*)    WBC, UA >50 (*)    Bacteria, UA RARE (*)    All other components within normal limits  URINE CULTURE  MAGNESIUM  TSH    EKG EKG Interpretation  Date/Time:  Tuesday April 10 2022 12:42:00 EDT Ventricular Rate:  66 PR Interval:  148 QRS Duration: 74 QT Interval:  370 QTC Calculation: 388 R Axis:   42 Text Interpretation: Sinus arrhythmia Low voltage, precordial leads Abnormal R-wave progression, early transition Confirmed by Godfrey Pick [phone]) on 04/10/2022 1:08:36 PM  Radiology DG Chest Port 1 View  Result Date: 04/10/2022 CLINICAL DATA:  shortness of breath EXAM: PORTABLE CHEST 1 VIEW COMPARISON:  March third 2020 FINDINGS: The cardiomediastinal silhouette is unchanged in contour.Atherosclerotic calcifications of the tortuous thoracic aorta. No pleural effusion. No pneumothorax. LEFT lateral linear opacities most consistent with atelectasis/scar. No acute pleuroparenchymal abnormality. Visualized abdomen is unremarkable. IMPRESSION: No acute cardiopulmonary abnormality. Electronically Signed   By:  Valentino Saxon M.D.   On: 04/10/2022 11:23    Procedures .Sedation  Date/Time: 04/10/2022 12:45 PM  Performed by: Godfrey Pick, MD Authorized by: Godfrey Pick, MD   Consent:    Consent obtained:  Verbal and written   Consent given by:  Patient   Risks discussed:  Prolonged hypoxia resulting in organ damage, inadequate sedation, respiratory compromise necessitating ventilatory assistance and intubation, nausea, vomiting and allergic reaction Universal protocol:    Procedure explained and questions answered to patient or proxy's satisfaction: yes     Immediately prior to procedure, a time out was called: yes   Indications:    Procedure performed:  Cardioversion   Procedure necessitating sedation performed by:  Different physician Pre-sedation assessment:    Time since last food or drink:  14 hours   ASA classification: class 3 - patient with severe systemic disease     Mouth opening:  2 finger widths   Thyromental distance:  3 finger widths   Mallampati score:  II - soft palate, uvula, fauces visible   Pre-sedation assessments completed and reviewed: airway patency, cardiovascular function, hydration status, mental status, nausea/vomiting, pain level and respiratory function     Pre-sedation assessment completed:  04/10/2022 12:30 PM Immediate pre-procedure details:    Reassessment: Patient reassessed immediately prior to procedure     Verified: bag valve mask available, emergency equipment available, intubation equipment available, IV patency confirmed, oxygen available and suction available   Procedure details (see MAR for exact dosages):    Preoxygenation:  Room air   Sedation:  Etomidate   Intended level of sedation: moderate (conscious sedation)   Analgesia:  None   Intra-procedure monitoring:  Blood pressure monitoring, cardiac monitor, continuous capnometry, continuous pulse oximetry, frequent LOC assessments and frequent vital sign checks   Intra-procedure events: none      Total Provider sedation time (minutes):  12 Post-procedure details:    Post-sedation assessment completed:  04/10/2022 12:47 PM   Attendance: Constant attendance by certified staff until patient recovered  Recovery: Patient returned to pre-procedure baseline     Post-sedation assessments completed and reviewed: airway patency, cardiovascular function, hydration status, mental status, nausea/vomiting, pain level and respiratory function     Patient is stable for discharge or admission: yes     Procedure completion:  Tolerated well, no immediate complications     Medications Ordered in ED Medications  diltiazem (CARDIZEM) 1 mg/mL load via infusion 5 mg (5 mg Intravenous Bolus from Bag 04/10/22 1106)    And  diltiazem (CARDIZEM) 125 mg in dextrose 5% 125 mL (1 mg/mL) infusion (7.5 mg/hr Intravenous Rate/Dose Change 04/10/22 1148)  etomidate (AMIDATE) injection 6.04 mg (has no administration in time range)  cefTRIAXone (ROCEPHIN) 1 g in sodium chloride 0.9 % 100 mL IVPB (1 g Intravenous New Bag/Given 04/10/22 1430)  ondansetron (ZOFRAN) injection 4 mg (4 mg Intravenous Given 04/10/22 1235)  etomidate (AMIDATE) injection (4 mg Intravenous Given 04/10/22 1244)    ED Course/ Medical Decision Making/ A&P                           Medical Decision Making Amount and/or Complexity of Data Reviewed Labs: ordered. Radiology: ordered.  Risk Prescription drug management.   This patient presents to the ED for concern of shortness of breath and generalized weakness, this involves an extensive number of treatment options, and is a complaint that carries with it a high risk of complications and morbidity.  The differential diagnosis includes arrhythmia, CHF, infection, dehydration, metabolic disturbances   Co morbidities that complicate the patient evaluation  HLD, anemia, HTN, CVA, paroxysmal atrial fibrillation, CHF, depression, anxiety   Additional history obtained:  Additional history  obtained from N/A External records from outside source obtained and reviewed including EMR   Lab Tests:  I Ordered, and personally interpreted labs.  The pertinent results include: Baseline anemia, no leukocytosis, and hypercalcemia with otherwise normal electrolytes, BNP is elevated at 573 without prior labs for comparison.  Urinalysis is consistent with UTI.   Imaging Studies ordered:  I ordered imaging studies including chest x-ray I independently visualized and interpreted imaging which showed no acute findings I agree with the radiologist interpretation   Cardiac Monitoring: / EKG:  The patient was maintained on a cardiac monitor.  I personally viewed and interpreted the cardiac monitored which showed an underlying rhythm of: Atrial fibrillation/flutter with RVR   Consultations Obtained:  I requested consultation with the cardiologist, Dr. Virgina Jock,  and discussed lab and imaging findings as well as pertinent plan - they recommend: Cardioversion in the ED with discharge and outpatient follow-up   Problem List / ED Course / Critical interventions / Medication management  Patient is a pleasant 87 year old female who presents for symptoms this morning of shortness of breath and generalized weakness.  EMS was called to her home.  EMS identified tachycardia with rhythm strip consistent with atrial fibrillation with RVR.  She was given a small bolus of IV fluid prior to arrival.  She is prescribed metoprolol and to take her dose this morning.  On arrival, patient is alert and oriented.  She remains tachycardic in the range of 142-145.  She is normotensive.  She currently denies any symptoms while at rest.  She reports that she has been adherent to her Eliquis and has not missed any dosing other than this morning.  Patient was offered early cardioversion but declined.  Trial of rate control medication was started.  She was given a bolus and gtt.  of diltiazem.  Laboratory work-up was  initiated.  Following initiation of diltiazem, patient had no change in her heart rate.  She did have decrease in her blood pressure.  Heart rate appears regular and is locked in the range of 142-145.  This is suggestive of atrial flutter.  I discussed this with her cardiologist, who is on-call, Dr. Virgina Jock, who came to evaluate the patient in the ED.  He recommends cardioversion.  Patient was consented for this.  She was sedated, as per procedure note above.  She tolerated the sedation and procedure well.  She converted to a sinus rhythm and had sustained normal sinus rhythm during the remainder of her evaluation in the ED.  Lab work shows baseline hypercalcemia with otherwise normal electrolytes.  She does not have any new anemia or leukocytosis.  Her BNP is elevated but baseline is unknown.  Her breathing remains unlabored.  Urinalysis was consistent with UTI and she does endorse some recent dysuria.  She was given dose of ceftriaxone and prescribed Keflex for home.  Per Dr. Bonney Roussel recommendation, patient to follow-up in office.  She was discharged in good condition. I ordered medication including diltiazem for rate control; Zofran for nausea prophylaxis; etomidate for procedural sedation; ceftriaxone for UTI Reevaluation of the patient after these medicines showed that the patient improved I have reviewed the patients home medicines and have made adjustments as needed   Social Determinants of Health:  Lives independently, has access to outpatient care  CRITICAL CARE Performed by: Godfrey Pick   Total critical care time: 32 minutes  Critical care time was exclusive of separately billable procedures and treating other patients.  Critical care was necessary to treat or prevent imminent or life-threatening deterioration.  Critical care was time spent personally by me on the following activities: development of treatment plan with patient and/or surrogate as well as nursing, discussions  with consultants, evaluation of patient's response to treatment, examination of patient, obtaining history from patient or surrogate, ordering and performing treatments and interventions, ordering and review of laboratory studies, ordering and review of radiographic studies, pulse oximetry and re-evaluation of patient's condition.         Final Clinical Impression(s) / ED Diagnoses Final diagnoses:  Atrial fibrillation with RVR ([place])    Rx / DC Orders ED Discharge Orders          Ordered    cephALEXin (KEFLEX) 500 MG capsule  4 times daily        04/10/22 1444              Godfrey Pick, MD 04/10/22 1454    Godfrey Pick, MD 04/10/22 1614

## 2022-04-10 NOTE — Sedation Documentation (Signed)
Pt shocked at 50 J, pt now NSR with PVCs

## 2022-04-10 NOTE — Consult Note (Signed)
CARDIOLOGY CONSULT NOTE  Patient ID: Jeanette [name] MRN: [id number] DOB/AGE: 85-May-1936 85 y.o.  Admit date: 04/10/2022 Referring Physician: [facility] Reason for Consultation:  Afib/flutter  HPI:     87 y.o. Caucasian female with hypertension, paroxysmal atrial fibrillation, h/o stroke  I last saw the patient in 2021. Recently, she has been compliant on Eliquis 5 mg bid and doing well without any symptoms. This morning, patient started having sudden shortness of breath. In ER, she was found to be in Afib.flutter in 140s, not responding to IV diltiazem. On a separate note, she has had some dysuria, concerning for UTI, but no fever.   Past Medical History:  Diagnosis Date   A-fib ([place]) 03/15/2018   Anal cancer ([place])    Anxiety    Arthritis    Breast cancer ([place])    Breast cancer ([place])    Cancer of breast ([place])    Depression    Dysrhythmia 2010   a-fib   Hyperlipidemia    Hypertension    PVD (peripheral vascular disease) ([place])    RAS (renal artery stenosis) ([place])    Stroke ([place]) 2019   no residual, x 2     Past Surgical History:  Procedure Laterality Date   BREAST BIOPSY Left 01/14/2003   BREAST LUMPECTOMY Right 1989   CARDIOVASCULAR STRESS TEST  07/27/2003   No evidence if LV myocardial ischemia or scar. LV EF 74%.   GYNECOLOGIC CRYOSURGERY     IR CT HEAD LTD  12/04/2019   IR PERCUTANEOUS ART THROMBECTOMY/INFUSION INTRACRANIAL INC DIAG ANGIO  12/04/2019   IR US GUIDE VASC ACCESS LEFT  12/04/2019   OPEN REDUCTION INTERNAL FIXATION (ORIF) DISTAL RADIAL FRACTURE Left 02/19/2017   Procedure: OPEN REDUCTION INTERNAL FIXATION (ORIF) LEFT DISTAL RADIAL FRACTURE;  Surgeon: Leanora Cover, MD;  Location: [facility];  Service: Orthopedics;  Laterality: Left;   OVARIAN CYST REMOVAL     RADIOLOGY WITH ANESTHESIA N/A 12/04/2019   Procedure: IR WITH ANESTHESIA;  Surgeon: Luanne Bras, MD;  Location: [facility];  Service: Radiology;  Laterality: N/A;   RENAL DOPPLER   09/09/2012   Right proximal renal artery: 60-99% diameter reduction. Normal patency of the left main renal artery.    TRANSTHORACIC ECHOCARDIOGRAM  03/22/2006   EF 55-60%, mild mitral valvular regurg, pulmonary artery systolic pressure was moderately increased.   TUBAL LIGATION        Family History  Problem Relation Age of Onset   Alzheimer's disease Mother    Pneumonia Mother    Heart failure Father    Heart disease Sister    Heart disease Brother    Heart disease Brother    Heart disease Sister    Pneumonia Sister      Social History: Social History   Socioeconomic History   Marital status: Married    Spouse name: Richard   Number of children: 4   Years of education: Not on file   Highest education level: Not on file  Occupational History   Occupation: retired Proofreader    Comment: rehab RN  Tobacco Use   Smoking status: Never   Smokeless tobacco: Never  Vaping Use   Vaping Use: Never used  Substance and Sexual Activity   Alcohol use: Yes    Alcohol/week: 2.0 standard drinks of alcohol    Types: 2 Glasses of wine per week    Comment: rare   Drug use: No   Sexual activity: Yes    Partners: Male  Birth control/protection: Post-menopausal  Other Topics Concern   Not on file  Social History Narrative   Lives with spouse   Social Determinants of Health   Financial Resource Strain: Not on file  Food Insecurity: Not on file  Transportation Needs: Not on file  Physical Activity: Not on file  Stress: Not on file  Social Connections: Not on file  Intimate Partner Violence: Not on file     (Not in a hospital admission)   Review of Systems  Cardiovascular:  Positive for dyspnea on exertion. Negative for chest pain, leg swelling, palpitations and syncope.  Respiratory:  Positive for shortness of breath.   Genitourinary:  Positive for dysuria.      Physical Exam: Physical Exam Vitals and nursing note reviewed.  Constitutional:      General: She is not in  acute distress. Neck:     Vascular: No JVD.  Cardiovascular:     Rate and Rhythm: Regular rhythm. Tachycardia present.     Heart sounds: Normal heart sounds. No murmur heard. Pulmonary:     Effort: Pulmonary effort is normal.     Breath sounds: Normal breath sounds. No wheezing or rales.        Imaging/tests reviewed and independently interpreted: Lab Results: CBC, BMP  Cardiac Studies:  Telemetry 04/10/2022: Atrial flutter with RVR  EKG 04/10/2022: Atrial flutter w/RVR    MRI brain 09/08/2019: MRI brain with and without contrast demonstrating: -Chronic left parietal ischemic infarction; smaller chronic left occipital ischemic infarction. -No new or acute findings.   Lexiscan Myoview stress test 04/20/2019: Lexiscan stress test was performed. Stress EKG is non-diagnostic, as this is pharmacological stress test. In addition, rest and stress EKG showed sinus rhythm/tachycardia, occasional PAC's, no ST-T abnormality.  SPECT stress and rest images demonstrate a small size, mild intensity perfusion defect in basal inferoseptal myocardium with minimal reversibility. Normal wall thickening. Stress LVEF 54%. Low risk study.    Event monitor 03/06/2019 - 04/04/2019: Dominant rhythm sinus. HR 52-111 bpm. Two auto triggered events-one showing 5 beat NSVT Several patient triggered episodes showing PAC/PVC's correlate with patients symptoms of chest pain, shortness of breath, skipped beats, fatigue.   Echocardiogram 03/25/2019: Left ventricle cavity is normal in size. Normal left ventricular wall thickness. Normal LV systolic function with EF 56%. Normal global wall motion. Doppler evidence of grade I (impaired) diastolic dysfunction, normal LAP. Calculated EF 56%. Trileaflet aortic valve. Trace aortic regurgitation. Mild tricuspid regurgitation. Estimated pulmonary artery systolic pressure is 30 mmHg.     Assessment & Recommendations:   87 y.o. Caucasian female with hypertension,  paroxysmal atrial fibrillation, h/o stroke  Atrial flutter with RVR: Symptomatic with dyspnea, poorly rate controlled.  CHA2DS2VASc score 6, annual stroke risk 8% On Eliquis 5 mg b id Recommend cardioversion for rate and rhythm control. Post cardioversion, if stable, can be discharged. Will arrange outpatient monitor and follow up.  Unless severely bradycardic, may continue home met xL 200 mg on discharge.   Discussed interpretation of tests and management recommendations with the primary team     Nigel Mormon, MD Pager: [phone] Office: [phone]

## 2022-04-10 NOTE — Discharge Instructions (Addendum)
You have a urinary tract infection.  You received antibiotics in the emergency department that will provide coverage until tomorrow.  A prescription for continued antibiotics was sent to your pharmacy.  Take this as prescribed, starting tomorrow.  Follow-up with your primary care doctor for results of urine culture and any changes to your antibiotic that might be needed.  Continue your home metoprolol and Eliquis.  Follow-up with your cardiologist in the office.  His telephone number is below.  Return to the emergency department for any new or worsening symptoms of concern.

## 2022-04-10 NOTE — Progress Notes (Signed)
RT present for conscious sedation. No respiratory intervention needed.

## 2022-04-10 NOTE — ED Triage Notes (Signed)
Pt reports waking up at 630 with SOB and feeling dizzy. EMS reports afib RVR, 500 cc NS given which helped symptoms. Hx of Afib, pt took metoprolol this morning.

## 2022-04-12 LAB — URINE CULTURE

## 2022-04-16 ENCOUNTER — Ambulatory Visit: Payer: Medicare Other

## 2022-04-16 DIAGNOSIS — I480 Paroxysmal atrial fibrillation: Secondary | ICD-10-CM

## 2022-04-16 DIAGNOSIS — I484 Atypical atrial flutter: Secondary | ICD-10-CM

## 2022-04-21 ENCOUNTER — Emergency Department (HOSPITAL_BASED_OUTPATIENT_CLINIC_OR_DEPARTMENT_OTHER)
Admission: EM | Admit: 2022-04-21 | Discharge: 2022-04-21 | Disposition: A | Discharge status: Home / Self Care | Payer: Medicare Other | Attending: Emergency Medicine | Admitting: Emergency Medicine

## 2022-04-21 ENCOUNTER — Telehealth: Payer: Self-pay | Admitting: Cardiology

## 2022-04-21 ENCOUNTER — Emergency Department (HOSPITAL_BASED_OUTPATIENT_CLINIC_OR_DEPARTMENT_OTHER): Payer: Medicare Other | Admitting: Radiology

## 2022-04-21 ENCOUNTER — Other Ambulatory Visit: Payer: Self-pay

## 2022-04-21 DIAGNOSIS — Z7901 Long term (current) use of anticoagulants: Secondary | ICD-10-CM | POA: Insufficient documentation

## 2022-04-21 DIAGNOSIS — Z853 Personal history of malignant neoplasm of breast: Secondary | ICD-10-CM | POA: Diagnosis not present

## 2022-04-21 DIAGNOSIS — Z79899 Other long term (current) drug therapy: Secondary | ICD-10-CM

## 2022-04-21 DIAGNOSIS — I4891 Unspecified atrial fibrillation: Secondary | ICD-10-CM | POA: Diagnosis not present

## 2022-04-21 DIAGNOSIS — I10 Essential (primary) hypertension: Secondary | ICD-10-CM | POA: Diagnosis not present

## 2022-04-21 DIAGNOSIS — I480 Paroxysmal atrial fibrillation: Secondary | ICD-10-CM

## 2022-04-21 DIAGNOSIS — R0602 Shortness of breath: Secondary | ICD-10-CM | POA: Diagnosis present

## 2022-04-21 DIAGNOSIS — Z85048 Personal history of other malignant neoplasm of rectum, rectosigmoid junction, and anus: Secondary | ICD-10-CM | POA: Insufficient documentation

## 2022-04-21 LAB — CBC
HCT: 35.1 % — ABNORMAL LOW (ref 36.0–46.0)
Hemoglobin: 11 g/dL — ABNORMAL LOW (ref 12.0–15.0)
MCH: 31.2 pg (ref 26.0–34.0)
MCHC: 31.3 g/dL (ref 30.0–36.0)
MCV: 99.4 fL (ref 80.0–100.0)
Platelets: 278 10*3/uL (ref 150–400)
RBC: 3.53 MIL/uL — ABNORMAL LOW (ref 3.87–5.11)
RDW: 14.2 % (ref 11.5–15.5)
WBC: 6.2 10*3/uL (ref 4.0–10.5)
nRBC: 0 % (ref 0.0–0.2)

## 2022-04-21 LAB — BASIC METABOLIC PANEL
Anion gap: 9 (ref 5–15)
BUN: 12 mg/dL (ref 8–23)
CO2: 28 mmol/L (ref 22–32)
Calcium: 10.9 mg/dL — ABNORMAL HIGH (ref 8.9–10.3)
Chloride: 103 mmol/L (ref 98–111)
Creatinine, Ser: 0.75 mg/dL (ref 0.44–1.00)
GFR, Estimated: >60 mL/min (ref >60)
Glucose, Bld: 110 mg/dL — ABNORMAL HIGH (ref 70–99)
Potassium: 3.8 mmol/L (ref 3.5–5.1)
Sodium: 140 mmol/L (ref 135–145)

## 2022-04-21 LAB — TROPONIN I (HIGH SENSITIVITY): Troponin I (High Sensitivity): 10 ng/L (ref <18)

## 2022-04-21 MED ORDER — MULTAQ 400 MG PO TABS: 400.0000 mg | ORAL_TABLET | Freq: Two times a day (BID) | ORAL | 0 refills | Status: DC

## 2022-04-21 MED ORDER — ETOMIDATE 2 MG/ML IV SOLN
10.0000 mg | Freq: Once | INTRAVENOUS | Status: AC
  Administered 2022-04-21: 5 mg via INTRAVENOUS
  Filled 2022-04-21: qty 10

## 2022-04-21 MED ORDER — SODIUM CHLORIDE 0.9 % IV SOLN: INTRAVENOUS | Status: DC

## 2022-04-21 MED ORDER — MAGNESIUM 200 MG PO TABS: 200.0000 mg | ORAL_TABLET | Freq: Every day | ORAL | 0 refills | Status: DC

## 2022-04-21 MED ORDER — ADENOSINE 6 MG/2ML IV SOLN
6.0000 mg | Freq: Once | INTRAVENOUS | Status: AC
  Administered 2022-04-21: 6 mg via INTRAVENOUS
  Filled 2022-04-21: qty 2

## 2022-04-21 NOTE — Telephone Encounter (Signed)
ON-CALL CARDIOLOGY 04/21/22  Patient's name: Jeanette [name].   MRN: [id number].    DOB: 10-22-1934 Primary care provider: Crist Infante, MD. Primary cardiologist: Dr. Jorge Ny regarding this patient's care today: ED provider called to discuss her care.   Went to ED for shortness of breath.   Noted to be in SVT as per initial EKG. Was given adenosine and underlying rhythm was Afib (per ED provider but no documented ECG). She was cardioverted w/ 200J x 1 to SR.   Since this her 2nd cardioversion since October 2023 called to discuss medication changes to prevent recurrence.   Impression:   ICD-10-CM   1. Paroxysmal atrial fibrillation (HCC)  I48.0     2. Long term (current) use of anticoagulants  Z79.01     3. Long term current use of antiarrhythmic drug  Z79.899       Recommendations: Reviewed her last Echo and MPI, allergies, and recent labs. Patient is not in HF per ED provider.  Recommended starting Multaq '400mg'$  po bid and Magnesium oxide '200mg'$  po qday. 30day supply.  Will need to arrange outpatient follow up sooner due to starting antiarrhythmic medication.  Continue Toprol XL and Eliquis. Still has her Zio patch as well.  Will reach out to Dr. Virgina Jock to help coordinate future care.   Telephone encounter total time: 15 minutes.   Rex Kras, [place], [facility]  Pager: [phone] Office: [phone]

## 2022-04-21 NOTE — Discharge Instructions (Signed)
Return for any new or worse symptoms.  Cardiology wants you to add on Multaq and magnesium supplement prescription sent to your pharmacy.  [facility] in [place] take them as directed.  You have an appointment with them on October 30 they will probably contact you and move that up possibly.  But if not definitely make that appointment.

## 2022-04-21 NOTE — ED Notes (Signed)
Time out

## 2022-04-21 NOTE — Progress Notes (Signed)
Patient had cardioversion without incident. The patient's oxygen saturation maintained at 100% on 2l [name] and the PCO2 maintained at 33.  Patient tolerated well with no distress. RT will continue to monitor.

## 2022-04-21 NOTE — Sedation Documentation (Signed)
Pt cardioverted with 200J. Successful rhythm change. Pt currently in NSR.

## 2022-04-21 NOTE — Sedation Documentation (Signed)
Unable to assess pain during procedure due to sedation  

## 2022-04-21 NOTE — ED Notes (Signed)
Push 6

## 2022-04-21 NOTE — Telephone Encounter (Signed)
I will see her on 10/16 2 PM  Thanks MJP

## 2022-04-21 NOTE — ED Triage Notes (Signed)
Patient arrives ambulatory to triage with complaints of shortness of breath and chest pain that started this morning. Patient rates her chest pain in her back a 3/10.  Currently wearing a heart monitor for A. Fib/RVR.

## 2022-04-21 NOTE — ED Provider Notes (Signed)
[facility] EMERGENCY DEPT Provider Note   CSN: [id number] Arrival date & time: 04/21/22  1403     History  Chief Complaint  Patient presents with   Chest Pain    Jeanette [name] is a 87 y.o. female.  Patient with a known history of atrial fibrillation flutter.  Patient seen most recently October 3 came in with rapid atrial tachycardia that was felt to be atrial flutter.  Patient was cardioverted in the emergency department.  Seen by her cardiologist at that time and and has a home monitor device applied.  Patient currently on Eliquis patient currently on Toprol XL.  No new changes in her medications.  Patient denies any chest pain does feel little short of breath as which she noticed and she noticed that this morning and it continued.  Patient tried to figure out what her heart rate was but not able to determine that.  Past medical history significant for breast cancer anal cancer hypertension peripheral vascular disease renal artery stenosis hyperlipidemia atrial fibs since 2019 stroke in 2019.  Patient never used tobacco products.      Home Medications Prior to Admission medications   Medication Sig Start Date End Date Taking? Authorizing Provider  acetaminophen (TYLENOL) 650 MG CR tablet Take 650 mg by mouth 2 (two) times daily.    [provider]  apixaban (ELIQUIS) 5 MG TABS tablet Take 1 tablet (5 mg total) by mouth 2 (two) times daily. 12/06/19   Rinehuls, Early Chars, PA-C  cephALEXin (KEFLEX) 500 MG capsule Take 1 capsule (500 mg total) by mouth 4 (four) times daily. 04/10/22   Godfrey Pick, MD  cholecalciferol (VITAMIN D) 1000 units tablet Take 1,000 Units by mouth daily. Patient not taking: Reported on 04/10/2022    [provider]  diclofenac Sodium (VOLTAREN) 1 % GEL Apply 2 g topically 4 (four) times daily.    [provider]  escitalopram (LEXAPRO) 10 MG tablet Take 10 mg by mouth daily.    [provider]  ferrous sulfate  325 (65 FE) MG tablet Take 1 tablet (325 mg total) by mouth daily with breakfast. Please take with a source of Vitamin C Patient not taking: Reported on 04/10/2022 10/13/21   Orson Slick, MD  gabapentin (NEURONTIN) 100 MG capsule Take 100 mg by mouth daily as needed (pain).    [provider]  HYDROcodone-acetaminophen (NORCO/VICODIN) 5-325 MG tablet Take 1 tablet by mouth every 6 (six) hours as needed for moderate pain. Taking 1/2 as needed Patient not taking: Reported on 04/10/2022    [provider]  metoprolol (TOPROL-XL) 200 MG 24 hr tablet Take 100 mg by mouth 2 (two) times daily.    [provider]  nitroGLYCERIN (NITROSTAT) 0.4 MG SL tablet PLACE 1 TABLET (0.4 MG TOTAL) UNDER THE TONGUE EVERY 5 (FIVE) MINUTES AS NEEDED FOR CHEST PAIN. 07/13/19 04/10/22  Patwardhan, Reynold Bowen, MD  olmesartan (BENICAR) 20 MG tablet Take 10 mg by mouth daily.    [provider]  REPATHA SURECLICK 476 MG/ML SOAJ Inject 140 mg into the skin See admin instructions. Take '140mg'$  SQ every two weeks per patient Patient not taking: Reported on 04/10/2022 10/12/19   [provider]  vitamin B-12 (CYANOCOBALAMIN) 100 MCG tablet Take 50 mcg by mouth daily.    [provider]      Allergies    Epinephrine, Cozaar [losartan], Crestor [rosuvastatin], Septra [sulfamethoxazole-trimethoprim], Vytorin [ezetimibe-simvastatin], Benazepril hcl, Crestor [rosuvastatin calcium], and Lisinopril  Review of Systems   Review of Systems  Constitutional:  Negative for chills and fever.  HENT:  Negative for ear pain and sore throat.   Eyes:  Negative for pain and visual disturbance.  Respiratory:  Positive for shortness of breath. Negative for cough.   Cardiovascular:  Positive for palpitations. Negative for chest pain.  Gastrointestinal:  Negative for abdominal pain and vomiting.  Genitourinary:  Negative for dysuria and hematuria.  Musculoskeletal:  Negative for arthralgias and back  pain.  Skin:  Negative for color change and rash.  Neurological:  Negative for seizures and syncope.  All other systems reviewed and are negative.   Physical Exam Updated Vital Signs BP 111/85   Pulse (!) 57   Temp 98.3 F (36.8 C)   Resp 17   Ht 1.6 m ('5\' 3"'$ )   Wt 60.4 kg   SpO2 100%   BMI 23.59 kg/m  Physical Exam Vitals and nursing note reviewed.  Constitutional:      General: She is not in acute distress.    Appearance: She is well-developed.  HENT:     Head: Normocephalic and atraumatic.  Eyes:     Conjunctiva/sclera: Conjunctivae normal.  Cardiovascular:     Rate and Rhythm: Regular rhythm. Tachycardia present.     Heart sounds: No murmur heard. Pulmonary:     Effort: Pulmonary effort is normal. No respiratory distress.     Breath sounds: Normal breath sounds. No decreased breath sounds, wheezing, rhonchi or rales.  Chest:     Chest wall: No tenderness.  Abdominal:     Palpations: Abdomen is soft.     Tenderness: There is no abdominal tenderness.  Musculoskeletal:        General: No swelling.     Cervical back: Neck supple.  Skin:    General: Skin is warm and dry.     Capillary Refill: Capillary refill takes less than 2 seconds.  Neurological:     Mental Status: She is alert and oriented to person, place, and time.  Psychiatric:        Mood and Affect: Mood normal.    ED Results / Procedures / Treatments   Labs (all labs ordered are listed, but only abnormal results are displayed) Labs Reviewed  BASIC METABOLIC PANEL - Abnormal; Notable for the following components:      Result Value   Glucose, Bld 110 (*)    Calcium 10.9 (*)    All other components within normal limits  CBC - Abnormal; Notable for the following components:   RBC 3.53 (*)    Hemoglobin 11.0 (*)    HCT 35.1 (*)    All other components within normal limits  TROPONIN I (HIGH SENSITIVITY)  TROPONIN I (HIGH SENSITIVITY)    EKG EKG Interpretation  Date/Time:  Saturday April 21 2022 14:25:17 EDT Ventricular Rate:  149 PR Interval:    QRS Duration: 54 QT Interval:  318 QTC Calculation: 500 R Axis:   67 Text Interpretation: Supraventricular tachycardia Septal infarct , age undetermined ST & T wave abnormality, consider inferior ischemia Abnormal ECG When compared with ECG of 10-Apr-2022 12:42, PREVIOUS ECG IS PRESENT Confirmed by Fredia Sorrow [phone]) on 04/21/2022 3:14:10 PM  Radiology DG Chest Port 1 View  Result Date: 04/21/2022 CLINICAL DATA:  Upper back pain and shortness of breath. EXAM: PORTABLE CHEST 1 VIEW COMPARISON:  04/10/2022 FINDINGS: The heart size and mediastinal contours are within normal limits. There is no evidence of pulmonary edema, consolidation, pneumothorax or  pleural fluid. The visualized skeletal structures are unremarkable. IMPRESSION: No active disease. Electronically Signed   By: Aletta Edouard M.D.   On: 04/21/2022 15:19    Procedures .Cardioversion  Date/Time: 04/21/2022 4:48 PM  Performed by: Fredia Sorrow, MD Authorized by: Fredia Sorrow, MD   Consent:    Consent obtained:  Written and verbal   Consent given by:  Patient   Risks discussed:  Induced arrhythmia, cutaneous burn, pain and death   Alternatives discussed:  Rate-control medication Pre-procedure details:    Cardioversion basis:  Elective   Rhythm:  Atrial fibrillation   Electrode placement:  Anterior-posterior Attempt one:    Cardioversion mode:  Synchronous   Waveform:  Biphasic   Shock (Joules):  200   Shock outcome:  Conversion to normal sinus rhythm Post-procedure details:    Patient status:  Awake   Patient tolerance of procedure:  Tolerated well, no immediate complications Comments:     Patient successfully cardioverted to sinus initially but seems to be consistent with a rate controlled atrial fibrillation now twelve-lead repeated.  Patient received etomidate conscious sedation.     Medications Ordered in ED Medications  0.9 %  sodium  chloride infusion ( Intravenous New Bag/Given 04/21/22 1559)  etomidate (AMIDATE) injection 10 mg (has no administration in time range)  adenosine (ADENOCARD) 6 MG/2ML injection 6 mg (6 mg Intravenous Given 04/21/22 1605)    ED Course/ Medical Decision Making/ A&P                           Medical Decision Making Amount and/or Complexity of Data Reviewed Labs: ordered.  Risk Prescription drug management.   CRITICAL CARE Performed by: Fredia Sorrow Total critical care time: 60 minutes Critical care time was exclusive of separately billable procedures and treating other patients. Critical care was necessary to treat or prevent imminent or life-threatening deterioration. Critical care was time spent personally by me on the following activities: development of treatment plan with patient and/or surrogate as well as nursing, discussions with consultants, evaluation of patient's response to treatment, examination of patient, obtaining history from patient or surrogate, ordering and performing treatments and interventions, ordering and review of laboratory studies, ordering and review of radiographic studies, pulse oximetry and re-evaluation of patient's condition.  Patient with known history of atrial fibrillation and atrial flutter.  Patient cardioverted October 3 no change in medications.  Followed by [facility] cardiology.  Patient awoke this morning feeling short of breath that usually means her heart is going fast but she was not able to confirm that.  Patient is on Eliquis and has been taking that.  She is also on Toprol XL 200 mg divided in half doses twice a day.  Denies any chest pain.  Patient did take her Keflex that she was prescribed for the urinary tract infection and she feels as if that is better.  Rhythm here a fairly narrow complex but fairly regular tachycardia that could be an SVT but most likely not based on her history.  Patient given adenosine 6 mg which blocked her.  And as  she restarted it is consistent with atrial fibrillation.  Heart rate now back to the 140s.  Patient tolerated all that just fine.  We will go ahead and cardiovert her we will give her procedural sedation with etomidate and synchronized cardiovert her with 200 J.  And will talk to cardiology about follow-up meds if she does convert and goes back to sinus rhythm.  If she does not then will need start diltiazem and get her admitted.  Patient's basic metabolic panel normal CBC no leukocytosis hemoglobin down a little bit 11 initial troponin was 10 chest x-ray no active disease.  Diversion successful.  Patient received 5 mg of etomidate under the conscious sedation protocol.  Patient was cardioverted with 200 J synchronized.  Initially went into sinus rhythm and then started back into atrial fibs but rate controlled rates currently in the 60s.  Will observe.  Patient staying in rate control.  Discussed with on-call cardiology for [facility], Dr. Rex Kras.  He is recommending magnesium 200 mg once a day and also recommending starting her on Mulpaq 400 mg twice daily.  Also follow-up with them as already scheduled for October 30 but they can see if they can move that up.    Final Clinical Impression(s) / ED Diagnoses Final diagnoses:  Atrial fibrillation with RVR [facility])    Rx / DC Orders ED Discharge Orders     None         Fredia Sorrow, MD 04/21/22 1827

## 2022-04-23 ENCOUNTER — Ambulatory Visit: Payer: Medicare Other | Admitting: Cardiology

## 2022-04-23 ENCOUNTER — Encounter: Payer: Self-pay | Admitting: Cardiology

## 2022-04-23 VITALS — BP 123/72 | HR 79 | Temp 98.0°F | Resp 16 | Ht 63.0 in | Wt 134.0 lb

## 2022-04-23 DIAGNOSIS — I484 Atypical atrial flutter: Secondary | ICD-10-CM

## 2022-04-23 DIAGNOSIS — I480 Paroxysmal atrial fibrillation: Secondary | ICD-10-CM

## 2022-04-23 DIAGNOSIS — Z8673 Personal history of transient ischemic attack (TIA), and cerebral infarction without residual deficits: Secondary | ICD-10-CM

## 2022-04-23 NOTE — Progress Notes (Signed)
Follow up visit  Subjective:   Jeanette [name], female    DOB: 1934-12-28, 87 y.o.   MRN: [id number]   HPI  Chief Complaint  Patient presents with   Atrial Fibrillation   Follow-up    87 y.o. Caucasian female with hypertension, paroxysmal atrial fibrillation, h/o stroke  Patient has had 2 ER visits last 2 weeks with complaints of rapid heartbeat.  She was found to be tachycardic around 150 with most likely atypical atrial flutter.  Both occasions, she underwent successful electrical cardioversion.  On 04/21/2022, she was started on Multaq 400 mg twice daily and magnesium 200 mg every day, but unfortunately it.  She is doing well today and denies any complaints.    Current Outpatient Medications:    acetaminophen (TYLENOL) 650 MG CR tablet, Take 650 mg by mouth 2 (two) times daily., Disp: , Rfl:    apixaban (ELIQUIS) 5 MG TABS tablet, Take 1 tablet (5 mg total) by mouth 2 (two) times daily., Disp: 60 tablet, Rfl: 1   cephALEXin (KEFLEX) 500 MG capsule, Take 1 capsule (500 mg total) by mouth 4 (four) times daily., Disp: 20 capsule, Rfl: 0   cholecalciferol (VITAMIN D) 1000 units tablet, Take 1,000 Units by mouth daily. (Patient not taking: Reported on 04/10/2022), Disp: , Rfl:    diclofenac Sodium (VOLTAREN) 1 % GEL, Apply 2 g topically 4 (four) times daily., Disp: , Rfl:    dronedarone (MULTAQ) 400 MG tablet, Take 1 tablet (400 mg total) by mouth 2 (two) times daily with a meal., Disp: 60 tablet, Rfl: 0   escitalopram (LEXAPRO) 10 MG tablet, Take 10 mg by mouth daily., Disp: , Rfl:    ferrous sulfate 325 (65 FE) MG tablet, Take 1 tablet (325 mg total) by mouth daily with breakfast. Please take with a source of Vitamin C (Patient not taking: Reported on 04/10/2022), Disp: 90 tablet, Rfl: 3   gabapentin (NEURONTIN) 100 MG capsule, Take 100 mg by mouth daily as needed (pain)., Disp: , Rfl:    HYDROcodone-acetaminophen (NORCO/VICODIN) 5-325 MG tablet, Take 1 tablet by mouth every 6 (six)  hours as needed for moderate pain. Taking 1/2 as needed (Patient not taking: Reported on 04/10/2022), Disp: , Rfl:    Magnesium 200 MG TABS, Take 1 tablet (200 mg total) by mouth daily., Disp: 30 tablet, Rfl: 0   metoprolol (TOPROL-XL) 200 MG 24 hr tablet, Take 100 mg by mouth 2 (two) times daily., Disp: , Rfl:    nitroGLYCERIN (NITROSTAT) 0.4 MG SL tablet, PLACE 1 TABLET (0.4 MG TOTAL) UNDER THE TONGUE EVERY 5 (FIVE) MINUTES AS NEEDED FOR CHEST PAIN., Disp: 75 tablet, Rfl: 1   olmesartan (BENICAR) 20 MG tablet, Take 10 mg by mouth daily., Disp: , Rfl:    REPATHA SURECLICK 301 MG/ML SOAJ, Inject 140 mg into the skin See admin instructions. Take 152m SQ every two weeks per patient (Patient not taking: Reported on 04/10/2022), Disp: , Rfl:    vitamin B-12 (CYANOCOBALAMIN) 100 MCG tablet, Take 50 mcg by mouth daily., Disp: , Rfl:    Cardiovascular & other pertient studies:  Reviewed external labs and tests, independently interpreted  EKG 04/23/2022: Sinus rhythm 74 bpm Nonspecific ST-T abnormality   Recent labs: 04/10/2022: Glucose 110, BUN/Cr 8/0.66. EGFR >60. Na/K 142/3.8. BNP 573 H/H 11.7/38.6. MCV 101. Platelets 270   Review of Systems  Cardiovascular:  Positive for palpitations (Currently resolved). Negative for chest pain, dyspnea on exertion, leg swelling and syncope.  Vitals:   04/23/22 1401  BP: 123/72  Pulse: 79  Resp: 16  Temp: 98 F (36.7 C)  SpO2: 92%    Body mass index is 23.74 kg/m. Filed Weights   04/23/22 1401  Weight: 134 lb (60.8 kg)     Objective:   Physical Exam Vitals and nursing note reviewed.  Constitutional:      General: She is not in acute distress. Neck:     Vascular: No JVD.  Cardiovascular:     Rate and Rhythm: Normal rate and regular rhythm.     Heart sounds: Normal heart sounds. No murmur heard. Pulmonary:     Effort: Pulmonary effort is normal.     Breath sounds: Normal breath sounds. No wheezing or rales.   Musculoskeletal:     Right lower leg: No edema.     Left lower leg: No edema.             Visit diagnoses:   ICD-10-CM   1. Paroxysmal atrial fibrillation (HCC)  I48.0 EKG 12-Lead    2. Atypical atrial flutter (HCC)  I48.4     3. H/O: stroke  Z86.73        Orders Placed This Encounter  Procedures   EKG 12-Lead       Assessment & Recommendations:   87 y.o. Caucasian female with hypertension, paroxysmal atrial fibrillation, h/o stroke  Paroxysmal atrial flutter: Historically patient has had proximated fibrillation.  However, recent episodes most likely atypical flutter.  Currently in sinus rhythm, but symptomatic with frequent episodes of atrial flutter.  She is now started on Multaq 400 mg twice daily.  Continue the same.  I also discussed with the ablation option.  She is unsure about ablation at this time.  Therefore, continue antiarrhythmic therapy. CHA2DS2VASc score 6, annual stroke risk 8% On Eliquis 5 mg bid  Patient is currently wearing a Zio patch.  I also encouraged her to use either Apple watch, Samsung Galazy 3, or Kardia mobile for self monitoring.  F/u in 4 weeks     Nigel Mormon, MD Pager: [phone] Office: [phone]

## 2022-04-26 ENCOUNTER — Inpatient Hospital Stay: Payer: Medicare Other | Attending: Hematology and Oncology

## 2022-04-26 ENCOUNTER — Other Ambulatory Visit: Payer: Self-pay | Admitting: Hematology and Oncology

## 2022-04-26 ENCOUNTER — Inpatient Hospital Stay (HOSPITAL_BASED_OUTPATIENT_CLINIC_OR_DEPARTMENT_OTHER): Payer: Medicare Other | Admitting: Hematology and Oncology

## 2022-04-26 ENCOUNTER — Other Ambulatory Visit: Payer: Self-pay

## 2022-04-26 VITALS — BP 131/83 | HR 72 | Temp 97.5°F | Resp 15 | Wt 131.5 lb

## 2022-04-26 DIAGNOSIS — I4891 Unspecified atrial fibrillation: Secondary | ICD-10-CM | POA: Diagnosis not present

## 2022-04-26 DIAGNOSIS — D509 Iron deficiency anemia, unspecified: Secondary | ICD-10-CM | POA: Insufficient documentation

## 2022-04-26 DIAGNOSIS — D500 Iron deficiency anemia secondary to blood loss (chronic): Secondary | ICD-10-CM | POA: Diagnosis not present

## 2022-04-26 DIAGNOSIS — Z7901 Long term (current) use of anticoagulants: Secondary | ICD-10-CM | POA: Insufficient documentation

## 2022-04-26 DIAGNOSIS — D638 Anemia in other chronic diseases classified elsewhere: Secondary | ICD-10-CM

## 2022-04-26 LAB — CMP (CANCER CENTER ONLY)
ALT: 10 U/L (ref 0–44)
AST: 9 U/L — ABNORMAL LOW (ref 15–41)
Albumin: 3.6 g/dL (ref 3.5–5.0)
Alkaline Phosphatase: 61 U/L (ref 38–126)
Anion gap: 4 — ABNORMAL LOW (ref 5–15)
BUN: 8 mg/dL (ref 8–23)
CO2: 30 mmol/L (ref 22–32)
Calcium: 10.8 mg/dL — ABNORMAL HIGH (ref 8.9–10.3)
Chloride: 106 mmol/L (ref 98–111)
Creatinine: 0.64 mg/dL (ref 0.44–1.00)
GFR, Estimated: >60 mL/min (ref >60)
Glucose, Bld: 85 mg/dL (ref 70–99)
Potassium: 4 mmol/L (ref 3.5–5.1)
Sodium: 140 mmol/L (ref 135–145)
Total Bilirubin: 0.5 mg/dL (ref 0.3–1.2)
Total Protein: 6.7 g/dL (ref 6.5–8.1)

## 2022-04-26 LAB — RETIC PANEL
Immature Retic Fract: 22.8 % — ABNORMAL HIGH (ref 2.3–15.9)
RBC.: 3.27 MIL/uL — ABNORMAL LOW (ref 3.87–5.11)
Retic Count, Absolute: 76.5 10*3/uL (ref 19.0–186.0)
Retic Ct Pct: 2.3 % (ref 0.4–3.1)
Reticulocyte Hemoglobin: 28.5 pg (ref >27.9)

## 2022-04-26 LAB — CBC WITH DIFFERENTIAL (CANCER CENTER ONLY)
Abs Immature Granulocytes: 0.05 10*3/uL (ref 0.00–0.07)
Basophils Absolute: 0 10*3/uL (ref 0.0–0.1)
Basophils Relative: 0 %
Eosinophils Absolute: 0.1 10*3/uL (ref 0.0–0.5)
Eosinophils Relative: 1 %
HCT: 32.3 % — ABNORMAL LOW (ref 36.0–46.0)
Hemoglobin: 10.3 g/dL — ABNORMAL LOW (ref 12.0–15.0)
Immature Granulocytes: 1 %
Lymphocytes Relative: 22 %
Lymphs Abs: 0.9 10*3/uL (ref 0.7–4.0)
MCH: 31.3 pg (ref 26.0–34.0)
MCHC: 31.9 g/dL (ref 30.0–36.0)
MCV: 98.2 fL (ref 80.0–100.0)
Monocytes Absolute: 0.8 10*3/uL (ref 0.1–1.0)
Monocytes Relative: 17 %
Neutro Abs: 2.5 10*3/uL (ref 1.7–7.7)
Neutrophils Relative %: 59 %
Platelet Count: 243 10*3/uL (ref 150–400)
RBC: 3.29 MIL/uL — ABNORMAL LOW (ref 3.87–5.11)
RDW: 14.2 % (ref 11.5–15.5)
WBC Count: 4.3 10*3/uL (ref 4.0–10.5)
nRBC: 0 % (ref 0.0–0.2)

## 2022-04-26 LAB — IRON AND IRON BINDING CAPACITY (CC-WL,HP ONLY)
Iron: 49 ug/dL (ref 28–170)
Saturation Ratios: 21 % (ref 10.4–31.8)
TIBC: 239 ug/dL — ABNORMAL LOW (ref 250–450)
UIBC: 190 ug/dL

## 2022-04-26 NOTE — Progress Notes (Signed)
[place] [phone]   [phone]  PROGRESS NOTE  Patient Care Team: Crist Infante, MD as PCP - General (Internal Medicine)  Hematological/Oncological History  # Iron Deficiency Anemia 2/2 Unclear Etiology  # Anemia of Chronic Disease/Inflammation  08/04/2021: WBC 7.05, Hgb 8.7, MCV 82.7, Plt 407. Ferritin 149, Iron sat 9%, TIBC 203, Iron 19.  Cr 0.7 08/11/2021: establish care with Dr. Lorenso Courier 08/18/2021-08/25/2021: Patient received IV Feraheme 510 mg x 2 doses 01/22/2022: White blood cell count 4.9, hemoglobin 9.5, MCV 100.7, platelets of 284.  Currently has iron sat of 14% with ferritin of 199  Interval History:  Jeanette [name] 87 y.o. female with medical history significant for iron deficiency anemia of unclear etiology who presents for a follow up visit. The patient's last visit was on 01/22/2022. In the interim since the last visit she has had no major changes in her health.   On exam today Ms. Reno reports she has been "exhausted".  She notes that she is undergone 2 cardioversions in the last 2 weeks.  She reports that [facility] and was quite impressed.  She notes it is becoming more crowded" to see what is out".  She reports that they were friendly and they helped her with her cardiac issues.  She notes she has not been having any overt signs of bleeding such as dark stools,, bleeding, or nosebleeds, she reports that she did just finished having urine tract infection and is still having some left flank pain.  She will be having a repeat urine study with her primary care provider.  She wears it because she wears a diaper that may be causing contamination of her urinary tract.  She notes her breathing is "okay".  She notes that she is a retired [employer] scientist and was upset where she could not make a good count of her pulse because of how erratic it was.  She notes that her energy levels have been quite low and she  currently reports they are 5 out of 10.  Otherwise she has had no major changes in her health.  Otherwise she currently denies any fevers, chills, sweats, nausea, ming or diarrhea.  Full 10 point ROS is listed below.  MEDICAL HISTORY:  Past Medical History:  Diagnosis Date   A-fib ([place]) 03/15/2018   Anal cancer ([place])    Anxiety    Arthritis    Breast cancer ([place])    Breast cancer ([place])    Cancer of breast ([place])    Depression    Dysrhythmia 2010   a-fib   Hyperlipidemia    Hypertension    PVD (peripheral vascular disease) ([place])    RAS (renal artery stenosis) ([place])    Stroke ([place]) 2019   no residual, x 2    SURGICAL HISTORY: Past Surgical History:  Procedure Laterality Date   BREAST BIOPSY Left 01/14/2003   BREAST LUMPECTOMY Right 1989   CARDIOVASCULAR STRESS TEST  07/27/2003   No evidence if LV myocardial ischemia or scar. LV EF 74%.   GYNECOLOGIC CRYOSURGERY     IR CT HEAD LTD  12/04/2019   IR PERCUTANEOUS ART THROMBECTOMY/INFUSION INTRACRANIAL INC DIAG ANGIO  12/04/2019   IR US GUIDE VASC ACCESS LEFT  12/04/2019   OPEN REDUCTION INTERNAL FIXATION (ORIF) DISTAL RADIAL FRACTURE Left 02/19/2017   Procedure: OPEN REDUCTION INTERNAL FIXATION (ORIF) LEFT DISTAL RADIAL FRACTURE;  Surgeon: Leanora Cover, MD;  Location: [facility];  Service: Orthopedics;  Laterality: Left;   OVARIAN CYST REMOVAL     RADIOLOGY WITH ANESTHESIA N/A 12/04/2019   Procedure: IR WITH ANESTHESIA;  Surgeon: Luanne Bras, MD;  Location: [facility];  Service: Radiology;  Laterality: N/A;   RENAL DOPPLER  09/09/2012   Right proximal renal artery: 60-99% diameter reduction. Normal patency of the left main renal artery.    TRANSTHORACIC ECHOCARDIOGRAM  03/22/2006   EF 55-60%, mild mitral valvular regurg, pulmonary artery systolic pressure was moderately increased.   TUBAL LIGATION      SOCIAL HISTORY: Social History   Socioeconomic History   Marital status: Married    Spouse name: Richard   Number  of children: 4   Years of education: Not on file   Highest education level: Not on file  Occupational History   Occupation: retired Proofreader    Comment: rehab RN  Tobacco Use   Smoking status: Never   Smokeless tobacco: Never  Vaping Use   Vaping Use: Never used  Substance and Sexual Activity   Alcohol use: Yes    Alcohol/week: 2.0 standard drinks of alcohol    Types: 2 Glasses of wine per week    Comment: rare   Drug use: No   Sexual activity: Yes    Partners: Male    Birth control/protection: Post-menopausal  Other Topics Concern   Not on file  Social History Narrative   Lives with spouse   Social Determinants of Health   Financial Resource Strain: Not on file  Food Insecurity: Not on file  Transportation Needs: Not on file  Physical Activity: Not on file  Stress: Not on file  Social Connections: Not on file  Intimate Partner Violence: Not on file    FAMILY HISTORY: Family History  Problem Relation Age of Onset   Alzheimer's disease Mother    Pneumonia Mother    Heart failure Father    Heart disease Sister    Heart disease Brother    Heart disease Brother    Heart disease Sister    Pneumonia Sister     ALLERGIES:  is allergic to epinephrine, cozaar [losartan], crestor [rosuvastatin], septra [sulfamethoxazole-trimethoprim], vytorin [ezetimibe-simvastatin], benazepril hcl, crestor [rosuvastatin calcium], and lisinopril.  MEDICATIONS:  Current Outpatient Medications  Medication Sig Dispense Refill   acetaminophen (TYLENOL) 650 MG CR tablet Take 650 mg by mouth 2 (two) times daily.     apixaban (ELIQUIS) 5 MG TABS tablet Take 1 tablet (5 mg total) by mouth 2 (two) times daily. 60 tablet 1   cephALEXin (KEFLEX) 500 MG capsule Take 1 capsule (500 mg total) by mouth 4 (four) times daily. 20 capsule 0   cholecalciferol (VITAMIN D) 1000 units tablet Take 1,000 Units by mouth daily.     diclofenac Sodium (VOLTAREN) 1 % GEL Apply 2 g topically 4 (four) times daily.      dronedarone (MULTAQ) 400 MG tablet Take 1 tablet (400 mg total) by mouth 2 (two) times daily with a meal. (Patient not taking: Reported on 04/23/2022) 60 tablet 0   escitalopram (LEXAPRO) 10 MG tablet Take 10 mg by mouth daily.     gabapentin (NEURONTIN) 100 MG capsule Take 100 mg by mouth daily as needed (pain).     Magnesium 200 MG TABS Take 1 tablet (200 mg total) by mouth daily. 30 tablet 0   metoprolol (TOPROL-XL) 200 MG 24 hr tablet Take 100 mg by mouth 2 (two) times daily.     nitroGLYCERIN (NITROSTAT) 0.4 MG SL tablet PLACE 1 TABLET (0.4  MG TOTAL) UNDER THE TONGUE EVERY 5 (FIVE) MINUTES AS NEEDED FOR CHEST PAIN. 75 tablet 1   olmesartan (BENICAR) 20 MG tablet Take 10 mg by mouth daily.     REPATHA SURECLICK 614 MG/ML SOAJ Inject 140 mg into the skin See admin instructions. Take '140mg'$  SQ every two weeks per patient (Patient not taking: Reported on 04/10/2022)     vitamin B-12 (CYANOCOBALAMIN) 100 MCG tablet Take 50 mcg by mouth daily.     No current facility-administered medications for this visit.    REVIEW OF SYSTEMS:   Constitutional: ( - ) fevers, ( - )  chills , ( - ) night sweats Eyes: ( - ) blurriness of vision, ( - ) double vision, ( - ) watery eyes Ears, nose, mouth, throat, and face: ( - ) mucositis, ( - ) sore throat Respiratory: ( - ) cough, ( - ) dyspnea, ( - ) wheezes Cardiovascular: ( - ) palpitation, ( - ) chest discomfort, ( - ) lower extremity swelling Gastrointestinal:  ( - ) nausea, ( - ) heartburn, ( - ) change in bowel habits Skin: ( - ) abnormal skin rashes Lymphatics: ( - ) new lymphadenopathy, ( - ) easy bruising Neurological: ( - ) numbness, ( - ) tingling, ( - ) new weaknesses Behavioral/Psych: ( - ) mood change, ( - ) new changes  All other systems were reviewed with the patient and are negative.  PHYSICAL EXAMINATION:  Vitals:   04/26/22 1505  BP: 131/83  Pulse: 72  Resp: 15  Temp: (!) 97.5 F (36.4 C)  SpO2: 100%    Filed Weights   04/26/22  1505  Weight: 131 lb 8 oz (59.6 kg)     GENERAL: Well-appearing elderly Caucasian female, alert, no distress and comfortable SKIN: skin color, texture, turgor are normal, no rashes or significant lesions EYES: conjunctiva are pink and non-injected, sclera clear LUNGS: clear to auscultation and percussion with normal breathing effort HEART: regular rate & rhythm and no murmurs and no lower extremity edema Musculoskeletal: no cyanosis of digits and no clubbing  PSYCH: alert & oriented x 3, fluent speech NEURO: no focal motor/sensory deficits  LABORATORY DATA:  I have reviewed the data as listed    Latest Ref Rng & Units 04/26/2022    2:41 PM 04/21/2022    2:41 PM 04/10/2022   10:44 AM  CBC  WBC 4.0 - 10.5 K/uL 4.3  6.2  5.5   Hemoglobin 12.0 - 15.0 g/dL 10.3  11.0  11.7   Hematocrit 36.0 - 46.0 % 32.3  35.1  38.6   Platelets 150 - 400 K/uL 243  278  270        Latest Ref Rng & Units 04/21/2022    2:41 PM 04/10/2022   10:44 AM 01/22/2022    3:15 PM  CMP  Glucose 70 - 99 mg/dL 110  110  111   BUN 8 - 23 mg/dL '12  8  12   '$ Creatinine 0.44 - 1.00 mg/dL 0.75  0.66  0.68   Sodium 135 - 145 mmol/L 140  142  140   Potassium 3.5 - 5.1 mmol/L 3.8  3.8  3.5   Chloride 98 - 111 mmol/L 103  107  107   CO2 22 - 32 mmol/L '28  26  30   '$ Calcium 8.9 - 10.3 mg/dL 10.9  10.6  10.7   Total Protein 6.5 - 8.1 g/dL   6.6   Total Bilirubin 0.3 - 1.2  mg/dL   0.3   Alkaline Phos 38 - 126 U/L   56   AST 15 - 41 U/L   8   ALT 0 - 44 U/L   <5     RADIOGRAPHIC STUDIES: DG Chest Port 1 View  Result Date: 04/21/2022 CLINICAL DATA:  Upper back pain and shortness of breath. EXAM: PORTABLE CHEST 1 VIEW COMPARISON:  04/10/2022 FINDINGS: The heart size and mediastinal contours are within normal limits. There is no evidence of pulmonary edema, consolidation, pneumothorax or pleural fluid. The visualized skeletal structures are unremarkable. IMPRESSION: No active disease. Electronically Signed   By: Aletta Edouard M.D.   On: 04/21/2022 15:19   PCV ECHOCARDIOGRAM COMPLETE  Result Date: 04/19/2022 Echocardiogram 04/16/2022: Normal LV systolic function with visual EF 55-60%. Left ventricle cavity is normal in size. Normal global wall motion. Doppler evidence of grade III (restrictive) diastolic dysfunction, elevated LAP. Calculated EF 53%. Left atrial cavity is mildly dilated at 3.9 cm. Structurally normal mitral valve.  Mild (Grade I) mitral regurgitation. Structurally normal tricuspid valve.  Mild to moderate tricuspid regurgitation. Compared to 12/9483, diastolic dysfunction is worse.   DG Chest Port 1 View  Result Date: 04/10/2022 CLINICAL DATA:  shortness of breath EXAM: PORTABLE CHEST 1 VIEW COMPARISON:  March third 2020 FINDINGS: The cardiomediastinal silhouette is unchanged in contour.Atherosclerotic calcifications of the tortuous thoracic aorta. No pleural effusion. No pneumothorax. LEFT lateral linear opacities most consistent with atelectasis/scar. No acute pleuroparenchymal abnormality. Visualized abdomen is unremarkable. IMPRESSION: No acute cardiopulmonary abnormality. Electronically Signed   By: Valentino Saxon M.D.   On: 04/10/2022 11:23    ASSESSMENT & PLAN NOBLE BODIE 87 y.o. female with medical history significant for iron deficiency anemia of unclear etiology who presents for a follow up visit.   # Iron Deficiency Anemia 2/2 Unclear Etiology  # Anemia of Chronic Disease/Inflammation  -- Findings are consistent with anemia of chronic disease with a likely component of iron deficiency anemia secondary to an unclear etiology --Patient had markedly elevated inflammatory markers previously and has known history of rheumatoid arthritis. --today will order iron panel and ferritin as well as reticulocytes, CBC, and CMP -- Patient unable to tolerate p.o. iron therapy due to diarrhea. -- Patient is status post 2 doses of IV Feraheme in February 2023 --Labs today show white blood cell  count 4.3, hemoglobin 10.3, MCV 98.2, and platelets of 243 --iron labs currently pending. Iron sat 6% in Feb 2023. Increased to 14 % with low TIBC in July 2023.  --RTC in 6 months time to reassesses.   No orders of the defined types were placed in this encounter.   All questions were answered. The patient knows to call the clinic with any problems, questions or concerns.  A total of more than 30 minutes were spent on this encounter with face-to-face time and non-face-to-face time, including preparing to see the patient, ordering tests and/or medications, counseling the patient and coordination of care as outlined above.   Ledell Peoples, MD Department of Hematology/Oncology [place] [facility] Phone: [phone] Pager: [phone] Email: [email or web address]  04/26/2022 3:07 PM

## 2022-04-27 ENCOUNTER — Telehealth: Payer: Self-pay | Admitting: Hematology and Oncology

## 2022-04-27 LAB — FERRITIN: Ferritin: 143 ng/mL (ref 11–307)

## 2022-04-27 NOTE — Telephone Encounter (Signed)
Per 10/19 los called and spoke to pt about appointment

## 2022-04-28 ENCOUNTER — Emergency Department (HOSPITAL_BASED_OUTPATIENT_CLINIC_OR_DEPARTMENT_OTHER)
Admission: EM | Admit: 2022-04-28 | Discharge: 2022-04-28 | Disposition: A | Discharge status: Home / Self Care | Payer: Medicare Other | Attending: Emergency Medicine | Admitting: Emergency Medicine

## 2022-04-28 ENCOUNTER — Encounter (HOSPITAL_BASED_OUTPATIENT_CLINIC_OR_DEPARTMENT_OTHER): Payer: Self-pay

## 2022-04-28 ENCOUNTER — Emergency Department (HOSPITAL_BASED_OUTPATIENT_CLINIC_OR_DEPARTMENT_OTHER): Payer: Medicare Other

## 2022-04-28 DIAGNOSIS — N12 Tubulo-interstitial nephritis, not specified as acute or chronic: Secondary | ICD-10-CM

## 2022-04-28 DIAGNOSIS — Z79899 Other long term (current) drug therapy: Secondary | ICD-10-CM | POA: Diagnosis not present

## 2022-04-28 DIAGNOSIS — R109 Unspecified abdominal pain: Secondary | ICD-10-CM | POA: Diagnosis present

## 2022-04-28 DIAGNOSIS — Z7901 Long term (current) use of anticoagulants: Secondary | ICD-10-CM | POA: Diagnosis not present

## 2022-04-28 LAB — URINALYSIS, ROUTINE W REFLEX MICROSCOPIC
Bilirubin Urine: NEGATIVE
Glucose, UA: NEGATIVE mg/dL
Ketones, ur: NEGATIVE mg/dL
Nitrite: POSITIVE — AB
Protein, ur: NEGATIVE mg/dL
Specific Gravity, Urine: 1.015 (ref 1.005–1.030)
WBC, UA: >50 WBC/hpf — ABNORMAL HIGH (ref 0–5)
pH: 6 (ref 5.0–8.0)

## 2022-04-28 MED ORDER — CIPROFLOXACIN HCL 500 MG PO TABS: 500.0000 mg | ORAL_TABLET | Freq: Two times a day (BID) | ORAL | 0 refills | Status: AC

## 2022-04-28 MED ORDER — SODIUM CHLORIDE 0.9 % IV SOLN
1.0000 g | Freq: Once | INTRAVENOUS | Status: AC
  Administered 2022-04-28: 1 g via INTRAVENOUS
  Filled 2022-04-28: qty 10

## 2022-04-28 NOTE — ED Triage Notes (Signed)
She c/o left flank area discomfort. She states she has felt this way in the past and was dx with "uti". She denies fever/n/v/d, nor any other sign of current illness. She is ambulatory with rolling walker and her husband is with her.

## 2022-04-28 NOTE — ED Notes (Signed)
Discharge paperwork given and verbally understood. 

## 2022-04-28 NOTE — ED Provider Notes (Cosign Needed Addendum)
[facility] EMERGENCY DEPT Provider Note   CSN: [id number] Arrival date & time: 04/28/22  1235     History  Chief Complaint  Patient presents with   Flank Pain    Jeanette [name] is a 87 y.o. female.   Flank Pain  Patient is an 87 year old female present emergency room today with complaints of left flank pain since that her symptoms have been going on since recent hospital visit for A-fib where she was cardioverted on 10/3.  She states her symptoms started 5 days before this in late September.  Seems that she has had some dysuria.  She denies any fever nausea vomiting or diarrhea.  She denies any abdominal pain lightheadedness or dizziness.  She states she feels well apart from the burning when she urinates and occasionally feeling that she is urinating more often.  She was started on Keflex and took a full course of this but feels that her symptoms may have improved somewhat but then returned.      Home Medications Prior to Admission medications   Medication Sig Start Date End Date Taking? Authorizing Provider  ciprofloxacin (CIPRO) 500 MG tablet Take 1 tablet (500 mg total) by mouth every 12 (twelve) hours for 7 days. 04/28/22 05/05/22 Yes Rashawn Rolon S, PA  acetaminophen (TYLENOL) 650 MG CR tablet Take 650 mg by mouth 2 (two) times daily.    [provider]  apixaban (ELIQUIS) 5 MG TABS tablet Take 1 tablet (5 mg total) by mouth 2 (two) times daily. 12/06/19   Rinehuls, Early Chars, PA-C  cephALEXin (KEFLEX) 500 MG capsule Take 1 capsule (500 mg total) by mouth 4 (four) times daily. 04/10/22   Godfrey Pick, MD  cholecalciferol (VITAMIN D) 1000 units tablet Take 1,000 Units by mouth daily.    [provider]  diclofenac Sodium (VOLTAREN) 1 % GEL Apply 2 g topically 4 (four) times daily.    [provider]  dronedarone (MULTAQ) 400 MG tablet Take 1 tablet (400 mg total) by mouth 2 (two) times daily with a meal. Patient not taking: Reported  on 04/23/2022 04/21/22   Fredia Sorrow, MD  escitalopram (LEXAPRO) 10 MG tablet Take 10 mg by mouth daily.    [provider]  gabapentin (NEURONTIN) 100 MG capsule Take 100 mg by mouth daily as needed (pain).    [provider]  Magnesium 200 MG TABS Take 1 tablet (200 mg total) by mouth daily. 04/21/22   Fredia Sorrow, MD  metoprolol (TOPROL-XL) 200 MG 24 hr tablet Take 100 mg by mouth 2 (two) times daily.    [provider]  nitroGLYCERIN (NITROSTAT) 0.4 MG SL tablet PLACE 1 TABLET (0.4 MG TOTAL) UNDER THE TONGUE EVERY 5 (FIVE) MINUTES AS NEEDED FOR CHEST PAIN. 07/13/19 04/10/22  Patwardhan, Reynold Bowen, MD  olmesartan (BENICAR) 20 MG tablet Take 10 mg by mouth daily.    [provider]  REPATHA SURECLICK 063 MG/ML SOAJ Inject 140 mg into the skin See admin instructions. Take '140mg'$  SQ every two weeks per patient Patient not taking: Reported on 04/10/2022 10/12/19   [provider]  vitamin B-12 (CYANOCOBALAMIN) 100 MCG tablet Take 50 mcg by mouth daily.    [provider]      Allergies    Epinephrine, Cozaar [losartan], Crestor [rosuvastatin], Septra [sulfamethoxazole-trimethoprim], Vytorin [ezetimibe-simvastatin], Benazepril hcl, Crestor [rosuvastatin calcium], and Lisinopril    Review of Systems   Review of Systems  Genitourinary:  Positive for flank pain.    Physical Exam  Updated Vital Signs BP (!) 142/80 (BP Location: Right Arm)   Pulse 68   Temp 97.9 F (36.6 C) (Oral)   Resp 16   SpO2 94%  Physical Exam Vitals and nursing note reviewed.  Constitutional:      General: She is not in acute distress.    Comments: Pleasant well-appearing 87 year old.  In no acute distress.  Sitting comfortably in bed.  Able answer questions appropriately follow commands. No increased work of breathing. Speaking in full sentences.   HENT:     Head: Normocephalic and atraumatic.     Nose: Nose normal.  Eyes:     General: No scleral  icterus. Cardiovascular:     Rate and Rhythm: Normal rate and regular rhythm.     Pulses: Normal pulses.     Heart sounds: Normal heart sounds.  Pulmonary:     Effort: Pulmonary effort is normal. No respiratory distress.     Breath sounds: No wheezing.  Abdominal:     Palpations: Abdomen is soft.     Tenderness: There is no abdominal tenderness. There is no right CVA tenderness, left CVA tenderness, guarding or rebound.     Comments: No CVA tenderness  Musculoskeletal:     Cervical back: Normal range of motion.     Right lower leg: No edema.     Left lower leg: No edema.  Skin:    General: Skin is warm and dry.     Capillary Refill: Capillary refill takes less than 2 seconds.  Neurological:     Mental Status: She is alert. Mental status is at baseline.  Psychiatric:        Mood and Affect: Mood normal.        Behavior: Behavior normal.     ED Results / Procedures / Treatments   Labs (all labs ordered are listed, but only abnormal results are displayed) Labs Reviewed  URINALYSIS, ROUTINE W REFLEX MICROSCOPIC - Abnormal; Notable for the following components:      Result Value   APPearance HAZY (*)    Hgb urine dipstick TRACE (*)    Nitrite POSITIVE (*)    Leukocytes,Ua LARGE (*)    WBC, UA >50 (*)    Bacteria, UA FEW (*)    All other components within normal limits  URINE CULTURE    EKG None  Radiology CT Renal Stone Study  Result Date: 04/28/2022 CLINICAL DATA:  Left flank pain. EXAM: CT ABDOMEN AND PELVIS WITHOUT CONTRAST TECHNIQUE: Multidetector CT imaging of the abdomen and pelvis was performed following the standard protocol without IV contrast. RADIATION DOSE REDUCTION: This exam was performed according to the departmental dose-optimization program which includes automated exposure control, adjustment of the mA and/or kV according to patient size and/or use of iterative reconstruction technique. COMPARISON:  March 26, 2013. FINDINGS: Lower chest: Small  bilateral pleural effusions are noted. Hepatobiliary: No gallstones or biliary dilatation is noted. Hepatic cysts are noted. Pancreas: Unremarkable. No pancreatic ductal dilatation or surrounding inflammatory changes. Spleen: Normal in size without focal abnormality. Adrenals/Urinary Tract: Adrenal glands and kidneys appear normal. No hydronephrosis or renal obstruction is noted. No renal or ureteral calculi are noted. Mild inflammatory changes noted around the bladder concerning for cystitis. Stomach/Bowel: Stomach is within normal limits. Appendix appears normal. No evidence of bowel wall thickening, distention, or inflammatory changes. Sigmoid diverticulosis is noted without inflammation. Vascular/Lymphatic: Aortic atherosclerosis. No enlarged abdominal or pelvic lymph nodes. Reproductive: Uterus and bilateral adnexa are unremarkable. Other: No abdominal wall hernia or  abnormality. No abdominopelvic ascites. Musculoskeletal: No acute or significant osseous findings. IMPRESSION: Small bilateral pleural effusions are noted. Mild inflammatory changes noted around the urinary bladder concerning for cystitis. Sigmoid diverticulosis without inflammation. Aortic Atherosclerosis (ICD10-I70.0). Electronically Signed   By: Marijo Conception M.D.   On: 04/28/2022 17:21    Procedures Procedures    Medications Ordered in ED Medications  cefTRIAXone (ROCEPHIN) 1 g in sodium chloride 0.9 % 100 mL IVPB (0 g Intravenous Stopped 04/28/22 1840)    ED Course/ Medical Decision Making/ A&P Clinical Course as of 04/28/22 1851  Sat Apr 28, 2022  1636 UTI sx since 10/3 and started on keflex. last keflex Thursday  [WF]    Clinical Course User Index [WF] Tedd Sias, [place]                           Medical Decision Making Amount and/or Complexity of Data Reviewed Labs: ordered. Radiology: ordered.  Risk Prescription drug management.   This patient presents to the ED for concern of dysuria and flank pain, this  involves a number of treatment options, and is a complaint that carries with it a moderate risk of complications and morbidity. A differential diagnosis was considered for the patient's symptoms which is discussed below:   The differential diagnosis of emergent flank pain includes, but is not limited to :Abdominal aortic aneurysm,, Renal artery embolism,Renal vein thrombosis, Aortic dissection, Mesenteric ischemia, Pyelonephritis, Renal infarction, Renal hemorrhage, Nephrolithiasis/ Renal Colic, Bladder tumor,Cystitis, Biliary colic, Pancreatitis Perforated peptic ulcer Appendicitis ,Inguinal Hernia, Diverticulitis, Bowel obstruction Ectopic Pregnancy,PID/TOA,Ovarian cyst, Ovarian torsion. Shingles Lower lobe pneumonia, Retroperitoneal hematoma/abscess/tumor, Epidural abscess, Epidural hematoma    Co morbidities: Discussed in HPI   Brief History:  Patient is an 87 year old female present emergency room today with complaints of left flank pain since that her symptoms have been going on since recent hospital visit for A-fib where she was cardioverted on 10/3.  She states her symptoms started 5 days before this in late September.  Seems that she has had some dysuria.  She denies any fever nausea vomiting or diarrhea.  She denies any abdominal pain lightheadedness or dizziness.  She states she feels well apart from the burning when she urinates and occasionally feeling that she is urinating more often.  She was started on Keflex and took a full course of this but feels that her symptoms may have improved somewhat but then returned.     EMR reviewed including pt PMHx, past surgical history and past visits to ER.   See HPI for more details   Lab Tests:  I reviewed patient's labs from 2 days ago done at PCP.  No significant abnormal findings, no leukocytosis, normal kidney function I ordered and independently interpreted labs. Labs notable for few bacteria greater than 50 WBCs positive for  leukocytes and nitrates will obtain urine culture.   Imaging Studies:  Abnormal findings. I personally reviewed all imaging studies. Imaging notable for  CT renal stone study with no stones.  Does have some evidence of cystitis on CT.  No other abnormal findings.  Cardiac Monitoring:  NA NA   Medicines ordered:  I ordered medication including Rocephin for UTI Reevaluation of the patient after these medicines showed that the patient stayed the same I have reviewed the patients home medicines and have made adjustments as needed   Critical Interventions:     Consults/Attending Physician   I discussed this case with my attending  physician who cosigned this note including patient's presenting symptoms, physical exam, and planned diagnostics and interventions. Attending physician stated agreement with plan or made changes to plan which were implemented.   Reevaluation:  After the interventions noted above I re-evaluated patient and found that they have :stayed the same   Social Determinants of Health:      Problem List / ED Course:  UTI symptoms.  Previous urine culture with multiple species and no speciation/not particularly helpful.  Will reobtain urine culture today.  Treat with ciprofloxacin recommend follow-up with urology.  Return precautions discussed   Dispostion:  After consideration of the diagnostic results and the patients response to treatment, I feel that the patent would benefit from close outpatient follow-up.  Return precautions, treatment with ciprofloxacin  Final Clinical Impression(s) / ED Diagnoses Final diagnoses:  Pyelonephritis    Rx / DC Orders ED Discharge Orders          Ordered    ciprofloxacin (CIPRO) 500 MG tablet  Every 12 hours        04/28/22 1820              Tedd Sias, [place] 04/28/22 2244    Tedd Sias, [place] 04/28/22 2244    Tegeler, Gwenyth Allegra, MD 04/29/22 0005

## 2022-04-28 NOTE — Discharge Instructions (Addendum)
I am treating your urinary tract infection with antibiotic called ciprofloxacin. Please take all of your antibiotics until finished!   You may develop abdominal discomfort or diarrhea from the antibiotic.  You may help offset this with probiotics which you can buy or get in yogurt. Do not eat  or take the probiotics until 2 hours after your antibiotic.   I have given you the information for urology office.  Please follow-up with them or your primary care doctor whoever you can be seen by first.

## 2022-05-01 ENCOUNTER — Telehealth: Payer: Self-pay

## 2022-05-01 ENCOUNTER — Other Ambulatory Visit: Payer: Self-pay | Admitting: Cardiology

## 2022-05-01 DIAGNOSIS — I480 Paroxysmal atrial fibrillation: Secondary | ICD-10-CM

## 2022-05-01 DIAGNOSIS — I484 Atypical atrial flutter: Secondary | ICD-10-CM

## 2022-05-01 LAB — URINE CULTURE: Culture: >=100000 — AB

## 2022-05-01 MED ORDER — AMIODARONE HCL 200 MG PO TABS: 200.0000 mg | ORAL_TABLET | ORAL | 1 refills | Status: DC

## 2022-05-01 NOTE — Telephone Encounter (Signed)
Spoke with the patient. You do not need to call.  Will stop Multaq. Recommend amiodarone 200 mg bid for 1 week, then 200 mg daily until f/u w/me on 11/17. Baseline LFT, TSH are normal.  Thanks MJP

## 2022-05-01 NOTE — Telephone Encounter (Signed)
Patient called and stated that she was recently prescribed MULTAQ and is was severely sick to her stomach, so she stopped taking it. She wants to know if there is anything else she can take. Please advise.

## 2022-05-02 ENCOUNTER — Telehealth (HOSPITAL_BASED_OUTPATIENT_CLINIC_OR_DEPARTMENT_OTHER): Payer: Self-pay | Admitting: *Deleted

## 2022-05-02 NOTE — Telephone Encounter (Signed)
Post ED Visit - Positive Culture Follow-up  Culture report reviewed by antimicrobial stewardship pharmacist: [place] Team '[]'$  Elenor Quinones, Pharm.D. '[]'$  Heide Guile, Pharm.D., BCPS AQ-ID '[]'$  Parks Neptune, Pharm.D., BCPS '[]'$  Alycia Rossetti, Pharm.D., BCPS '[]'$  [place], Pharm.D., BCPS, AAHIVP '[]'$  Legrand Como, Pharm.D., BCPS, AAHIVP '[]'$  Salome Arnt, PharmD, BCPS '[]'$  Johnnette Gourd, PharmD, BCPS '[]'$  Hughes Better, PharmD, BCPS '[]'$  Leeroy Cha, PharmD '[]'$  Laqueta Linden, PharmD, BCPS '[]'$  Albertina Parr, PharmD  [place] Team '[]'$  Leodis Sias, PharmD '[]'$  Lindell Spar, PharmD '[]'$  Royetta Asal, PharmD '[]'$  Graylin Shiver, Rph '[]'$  Rema Fendt) Glennon Mac, PharmD '[]'$  Arlyn Dunning, PharmD '[]'$  Netta Cedars, PharmD '[]'$  Dia Sitter, PharmD '[]'$  Leone Haven, PharmD '[]'$  Gretta Arab, PharmD '[]'$  Theodis Shove, PharmD '[]'$  Peggyann Juba, PharmD '[]'$  Reuel Boom, PharmD   Positive urine culture Treated with Ciprofloxacin, organism sensitive to the same and no further patient follow-up is required at this time.  Esmeralda Arthur, Pharm D  Ardeen Fillers 05/02/2022, 12:27 PM

## 2022-05-07 ENCOUNTER — Ambulatory Visit: Payer: Medicare Other | Admitting: Internal Medicine

## 2022-05-22 DIAGNOSIS — R159 Full incontinence of feces: Secondary | ICD-10-CM | POA: Insufficient documentation

## 2022-05-22 NOTE — Progress Notes (Signed)
Follow up visit  Subjective:   Jeanette [name], female    DOB: [birth year], 87 y.o.   MRN: [id number]   HPI  Chief Complaint  Patient presents with   Atrial Fibrillation   Follow-up    4 week    87 y.o. Caucasian female with hypertension, paroxysmal atrial fibrillation, h/o stroke  Patient stopped multaq due to nausea symptoms. She was then started on amiodarone 200 mg bidX1 week, then to be reduced to 200 mg daily. However, she did not start amiodarone due to concerns of side effects.  She feels that her heart rate is still irregular at times.    Current Outpatient Medications:    acetaminophen (TYLENOL) 650 MG CR tablet, Take 650 mg by mouth 2 (two) times daily., Disp: , Rfl:    amiodarone (PACERONE) 200 MG tablet, Take 1 tablet (200 mg total) by mouth as directed. 200 mg twice daily for 1 week, then 200 mg once daily, Disp: 90 tablet, Rfl: 1   apixaban (ELIQUIS) 5 MG TABS tablet, Take 1 tablet (5 mg total) by mouth 2 (two) times daily., Disp: 60 tablet, Rfl: 1   cephALEXin (KEFLEX) 500 MG capsule, Take 1 capsule (500 mg total) by mouth 4 (four) times daily., Disp: 20 capsule, Rfl: 0   cholecalciferol (VITAMIN D) 1000 units tablet, Take 1,000 Units by mouth daily., Disp: , Rfl:    diclofenac Sodium (VOLTAREN) 1 % GEL, Apply 2 g topically 4 (four) times daily., Disp: , Rfl:    escitalopram (LEXAPRO) 10 MG tablet, Take 10 mg by mouth daily., Disp: , Rfl:    gabapentin (NEURONTIN) 100 MG capsule, Take 100 mg by mouth daily as needed (pain)., Disp: , Rfl:    Magnesium 200 MG TABS, Take 1 tablet (200 mg total) by mouth daily., Disp: 30 tablet, Rfl: 0   metoprolol (TOPROL-XL) 200 MG 24 hr tablet, Take 100 mg by mouth 2 (two) times daily., Disp: , Rfl:    nitroGLYCERIN (NITROSTAT) 0.4 MG SL tablet, PLACE 1 TABLET (0.4 MG TOTAL) UNDER THE TONGUE EVERY 5 (FIVE) MINUTES AS NEEDED FOR CHEST PAIN., Disp: 75 tablet, Rfl: 1   olmesartan (BENICAR) 20 MG tablet, Take 10 mg by mouth daily., Disp:  , Rfl:    REPATHA SURECLICK 945 MG/ML SOAJ, Inject 140 mg into the skin See admin instructions. Take 141m SQ every two weeks per patient (Patient not taking: Reported on 04/10/2022), Disp: , Rfl:    vitamin B-12 (CYANOCOBALAMIN) 100 MCG tablet, Take 50 mcg by mouth daily., Disp: , Rfl:    Cardiovascular & other pertient studies:  Reviewed external labs and tests, independently interpreted  Mobile cardiac telemetry 14 days 04/16/2022 - 04/30/2022: Dominant rhythm: Sinus. HR 58-71 bpm. Avg HR 96 bpm, in sinus rhythm. 163 episodes of probable atrial tachycardia, fastest at 214 bpm for 4 beats, longest for 12.5 secs at 112 bpm. Atrial fibrillation/flutter occurred with 36% burden. Ventricular rate 49-207 bpm, avg rate 139 bpm. <1% isolated SVE, couplet/triplets. 1 episode of VT, fastest at 167 bpm for 9 beats. <% isolated VE, couplets. 6.5 sec sinus pause on 10/14 4:18 PM (Correlated with electrical cardioversion). No high grade AV block. 1 patient triggered event correlated with SVE.    EKG 05/25/2022: Sinus rhythm 57 bpm Nonspecific T-abnormality  Recent labs: 04/26/2022: Glucose 30, BUN/Cr 8/0.64. EGFR >60. Na/K 140/4.0. Rest of the CMP normal H/H 10/32. MCV 98. Platelets 243 TSH 3.1 normal  04/10/2022: Glucose 110, BUN/Cr 8/0.66. EGFR >60. Na/K 142/3.8. BNP  573 H/H 11.7/38.6. MCV 101. Platelets 270   Review of Systems  Cardiovascular:  Positive for palpitations (Currently resolved). Negative for chest pain, dyspnea on exertion, leg swelling and syncope.         Vitals:   05/25/22 0909  BP: (!) 155/78  Pulse: 60  Resp: 16  SpO2: 98%    Body mass index is 22.32 kg/m. Filed Weights   05/25/22 0909  Weight: 126 lb (57.2 kg)     Objective:   Physical Exam Vitals and nursing note reviewed.  Constitutional:      General: She is not in acute distress. Neck:     Vascular: No JVD.  Cardiovascular:     Rate and Rhythm: Normal rate and regular rhythm.     Heart  sounds: Normal heart sounds. No murmur heard. Pulmonary:     Effort: Pulmonary effort is normal.     Breath sounds: Normal breath sounds. No wheezing or rales.  Musculoskeletal:     Right lower leg: No edema.     Left lower leg: No edema.            Visit diagnoses:   ICD-10-CM   1. Paroxysmal atrial fibrillation (HCC)  I48.0 EKG 12-Lead    2. H/O: stroke  Z86.73 PCV CAROTID DUPLEX (BILATERAL)    3. Paroxysmal atrial flutter ([place])  I48.92 Ambulatory referral to Cardiac Electrophysiology       Orders Placed This Encounter  Procedures   Ambulatory referral to Cardiac Electrophysiology   EKG 12-Lead   PCV CAROTID DUPLEX (BILATERAL)       Assessment & Recommendations:   87 y.o. Caucasian female with hypertension, paroxysmal atrial fibrillation, h/o stroke  Paroxysmal AFIb/flutter: Did not tolerate Multaq. Does not want to use amiodarone. While she is in sinus rhythm today, recent ZioPatch showed 36% Afib/flutter burden. Recommend referral to EP. CHA2DS2VASc score 6, annual stroke risk 8% On Eliquis 5 mg bid  H/o stroke: Patient is concerned about a "whoosh sound" and is concerned about carotid bruit. Given h/o stroke, will check carotid US.  F/u in 3 months    Nigel Mormon, MD Pager: [phone] Office: [phone]

## 2022-05-25 ENCOUNTER — Encounter: Payer: Self-pay | Admitting: Cardiology

## 2022-05-25 ENCOUNTER — Ambulatory Visit: Payer: Medicare Other | Admitting: Cardiology

## 2022-05-25 VITALS — BP 155/78 | HR 60 | Resp 16 | Ht 63.0 in | Wt 126.0 lb

## 2022-05-25 DIAGNOSIS — Z8673 Personal history of transient ischemic attack (TIA), and cerebral infarction without residual deficits: Secondary | ICD-10-CM

## 2022-05-25 DIAGNOSIS — I4892 Unspecified atrial flutter: Secondary | ICD-10-CM | POA: Insufficient documentation

## 2022-05-25 DIAGNOSIS — I480 Paroxysmal atrial fibrillation: Secondary | ICD-10-CM

## 2022-06-25 NOTE — [facility] Notes (Deleted)
Electrophysiology Office Note:    Date:  06/25/2022   ID:  Jeanette [name], DOB 1935/03/13, MRN [id number]  PCP:  Crist Infante, [place] Providers Cardiologist:  None Electrophysiologist:  Melida Quitter, MD { Click to update primary MD,subspecialty MD or APP then REFRESH:1}    Referring MD: Nigel Mormon, MD   History of Present Illness:    Jeanette [name] is a 87 y.o. female with a hx listed below, significant for PAF, stroke, hypertension, referred for arrhythmia management.    Past Medical History:  Diagnosis Date   A-fib ([place]) 03/15/2018   Anal cancer ([place])    Anxiety    Arthritis    Breast cancer ([place])    Breast cancer ([place])    Cancer of breast ([place])    Depression    Dysrhythmia 2010   a-fib   Hyperlipidemia    Hypertension    PVD (peripheral vascular disease) ([place])    RAS (renal artery stenosis) ([place])    Stroke ([place]) 2019   no residual, x 2    Past Surgical History:  Procedure Laterality Date   BREAST BIOPSY Left 01/14/2003   BREAST LUMPECTOMY Right 1989   CARDIOVASCULAR STRESS TEST  07/27/2003   No evidence if LV myocardial ischemia or scar. LV EF 74%.   GYNECOLOGIC CRYOSURGERY     IR CT HEAD LTD  12/04/2019   IR PERCUTANEOUS ART THROMBECTOMY/INFUSION INTRACRANIAL INC DIAG ANGIO  12/04/2019   IR US GUIDE VASC ACCESS LEFT  12/04/2019   OPEN REDUCTION INTERNAL FIXATION (ORIF) DISTAL RADIAL FRACTURE Left 02/19/2017   Procedure: OPEN REDUCTION INTERNAL FIXATION (ORIF) LEFT DISTAL RADIAL FRACTURE;  Surgeon: Leanora Cover, MD;  Location: [facility];  Service: Orthopedics;  Laterality: Left;   OVARIAN CYST REMOVAL     RADIOLOGY WITH ANESTHESIA N/A 12/04/2019   Procedure: IR WITH ANESTHESIA;  Surgeon: Luanne Bras, MD;  Location: [facility];  Service: Radiology;  Laterality: N/A;   RENAL DOPPLER  09/09/2012   Right proximal renal artery: 60-99% diameter reduction. Normal patency of the left main renal artery.    TRANSTHORACIC  ECHOCARDIOGRAM  03/22/2006   EF 55-60%, mild mitral valvular regurg, pulmonary artery systolic pressure was moderately increased.   TUBAL LIGATION      Current Medications: No outpatient medications have been marked as taking for the 06/26/22 encounter (Appointment) with Jerie Basford, Yetta Barre, MD.     Allergies:   Epinephrine, Cozaar [losartan], Crestor [rosuvastatin], Septra [sulfamethoxazole-trimethoprim], Vytorin [ezetimibe-simvastatin], Benazepril hcl, Crestor [rosuvastatin calcium], and Lisinopril   Social History   Socioeconomic History   Marital status: Married    Spouse name: Richard   Number of children: 4   Years of education: Not on file   Highest education level: Not on file  Occupational History   Occupation: retired Proofreader    Comment: rehab RN  Tobacco Use   Smoking status: Never   Smokeless tobacco: Never  Vaping Use   Vaping Use: Never used  Substance and Sexual Activity   Alcohol use: Yes    Alcohol/week: 2.0 standard drinks of alcohol    Types: 2 Glasses of wine per week    Comment: rare   Drug use: No   Sexual activity: Yes    Partners: Male    Birth control/protection: Post-menopausal  Other Topics Concern   Not on file  Social History Narrative   Lives with spouse   Social Determinants of Health   Financial Resource Strain: Not  on file  Food Insecurity: Not on file  Transportation Needs: Not on file  Physical Activity: Not on file  Stress: Not on file  Social Connections: Not on file     Family History: The patient's family history includes Alzheimer's disease in her mother; Heart disease in her brother, brother, sister, and sister; Heart failure in her father; Pneumonia in her mother and sister.  ROS:   Please see the history of present illness.    All other systems reviewed and are negative.  EKGs/Labs/Other Studies Reviewed Today:     TTE 11/2019: EF 65-70%  Monitor: 14d  36% AF burden HR 49-207 bpm, avg 139.  She had two  post-conversion pauses, the longest 6.5s at 4:19 PM. - neither associated with symptoms.  EKG:  Last EKG results: ***   Recent Labs: 04/10/2022: B Natriuretic Peptide 573.7; Magnesium 1.8; TSH 3.199 04/26/2022: ALT 10; BUN 8; Creatinine 0.64; Hemoglobin 10.3; Platelet Count 243; Potassium 4.0; Sodium 140     Physical Exam:    VS:  There were no vitals taken for this visit.    Wt Readings from Last 3 Encounters:  05/25/22 126 lb (57.2 kg)  04/26/22 131 lb 8 oz (59.6 kg)  04/23/22 134 lb (60.8 kg)     GEN: *** Well nourished, well developed in no acute distress CARDIAC: ***RRR, no murmurs, rubs, gallops RESPIRATORY:  Normal work of breathing MUSCULOSKELETAL: *** edema    ASSESSMENT & PLAN:    Atrial fibrillation: paroxysmal. rates are uncontrolled        Medication Adjustments/Labs and Tests Ordered: Current medicines are reviewed at length with the patient today.  Concerns regarding medicines are outlined above.  No orders of the defined types were placed in this encounter.  No orders of the defined types were placed in this encounter.    Signed, Melida Quitter, MD  06/25/2022 4:39 PM    [place]

## 2022-06-26 ENCOUNTER — Institutional Professional Consult (permissible substitution): Payer: Medicare Other | Admitting: Cardiovascular Disease

## 2022-06-26 DIAGNOSIS — I480 Paroxysmal atrial fibrillation: Secondary | ICD-10-CM

## 2022-07-18 ENCOUNTER — Other Ambulatory Visit: Payer: Medicare Other

## 2022-07-23 ENCOUNTER — Institutional Professional Consult (permissible substitution): Payer: Medicare Other | Admitting: Internal Medicine

## 2022-07-24 ENCOUNTER — Other Ambulatory Visit: Payer: Medicare Other

## 2022-07-26 ENCOUNTER — Ambulatory Visit: Payer: Medicare Other

## 2022-07-26 DIAGNOSIS — Z8673 Personal history of transient ischemic attack (TIA), and cerebral infarction without residual deficits: Secondary | ICD-10-CM

## 2022-08-08 ENCOUNTER — Ambulatory Visit: Payer: Medicare Other | Attending: Cardiovascular Disease | Admitting: Internal Medicine

## 2022-08-08 ENCOUNTER — Encounter: Payer: Self-pay | Admitting: Internal Medicine

## 2022-08-08 VITALS — BP 117/68 | HR 44 | Ht 63.0 in | Wt 123.6 lb

## 2022-08-08 DIAGNOSIS — I4892 Unspecified atrial flutter: Secondary | ICD-10-CM | POA: Diagnosis not present

## 2022-08-08 DIAGNOSIS — Z8673 Personal history of transient ischemic attack (TIA), and cerebral infarction without residual deficits: Secondary | ICD-10-CM

## 2022-08-08 NOTE — Progress Notes (Addendum)
ELECTROPHYSIOLOGY CONSULT NOTE  Patient ID: Jeanette [name], MRN: [id number], DOB/AGE: 03-27-1935 88 y.o. Admit date: (Not on file) Date of Consult: 08/08/2022  Primary Physician: Crist Infante, MD Primary Cardiologist: MPA     Jeanette [name] is a 88 y.o. female who is being seen today for the evaluation of atrial fibrillation at the request of Dr. Maryanna [name]   HPI MERSEDES [name] is a 88 y.o. female referred for consideration of therapies related to atrial fibrillation.  History of atrial fibrillation is longstanding.  Variably symptomatic multiple medications have been used to try to control it, dronaderone was not tolerated secondary to nausea she was prescribed amiodarone but did not want it.    Modest dyspnea with exertion  She was given an event recorder AF burden 35 % w mean HR 139 in Afib but up to 207 6.5 post termination pause  Significant episodes of lightheadedness upon standing DATE TEST EF   10/20 MYOVIEW    54 % stress and rest images demonstrate a small size, mild intensity perfusion defect in basal inferoseptal myocardium with minimal reversibility   10/23 Echo 55-60         Date Cr K Hgb  10/23 0.64 4.0 10.3         Thromboembolic risk factors (age -80, HTN-1, TIA/CVA-2 , Gender-1 ) for a CHADSVASc Score of >=6    Past Medical History:  Diagnosis Date   A-fib ([place]) 03/15/2018   Anal cancer ([place])    Anxiety    Arthritis    Breast cancer ([place])    Breast cancer ([place])    Cancer of breast ([place])    Depression    Dysrhythmia 2010   a-fib   Hyperlipidemia    Hypertension    PVD (peripheral vascular disease) ([place])    RAS (renal artery stenosis) ([place])    Stroke ([place]) 2019   no residual, x 2      Surgical History:  Past Surgical History:  Procedure Laterality Date   BREAST BIOPSY Left 01/14/2003   BREAST LUMPECTOMY Right 1989   CARDIOVASCULAR STRESS TEST  07/27/2003   No evidence if LV myocardial ischemia or scar. LV EF 74%.   GYNECOLOGIC  CRYOSURGERY     IR CT HEAD LTD  12/04/2019   IR PERCUTANEOUS ART THROMBECTOMY/INFUSION INTRACRANIAL INC DIAG ANGIO  12/04/2019   IR US GUIDE VASC ACCESS LEFT  12/04/2019   OPEN REDUCTION INTERNAL FIXATION (ORIF) DISTAL RADIAL FRACTURE Left 02/19/2017   Procedure: OPEN REDUCTION INTERNAL FIXATION (ORIF) LEFT DISTAL RADIAL FRACTURE;  Surgeon: Leanora Cover, MD;  Location: [facility];  Service: Orthopedics;  Laterality: Left;   OVARIAN CYST REMOVAL     RADIOLOGY WITH ANESTHESIA N/A 12/04/2019   Procedure: IR WITH ANESTHESIA;  Surgeon: Luanne Bras, MD;  Location: [facility];  Service: Radiology;  Laterality: N/A;   RENAL DOPPLER  09/09/2012   Right proximal renal artery: 60-99% diameter reduction. Normal patency of the left main renal artery.    TRANSTHORACIC ECHOCARDIOGRAM  03/22/2006   EF 55-60%, mild mitral valvular regurg, pulmonary artery systolic pressure was moderately increased.   TUBAL LIGATION       Home Meds: Current Meds  Medication Sig   acetaminophen (TYLENOL) 650 MG CR tablet Take 650 mg by mouth 2 (two) times daily.   apixaban (ELIQUIS) 5 MG TABS tablet Take 1 tablet (5 mg total) by mouth 2 (two) times daily.   escitalopram (LEXAPRO) 10 MG tablet Take 10  mg by mouth daily.   nitroGLYCERIN (NITROSTAT) 0.4 MG SL tablet PLACE 1 TABLET (0.4 MG TOTAL) UNDER THE TONGUE EVERY 5 (FIVE) MINUTES AS NEEDED FOR CHEST PAIN.   REPATHA SURECLICK 024 MG/ML SOAJ Inject 140 mg into the skin See admin instructions. Take '140mg'$  SQ every two weeks per patient    Allergies:  Allergies  Allergen Reactions   Epinephrine Anaphylaxis    Shortness of breath and increased heart rate   Cozaar [Losartan] Other (See Comments)    increased air hunger and dizziness.   Crestor [Rosuvastatin] Other (See Comments)    memory problems.   Septra [Sulfamethoxazole-Trimethoprim] Other (See Comments)    2017 had a strange reaction she says.   Vytorin [Ezetimibe-Simvastatin] Other (See Comments)     unknown   Benazepril Hcl Cough   Crestor [Rosuvastatin Calcium] Other (See Comments)    cognitive   Lisinopril Cough    Social History   Socioeconomic History   Marital status: Married    Spouse name: Jeanette [name]   Number of children: 4   Years of education: Not on file   Highest education level: Not on file  Occupational History   Occupation: retired Proofreader    Comment: rehab RN  Tobacco Use   Smoking status: Never   Smokeless tobacco: Never  Vaping Use   Vaping Use: Never used  Substance and Sexual Activity   Alcohol use: Yes    Alcohol/week: 2.0 standard drinks of alcohol    Types: 2 Glasses of wine per week    Comment: rare   Drug use: No   Sexual activity: Yes    Partners: Male    Birth control/protection: Post-menopausal  Other Topics Concern   Not on file  Social History Narrative   Lives with spouse   Social Determinants of Health   Financial Resource Strain: Not on file  Food Insecurity: Not on file  Transportation Needs: Not on file  Physical Activity: Not on file  Stress: Not on file  Social Connections: Not on file  Intimate Partner Violence: Not on file     Family History  Problem Relation Age of Onset   Alzheimer's disease Mother    Pneumonia Mother    Heart failure Father    Heart disease Sister    Heart disease Brother    Heart disease Brother    Heart disease Sister    Pneumonia Sister      ROS:  Please see the history of present illness.     All other systems reviewed and negative.    Physical Exam:  Blood pressure 117/68, pulse (!) 44, height '5\' 3"'$  (1.6 m), weight 123 lb 9.6 oz (56.1 kg), SpO2 98 %. General: Well developed, well nourished female in no acute distress. Head: Normocephalic, atraumatic, sclera non-icteric, no xanthomas, nares are without discharge. EENT: normal  Lymph Nodes:  none Neck: Negative for carotid bruits. JVD not elevated. Back:without scoliosis kyphosis Lungs: Clear bilaterally to auscultation without  wheezes, rales, or rhonchi. Breathing is unlabored. Heart: RRR with S1 S2. No murmur . No rubs, or gallops appreciated. Abdomen: Soft, non-tender, non-distended with normoactive bowel sounds. No hepatomegaly. No rebound/guarding. No obvious abdominal masses. Msk:  Strength and tone appear normal for age. Extremities: No clubbing or cyanosis. No edema.  Distal pedal pulses are 2+ and equal bilaterally. Skin: Warm and Dry Neuro: Alert and oriented X 3. CN III-XII intact Grossly normal sensory and motor function . Psych:  Responds to questions appropriately with a normal  affect.        EKG: sinus @ 52 with competing atrial ectopy Intervals 14/08/44   Assessment and Plan:  Orthostatic hypotension  Falls with syncope  Atrial fibrillation/atrial tachycardia with rapid ventricular response  Posttermination pauses greater than 6 seconds  Hypertension  Ventricular tachycardia-nonsustained  UTI/kidney infection ongoing   The patient has posttermination pauses of greater than 6 seconds.  And prior to being more aggressive with control of her tachycardia, I think prevention of further bradycardia arrhythmias would be the high priority, this will require pacing. The benefits and risks were reviewed including but not limited to death,  perforation, infection, lead dislodgement and device malfunction.  The patient understands agrees and is willing to proceed.  We will wait to hear from urology however in the context of ongoing possibly kidney infection prior to implanting the device; she has had no syncope so hopefully we will get in soon enough  Following pacing, we can use the data from the device to determine ventricular response and antiarrhythmic therapies can be considered.  She is averse to amiodarone but we did discuss the potential role.  She has no known coronary disease and she could also be treated potentially with a 1C  Orthostatic hypotension is profound.  We discussed the  physiology.  Suggested compression as opposed to pharmacological therapy to start.  Compression of thighs and/or abdomen.  Moreover, suggested she raise the head of her bed 4 inches as her a.m. arising has been her most treacherous time.  Not withstanding her supine hypertension, would not treat it given the profound greater than 60 mm drop.  Interestingly, there was no change in heart rate which would suggest autonomic failure.  She has some degree of dry mouth, but does not have constipation and/or dry eyes.  Has a history of anal cancer and intermittent diarrhea but I would think that she will still have constipation if this were Shy-Drager syndrome however, the lack of heart rate response does make the concern.      Virl Axe

## 2022-08-08 NOTE — Patient Instructions (Signed)
Medication Instructions:  Your physician recommends that you continue on your current medications as directed. Please refer to the Current Medication list given to you today.  *If you need a refill on your cardiac medications before your next appointment, please call your pharmacy*   Lab Work: None ordered.   If you have labs (blood work) drawn today and your tests are completely normal, you will receive your results only by: [place] (if you have MyChart) OR A paper copy in the mail If you have any lab test that is abnormal or we need to change your treatment, we will call you to review the results.   Testing/Procedures: Please contact us once your provider has determined your UTI has resolved and we can safely move forward with scheduling pacemaker implant. Your physician has recommended that you have a pacemaker inserted. A pacemaker is a small device that is placed under the skin of your chest or abdomen to help control abnormal heart rhythms. This device uses electrical pulses to prompt the heart to beat at a normal rate. Pacemakers are used to treat heart rhythms that are too slow. Wire (leads) are attached to the pacemaker that goes into the chambers of you heart. This is done in the hospital and usually requires and overnight stay. Please see the instruction sheet given to you today for more information.    Follow-Up: At [facility], you and your health needs are our priority.  As part of our continuing mission to provide you with exceptional heart care, we have created designated Provider Care Teams.  These Care Teams include your primary Cardiologist (physician) and Advanced Practice Providers (APPs -  Physician Assistants and Nurse Practitioners) who all work together to provide you with the care you need, when you need it.  We recommend signing up for the patient portal called "MyChart".  Sign up information is provided on this After Visit Summary.  MyChart is  used to connect with patients for Virtual Visits (Telemedicine).  Patients are able to view lab/test results, encounter notes, upcoming appointments, etc.  Non-urgent messages can be sent to your provider as well.   To learn more about what you can do with MyChart, go to [email or web address].    Your next appointment:   To be determined  Abdominal and Thigh compression as discussed by Dr Caryl Comes.

## 2022-08-16 ENCOUNTER — Emergency Department (HOSPITAL_COMMUNITY): Payer: Medicare Other

## 2022-08-16 ENCOUNTER — Other Ambulatory Visit: Payer: Self-pay

## 2022-08-16 ENCOUNTER — Inpatient Hospital Stay (HOSPITAL_COMMUNITY)
Admission: EM | Admit: 2022-08-16 | Discharge: 2022-08-19 | DRG: 917 | Disposition: A | Discharge status: Home Health | Payer: Medicare Other | Attending: Internal Medicine | Admitting: Internal Medicine

## 2022-08-16 ENCOUNTER — Encounter (HOSPITAL_COMMUNITY): Payer: Self-pay

## 2022-08-16 DIAGNOSIS — G928 Other toxic encephalopathy: Secondary | ICD-10-CM | POA: Diagnosis present

## 2022-08-16 DIAGNOSIS — E785 Hyperlipidemia, unspecified: Secondary | ICD-10-CM | POA: Diagnosis present

## 2022-08-16 DIAGNOSIS — Z7901 Long term (current) use of anticoagulants: Secondary | ICD-10-CM

## 2022-08-16 DIAGNOSIS — G9341 Metabolic encephalopathy: Secondary | ICD-10-CM | POA: Diagnosis present

## 2022-08-16 DIAGNOSIS — I951 Orthostatic hypotension: Secondary | ICD-10-CM | POA: Diagnosis present

## 2022-08-16 DIAGNOSIS — R4182 Altered mental status, unspecified: Secondary | ICD-10-CM | POA: Diagnosis not present

## 2022-08-16 DIAGNOSIS — Z79899 Other long term (current) drug therapy: Secondary | ICD-10-CM

## 2022-08-16 DIAGNOSIS — I739 Peripheral vascular disease, unspecified: Secondary | ICD-10-CM | POA: Diagnosis present

## 2022-08-16 DIAGNOSIS — N390 Urinary tract infection, site not specified: Secondary | ICD-10-CM | POA: Diagnosis present

## 2022-08-16 DIAGNOSIS — G9340 Encephalopathy, unspecified: Secondary | ICD-10-CM | POA: Diagnosis present

## 2022-08-16 DIAGNOSIS — Z8673 Personal history of transient ischemic attack (TIA), and cerebral infarction without residual deficits: Secondary | ICD-10-CM

## 2022-08-16 DIAGNOSIS — Z8249 Family history of ischemic heart disease and other diseases of the circulatory system: Secondary | ICD-10-CM

## 2022-08-16 DIAGNOSIS — T481X1A Poisoning by skeletal muscle relaxants [neuromuscular blocking agents], accidental (unintentional), initial encounter: Principal | ICD-10-CM | POA: Diagnosis present

## 2022-08-16 DIAGNOSIS — Z853 Personal history of malignant neoplasm of breast: Secondary | ICD-10-CM

## 2022-08-16 DIAGNOSIS — Z1152 Encounter for screening for COVID-19: Secondary | ICD-10-CM

## 2022-08-16 DIAGNOSIS — F32A Depression, unspecified: Secondary | ICD-10-CM | POA: Diagnosis present

## 2022-08-16 DIAGNOSIS — Z882 Allergy status to sulfonamides status: Secondary | ICD-10-CM

## 2022-08-16 DIAGNOSIS — F0393 Unspecified dementia, unspecified severity, with mood disturbance: Secondary | ICD-10-CM | POA: Diagnosis present

## 2022-08-16 DIAGNOSIS — R410 Disorientation, unspecified: Principal | ICD-10-CM

## 2022-08-16 DIAGNOSIS — I480 Paroxysmal atrial fibrillation: Secondary | ICD-10-CM | POA: Diagnosis present

## 2022-08-16 DIAGNOSIS — I10 Essential (primary) hypertension: Secondary | ICD-10-CM | POA: Diagnosis present

## 2022-08-16 DIAGNOSIS — I4892 Unspecified atrial flutter: Secondary | ICD-10-CM | POA: Diagnosis present

## 2022-08-16 DIAGNOSIS — I639 Cerebral infarction, unspecified: Secondary | ICD-10-CM | POA: Diagnosis present

## 2022-08-16 DIAGNOSIS — D649 Anemia, unspecified: Secondary | ICD-10-CM | POA: Diagnosis present

## 2022-08-16 DIAGNOSIS — Z66 Do not resuscitate: Secondary | ICD-10-CM | POA: Diagnosis present

## 2022-08-16 DIAGNOSIS — Z888 Allergy status to other drugs, medicaments and biological substances status: Secondary | ICD-10-CM

## 2022-08-16 DIAGNOSIS — Z85048 Personal history of other malignant neoplasm of rectum, rectosigmoid junction, and anus: Secondary | ICD-10-CM

## 2022-08-16 DIAGNOSIS — Z820 Family history of epilepsy and other diseases of the nervous system: Secondary | ICD-10-CM

## 2022-08-16 DIAGNOSIS — Z87440 Personal history of urinary (tract) infections: Secondary | ICD-10-CM

## 2022-08-16 DIAGNOSIS — M1990 Unspecified osteoarthritis, unspecified site: Secondary | ICD-10-CM | POA: Diagnosis present

## 2022-08-16 DIAGNOSIS — E876 Hypokalemia: Secondary | ICD-10-CM | POA: Diagnosis present

## 2022-08-16 DIAGNOSIS — R001 Bradycardia, unspecified: Secondary | ICD-10-CM | POA: Diagnosis present

## 2022-08-16 HISTORY — DX: Encephalopathy, unspecified: G93.40

## 2022-08-16 LAB — COMPREHENSIVE METABOLIC PANEL
ALT: 6 U/L (ref 0–44)
AST: 19 U/L (ref 15–41)
Albumin: 3.7 g/dL (ref 3.5–5.0)
Alkaline Phosphatase: 46 U/L (ref 38–126)
Anion gap: 8 (ref 5–15)
BUN: 12 mg/dL (ref 8–23)
CO2: 30 mmol/L (ref 22–32)
Calcium: 11.3 mg/dL — ABNORMAL HIGH (ref 8.9–10.3)
Chloride: 100 mmol/L (ref 98–111)
Creatinine, Ser: 0.78 mg/dL (ref 0.44–1.00)
GFR, Estimated: >60 mL/min (ref >60)
Glucose, Bld: 110 mg/dL — ABNORMAL HIGH (ref 70–99)
Potassium: 3.7 mmol/L (ref 3.5–5.1)
Sodium: 138 mmol/L (ref 135–145)
Total Bilirubin: 1 mg/dL (ref 0.3–1.2)
Total Protein: 7 g/dL (ref 6.5–8.1)

## 2022-08-16 LAB — URINALYSIS, W/ REFLEX TO CULTURE (INFECTION SUSPECTED)
Bacteria, UA: NONE SEEN
Bilirubin Urine: NEGATIVE
Glucose, UA: NEGATIVE mg/dL
Ketones, ur: 20 mg/dL — AB
Leukocytes,Ua: NEGATIVE
Nitrite: NEGATIVE
Protein, ur: NEGATIVE mg/dL
Specific Gravity, Urine: 1.017 (ref 1.005–1.030)
pH: 6 (ref 5.0–8.0)

## 2022-08-16 LAB — RAPID URINE DRUG SCREEN, HOSP PERFORMED
Amphetamines: NOT DETECTED
Barbiturates: NOT DETECTED
Benzodiazepines: NOT DETECTED
Cocaine: NOT DETECTED
Opiates: POSITIVE — AB
Tetrahydrocannabinol: NOT DETECTED

## 2022-08-16 LAB — CBC WITH DIFFERENTIAL/PLATELET
Abs Immature Granulocytes: 0.05 10*3/uL (ref 0.00–0.07)
Basophils Absolute: 0 10*3/uL (ref 0.0–0.1)
Basophils Relative: 0 %
Eosinophils Absolute: 0 10*3/uL (ref 0.0–0.5)
Eosinophils Relative: 0 %
HCT: 36.4 % (ref 36.0–46.0)
Hemoglobin: 11.7 g/dL — ABNORMAL LOW (ref 12.0–15.0)
Immature Granulocytes: 1 %
Lymphocytes Relative: 20 %
Lymphs Abs: 1.4 10*3/uL (ref 0.7–4.0)
MCH: 31.5 pg (ref 26.0–34.0)
MCHC: 32.1 g/dL (ref 30.0–36.0)
MCV: 98.1 fL (ref 80.0–100.0)
Monocytes Absolute: 1.4 10*3/uL — ABNORMAL HIGH (ref 0.1–1.0)
Monocytes Relative: 20 %
Neutro Abs: 4.2 10*3/uL (ref 1.7–7.7)
Neutrophils Relative %: 59 %
Platelets: 230 10*3/uL (ref 150–400)
RBC: 3.71 MIL/uL — ABNORMAL LOW (ref 3.87–5.11)
RDW: 13.6 % (ref 11.5–15.5)
WBC: 7.1 10*3/uL (ref 4.0–10.5)
nRBC: 0 % (ref 0.0–0.2)

## 2022-08-16 LAB — ACETAMINOPHEN LEVEL: Acetaminophen (Tylenol), Serum: <10 ug/mL — ABNORMAL LOW (ref 10–30)

## 2022-08-16 LAB — VITAMIN B12: Vitamin B-12: 255 pg/mL (ref 180–914)

## 2022-08-16 LAB — ETHANOL: Alcohol, Ethyl (B): <10 mg/dL (ref <10)

## 2022-08-16 LAB — MAGNESIUM: Magnesium: 2 mg/dL (ref 1.7–2.4)

## 2022-08-16 LAB — TSH: TSH: 2.024 u[IU]/mL (ref 0.350–4.500)

## 2022-08-16 LAB — SALICYLATE LEVEL: Salicylate Lvl: <7 mg/dL — ABNORMAL LOW (ref 7.0–30.0)

## 2022-08-16 LAB — AMMONIA: Ammonia: 19 umol/L (ref 9–35)

## 2022-08-16 MED ORDER — ACETAMINOPHEN 650 MG RE SUPP: 650.0000 mg | Freq: Four times a day (QID) | RECTAL | Status: DC | PRN

## 2022-08-16 MED ORDER — HYDRALAZINE HCL 20 MG/ML IJ SOLN
10.0000 mg | INTRAMUSCULAR | Status: DC | PRN
  Administered 2022-08-17 – 2022-08-18 (×2): 10 mg via INTRAVENOUS
  Filled 2022-08-16 (×2): qty 1

## 2022-08-16 MED ORDER — ACETAMINOPHEN 325 MG PO TABS
650.0000 mg | ORAL_TABLET | Freq: Four times a day (QID) | ORAL | Status: DC | PRN
  Administered 2022-08-18: 650 mg via ORAL
  Filled 2022-08-16 (×2): qty 2

## 2022-08-16 MED ORDER — SODIUM CHLORIDE 0.9 % IV SOLN: INTRAVENOUS | Status: AC

## 2022-08-16 NOTE — ED Triage Notes (Signed)
Pt bib husband from home; AMS, increased confusion x 2 days; pt agitated in triage, unwilling to hold still for vitals; hx UTI, is currently being treated for one; husband endorses pt possibly taking home meds incorrectly; normally a and o x 4; husband states plan for pacemaker after UTI resolved; pt states pt began shaking Tuesday night; denies fevers

## 2022-08-16 NOTE — ED Provider Notes (Signed)
[facility] Provider Note   CSN: [id number] Arrival date & time: 08/16/22  1534     History  Chief Complaint  Patient presents with   Altered Mental Status    Jeanette [name] is a 88 y.o. female with Afib, h/o breast cancer, HLD. HTN, anal cancer, PVD, h/o CVA without residual deficits, who presents with AMS.   History obtained from husband at bedside. Reports increased confusion x 2 days. States she is currently being treated for a UTI, unsure what antibiotic.  States that patient also began shaking on Tuesday night as if she were very cold.  Usband endorses pt possibly taking home meds incorrectly; normally a and o x 4 and functional, manages her own meds, but currently is confused, A&Ox1. Husband states plan for pacemaker after UTI resolved.  Denies any fevers, cough, chest pain, shortness of breath, abdominal pain, nausea vomiting diarrhea constipation, recent falls, lower extremity edema.  Patient can state her name but cannot answer any other questions.   Altered Mental Status      Home Medications Prior to Admission medications   Medication Sig Start Date End Date Taking? Authorizing Provider  acetaminophen (TYLENOL) 650 MG CR tablet Take 650 mg by mouth 2 (two) times daily.    [provider]  apixaban (ELIQUIS) 5 MG TABS tablet Take 1 tablet (5 mg total) by mouth 2 (two) times daily. 12/06/19   Rinehuls, David L, PA-C  escitalopram (LEXAPRO) 10 MG tablet Take 10 mg by mouth daily.    [provider]  metoprolol (TOPROL-XL) 200 MG 24 hr tablet Take 100 mg by mouth 2 (two) times daily.    [provider]  nitroGLYCERIN (NITROSTAT) 0.4 MG SL tablet PLACE 1 TABLET (0.4 MG TOTAL) UNDER THE TONGUE EVERY 5 (FIVE) MINUTES AS NEEDED FOR CHEST PAIN. 07/13/19 08/08/22  Patwardhan, Reynold Bowen, MD  olmesartan (BENICAR) 20 MG tablet Take 10 mg by mouth daily.    [provider]  REPATHA SURECLICK 834 MG/ML SOAJ  Inject 140 mg into the skin See admin instructions. Take '140mg'$  SQ every two weeks per patient 10/12/19   [provider]      Allergies    Epinephrine, Cozaar [losartan], Crestor [rosuvastatin], Septra [sulfamethoxazole-trimethoprim], Vytorin [ezetimibe-simvastatin], Benazepril hcl, Crestor [rosuvastatin calcium], and Lisinopril    Review of Systems   Review of Systems  Unable to perform ROS: Mental status change   Physical Exam Updated Vital Signs BP 91/74 (BP Location: Right Arm)   Pulse 87   Temp 98.3 F (36.8 C)   Resp (!) 26   SpO2 100%  Physical Exam General: Normal appearing female, lying in bed.  HEENT: PERRLA 54m bilaterally, EOMI, Sclera anicteric, dry mucous membranes, trachea midline.  Cardiology: RRR, no murmurs/rubs/gallops. BL radial and DP pulses equal bilaterally.  Resp: Normal respiratory rate and effort. CTAB, no wheezes, rhonchi, crackles.  Abd: Soft, non-tender, non-distended. No rebound tenderness or guarding.  GU: Deferred. MSK: No peripheral edema or signs of trauma. Extremities without deformity or TTP. No cyanosis or clubbing. Skin: warm, dry. No rashes or lesions. Back: No CVA tenderness Neuro: A&Ox1, CNs II-XII grossly intact. MAEs. Sensation grossly intact.  Cannot follow commands. Psych: Very confused, rambling speech.  Intermittently answering some questions but becomes tangential and confused.  Wearing mittens and trying to get them off, pulling at cords/blankets.  ED Results / Procedures / Treatments   Labs (all labs ordered are listed, but only abnormal results are displayed)  Labs Reviewed  CBC WITH DIFFERENTIAL/PLATELET - Abnormal; Notable for the following components:      Result Value   RBC 3.71 (*)    Hemoglobin 11.7 (*)    Monocytes Absolute 1.4 (*)    All other components within normal limits  COMPREHENSIVE METABOLIC PANEL - Abnormal; Notable for the following components:   Glucose, Bld 110 (*)    Calcium 11.3 (*)    All other  components within normal limits  SALICYLATE LEVEL - Abnormal; Notable for the following components:   Salicylate Lvl <0.0 (*)    All other components within normal limits  ACETAMINOPHEN LEVEL - Abnormal; Notable for the following components:   Acetaminophen (Tylenol), Serum <10 (*)    All other components within normal limits  MAGNESIUM  ETHANOL  URINALYSIS, W/ REFLEX TO CULTURE (INFECTION SUSPECTED)  RAPID URINE DRUG SCREEN, HOSP PERFORMED  COMPREHENSIVE METABOLIC PANEL  CBC WITH DIFFERENTIAL/PLATELET  TSH  AMMONIA  URINALYSIS, ROUTINE W REFLEX MICROSCOPIC  VITAMIN B12  CBG MONITORING, ED    EKG None  Radiology CT Head Wo Contrast  Result Date: 08/16/2022 CLINICAL DATA:  Increasing confusion for 2 days, altered level of consciousness EXAM: CT HEAD WITHOUT CONTRAST TECHNIQUE: Contiguous axial images were obtained from the base of the skull through the vertex without intravenous contrast. RADIATION DOSE REDUCTION: This exam was performed according to the departmental dose-optimization program which includes automated exposure control, adjustment of the mA and/or kV according to patient size and/or use of iterative reconstruction technique. COMPARISON:  12/05/2019 FINDINGS: Brain: No acute infarct or hemorrhage. Stable left frontotemporal encephalomalacia related to prior infarct. Lateral ventricles and midline structures are stable. No acute extra-axial fluid collections. No mass effect. Vascular: No hyperdense vessel or unexpected calcification. Skull: Normal. Negative for fracture or focal lesion. Sinuses/Orbits: No acute finding. Other: None. IMPRESSION: 1. No acute intracranial process. Electronically Signed   By: Randa Ngo M.D.   On: 08/16/2022 19:17    Procedures Procedures    Medications Ordered in ED Medications  acetaminophen (TYLENOL) tablet 650 mg (has no administration in time range)    Or  acetaminophen (TYLENOL) suppository 650 mg (has no administration in time  range)  hydrALAZINE (APRESOLINE) injection 10 mg (has no administration in time range)  0.9 %  sodium chloride infusion (has no administration in time range)    ED Course/ Medical Decision Making/ A&P                          Medical Decision Making Amount and/or Complexity of Data Reviewed Labs: ordered. Decision-making details documented in ED Course. Radiology: ordered. Decision-making details documented in ED Course.  Risk Decision regarding hospitalization.    This patient presents to the ED for concern of AMS, this involves an extensive number of treatment options, and is a complaint that carries with it a high risk of complications and morbidity.  I considered the following differential and admission for this acute, potentially life threatening condition.   MDM:    Ddx of acute altered mental status or encephalopathy considered but not limited to: -Intracranial abnormalities such as ICH, hydrocephalus, head trauma - no known trauma/falls, MAEs and no focal neuro deficits, will obtain CTH -Infection such as UTI, PNA - patient reportedly being treated currently for UTI but unsure which antibiotic. No report of coughing/flu-like symptoms but had covid and will test.  -Toxic ingestion such as opioid overdose, anticholinergic toxicity - husband reports possibly taking her medications incorrectly,  but cannot state which ones or how much. -Electrolyte abnormalities or hyper/hypoglycemia -Hypercarbia or hypoxia - patient not hypoxic or SOB, and fully awake and alert, low c/f hypercarbia -Hepatic encephalopathy or uremia -ACS or arrhythmia   Clinical Course as of 08/16/22 2154  Thu Aug 16, 2022  1634 Hemoglobin(!): 11.7 [HN]  1635 WBC: 7.1 [HN]  1920 CT Head Wo Contrast 1. No acute intracranial process. [HN]  2153 Alcohol, Ethyl (B): <18 [HN]  2993 Salicylate Lvl(!): <7.1 [HN]  2153 Acetaminophen (Tylenol), S(!): <10 [HN]  2154 Magnesium: 2.0 [HN]  2154 Patient with reassuring  w/u so far, pending urine. She has no focal neuro deficits, reassuring against CVA. Consider ongoing UTI vs polypharmacy. Patient will be admitted to medicine for further AMS w/u.  [HN]    Clinical Course User Index [HN] Audley Hose, MD    Labs: I Ordered, and personally interpreted labs.  The pertinent results include:  those listed above  Imaging Studies ordered: I ordered imaging studies including CTH I independently visualized and interpreted imaging. I agree with the radiologist interpretation  Additional history obtained from husband, chart review.    Cardiac Monitoring: The patient was maintained on a cardiac monitor.  I personally viewed and interpreted the cardiac monitored which showed an underlying rhythm of: NSR  Reevaluation: After the interventions noted above, I reevaluated the patient and found that they have :stayed the same  Social Determinants of Health: Patient lives independently w/ husband  Disposition: admit to medicine   Co morbidities that complicate the patient evaluation  Past Medical History:  Diagnosis Date   A-fib ([place]) 03/15/2018   Anal cancer ([place])    Anxiety    Arthritis    Breast cancer ([place])    Breast cancer ([place])    Cancer of breast ([place])    Depression    Dysrhythmia 2010   a-fib   Hyperlipidemia    Hypertension    PVD (peripheral vascular disease) ([place])    RAS (renal artery stenosis) ([place])    Stroke ([place]) 2019   no residual, x 2     Medicines Meds ordered this encounter  Medications   OR Linked Order Group    acetaminophen (TYLENOL) tablet 650 mg    acetaminophen (TYLENOL) suppository 650 mg   hydrALAZINE (APRESOLINE) injection 10 mg   0.9 %  sodium chloride infusion    I have reviewed the patients home medicines and have made adjustments as needed  Problem List / ED Course: Problem List Items Addressed This Visit   None Visit Diagnoses     Confusion    -  Primary                   This note was  created using dictation software, which may contain spelling or grammatical errors.    Audley Hose, MD 08/16/22 2155

## 2022-08-16 NOTE — Progress Notes (Signed)
ANTICOAGULATION CONSULT NOTE - Initial Consult  Pharmacy Consult for Lovenox Indication: atrial fibrillation  Allergies  Allergen Reactions   Epinephrine Anaphylaxis    Shortness of breath and increased heart rate   Cozaar [Losartan] Other (See Comments)    increased air hunger and dizziness.   Crestor [Rosuvastatin] Other (See Comments)    memory problems.   Septra [Sulfamethoxazole-Trimethoprim] Other (See Comments)    2017 had a strange reaction she says.   Vytorin [Ezetimibe-Simvastatin] Other (See Comments)    unknown   Benazepril Hcl Cough   Crestor [Rosuvastatin Calcium] Other (See Comments)    cognitive   Lisinopril Cough    Patient Measurements:    Vital Signs: Temp: 97.9 F (36.6 C) (02/08 2308) BP: 123/66 (02/08 2311) Pulse Rate: 84 (02/08 2311)  Labs: Recent Labs    08/16/22 1538  HGB 11.7*  HCT 36.4  PLT 230  CREATININE 0.78    Estimated Creatinine Clearance: 42.5 mL/min (by C-G formula based on SCr of 0.78 mg/dL).   Medical History: Past Medical History:  Diagnosis Date   A-fib ([place]) 03/15/2018   Anal cancer ([place])    Anxiety    Arthritis    Breast cancer ([place])    Breast cancer ([place])    Cancer of breast ([place])    Depression    Dysrhythmia 2010   a-fib   Hyperlipidemia    Hypertension    PVD (peripheral vascular disease) ([place])    RAS (renal artery stenosis) ([place])    Stroke ([place]) 2019   no residual, x 2    Medications:  No current facility-administered medications on file prior to encounter.   Current Outpatient Medications on File Prior to Encounter  Medication Sig Dispense Refill   acetaminophen (TYLENOL) 650 MG CR tablet Take 650 mg by mouth 2 (two) times daily.     apixaban (ELIQUIS) 5 MG TABS tablet Take 1 tablet (5 mg total) by mouth 2 (two) times daily. 60 tablet 1   escitalopram (LEXAPRO) 10 MG tablet Take 10 mg by mouth daily.     metoprolol (TOPROL-XL) 200 MG 24 hr tablet Take 100 mg by mouth 2 (two) times daily.      nitroGLYCERIN (NITROSTAT) 0.4 MG SL tablet PLACE 1 TABLET (0.4 MG TOTAL) UNDER THE TONGUE EVERY 5 (FIVE) MINUTES AS NEEDED FOR CHEST PAIN. 75 tablet 1   olmesartan (BENICAR) 20 MG tablet Take 10 mg by mouth daily.     REPATHA SURECLICK 742 MG/ML SOAJ Inject 140 mg into the skin See admin instructions. Take '140mg'$  SQ every two weeks per patient       Assessment: 88 y.o. female admitted with AMS, h/o Afib and Eliquis on hold, for Lovenox  Goal of Therapy:  Monitor platelets by anticoagulation protocol: Yes   Plan:  Lovenox 60 mg SQ q12h  Caryl Pina 08/16/2022,11:52 PM

## 2022-08-16 NOTE — H&P (Signed)
History and Physical    Jeanette [name] [id number] DOB: 01-20-1935 DOA: 08/16/2022  Patient coming from: Home.  History obtained from patient's husband.  Patient has confusion.  Chief Complaint: Increasing confusion.  HPI: Jeanette [name] is a 88 y.o. female with history of prior stroke requiring thrombectomy, paroxysmal atrial fibrillation being evaluated by EP cardiology for possible pacemaker placement, recurrent UTI being followed by urologist, prior history of anal cancer breast cancer, chronic anemia, hypertension was brought to the ER after patient became increasingly confused over the last 3 days.  Patient's husband who provided the history states that her symptoms started about 3 days ago with confusion which was intermittent.  It improved this morning and patient was taken to her primary care physician for follow-up and patient became more confused and was referred to the ER.  Patient's husband feels that patient may have inadvertently taken more of her regular home medications including gabapentin and Flexeril.  Patient does not drink alcohol.  Recently was treated for UTI antibiotic name of which patient's husband is not aware.  ED Course: In the ER patient is only oriented to her name.  Patient was hallucinating.  Moving all extremities trying to pull things out.  CT head unremarkable.  UA is pending.  Labs show mild anemia.  Patient is afebrile.  No obvious signs of any infection at this time.  Patient admitted for acute encephalopathy caused today.  Review of Systems: As per HPI, rest all negative.   Past Medical History:  Diagnosis Date   A-fib ([place]) 03/15/2018   Anal cancer ([place])    Anxiety    Arthritis    Breast cancer ([place])    Breast cancer ([place])    Cancer of breast ([place])    Depression    Dysrhythmia 2010   a-fib   Hyperlipidemia    Hypertension    PVD (peripheral vascular disease) ([place])    RAS (renal artery stenosis) ([place])    Stroke ([place]) 2019   no residual, x 2     Past Surgical History:  Procedure Laterality Date   BREAST BIOPSY Left 01/14/2003   BREAST LUMPECTOMY Right 1989   CARDIOVASCULAR STRESS TEST  07/27/2003   No evidence if LV myocardial ischemia or scar. LV EF 74%.   GYNECOLOGIC CRYOSURGERY     IR CT HEAD LTD  12/04/2019   IR PERCUTANEOUS ART THROMBECTOMY/INFUSION INTRACRANIAL INC DIAG ANGIO  12/04/2019   IR US GUIDE VASC ACCESS LEFT  12/04/2019   OPEN REDUCTION INTERNAL FIXATION (ORIF) DISTAL RADIAL FRACTURE Left 02/19/2017   Procedure: OPEN REDUCTION INTERNAL FIXATION (ORIF) LEFT DISTAL RADIAL FRACTURE;  Surgeon: Leanora Cover, MD;  Location: [facility];  Service: Orthopedics;  Laterality: Left;   OVARIAN CYST REMOVAL     RADIOLOGY WITH ANESTHESIA N/A 12/04/2019   Procedure: IR WITH ANESTHESIA;  Surgeon: Luanne Bras, MD;  Location: [facility];  Service: Radiology;  Laterality: N/A;   RENAL DOPPLER  09/09/2012   Right proximal renal artery: 60-99% diameter reduction. Normal patency of the left main renal artery.    TRANSTHORACIC ECHOCARDIOGRAM  03/22/2006   EF 55-60%, mild mitral valvular regurg, pulmonary artery systolic pressure was moderately increased.   TUBAL LIGATION       reports that she has never smoked. She has never used smokeless tobacco. She reports current alcohol use of about 2.0 standard drinks of alcohol per week. She reports that she does not use drugs.  Allergies  Allergen Reactions   Epinephrine Anaphylaxis  Shortness of breath and increased heart rate   Cozaar [Losartan] Other (See Comments)    increased air hunger and dizziness.   Crestor [Rosuvastatin] Other (See Comments)    memory problems.   Septra [Sulfamethoxazole-Trimethoprim] Other (See Comments)    2017 had a strange reaction she says.   Vytorin [Ezetimibe-Simvastatin] Other (See Comments)    unknown   Benazepril Hcl Cough   Crestor [Rosuvastatin Calcium] Other (See Comments)    cognitive   Lisinopril Cough    Family History   Problem Relation Age of Onset   Alzheimer's disease Mother    Pneumonia Mother    Heart failure Father    Heart disease Sister    Heart disease Brother    Heart disease Brother    Heart disease Sister    Pneumonia Sister     Prior to Admission medications   Medication Sig Start Date End Date Taking? Authorizing Provider  acetaminophen (TYLENOL) 650 MG CR tablet Take 650 mg by mouth 2 (two) times daily.    [provider]  apixaban (ELIQUIS) 5 MG TABS tablet Take 1 tablet (5 mg total) by mouth 2 (two) times daily. 12/06/19   Rinehuls, David L, PA-C  escitalopram (LEXAPRO) 10 MG tablet Take 10 mg by mouth daily.    [provider]  metoprolol (TOPROL-XL) 200 MG 24 hr tablet Take 100 mg by mouth 2 (two) times daily.    [provider]  nitroGLYCERIN (NITROSTAT) 0.4 MG SL tablet PLACE 1 TABLET (0.4 MG TOTAL) UNDER THE TONGUE EVERY 5 (FIVE) MINUTES AS NEEDED FOR CHEST PAIN. 07/13/19 08/08/22  Patwardhan, Reynold Bowen, MD  olmesartan (BENICAR) 20 MG tablet Take 10 mg by mouth daily.    [provider]  REPATHA SURECLICK [id number] MG/ML SOAJ Inject 140 mg into the skin See admin instructions. Take 157m SQ every two weeks per patient 10/12/19   [provider]    Physical Exam: Constitutional: Moderately built and nourished. Vitals:   08/16/22 1559 08/16/22 1603 08/16/22 2033  BP:  91/74   Pulse: 87    Resp: (!) 26    Temp:   98.3 F (36.8 C)  SpO2: 100%     Eyes: Anicteric no pallor. ENMT: No discharge from the ears eyes nose and mouth. Neck: No mass felt.  No neck rigidity. Respiratory: No rhonchi or crepitations. Cardiovascular: S1-S2 heard. Abdomen: Soft nontender bowel sound present. Musculoskeletal: No edema. Skin: No rash. Neurologic: Alert awake oriented to her name.  Moving all extremities but not following commands.  Pupils are equal reacting to light. Psychiatric: Appears confused.   Labs on Admission: I have personally reviewed  following labs and imaging studies  CBC: Recent Labs  Lab 08/16/22 1538  WBC 7.1  NEUTROABS 4.2  HGB 11.7*  HCT 36.4  MCV 98.1  PLT [id number]  Basic Metabolic Panel: Recent Labs  Lab 08/16/22 1538  NA 138  K 3.7  CL 100  CO2 30  GLUCOSE 110*  BUN 12  CREATININE 0.78  CALCIUM 11.3*  MG 2.0   GFR: Estimated Creatinine Clearance: 42.5 mL/min (by C-G formula based on SCr of 0.78 mg/dL). Liver Function Tests: Recent Labs  Lab 08/16/22 1538  AST 19  ALT 6  ALKPHOS 46  BILITOT 1.0  PROT 7.0  ALBUMIN 3.7   No results for input(s): "LIPASE", "AMYLASE" in the last 168 hours. No results for input(s): "AMMONIA" in the last 168 hours. Coagulation Profile: No results for input(s): "INR", "PROTIME" in the  last 168 hours. Cardiac Enzymes: No results for input(s): "CKTOTAL", "CKMB", "CKMBINDEX", "TROPONINI" in the last 168 hours. BNP (last 3 results) No results for input(s): "PROBNP" in the last 8760 hours. HbA1C: No results for input(s): "HGBA1C" in the last 72 hours. CBG: No results for input(s): "GLUCAP" in the last 168 hours. Lipid Profile: No results for input(s): "CHOL", "HDL", "LDLCALC", "TRIG", "CHOLHDL", "LDLDIRECT" in the last 72 hours. Thyroid Function Tests: No results for input(s): "TSH", "T4TOTAL", "FREET4", "T3FREE", "THYROIDAB" in the last 72 hours. Anemia Panel: No results for input(s): "VITAMINB12", "FOLATE", "FERRITIN", "TIBC", "IRON", "RETICCTPCT" in the last 72 hours. Urine analysis:    Component Value Date/Time   COLORURINE YELLOW 04/28/2022 1325   APPEARANCEUR HAZY (A) 04/28/2022 1325   LABSPEC 1.015 04/28/2022 1325   PHURINE 6.0 04/28/2022 1325   GLUCOSEU NEGATIVE 04/28/2022 1325   HGBUR TRACE (A) 04/28/2022 1325   BILIRUBINUR NEGATIVE 04/28/2022 1325   KETONESUR NEGATIVE 04/28/2022 1325   PROTEINUR NEGATIVE 04/28/2022 1325   UROBILINOGEN 0.2 03/14/2009 1400   NITRITE POSITIVE (A) 04/28/2022 1325   LEUKOCYTESUR LARGE (A) 04/28/2022 1325    Sepsis Labs: @LABRCNTIP$ (procalcitonin:4,lacticidven:4) )No results found for this or any previous visit (from the past 240 hour(s)).   Radiological Exams on Admission: CT Head Wo Contrast  Result Date: 08/16/2022 CLINICAL DATA:  Increasing confusion for 2 days, altered level of consciousness EXAM: CT HEAD WITHOUT CONTRAST TECHNIQUE: Contiguous axial images were obtained from the base of the skull through the vertex without intravenous contrast. RADIATION DOSE REDUCTION: This exam was performed according to the departmental dose-optimization program which includes automated exposure control, adjustment of the mA and/or kV according to patient size and/or use of iterative reconstruction technique. COMPARISON:  12/05/2019 FINDINGS: Brain: No acute infarct or hemorrhage. Stable left frontotemporal encephalomalacia related to prior infarct. Lateral ventricles and midline structures are stable. No acute extra-axial fluid collections. No mass effect. Vascular: No hyperdense vessel or unexpected calcification. Skull: Normal. Negative for fracture or focal lesion. Sinuses/Orbits: No acute finding. Other: None. IMPRESSION: 1. No acute intracranial process. Electronically Signed   By: Randa Ngo M.D.   On: 08/16/2022 19:17     Assessment/Plan Principal Problem:   Acute encephalopathy    Acute encephalopathy -   cause not clear.  Patient is afebrile.  Patient's husband feels that patient may have inadvertently taken more of her medications including Flexeril and gabapentin.  I am holding of all her home medications this time.  Checking UA, urine drug screen, chest x-ray, COVID test, ammonia levels, TSH, B12, RPR and also will check EEG and MRI if patient is cooperating. History of paroxysmal atrial fibrillation Dr. Caryl Comes the EP cardiologist is planning pacemaker.  Was holding off until UTI clears.  Since patient takes apixaban we will keep patient on Lovenox at this time since patient cannot reliably  swallow at this time. Prior history of stroke.  Presently on Lovenox full dose. Anemia follow CBC.  Check anemia panel. Hypertension we will keep patient on as needed IV hydralazine for now. History of recurrent UTI was recently on antibiotics.  Check UA urine cultures.  Presently afebrile.   DVT prophylaxis: Lovenox. Code Status: DNR confirmed with patient's husband. Family Communication: Patient's husband. Disposition Plan: Admit to stepdown. Consults called: None. Admission status: Observe patient.

## 2022-08-17 ENCOUNTER — Observation Stay (HOSPITAL_COMMUNITY): Payer: Medicare Other

## 2022-08-17 ENCOUNTER — Inpatient Hospital Stay (HOSPITAL_COMMUNITY): Payer: Medicare Other

## 2022-08-17 ENCOUNTER — Encounter (HOSPITAL_COMMUNITY): Payer: Self-pay | Admitting: Internal Medicine

## 2022-08-17 DIAGNOSIS — Z79899 Other long term (current) drug therapy: Secondary | ICD-10-CM | POA: Diagnosis not present

## 2022-08-17 DIAGNOSIS — Z1152 Encounter for screening for COVID-19: Secondary | ICD-10-CM | POA: Diagnosis not present

## 2022-08-17 DIAGNOSIS — I480 Paroxysmal atrial fibrillation: Secondary | ICD-10-CM | POA: Diagnosis present

## 2022-08-17 DIAGNOSIS — I10 Essential (primary) hypertension: Secondary | ICD-10-CM | POA: Diagnosis present

## 2022-08-17 DIAGNOSIS — Z66 Do not resuscitate: Secondary | ICD-10-CM | POA: Diagnosis present

## 2022-08-17 DIAGNOSIS — Z87440 Personal history of urinary (tract) infections: Secondary | ICD-10-CM | POA: Diagnosis not present

## 2022-08-17 DIAGNOSIS — D649 Anemia, unspecified: Secondary | ICD-10-CM | POA: Diagnosis present

## 2022-08-17 DIAGNOSIS — F32A Depression, unspecified: Secondary | ICD-10-CM | POA: Diagnosis present

## 2022-08-17 DIAGNOSIS — I951 Orthostatic hypotension: Secondary | ICD-10-CM | POA: Diagnosis present

## 2022-08-17 DIAGNOSIS — Z882 Allergy status to sulfonamides status: Secondary | ICD-10-CM | POA: Diagnosis not present

## 2022-08-17 DIAGNOSIS — I4892 Unspecified atrial flutter: Secondary | ICD-10-CM | POA: Diagnosis present

## 2022-08-17 DIAGNOSIS — R4182 Altered mental status, unspecified: Secondary | ICD-10-CM

## 2022-08-17 DIAGNOSIS — G9341 Metabolic encephalopathy: Secondary | ICD-10-CM | POA: Diagnosis present

## 2022-08-17 DIAGNOSIS — Z7901 Long term (current) use of anticoagulants: Secondary | ICD-10-CM | POA: Diagnosis not present

## 2022-08-17 DIAGNOSIS — E876 Hypokalemia: Secondary | ICD-10-CM | POA: Diagnosis present

## 2022-08-17 DIAGNOSIS — Z85048 Personal history of other malignant neoplasm of rectum, rectosigmoid junction, and anus: Secondary | ICD-10-CM | POA: Diagnosis not present

## 2022-08-17 DIAGNOSIS — T481X1A Poisoning by skeletal muscle relaxants [neuromuscular blocking agents], accidental (unintentional), initial encounter: Secondary | ICD-10-CM | POA: Diagnosis present

## 2022-08-17 DIAGNOSIS — G928 Other toxic encephalopathy: Secondary | ICD-10-CM | POA: Diagnosis present

## 2022-08-17 DIAGNOSIS — E785 Hyperlipidemia, unspecified: Secondary | ICD-10-CM | POA: Diagnosis present

## 2022-08-17 DIAGNOSIS — F0393 Unspecified dementia, unspecified severity, with mood disturbance: Secondary | ICD-10-CM | POA: Diagnosis present

## 2022-08-17 DIAGNOSIS — I739 Peripheral vascular disease, unspecified: Secondary | ICD-10-CM | POA: Diagnosis present

## 2022-08-17 DIAGNOSIS — Z820 Family history of epilepsy and other diseases of the nervous system: Secondary | ICD-10-CM | POA: Diagnosis not present

## 2022-08-17 DIAGNOSIS — Z8673 Personal history of transient ischemic attack (TIA), and cerebral infarction without residual deficits: Secondary | ICD-10-CM | POA: Diagnosis not present

## 2022-08-17 DIAGNOSIS — N390 Urinary tract infection, site not specified: Secondary | ICD-10-CM | POA: Diagnosis present

## 2022-08-17 DIAGNOSIS — G9340 Encephalopathy, unspecified: Secondary | ICD-10-CM | POA: Diagnosis not present

## 2022-08-17 DIAGNOSIS — Z853 Personal history of malignant neoplasm of breast: Secondary | ICD-10-CM | POA: Diagnosis not present

## 2022-08-17 DIAGNOSIS — Z8249 Family history of ischemic heart disease and other diseases of the circulatory system: Secondary | ICD-10-CM | POA: Diagnosis not present

## 2022-08-17 LAB — RESP PANEL BY RT-PCR (RSV, FLU A&B, COVID)  RVPGX2
Influenza A by PCR: NEGATIVE
Influenza B by PCR: NEGATIVE
Resp Syncytial Virus by PCR: NEGATIVE
SARS Coronavirus 2 by RT PCR: NEGATIVE

## 2022-08-17 LAB — CBC WITH DIFFERENTIAL/PLATELET
Abs Immature Granulocytes: 0.05 10*3/uL (ref 0.00–0.07)
Basophils Absolute: 0 10*3/uL (ref 0.0–0.1)
Basophils Relative: 0 %
Eosinophils Absolute: 0 10*3/uL (ref 0.0–0.5)
Eosinophils Relative: 0 %
HCT: 32.7 % — ABNORMAL LOW (ref 36.0–46.0)
Hemoglobin: 10.7 g/dL — ABNORMAL LOW (ref 12.0–15.0)
Immature Granulocytes: 1 %
Lymphocytes Relative: 18 %
Lymphs Abs: 1 10*3/uL (ref 0.7–4.0)
MCH: 31.5 pg (ref 26.0–34.0)
MCHC: 32.7 g/dL (ref 30.0–36.0)
MCV: 96.2 fL (ref 80.0–100.0)
Monocytes Absolute: 1 10*3/uL (ref 0.1–1.0)
Monocytes Relative: 17 %
Neutro Abs: 3.6 10*3/uL (ref 1.7–7.7)
Neutrophils Relative %: 64 %
Platelets: 199 10*3/uL (ref 150–400)
RBC: 3.4 MIL/uL — ABNORMAL LOW (ref 3.87–5.11)
RDW: 13.3 % (ref 11.5–15.5)
WBC: 5.7 10*3/uL (ref 4.0–10.5)
nRBC: 0 % (ref 0.0–0.2)

## 2022-08-17 LAB — COMPREHENSIVE METABOLIC PANEL
ALT: 7 U/L (ref 0–44)
AST: 16 U/L (ref 15–41)
Albumin: 3.4 g/dL — ABNORMAL LOW (ref 3.5–5.0)
Alkaline Phosphatase: 46 U/L (ref 38–126)
Anion gap: 8 (ref 5–15)
BUN: 8 mg/dL (ref 8–23)
CO2: 30 mmol/L (ref 22–32)
Calcium: 10.3 mg/dL (ref 8.9–10.3)
Chloride: 96 mmol/L — ABNORMAL LOW (ref 98–111)
Creatinine, Ser: 0.56 mg/dL (ref 0.44–1.00)
GFR, Estimated: >60 mL/min (ref >60)
Glucose, Bld: 110 mg/dL — ABNORMAL HIGH (ref 70–99)
Potassium: 3.1 mmol/L — ABNORMAL LOW (ref 3.5–5.1)
Sodium: 134 mmol/L — ABNORMAL LOW (ref 135–145)
Total Bilirubin: 0.7 mg/dL (ref 0.3–1.2)
Total Protein: 6.5 g/dL (ref 6.5–8.1)

## 2022-08-17 LAB — RPR: RPR Ser Ql: NONREACTIVE

## 2022-08-17 MED ORDER — POTASSIUM CHLORIDE 10 MEQ/100ML IV SOLN
10.0000 meq | INTRAVENOUS | Status: AC
  Administered 2022-08-17 (×3): 10 meq via INTRAVENOUS
  Filled 2022-08-17 (×3): qty 100

## 2022-08-17 MED ORDER — POTASSIUM CHLORIDE CRYS ER 20 MEQ PO TBCR
20.0000 meq | EXTENDED_RELEASE_TABLET | Freq: Three times a day (TID) | ORAL | Status: AC
  Administered 2022-08-18 (×2): 20 meq via ORAL
  Filled 2022-08-17 (×4): qty 1

## 2022-08-17 MED ORDER — ENOXAPARIN SODIUM 60 MG/0.6ML IJ SOSY
60.0000 mg | PREFILLED_SYRINGE | Freq: Two times a day (BID) | INTRAMUSCULAR | Status: DC
  Administered 2022-08-17 – 2022-08-18 (×4): 60 mg via SUBCUTANEOUS
  Filled 2022-08-17 (×5): qty 0.6

## 2022-08-17 MED ORDER — ESCITALOPRAM OXALATE 10 MG PO TABS
10.0000 mg | ORAL_TABLET | Freq: Every day | ORAL | Status: DC
  Administered 2022-08-18 – 2022-08-19 (×2): 10 mg via ORAL
  Filled 2022-08-17 (×3): qty 1

## 2022-08-17 NOTE — Procedures (Signed)
Patient Name: Jeanette [name]  MRN: [id number]  Epilepsy Attending: Lora Havens  Referring Physician/Provider: Rise Patience, MD  Date: 08/17/2022 Duration: 27.06 mins  Patient history: 88yo F with ams. EEG to evaluate for seizure  Level of alertness: Awake  AEDs during EEG study: None  Technical aspects: This EEG study was done with scalp electrodes positioned according to the 10-20 International system of electrode placement. Electrical activity was reviewed with band pass filter of 1-70Hz$ , sensitivity of 7 uV/mm, display speed of 22m/sec with a 60Hz$  notched filter applied as appropriate. EEG data were recorded continuously and digitally stored.  Video monitoring was available and reviewed as appropriate.  Description: The posterior dominant rhythm consists of 8 Hz activity of moderate voltage (25-35 uV) seen predominantly in posterior head regions, symmetric and reactive to eye opening and eye closing. EEG showed continuous low amplitude 2-3hz$  delta slowing in right hemisphere as well as intermittent 3-6hz$  theta-delta slowing in left hemisphere. Hyperventilation and photic stimulation were not performed.     ABNORMALITY - Continuous slow, right hemisphere - Intermittent slow, left hemisphere  IMPRESSION: This study is is suggestive of cortical dysfunction arising from right and left hemisphere likely secondary to underlying structural abnormality. No seizures or epileptiform discharges were seen throughout the recording.  Carletta Feasel OBarbra Sarks

## 2022-08-17 NOTE — Evaluation (Signed)
Clinical/Bedside Swallow Evaluation Patient Details  Name: Jeanette [name] MRN: [id number] Date of Birth: 1934-10-31  Today's Date: 08/17/2022 Time: SLP Start Time (ACUTE ONLY): [id number] SLP Stop Time (ACUTE ONLY): [id number] SLP Time Calculation (min) (ACUTE ONLY): 11 min  Past Medical History:  Past Medical History:  Diagnosis Date   A-fib ([place]) 03/15/2018   Anal cancer ([place])    Anxiety    Arthritis    Breast cancer ([place])    Breast cancer ([place])    Cancer of breast ([place])    Depression    Dysrhythmia 2010   a-fib   Hyperlipidemia    Hypertension    PVD (peripheral vascular disease) ([place])    RAS (renal artery stenosis) ([place])    Stroke ([place]) 2019   no residual, x 2   Past Surgical History:  Past Surgical History:  Procedure Laterality Date   BREAST BIOPSY Left 01/14/2003   BREAST LUMPECTOMY Right 1989   CARDIOVASCULAR STRESS TEST  07/27/2003   No evidence if LV myocardial ischemia or scar. LV EF 74%.   GYNECOLOGIC CRYOSURGERY     IR CT HEAD LTD  12/04/2019   IR PERCUTANEOUS ART THROMBECTOMY/INFUSION INTRACRANIAL INC DIAG ANGIO  12/04/2019   IR US GUIDE VASC ACCESS LEFT  12/04/2019   OPEN REDUCTION INTERNAL FIXATION (ORIF) DISTAL RADIAL FRACTURE Left 02/19/2017   Procedure: OPEN REDUCTION INTERNAL FIXATION (ORIF) LEFT DISTAL RADIAL FRACTURE;  Surgeon: Leanora Cover, MD;  Location: [facility];  Service: Orthopedics;  Laterality: Left;   OVARIAN CYST REMOVAL     RADIOLOGY WITH ANESTHESIA N/A 12/04/2019   Procedure: IR WITH ANESTHESIA;  Surgeon: Luanne Bras, MD;  Location: [facility];  Service: Radiology;  Laterality: N/A;   RENAL DOPPLER  09/09/2012   Right proximal renal artery: 60-99% diameter reduction. Normal patency of the left main renal artery.    TRANSTHORACIC ECHOCARDIOGRAM  03/22/2006   EF 55-60%, mild mitral valvular regurg, pulmonary artery systolic pressure was moderately increased.   TUBAL LIGATION     HPI:  88 year old female with history of prior stroke  requiring thrombectomy (no prior ST notes), paroxysmal atrial fibrillation who is currently being evaluated for pacemaker placement, recurrent UTI and recently treated with penicillin, prior history of anal cancer and breast cancer, chronic anemia and hypertension, memory loss brought to the emergency room with increasing confusion over the last 3 days.  Husband at the bedside noticed worsening confusion which was pretty severe since last 3 days. Per chart husband thought she might have taken an additional dose of gabapentin and Flexeril. Found to have acute toxic metabolic encephalopathy in a patient with underlying dementia, may be additional dose of drug intake including Flexeril, still remains intermittently confused. Head CT negative.    Assessment / Plan / Recommendation  Clinical Impression  Pt was adequately awake and participatory for swallow evaluation and able to follow commands given additional time and cues. She was confused with irrelevant utterances but easily redirected to task. She has natural dentition and intact oromotor abilities. Pt consumed thin, applesauce and solid texture with good oral control and transit. Mastication was timely and thorough with graham cracker solid without residue. Sequential sips thin were consumed without s/s aspiration. There was one delayed cough at end of session that did not appear related to po intake, Given mild confusion and deconditioning, will initiate Dys 3 (chopped meats), thin liquids, pills whole in puree. Discussed with RN that if she pockets pills, they can be crushed. ST will follow for safety  and diet texture upgrade. SLP Visit Diagnosis: Dysphagia, unspecified (R13.10)    Aspiration Risk  Mild aspiration risk    Diet Recommendation Dysphagia 3 (Mech soft);Thin liquid   Liquid Administration via: Straw;Cup Medication Administration: Whole meds with puree Supervision: Staff to assist with self feeding;Full supervision/cueing for  compensatory strategies Compensations: Slow rate;Small sips/bites;Minimize environmental distractions Postural Changes: Seated upright at 90 degrees    Other  Recommendations Oral Care Recommendations: Oral care BID    Recommendations for follow up therapy are one component of a multi-disciplinary discharge planning process, led by the attending physician.  Recommendations may be updated based on patient status, additional functional criteria and insurance authorization.  Follow up Recommendations No SLP follow up      Assistance Recommended at Discharge    Functional Status Assessment Patient has had a recent decline in their functional status and demonstrates the ability to make significant improvements in function in a reasonable and predictable amount of time.  Frequency and Duration min 2x/week  2 weeks       Prognosis Prognosis for improved oropharyngeal function: Good Barriers to Reach Goals: Cognitive deficits      Swallow Study   General Date of Onset: 08/16/22 HPI: 88 year old female with history of prior stroke requiring thrombectomy (no prior ST notes), paroxysmal atrial fibrillation who is currently being evaluated for pacemaker placement, recurrent UTI and recently treated with penicillin, prior history of anal cancer and breast cancer, chronic anemia and hypertension, memory loss brought to the emergency room with increasing confusion over the last 3 days.  Husband at the bedside noticed worsening confusion which was pretty severe since last 3 days. Per chart husband thought she might have taken an additional dose of gabapentin and Flexeril. Found to have acute toxic metabolic encephalopathy in a patient with underlying dementia, may be additional dose of drug intake including Flexeril, still remains intermittently confused. Head CT negative. Type of Study: Bedside Swallow Evaluation Previous Swallow Assessment:  (none) Diet Prior to this Study: NPO Temperature Spikes  Noted: No Respiratory Status: Room air History of Recent Intubation: No Behavior/Cognition: Pleasant mood;Cooperative;Alert;Confused;Requires cueing Oral Cavity Assessment: Dry Oral Care Completed by SLP: No Oral Cavity - Dentition: Adequate natural dentition Vision: Functional for self-feeding Self-Feeding Abilities: Needs assist Patient Positioning: Upright in bed Baseline Vocal Quality: Normal Volitional Cough: Cognitively unable to elicit Volitional Swallow: Unable to elicit    Oral/Motor/Sensory Function Overall Oral Motor/Sensory Function: Within functional limits   Ice Chips Ice chips: Not tested   Thin Liquid Thin Liquid: Within functional limits Presentation: Straw    Nectar Thick Nectar Thick Liquid: Not tested   Honey Thick Honey Thick Liquid: Not tested   Puree Puree: Within functional limits   Solid     Solid: Within functional limits      Houston Siren 08/17/2022,3:19 PM

## 2022-08-17 NOTE — Progress Notes (Signed)
PROGRESS NOTE    Jeanette [name]  [id number] DOB: Oct 24, 1934 DOA: 08/16/2022 PCP: Crist Infante, MD    Brief Narrative:  88 year old female with history of prior stroke requiring thrombectomy, paroxysmal atrial fibrillation who is currently being evaluated for pacemaker placement, recurrent UTI and recently treated with penicillin, prior history of anal cancer and breast cancer, chronic anemia and hypertension, memory loss brought to the emergency room with increasing confusion over the last 3 days.  Husband at the bedside noticed worsening confusion which was pretty severe since last 3 days.  Husband thought she might have taken an additional dose of gabapentin and Flexeril.  No trauma.  No other infections.  In the emergency room hemodynamically stable.  Patient was fidgety.  CT scan negative.  Admitted due to significant symptoms.   Assessment & Plan:   Acute toxic metabolic encephalopathy in a patient with underlying dementia. May be additional dose of drug intake including Flexeril.  No focal deficits.  Still remains intermittently confused. No evidence of bacterial infection, urinalysis, UDS with opiates, chest x-ray clear.  COVID-19 negative. Ammonia, TSH, B12 are within normal limits. CT head without any acute findings. Holding all mind altering medications including gabapentin, narcotics, benzodiazepines and Flexeril. EEG done that does not show any epileptiform focus, however shows cortical dysfunction from bilateral hemisphere. Will attempt MRI once patient is more cooperative. Delirium precautions.  Fall precautions.  Speech, PT and OT. Patient does not have any prescription of narcotics as seen in her UA, should avoid Flexeril and gabapentin.  Paroxysmal atrial fibrillation with sinus bradycardia: Along with orthostatic hypotension and falls with syncope.  Posttermination pauses greater than 6 seconds. Seen by EP cardiology on 1/31.  Waiting for clinical recovery for pacemaker  placement. Currently remains on sinus bradycardia.  Eliquis changed to Lovenox because she was not reliably taking oral anticoagulation.  Once she is able to take oral medication safely, we will put her back on Eliquis.  Outpatient follow-up with cardiology as previously planned. Currently not on any amiodarone or metoprolol.  Heart rate is acceptable.  Will monitor.  Hypokalemia: Replace.  Hypertension with history of orthostatic hypotension: Treated with as needed hydralazine today.  Depression: On Cymbalta that will be continued today.  Consult to speech therapy to initiate oral intake.  Consult PT OT to mobilize.  DVT prophylaxis: SCDs Start: 08/16/22 2108   Code Status: DNR Family Communication: Husband at bedside Disposition Plan: Status is: Inpatient Remains inpatient appropriate because: Significant altered mental status.  Workup in progress.     Consultants:  None  Procedures:  None  Antimicrobials:  None   Subjective: Patient seen in the morning rounds.  She was getting EEG.  Husband was at the bedside.  She would answer simple questions.  However on further questioning and interview, patient will answer most of the questions including her name, current situation.  She was even right on time and place along with the dates.  Husband at the bedside.  Patient herself denied any other complaints.  She has flat affect and only answers what is asked. Husband stated her mentation somewhat improved than yesterday.  Objective: Vitals:   08/17/22 0200 08/17/22 0445 08/17/22 0814 08/17/22 1209  BP: (!) 165/46 (!) 132/92 (!) 186/85 (!) 185/87  Pulse: 82 81 79 70  Resp: 18 18 18 18  $ Temp: 98 F (36.7 C)  98.3 F (36.8 C) 97.8 F (36.6 C)  TempSrc:    Oral  SpO2: 95% 100%  97%   No  intake or output data in the 24 hours ending 08/17/22 1316 There were no vitals filed for this visit.  Examination:  General exam: Appears calm and comfortable  Frail.  Alert and awake.   Oriented to herself and place.  Oriented to time.  Not oriented to her clinical situation.  Moves all extremities. Respiratory system: Clear to auscultation. Respiratory effort normal. Cardiovascular system: S1 & S2 heard, RRR. No JVD, murmurs, rubs, gallops or clicks. No pedal edema. Gastrointestinal system: Abdomen is nondistended, soft and nontender. No organomegaly or masses felt. Normal bowel sounds heard. Central nervous system: No focal neurological deficits. Extremities: Symmetric but generalized weakness.   Data Reviewed: I have personally reviewed following labs and imaging studies  CBC: Recent Labs  Lab 08/16/22 1538 08/17/22 0414  WBC 7.1 5.7  NEUTROABS 4.2 3.6  HGB 11.7* 10.7*  HCT 36.4 32.7*  MCV 98.1 96.2  PLT 230 [id number]   Basic Metabolic Panel: Recent Labs  Lab 08/16/22 1538 08/17/22 0414  NA 138 134*  K 3.7 3.1*  CL 100 96*  CO2 30 30  GLUCOSE 110* 110*  BUN 12 8  CREATININE 0.78 0.56  CALCIUM 11.3* 10.3  MG 2.0  --    GFR: Estimated Creatinine Clearance: 42.5 mL/min (by C-G formula based on SCr of 0.56 mg/dL). Liver Function Tests: Recent Labs  Lab 08/16/22 1538 08/17/22 0414  AST 19 16  ALT 6 7  ALKPHOS 46 46  BILITOT 1.0 0.7  PROT 7.0 6.5  ALBUMIN 3.7 3.4*   No results for input(s): "LIPASE", "AMYLASE" in the last 168 hours. Recent Labs  Lab 08/16/22 2213  AMMONIA 19   Coagulation Profile: No results for input(s): "INR", "PROTIME" in the last 168 hours. Cardiac Enzymes: No results for input(s): "CKTOTAL", "CKMB", "CKMBINDEX", "TROPONINI" in the last 168 hours. BNP (last 3 results) No results for input(s): "PROBNP" in the last 8760 hours. HbA1C: No results for input(s): "HGBA1C" in the last 72 hours. CBG: No results for input(s): "GLUCAP" in the last 168 hours. Lipid Profile: No results for input(s): "CHOL", "HDL", "LDLCALC", "TRIG", "CHOLHDL", "LDLDIRECT" in the last 72 hours. Thyroid Function Tests: Recent Labs    08/16/22 2212   TSH 2.024   Anemia Panel: Recent Labs    08/16/22 2213  VITAMINB12 255   Sepsis Labs: No results for input(s): "PROCALCITON", "LATICACIDVEN" in the last 168 hours.  Recent Results (from the past 240 hour(s))  Resp panel by RT-PCR (RSV, Flu A&B, Covid) Anterior Nasal Swab     Status: None   Collection Time: 08/17/22  5:08 AM   Specimen: Anterior Nasal Swab  Result Value Ref Range Status   SARS Coronavirus 2 by RT PCR NEGATIVE NEGATIVE Final   Influenza A by PCR NEGATIVE NEGATIVE Final   Influenza B by PCR NEGATIVE NEGATIVE Final    Comment: (NOTE) The Xpert Xpress SARS-CoV-2/FLU/RSV plus assay is intended as an aid in the diagnosis of influenza from Nasopharyngeal swab specimens and should not be used as a sole basis for treatment. Nasal washings and aspirates are unacceptable for Xpert Xpress SARS-CoV-2/FLU/RSV testing.  Fact Sheet for Patients: EntrepreneurPulse.com.au  Fact Sheet for Healthcare Providers: IncredibleEmployment.be  This test is not yet approved or cleared by the [place] FDA and has been authorized for detection and/or diagnosis of SARS-CoV-2 by FDA under an Emergency Use Authorization (EUA). This EUA will remain in effect (meaning this test can be used) for the duration of the COVID-19 declaration under Section 564(b)(1) of the Act,  21 U.S.C. section 360bbb-3(b)(1), unless the authorization is terminated or revoked.     Resp Syncytial Virus by PCR NEGATIVE NEGATIVE Final    Comment: (NOTE) Fact Sheet for Patients: EntrepreneurPulse.com.au  Fact Sheet for Healthcare Providers: IncredibleEmployment.be  This test is not yet approved or cleared by the [place] FDA and has been authorized for detection and/or diagnosis of SARS-CoV-2 by FDA under an Emergency Use Authorization (EUA). This EUA will remain in effect (meaning this test can be used) for the duration of  the COVID-19 declaration under Section 564(b)(1) of the Act, 21 U.S.C. section 360bbb-3(b)(1), unless the authorization is terminated or revoked.  Performed at [facility], [place] [address] [place], [place] [address]          Radiology Studies: EEG adult  Result Date: Aug 26, 2022 Lora Havens, MD     2022-08-26 12:20 PM Patient Name: Jeanette [name] MRN: [id number] Epilepsy Attending: Lora Havens Referring Physician/Provider: Rise Patience, MD Date: August 26, 2022 Duration: 27.06 mins Patient history: 88yo F with ams. EEG to evaluate for seizure Level of alertness: Awake AEDs during EEG study: None Technical aspects: This EEG study was done with scalp electrodes positioned according to the 10-20 International system of electrode placement. Electrical activity was reviewed with band pass filter of 1-70Hz$ , sensitivity of 7 uV/mm, display speed of 43m/sec with a 60Hz$  notched filter applied as appropriate. EEG data were recorded continuously and digitally stored.  Video monitoring was available and reviewed as appropriate. Description: The posterior dominant rhythm consists of 8 Hz activity of moderate voltage (25-35 uV) seen predominantly in posterior head regions, symmetric and reactive to eye opening and eye closing. EEG showed continuous low amplitude 2-3hz$  delta slowing in right hemisphere as well as intermittent 3-6hz$  theta-delta slowing in left hemisphere. Hyperventilation and photic stimulation were not performed.   ABNORMALITY - Continuous slow, right hemisphere - Intermittent slow, left hemisphere IMPRESSION: This study is is suggestive of cortical dysfunction arising from right and left hemisphere likely secondary to underlying structural abnormality. No seizures or epileptiform discharges were seen throughout the recording. PLora Havens  DG CHEST PORT 1 VIEW  Result Date: 202-18-2024CLINICAL DATA:  88year old female with shortness of breath and altered mental status.  EXAM: PORTABLE CHEST 1 VIEW COMPARISON:  Portable chest 04/21/2022 and earlier. FINDINGS: Portable AP supine view at 0512 hours. Mildly lower lung volumes. Mediastinal contours are stable, no cardiomegaly. Visualized tracheal air column is within normal limits. Lung markings are stable. Allowing for portable technique the lungs are clear. No pneumothorax or pleural effusion. Negative visible bowel gas. No acute osseous abnormality identified. IMPRESSION: No acute cardiopulmonary abnormality. Electronically Signed   By: HGenevie AnnM.D.   On: 002-18-2405:18   CT Head Wo Contrast  Result Date: 08/16/2022 CLINICAL DATA:  Increasing confusion for 2 days, altered level of consciousness EXAM: CT HEAD WITHOUT CONTRAST TECHNIQUE: Contiguous axial images were obtained from the base of the skull through the vertex without intravenous contrast. RADIATION DOSE REDUCTION: This exam was performed according to the departmental dose-optimization program which includes automated exposure control, adjustment of the mA and/or kV according to patient size and/or use of iterative reconstruction technique. COMPARISON:  12/05/2019 FINDINGS: Brain: No acute infarct or hemorrhage. Stable left frontotemporal encephalomalacia related to prior infarct. Lateral ventricles and midline structures are stable. No acute extra-axial fluid collections. No mass effect. Vascular: No hyperdense vessel or unexpected calcification. Skull: Normal. Negative for fracture or focal lesion. Sinuses/Orbits: No acute  finding. Other: None. IMPRESSION: 1. No acute intracranial process. Electronically Signed   By: Randa Ngo M.D.   On: 08/16/2022 19:17        Scheduled Meds:  enoxaparin (LOVENOX) injection  60 mg Subcutaneous BID   escitalopram  10 mg Oral Daily   potassium chloride  20 mEq Oral TID   Continuous Infusions:  sodium chloride 75 mL/hr at 08/17/22 0703     LOS: 0 days    Time spent: 35 minutes    Barb Merino, MD [facility] Pager [phone]

## 2022-08-17 NOTE — Evaluation (Signed)
Physical Therapy Evaluation Patient Details Name: Jeanette [name] MRN: [id number] DOB: [birth year] Today's Date: 08/17/2022  History of Present Illness  88 y.o. female presents to [facility] hospital on 08/16/2022 with confusion. PMH includes afib, anal cancer, breast cancer, HLD, HTN, PVD, CVA.  Clinical Impression  Pt presents to PT with deficits in cognition, gait, balance, functional mobility, endurance, strength, power. Pt with very poor initiation, requiring PT assistance and tactile cues to initiate transfers and stepping. Pt demonstrates intermittent impairment in attention to left visual field during session as described below. Pt is at a high risk for falls and demonstrates little to no awareness of her current functional deficits. PT recommends SNF placement a this time.       Recommendations for follow up therapy are one component of a multi-disciplinary discharge planning process, led by the attending physician.  Recommendations may be updated based on patient status, additional functional criteria and insurance authorization.  Follow Up Recommendations Skilled nursing-short term rehab (<3 hours/day) Can patient physically be transported by private vehicle: No    Assistance Recommended at Discharge Frequent or constant Supervision/Assistance  Patient can return home with the following  A lot of help with walking and/or transfers;A lot of help with bathing/dressing/bathroom;Assistance with cooking/housework;Assistance with feeding;Direct supervision/assist for medications management;Direct supervision/assist for financial management;Assist for transportation;Help with stairs or ramp for entrance    Equipment Recommendations Wheelchair (measurements PT)  Recommendations for Other Services       Functional Status Assessment Patient has had a recent decline in their functional status and demonstrates the ability to make significant improvements in function in a reasonable and predictable amount  of time.     Precautions / Restrictions Precautions Precautions: Fall Precaution Comments: bilateral mitts Restrictions Weight Bearing Restrictions: No      Mobility  Bed Mobility Overal bed mobility: Needs Assistance Bed Mobility: Supine to Sit, Sit to Supine     Supine to sit: Mod assist, HOB elevated Sit to supine: Min assist        Transfers Overall transfer level: Needs assistance Equipment used: Rolling walker (2 wheels) Transfers: Sit to/from Stand Sit to Stand: Mod assist           General transfer comment: PT attempts with hand hold initially however pt does not utilizes LEs to attempt standing. With UE cues for RW use and facilitation of trunk flexion pt stands with modA for 2 attempts    Ambulation/Gait Ambulation/Gait assistance: Mod assist Gait Distance (Feet): 2 Feet Assistive device: Rolling walker (2 wheels) Gait Pattern/deviations: Step-to pattern       General Gait Details: pt does not initiate stepping without PT assist to laterally weight shift at trunk initially. After forst 2-3 steps pt requries reduced input to step forward/backward  Stairs            Wheelchair Mobility    Modified Rankin (Stroke Patients Only) Modified Rankin (Stroke Patients Only) Pre-Morbid Rankin Score: Moderately severe disability Modified Rankin: Moderately severe disability     Balance Overall balance assessment: Needs assistance Sitting-balance support: No upper extremity supported, Feet supported Sitting balance-Leahy Scale: Fair     Standing balance support: Bilateral upper extremity supported, Reliant on assistive device for balance Standing balance-Leahy Scale: Poor                               Pertinent Vitals/Pain Pain Assessment Pain Assessment: PAINAD Breathing: normal Negative Vocalization: none Facial Expression: smiling or  inexpressive Body Language: relaxed Consolability: no need to console PAINAD Score: 0     Home Living Family/patient expects to be discharged to:: Private residence Living Arrangements: Spouse/significant other Available Help at Discharge: Family;Available 24 hours/day Type of Home: House Home Access: Level entry       Home Layout: One level Home Equipment: Rollator (4 wheels)      Prior Function Prior Level of Function : Needs assist             Mobility Comments: ambulatory with rollator ADLs Comments: assistance with all ADLs/IADLs     Hand Dominance   Dominant Hand: Right    Extremity/Trunk Assessment   Upper Extremity Assessment Upper Extremity Assessment: Generalized weakness    Lower Extremity Assessment Lower Extremity Assessment: Generalized weakness    Cervical / Trunk Assessment Cervical / Trunk Assessment: Kyphotic  Communication   Communication: Expressive difficulties;Receptive difficulties  Cognition Arousal/Alertness: Awake/alert Behavior During Therapy: Flat affect Overall Cognitive Status: History of cognitive impairments - at baseline Area of Impairment: Attention, Following commands, Safety/judgement, Awareness, Problem solving                   Current Attention Level: Focused   Following Commands: Follows one step commands inconsistently, Follows multi-step commands inconsistently Safety/Judgement: Decreased awareness of safety, Decreased awareness of deficits Awareness: Intellectual Problem Solving: Slow processing, Decreased initiation General Comments: poor initiation, requires tactile cues and physical assistance to initiate all functional mobility tasks        General Comments General comments (skin integrity, edema, etc.): PT notes transitent L inattention, initially during session pt turning head/eyes to right despite PT stimulation on left side. Pt with difficulty tracking to left side or attending to visual stimuli to left of midline at times, although she does turn head to left when PT cues her to her  spouse's arrival    Exercises     Assessment/Plan    PT Assessment Patient needs continued PT services  PT Problem List Decreased strength;Decreased activity tolerance;Decreased balance;Decreased mobility;Decreased cognition;Decreased knowledge of use of DME;Decreased safety awareness;Decreased knowledge of precautions       PT Treatment Interventions Gait training;DME instruction;Functional mobility training;Therapeutic exercise;Balance training;Therapeutic activities;Neuromuscular re-education;Cognitive remediation;Patient/family education    PT Goals (Current goals can be found in the Care Plan section)  Acute Rehab PT Goals Patient Stated Goal: to return to ambulating PT Goal Formulation: With family Time For Goal Achievement: 08/31/22 Potential to Achieve Goals: Fair    Frequency Min 2X/week     Co-evaluation               AM-PAC PT "6 Clicks" Mobility  Outcome Measure Help needed turning from your back to your side while in a flat bed without using bedrails?: A Lot Help needed moving from lying on your back to sitting on the side of a flat bed without using bedrails?: A Lot Help needed moving to and from a bed to a chair (including a wheelchair)?: A Lot Help needed standing up from a chair using your arms (e.g., wheelchair or bedside chair)?: A Lot Help needed to walk in hospital room?: Total Help needed climbing 3-5 steps with a railing? : Total 6 Click Score: 10    End of Session   Activity Tolerance: Patient tolerated treatment well Patient left: in bed;with call bell/phone within reach;with bed alarm set;with family/visitor present Nurse Communication: Mobility status PT Visit Diagnosis: Other abnormalities of gait and mobility (R26.89);Muscle weakness (generalized) (M62.81);Other symptoms and signs involving the nervous  system (R29.898)    Time: [id number] PT Time Calculation (min) (ACUTE ONLY): 12 min   Charges:   PT Evaluation $PT Eval Low  Complexity: Concepcion, PT, DPT Acute Rehabilitation Office [phone]   Zenaida Niece 08/17/2022, 4:13 PM

## 2022-08-17 NOTE — ED Notes (Signed)
ED TO INPATIENT HANDOFF REPORT  ED Nurse Name and Phone #: [phone]  S Name/Age/Gender Jeanette [name] 88 y.o. female Room/Bed: 004C/004C  Code Status   Code Status: DNR  Home/SNF/Other Home Patient oriented to: self (usually oriented) Is this baseline? No   Triage Complete: Triage complete  Chief Complaint Acute encephalopathy [G93.40]  Triage Note Pt bib husband from home; AMS, increased confusion x 2 days; pt agitated in triage, unwilling to hold still for vitals; hx UTI, is currently being treated for one; husband endorses pt possibly taking home meds incorrectly; normally a and o x 4; husband states plan for pacemaker after UTI resolved; pt states pt began shaking Tuesday night; denies fevers   Allergies Allergies  Allergen Reactions   Epinephrine Anaphylaxis    Shortness of breath and increased heart rate   Cozaar [Losartan] Other (See Comments)    increased air hunger and dizziness.   Crestor [Rosuvastatin] Other (See Comments)    memory problems.   Septra [Sulfamethoxazole-Trimethoprim] Other (See Comments)    2017 had a strange reaction she says.   Vytorin [Ezetimibe-Simvastatin] Other (See Comments)    unknown   Benazepril Hcl Cough   Crestor [Rosuvastatin Calcium] Other (See Comments)    cognitive   Lisinopril Cough    Level of Care/Admitting Diagnosis ED Disposition     ED Disposition  Admit   Condition  --   [place]: [place] [[id number]]  Level of Care: Progressive [102]  Admit to Progressive based on following criteria: NEUROLOGICAL AND NEUROSURGICAL complex patients with significant risk of instability, who do not meet ICU criteria, yet require close observation or frequent assessment (< / = every 2 - 4 hours) with medical / nursing intervention.  May place patient in observation at [facility] or [place] if equivalent level of care is available:: No  Covid Evaluation: Asymptomatic - no recent exposure (last 10  days) testing not required  Diagnosis: Acute encephalopathy [id number]  Admitting Physician: Rise Patience [phone]  Attending Physician: Rise Patience [phone]          B Medical/Surgery History Past Medical History:  Diagnosis Date   A-fib ([place]) 03/15/2018   Anal cancer ([place])    Anxiety    Arthritis    Breast cancer ([place])    Breast cancer ([place])    Cancer of breast ([place])    Depression    Dysrhythmia 2010   a-fib   Hyperlipidemia    Hypertension    PVD (peripheral vascular disease) ([place])    RAS (renal artery stenosis) ([place])    Stroke ([place]) 2019   no residual, x 2   Past Surgical History:  Procedure Laterality Date   BREAST BIOPSY Left 01/14/2003   BREAST LUMPECTOMY Right 1989   CARDIOVASCULAR STRESS TEST  07/27/2003   No evidence if LV myocardial ischemia or scar. LV EF 74%.   GYNECOLOGIC CRYOSURGERY     IR CT HEAD LTD  12/04/2019   IR PERCUTANEOUS ART THROMBECTOMY/INFUSION INTRACRANIAL INC DIAG ANGIO  12/04/2019   IR US GUIDE VASC ACCESS LEFT  12/04/2019   OPEN REDUCTION INTERNAL FIXATION (ORIF) DISTAL RADIAL FRACTURE Left 02/19/2017   Procedure: OPEN REDUCTION INTERNAL FIXATION (ORIF) LEFT DISTAL RADIAL FRACTURE;  Surgeon: Leanora Cover, MD;  Location: [place];  Service: Orthopedics;  Laterality: Left;   OVARIAN CYST REMOVAL     RADIOLOGY WITH ANESTHESIA N/A 12/04/2019   Procedure: IR WITH ANESTHESIA;  Surgeon: Luanne Bras, MD;  Location: [place];  Service: Radiology;  Laterality: N/A;   RENAL DOPPLER  09/09/2012   Right proximal renal artery: 60-99% diameter reduction. Normal patency of the left main renal artery.    TRANSTHORACIC ECHOCARDIOGRAM  03/22/2006   EF 55-60%, mild mitral valvular regurg, pulmonary artery systolic pressure was moderately increased.   TUBAL LIGATION       A IV Location/Drains/Wounds Patient Lines/Drains/Airways Status     Active Line/Drains/Airways     Name Placement date Placement time Site Days   Peripheral  IV 08/16/22 20 G Left Antecubital 08/16/22  1615  Antecubital  1            Intake/Output Last 24 hours No intake or output data in the 24 hours ending 08/17/22 0520  Labs/Imaging Results for orders placed or performed during the hospital encounter of 08/16/22 (from the past 48 hour(s))  CBC with Differential     Status: Abnormal   Collection Time: 08/16/22  3:38 PM  Result Value Ref Range   WBC 7.1 4.0 - 10.5 K/uL   RBC 3.71 (L) 3.87 - 5.11 MIL/uL   Hemoglobin 11.7 (L) 12.0 - 15.0 g/dL   HCT 36.4 36.0 - 46.0 %   MCV 98.1 80.0 - 100.0 fL   MCH 31.5 26.0 - 34.0 pg   MCHC 32.1 30.0 - 36.0 g/dL   RDW 13.6 11.5 - 15.5 %   Platelets 230 150 - 400 K/uL   nRBC 0.0 0.0 - 0.2 %   Neutrophils Relative % 59 %   Neutro Abs 4.2 1.7 - 7.7 K/uL   Lymphocytes Relative 20 %   Lymphs Abs 1.4 0.7 - 4.0 K/uL   Monocytes Relative 20 %   Monocytes Absolute 1.4 (H) 0.1 - 1.0 K/uL   Eosinophils Relative 0 %   Eosinophils Absolute 0.0 0.0 - 0.5 K/uL   Basophils Relative 0 %   Basophils Absolute 0.0 0.0 - 0.1 K/uL   Immature Granulocytes 1 %   Abs Immature Granulocytes 0.05 0.00 - 0.07 K/uL    Comment: Performed at [facility], [address] [place], [place] [address]  Comprehensive metabolic panel     Status: Abnormal   Collection Time: 08/16/22  3:38 PM  Result Value Ref Range   Sodium 138 135 - 145 mmol/L   Potassium 3.7 3.5 - 5.1 mmol/L   Chloride 100 98 - 111 mmol/L   CO2 30 22 - 32 mmol/L   Glucose, Bld 110 (H) 70 - 99 mg/dL    Comment: Glucose reference range applies only to samples taken after fasting for at least 8 hours.   BUN 12 8 - 23 mg/dL   Creatinine, Ser 0.78 0.44 - 1.00 mg/dL   Calcium 11.3 (H) 8.9 - 10.3 mg/dL   Total Protein 7.0 6.5 - 8.1 g/dL   Albumin 3.7 3.5 - 5.0 g/dL   AST 19 15 - 41 U/L   ALT 6 0 - 44 U/L   Alkaline Phosphatase 46 38 - 126 U/L   Total Bilirubin 1.0 0.3 - 1.2 mg/dL   GFR, Estimated >60 >60 mL/min    Comment: (NOTE) Calculated using  the CKD-EPI Creatinine Equation (2021)    Anion gap 8 5 - 15    Comment: Performed at [facility] [address] [place], [place] [address]  Magnesium     Status: None   Collection Time: 08/16/22  3:38 PM  Result Value Ref Range   Magnesium 2.0 1.7 - 2.4  mg/dL    Comment: Performed at [facility], [place] [address], [place], [place] [address]  Ethanol     Status: None   Collection Time: 08/16/22  3:38 PM  Result Value Ref Range   Alcohol, Ethyl (B) <10 <10 mg/dL    Comment: (NOTE) Lowest detectable limit for serum alcohol is 10 mg/dL.  For medical purposes only. Performed at [facility], [place] [address] [place], [place] [id number]   Salicylate level     Status: Abnormal   Collection Time: 08/16/22  3:38 PM  Result Value Ref Range   Salicylate Lvl Q000111Q (L) 7.0 - 30.0 mg/dL    Comment: Performed at [facility] [address] [place], [place] [address]  Acetaminophen level     Status: Abnormal   Collection Time: 08/16/22  3:38 PM  Result Value Ref Range   Acetaminophen (Tylenol), Serum <10 (L) 10 - 30 ug/mL    Comment: (NOTE) Therapeutic concentrations vary significantly. A range of 10-30 ug/mL  may be an effective concentration for many patients. However, some  are best treated at concentrations outside of this range. Acetaminophen concentrations >150 ug/mL at 4 hours after ingestion  and >50 ug/mL at 12 hours after ingestion are often associated with  toxic reactions.  Performed at [facility], [place] [address], [place],  [address]   Urinalysis, w/ Reflex to Culture (Infection Suspected) -Urine, Clean Catch     Status: Abnormal   Collection Time: 08/16/22 10:05 PM  Result Value Ref Range   Specimen Source URINE, CLEAN CATCH    Color, Urine YELLOW YELLOW   APPearance HAZY (A) CLEAR   Specific Gravity, Urine 1.017 1.005 - 1.030   pH 6.0 5.0 - 8.0   Glucose, UA NEGATIVE NEGATIVE mg/dL   Hgb urine dipstick MODERATE (A) NEGATIVE    Bilirubin Urine NEGATIVE NEGATIVE   Ketones, ur 20 (A) NEGATIVE mg/dL   Protein, ur NEGATIVE NEGATIVE mg/dL   Nitrite NEGATIVE NEGATIVE   Leukocytes,Ua NEGATIVE NEGATIVE   RBC / HPF 11-20 0 - 5 RBC/hpf   WBC, UA 0-5 0 - 5 WBC/hpf    Comment:        Reflex urine culture not performed if WBC <=10, OR if Squamous epithelial cells >5. If Squamous epithelial cells >5 suggest recollection.    Bacteria, UA NONE SEEN NONE SEEN   Squamous Epithelial / HPF 0-5 0 - 5 /HPF    Comment: Performed at [facility], [place] [address], [place],  [address]  Rapid urine drug screen (hospital performed)     Status: Abnormal   Collection Time: 08/16/22 10:05 PM  Result Value Ref Range   Opiates POSITIVE (A) NONE DETECTED   Cocaine NONE DETECTED NONE DETECTED   Benzodiazepines NONE DETECTED NONE DETECTED   Amphetamines NONE DETECTED NONE DETECTED   Tetrahydrocannabinol NONE DETECTED NONE DETECTED   Barbiturates NONE DETECTED NONE DETECTED    Comment: (NOTE) DRUG SCREEN FOR MEDICAL PURPOSES ONLY.  IF CONFIRMATION IS NEEDED FOR ANY PURPOSE, NOTIFY LAB WITHIN 5 DAYS.  LOWEST DETECTABLE LIMITS FOR URINE DRUG SCREEN Drug Class                     Cutoff (ng/mL) Amphetamine and metabolites    1000 Barbiturate and metabolites    200 Benzodiazepine                 200 Opiates and metabolites  300 Cocaine and metabolites        300 THC                            50 Performed at [facility], [place] [address], [place], [place] [address]   TSH     Status: None   Collection Time: 08/16/22 10:12 PM  Result Value Ref Range   TSH 2.024 0.350 - 4.500 uIU/mL    Comment: Performed by a 3rd Generation assay with a functional sensitivity of <=0.01 uIU/mL. Performed at [facility], [place] [address] [place], [place] [address]   Ammonia     Status: None   Collection Time: 08/16/22 10:13 PM  Result Value Ref Range   Ammonia 19 9 - 35 umol/L    Comment: Performed at [facility], [place] [address] [place], [place] [address]  Vitamin B12     Status: None   Collection Time: 08/16/22 10:13 PM  Result Value Ref Range   Vitamin B-12 255 180 - 914 pg/mL    Comment: (NOTE) This assay is not validated for testing neonatal or myeloproliferative syndrome specimens for Vitamin B12 levels. Performed at [facility], [place] [address] [place], [place] [address]   Comprehensive metabolic panel     Status: Abnormal   Collection Time: 08/17/22  4:14 AM  Result Value Ref Range   Sodium 134 (L) 135 - 145 mmol/L   Potassium 3.1 (L) 3.5 - 5.1 mmol/L   Chloride 96 (L) 98 - 111 mmol/L   CO2 30 22 - 32 mmol/L   Glucose, Bld 110 (H) 70 - 99 mg/dL    Comment: Glucose reference range applies only to samples taken after fasting for at least 8 hours.   BUN 8 8 - 23 mg/dL   Creatinine, Ser 0.56 0.44 - 1.00 mg/dL   Calcium 10.3 8.9 - 10.3 mg/dL   Total Protein 6.5 6.5 - 8.1 g/dL   Albumin 3.4 (L) 3.5 - 5.0 g/dL   AST 16 15 - 41 U/L   ALT 7 0 - 44 U/L   Alkaline Phosphatase 46 38 - 126 U/L   Total Bilirubin 0.7 0.3 - 1.2 mg/dL   GFR, Estimated >60 >60 mL/min    Comment: (NOTE) Calculated using the CKD-EPI Creatinine Equation (2021)    Anion gap 8 5 - 15    Comment: Performed at [facility] [address], [place], [place] [address]  CBC with Differential/Platelet     Status: Abnormal   Collection Time: 08/17/22  4:14 AM  Result Value Ref Range   WBC 5.7 4.0 - 10.5 K/uL   RBC 3.40 (L) 3.87 - 5.11 MIL/uL   Hemoglobin 10.7 (L) 12.0 - 15.0 g/dL   HCT 32.7 (L) 36.0 - 46.0 %   MCV 96.2 80.0 - 100.0 fL   MCH 31.5 26.0 - 34.0 pg   MCHC 32.7 30.0 - 36.0 g/dL   RDW 13.3 11.5 - 15.5 %   Platelets 199 150 - 400 K/uL   nRBC 0.0 0.0 - 0.2 %   Neutrophils Relative % 64 %   Neutro Abs 3.6 1.7 - 7.7 K/uL   Lymphocytes Relative 18 %   Lymphs Abs 1.0 0.7 - 4.0 K/uL   Monocytes Relative 17 %   Monocytes Absolute 1.0 0.1 - 1.0 K/uL   Eosinophils Relative 0 %    Eosinophils Absolute 0.0 0.0 -  0.5 K/uL   Basophils Relative 0 %   Basophils Absolute 0.0 0.0 - 0.1 K/uL   Immature Granulocytes 1 %   Abs Immature Granulocytes 0.05 0.00 - 0.07 K/uL    Comment: Performed at [facility], [place] [address] [place], [place] [address]   CT Head Wo Contrast  Result Date: 08/16/2022 CLINICAL DATA:  Increasing confusion for 2 days, altered level of consciousness EXAM: CT HEAD WITHOUT CONTRAST TECHNIQUE: Contiguous axial images were obtained from the base of the skull through the vertex without intravenous contrast. RADIATION DOSE REDUCTION: This exam was performed according to the departmental dose-optimization program which includes automated exposure control, adjustment of the mA and/or kV according to patient size and/or use of iterative reconstruction technique. COMPARISON:  12/05/2019 FINDINGS: Brain: No acute infarct or hemorrhage. Stable left frontotemporal encephalomalacia related to prior infarct. Lateral ventricles and midline structures are stable. No acute extra-axial fluid collections. No mass effect. Vascular: No hyperdense vessel or unexpected calcification. Skull: Normal. Negative for fracture or focal lesion. Sinuses/Orbits: No acute finding. Other: None. IMPRESSION: 1. No acute intracranial process. Electronically Signed   By: Randa Ngo M.D.   On: 08/16/2022 19:17    Pending Labs Unresulted Labs (From admission, onward)     Start     Ordered   08/17/22 0459  RPR  Once,   R        08/17/22 0458   08/17/22 0458  Resp panel by RT-PCR (RSV, Flu A&B, Covid) Anterior Nasal Swab  (Tier 2 - SymptomaticResp panel by RT-PCR (RSV, Flu A&B, Covid))  Once,   R        08/17/22 0457   08/16/22 2111  Urinalysis, Routine w reflex microscopic -Urine, Clean Catch  ONCE - URGENT,   URGENT       Question:  Specimen Source  Answer:  Urine, Clean Catch   08/16/22 2110            Vitals/Pain Today's Vitals   08/17/22 0009 08/17/22 0030 08/17/22 0200  08/17/22 0445  BP: 123/66  (!) 165/46 (!) 132/92  Pulse: 84 84 82 81  Resp: 18  18 18  $ Temp:   98 F (36.7 C)   SpO2: 95% 94% 95% 100%  PainSc: Asleep  0-No pain     Isolation Precautions Airborne and Contact precautions  Medications Medications  acetaminophen (TYLENOL) tablet 650 mg (has no administration in time range)    Or  acetaminophen (TYLENOL) suppository 650 mg (has no administration in time range)  hydrALAZINE (APRESOLINE) injection 10 mg (has no administration in time range)  0.9 %  sodium chloride infusion ( Intravenous New Bag/Given 08/17/22 0037)  enoxaparin (LOVENOX) injection 60 mg (60 mg Subcutaneous Given 08/17/22 0035)    Mobility walks with device     Focused Assessments Neuro Assessment Handoff:  Swallow screen pass? No          Neuro Assessment: Within Defined Limits Neuro Checks:      Has TPA been given? No If patient is a Neuro Trauma and patient is going to OR before floor call report to [place] nurse: [phone] or [phone]   R Recommendations: See Admitting Provider Note  Report given to:   Additional Notes: Patient will be sent to the floor 0555. In the main time, call me if you have questions

## 2022-08-17 NOTE — Progress Notes (Signed)
  Transition of Care [facility]) Screening Note   Patient Details  Name: Jeanette [name] Date of Birth: 05-18-1935   Transition [facility]) CM/SW Contact:    Pollie Friar, RN Phone Number: 08/17/2022, 3:45 PM   Pt is from home with spouse. Awaiting therapy evals. Transition of Care Department [facility]) has reviewed patient. We will continue to monitor patient advancement through interdisciplinary progression rounds. If new patient transition needs arise, please place a TOC consult.

## 2022-08-17 NOTE — Progress Notes (Signed)
EEG complete - results pending 

## 2022-08-18 ENCOUNTER — Inpatient Hospital Stay (HOSPITAL_COMMUNITY): Payer: Medicare Other

## 2022-08-18 DIAGNOSIS — G9340 Encephalopathy, unspecified: Secondary | ICD-10-CM | POA: Diagnosis not present

## 2022-08-18 MED ORDER — APIXABAN 2.5 MG PO TABS
2.5000 mg | ORAL_TABLET | Freq: Two times a day (BID) | ORAL | Status: DC
  Administered 2022-08-18 – 2022-08-19 (×3): 2.5 mg via ORAL
  Filled 2022-08-18 (×3): qty 1

## 2022-08-18 MED ORDER — IRBESARTAN 150 MG PO TABS
150.0000 mg | ORAL_TABLET | Freq: Every day | ORAL | Status: DC
  Administered 2022-08-18 – 2022-08-19 (×2): 150 mg via ORAL
  Filled 2022-08-18 (×2): qty 1

## 2022-08-18 MED ORDER — APIXABAN 2.5 MG PO TABS: 2.5000 mg | ORAL_TABLET | Freq: Two times a day (BID) | ORAL | Status: DC

## 2022-08-18 MED ORDER — APIXABAN 5 MG PO TABS: 5.0000 mg | ORAL_TABLET | Freq: Two times a day (BID) | ORAL | Status: DC

## 2022-08-18 NOTE — Progress Notes (Signed)
PROGRESS NOTE    Jeanette [name]  [id number] DOB: 02/11/1935 DOA: 08/16/2022 PCP: Crist Infante, MD    Brief Narrative:  88 year old female with history of prior stroke requiring thrombectomy, paroxysmal atrial fibrillation who is currently being evaluated for pacemaker placement, recurrent UTI and recently treated with penicillin, prior history of anal cancer and breast cancer, chronic anemia and hypertension, memory loss brought to the emergency room with increasing confusion over the last 3 days.  Husband at the bedside noticed worsening confusion which was pretty severe since last 3 days.  Husband thought she might have taken an additional dose of gabapentin and Flexeril.  No trauma.  No other infections.  In the emergency room hemodynamically stable.  Patient was fidgety.  CT scan negative.  Admitted due to significant symptoms.   Assessment & Plan:   Acute toxic metabolic encephalopathy in a patient with underlying dementia. May be due to additional dose of drug intake including Flexeril.  No focal deficits. Mental status is improving. No evidence of bacterial infection, urinalysis, UDS with opiates, chest x-ray clear.  COVID-19 negative. Ammonia, TSH, B12 are within normal limits. CT head without any acute findings. Holding all mind altering medications including gabapentin, narcotics, benzodiazepines and Flexeril. EEG done that does not show any epileptiform focus, however shows cortical dysfunction from bilateral hemisphere. Will attempt MRI of the brain today. Delirium precautions.  Fall precautions.  Speech, PT and OT. Patient does not have any prescription of narcotics as seen in her UA, should avoid Flexeril and gabapentin.  Paroxysmal atrial fibrillation with sinus bradycardia: Along with orthostatic hypotension and falls with syncope.  Posttermination pauses greater than 6 seconds. Seen by EP cardiology on 1/31.  Waiting for clinical recovery for pacemaker  placement. Currently remains on sinus bradycardia.  Eliquis changed to Lovenox because she was not reliably taking oral anticoagulation.  Once she is able to take oral medication safely, we will put her back on Eliquis.  Outpatient follow-up with cardiology as previously planned. Currently not on any amiodarone or metoprolol.  Heart rate is acceptable.  Will monitor.  Hypokalemia: Replaced.   Hypertension with history of orthostatic hypotension: Treated with as needed hydralazine today.  Depression: On Cymbalta that will be continued today.  Consult to speech therapy to initiate oral intake.  Consult PT OT to mobilize.  May need short-term rehab.  DVT prophylaxis: SCDs Start: 08/16/22 2108   Code Status: DNR Family Communication: Husband at bedside Disposition Plan: Status is: Inpatient Remains inpatient appropriate because: Significant altered mental status.  Workup in progress.  May need rehab.     Consultants:  None  Procedures:  None  Antimicrobials:  None   Subjective:  Patient seen and examined.  Today she is more communicating.  She tells me "I forgot where I am".  No other overnight events noted.  Now remains more calm and quiet and has not fidgety as before.  Objective: Vitals:   08/18/22 0002 08/18/22 0405 08/18/22 0748 08/18/22 0815  BP: (!) 155/72 (!) 148/71 (!) 166/74 (!) 150/72  Pulse: 86 79 75 70  Resp: 17 18 16   $ Temp: 97.9 F (36.6 C) 97.8 F (36.6 C) 98.3 F (36.8 C)   TempSrc: Oral Oral Oral   SpO2: 100% 100% 97%   Weight:      Height:        Intake/Output Summary (Last 24 hours) at 08/18/2022 1028 Last data filed at 08/18/2022 0002 Gross per 24 hour  Intake 5 ml  Output 400  ml  Net -395 ml   Filed Weights   08/17/22 1513  Weight: 56 kg    Examination:  General exam: Appears calm and comfortable .  Interactive.  Pleasant to conversation but forgetful.  She is oriented to herself and family. Oriented to time.  Not oriented to her  clinical situation.  Moves all extremities. Respiratory system: Clear to auscultation. Respiratory effort normal. Cardiovascular system: S1 & S2 heard, RRR. No JVD, murmurs, rubs, gallops or clicks. No pedal edema. Gastrointestinal system: Abdomen is nondistended, soft and nontender. No organomegaly or masses felt. Normal bowel sounds heard. Central nervous system: No focal neurological deficits. Extremities: Symmetric but generalized weakness.   Data Reviewed: I have personally reviewed following labs and imaging studies  CBC: Recent Labs  Lab 08/16/22 1538 08/17/22 0414  WBC 7.1 5.7  NEUTROABS 4.2 3.6  HGB 11.7* 10.7*  HCT 36.4 32.7*  MCV 98.1 96.2  PLT 230 [id number]   Basic Metabolic Panel: Recent Labs  Lab 08/16/22 1538 08/17/22 0414  NA 138 134*  K 3.7 3.1*  CL 100 96*  CO2 30 30  GLUCOSE 110* 110*  BUN 12 8  CREATININE 0.78 0.56  CALCIUM 11.3* 10.3  MG 2.0  --    GFR: Estimated Creatinine Clearance: 42.5 mL/min (by C-G formula based on SCr of 0.56 mg/dL). Liver Function Tests: Recent Labs  Lab 08/16/22 1538 08/17/22 0414  AST 19 16  ALT 6 7  ALKPHOS 46 46  BILITOT 1.0 0.7  PROT 7.0 6.5  ALBUMIN 3.7 3.4*   No results for input(s): "LIPASE", "AMYLASE" in the last 168 hours. Recent Labs  Lab 08/16/22 2213  AMMONIA 19   Coagulation Profile: No results for input(s): "INR", "PROTIME" in the last 168 hours. Cardiac Enzymes: No results for input(s): "CKTOTAL", "CKMB", "CKMBINDEX", "TROPONINI" in the last 168 hours. BNP (last 3 results) No results for input(s): "PROBNP" in the last 8760 hours. HbA1C: No results for input(s): "HGBA1C" in the last 72 hours. CBG: No results for input(s): "GLUCAP" in the last 168 hours. Lipid Profile: No results for input(s): "CHOL", "HDL", "LDLCALC", "TRIG", "CHOLHDL", "LDLDIRECT" in the last 72 hours. Thyroid Function Tests: Recent Labs    08/16/22 2212  TSH 2.024   Anemia Panel: Recent Labs    08/16/22 2213   VITAMINB12 255   Sepsis Labs: No results for input(s): "PROCALCITON", "LATICACIDVEN" in the last 168 hours.  Recent Results (from the past 240 hour(s))  Resp panel by RT-PCR (RSV, Flu A&B, Covid) Anterior Nasal Swab     Status: None   Collection Time: 08/17/22  5:08 AM   Specimen: Anterior Nasal Swab  Result Value Ref Range Status   SARS Coronavirus 2 by RT PCR NEGATIVE NEGATIVE Final   Influenza A by PCR NEGATIVE NEGATIVE Final   Influenza B by PCR NEGATIVE NEGATIVE Final    Comment: (NOTE) The Xpert Xpress SARS-CoV-2/FLU/RSV plus assay is intended as an aid in the diagnosis of influenza from Nasopharyngeal swab specimens and should not be used as a sole basis for treatment. Nasal washings and aspirates are unacceptable for Xpert Xpress SARS-CoV-2/FLU/RSV testing.  Fact Sheet for Patients: EntrepreneurPulse.com.au  Fact Sheet for Healthcare Providers: IncredibleEmployment.be  This test is not yet approved or cleared by the [place] FDA and has been authorized for detection and/or diagnosis of SARS-CoV-2 by FDA under an Emergency Use Authorization (EUA). This EUA will remain in effect (meaning this test can be used) for the duration of the COVID-19 declaration under Section  564(b)(1) of the Act, 21 U.S.C. section 360bbb-3(b)(1), unless the authorization is terminated or revoked.     Resp Syncytial Virus by PCR NEGATIVE NEGATIVE Final    Comment: (NOTE) Fact Sheet for Patients: EntrepreneurPulse.com.au  Fact Sheet for Healthcare Providers: IncredibleEmployment.be  This test is not yet approved or cleared by the [place] FDA and has been authorized for detection and/or diagnosis of SARS-CoV-2 by FDA under an Emergency Use Authorization (EUA). This EUA will remain in effect (meaning this test can be used) for the duration of the COVID-19 declaration under Section 564(b)(1) of the Act, 21  U.S.C. section 360bbb-3(b)(1), unless the authorization is terminated or revoked.  Performed at [facility], [place] [address], [place], [place] [address]          Radiology Studies: EEG adult  Result Date: 09/09/22 Lora Havens, MD     09-09-22 12:20 PM Patient Name: CLYDIA DURFEY MRN: [id number] Epilepsy Attending: Lora Havens Referring Physician/Provider: Rise Patience, MD Date: 09/09/22 Duration: 27.06 mins Patient history: 88yo F with ams. EEG to evaluate for seizure Level of alertness: Awake AEDs during EEG study: None Technical aspects: This EEG study was done with scalp electrodes positioned according to the 10-20 International system of electrode placement. Electrical activity was reviewed with band pass filter of 1-70Hz$ , sensitivity of 7 uV/mm, display speed of 71m/sec with a 60Hz$  notched filter applied as appropriate. EEG data were recorded continuously and digitally stored.  Video monitoring was available and reviewed as appropriate. Description: The posterior dominant rhythm consists of 8 Hz activity of moderate voltage (25-35 uV) seen predominantly in posterior head regions, symmetric and reactive to eye opening and eye closing. EEG showed continuous low amplitude 2-3hz$  delta slowing in right hemisphere as well as intermittent 3-6hz$  theta-delta slowing in left hemisphere. Hyperventilation and photic stimulation were not performed.   ABNORMALITY - Continuous slow, right hemisphere - Intermittent slow, left hemisphere IMPRESSION: This study is is suggestive of cortical dysfunction arising from right and left hemisphere likely secondary to underlying structural abnormality. No seizures or epileptiform discharges were seen throughout the recording. PLora Havens  DG CHEST PORT 1 VIEW  Result Date: 203-Mar-2024CLINICAL DATA:  88year old female with shortness of breath and altered mental status. EXAM: PORTABLE CHEST 1 VIEW COMPARISON:  Portable chest 04/21/2022  and earlier. FINDINGS: Portable AP supine view at 0512 hours. Mildly lower lung volumes. Mediastinal contours are stable, no cardiomegaly. Visualized tracheal air column is within normal limits. Lung markings are stable. Allowing for portable technique the lungs are clear. No pneumothorax or pleural effusion. Negative visible bowel gas. No acute osseous abnormality identified. IMPRESSION: No acute cardiopulmonary abnormality. Electronically Signed   By: HGenevie AnnM.D.   On: 003/09/2403:18   CT Head Wo Contrast  Result Date: 08/16/2022 CLINICAL DATA:  Increasing confusion for 2 days, altered level of consciousness EXAM: CT HEAD WITHOUT CONTRAST TECHNIQUE: Contiguous axial images were obtained from the base of the skull through the vertex without intravenous contrast. RADIATION DOSE REDUCTION: This exam was performed according to the departmental dose-optimization program which includes automated exposure control, adjustment of the mA and/or kV according to patient size and/or use of iterative reconstruction technique. COMPARISON:  12/05/2019 FINDINGS: Brain: No acute infarct or hemorrhage. Stable left frontotemporal encephalomalacia related to prior infarct. Lateral ventricles and midline structures are stable. No acute extra-axial fluid collections. No mass effect. Vascular: No hyperdense vessel or unexpected calcification. Skull: Normal. Negative for fracture or focal  lesion. Sinuses/Orbits: No acute finding. Other: None. IMPRESSION: 1. No acute intracranial process. Electronically Signed   By: Randa Ngo M.D.   On: 08/16/2022 19:17        Scheduled Meds:  enoxaparin (LOVENOX) injection  60 mg Subcutaneous BID   escitalopram  10 mg Oral Daily   potassium chloride  20 mEq Oral TID   Continuous Infusions:     LOS: 1 day    Time spent: 35 minutes    Barb Merino, MD [facility] Pager [phone]

## 2022-08-18 NOTE — Plan of Care (Signed)
  Problem: Activity: Goal: Risk for activity intolerance will decrease Outcome: Progressing   Problem: Coping: Goal: Level of anxiety will decrease Outcome: Progressing   Problem: Safety: Goal: Ability to remain free from injury will improve Outcome: Progressing   Problem: Skin Integrity: Goal: Risk for impaired skin integrity will decrease Outcome: Progressing   

## 2022-08-18 NOTE — Evaluation (Addendum)
Occupational Therapy Evaluation Patient Details Name: Jeanette [name] MRN: [id number] DOB: September 25, 1934 Today's Date: 08/18/2022   History of Present Illness 88 y.o. female presents to [facility] hospital on 08/16/2022 with confusion. PMH includes afib, anal cancer, breast cancer, HLD, HTN, PVD, CVA.   Clinical Impression   Pt presents with decline in function and safety with ADLs and ADL mobility with impaired, strength, balance, endurance and cognition. PTA pt lived at home with her husband and required assist with ADLs at baseline (uncertain of  amount of assist, pt unable to quantify). Pt currently requires min A with UB ADLs, max A with LB ADLs, dep with toileting and mod A with SPTs to [facility]; pt a poor historian and requires multimodal cues to initiate tasks. Pt also not aware that she is in the hospital. Pt would benefit from acute OT services to address impairments to maximize level of function and safety       Recommendations for follow up therapy are one component of a multi-disciplinary discharge planning process, led by the attending physician.  Recommendations may be updated based on patient status, additional functional criteria and insurance authorization.   Follow Up Recommendations  Skilled nursing-short term rehab (<3 hours/day)     Assistance Recommended at Discharge  Frequent or constant Supervision/Assistance   Patient can return home with the following A lot of help with bathing/dressing/bathroom;A lot of help with walking and/or transfers;Direct supervision/assist for medications management;Assistance with cooking/housework;Direct supervision/assist for financial management;Assist for transportation    Functional Status Assessment  Patient has had a recent decline in their functional status and demonstrates the ability to make significant improvements in function in a reasonable and predictable amount of time.  Equipment Recommendations  None recommended by OT    Recommendations  for Other Services       Precautions / Restrictions Precautions Precautions: Fall Precaution Comments: bilateral mitts Restrictions Weight Bearing Restrictions: No      Mobility Bed Mobility Overal bed mobility: Needs Assistance Bed Mobility: Supine to Sit, Sit to Supine     Supine to sit: Mod assist, HOB elevated Sit to supine: Min assist   General bed mobility comments: mod A to initiate, mod A to elevate trunk and min A with LEs back onto bed    Transfers Overall transfer level: Needs assistance Equipment used: Rolling walker (2 wheels) Transfers: Sit to/from Stand                    Balance Overall balance assessment: Needs assistance Sitting-balance support: No upper extremity supported, Feet supported Sitting balance-Leahy Scale: Fair     Standing balance support: Bilateral upper extremity supported, Reliant on assistive device for balance Standing balance-Leahy Scale: Poor                             ADL either performed or assessed with clinical judgement   ADL Overall ADL's : Needs assistance/impaired Eating/Feeding: Supervision/ safety;Sitting   Grooming: Wash/dry hands;Wash/dry face;Brushing hair;Min guard;Sitting   Upper Body Bathing: Minimal assistance   Lower Body Bathing: Maximal assistance   Upper Body Dressing : Minimal assistance   Lower Body Dressing: Maximal assistance   Toilet Transfer: Moderate assistance;Stand-pivot;BSC/3in1;Cueing for sequencing;Cueing for safety   Toileting- Clothing Manipulation and Hygiene: Total assistance       Functional mobility during ADLs: Moderate assistance;Cueing for safety;Cueing for sequencing       Vision Baseline Vision/History: 1 Wears glasses Patient Visual Report: No change  from baseline       Perception     Praxis      Pertinent Vitals/Pain Pain Assessment Pain Assessment: No/denies pain Faces Pain Scale: No hurt     Hand Dominance Right   Extremity/Trunk  Assessment Upper Extremity Assessment Upper Extremity Assessment: Generalized weakness   Lower Extremity Assessment Lower Extremity Assessment: Defer to PT evaluation   Cervical / Trunk Assessment Cervical / Trunk Assessment: Kyphotic   Communication Communication Communication: Expressive difficulties;Receptive difficulties   Cognition Arousal/Alertness: Awake/alert Behavior During Therapy: Flat affect Overall Cognitive Status: No family/caregiver present to determine baseline cognitive functioning Area of Impairment: Attention, Following commands, Safety/judgement, Awareness, Problem solving                         Safety/Judgement: Decreased awareness of safety, Decreased awareness of deficits   Problem Solving: Slow processing, Decreased initiation General Comments: poor initiation, requires tactile cues and physical assistance to initiate all functional mobility tasks     General Comments       Exercises     Shoulder Instructions      Home Living Family/patient expects to be discharged to:: Private residence Living Arrangements: Spouse/significant other Available Help at Discharge: Family;Available 24 hours/day Type of Home: House Home Access: Level entry     Home Layout: One level     Bathroom Shower/Tub: Occupational psychologist: Standard Bathroom Accessibility: Yes   Home Equipment: Rollator (4 wheels)          Prior Functioning/Environment Prior Level of Function : Needs assist             Mobility Comments: ambulatory with rollator ADLs Comments: assistance with all ADLs/IADLs        OT Problem List: Decreased strength;Decreased activity tolerance;Decreased cognition;Decreased knowledge of use of DME or AE;Impaired balance (sitting and/or standing);Decreased coordination;Decreased safety awareness      OT Treatment/Interventions: Self-care/ADL training;Therapeutic exercise;Neuromuscular education;DME and/or AE  instruction;Therapeutic activities;Patient/family education;Balance training    OT Goals(Current goals can be found in the care plan section) Acute Rehab OT Goals Patient Stated Goal: none stated OT Goal Formulation: With patient Time For Goal Achievement: 09/01/22 Potential to Achieve Goals: Good ADL Goals Pt Will Perform Grooming: with min guard assist;with supervision;sitting Pt Will Perform Upper Body Bathing: with min guard assist;with supervision;sitting Pt Will Perform Upper Body Dressing: with min guard assist;with supervision;sitting Pt Will Transfer to Toilet: with min assist;stand pivot transfer;bedside commode Pt Will Perform Toileting - Clothing Manipulation and hygiene: with max assist;with mod assist;sit to/from stand  OT Frequency: Min 2X/week    Co-evaluation              AM-PAC OT "6 Clicks" Daily Activity     Outcome Measure Help from another person eating meals?: A Little Help from another person taking care of personal grooming?: A Little Help from another person toileting, which includes using toliet, bedpan, or urinal?: Total Help from another person bathing (including washing, rinsing, drying)?: A Lot Help from another person to put on and taking off regular upper body clothing?: A Little Help from another person to put on and taking off regular lower body clothing?: A Lot 6 Click Score: 14   End of Session Equipment Utilized During Treatment: Gait belt;Rolling walker (2 wheels)  Activity Tolerance: Patient limited by fatigue Patient left: in bed;with bed alarm set;with SCD's reapplied;Other (comment) (B mitts reapplied)  OT Visit Diagnosis: Other abnormalities of gait and mobility (R26.89);Unsteadiness on  feet (R26.81);Muscle weakness (generalized) (M62.81);Other symptoms and signs involving cognitive function                Time: [id number] OT Time Calculation (min): 25 min Charges:  OT General Charges $OT Visit: 1 Visit OT Evaluation $OT Eval  Moderate Complexity: 1 Mod OT Treatments $Self Care/Home Management : 8-22 mins    Britt Bottom 08/18/2022, 12:50 PM

## 2022-08-19 DIAGNOSIS — G9340 Encephalopathy, unspecified: Secondary | ICD-10-CM | POA: Diagnosis not present

## 2022-08-19 NOTE — Discharge Summary (Signed)
Physician Discharge Summary  Jeanette [name] [id number] DOB: 09-18-1934 DOA: 08/16/2022  PCP: Crist Infante, MD  Admit date: 08/16/2022 Discharge date: 08/19/2022  Admitted From: Home Disposition: Home  Recommendations for Outpatient Follow-up:  Follow up with PCP in 1-2 weeks to evaluate home medications  Home Health: None Equipment/Devices: None  Discharge Condition: Stable CODE STATUS: DNR Diet recommendation: Diabetic diet as tolerated  Brief/Interim Summary: 88 year old female with history of prior stroke requiring thrombectomy, paroxysmal atrial fibrillation who is currently being evaluated for pacemaker placement, recurrent UTI and recently treated with penicillin, prior history of anal cancer and breast cancer, chronic anemia and hypertension, memory loss brought to the emergency room with increasing confusion over the last 3 days. Husband at the bedside noticed worsening confusion which was pretty severe since last 3 days. Husband thought she might have taken an additional dose of gabapentin and Flexeril. No trauma. No other infections. In the emergency room hemodynamically stable. Patient was fidgety. CT scan negative. Admitted due to significant symptoms.   Admitted as above for acute metabolic encephalopathy found to have increased her home medications, taking medications that are not prescribed to her as well as taking medications that have been discontinued per discussion with pharmacy after medication review.  Lengthy discussion with patient about discontinuing these medications.  Recommend close follow-up with PCP within the next 24 to 48 hours with at least a phone call to discuss her current home medication regimen versus what medication she has on hand at home.  There appear to be numerous medications at home that she continues to take despite being discontinued on her home medicine list per pharmacy.  At this time patient is otherwise stable and agreeable for discharge home  and back to baseline per family.  Discharge Diagnoses:  Principal Problem:   Acute encephalopathy Active Problems:   ANEMIA   Essential hypertension   CVA (cerebral vascular accident) ([place])   H/O: stroke   Paroxysmal atrial flutter ([place])   Acute metabolic encephalopathy    Discharge Instructions  Discharge Instructions     Diet - low sodium heart healthy   Complete by: As directed    Discharge instructions   Complete by: As directed    Follow-up with your PCP tomorrow, please go over all your medications, discard any medications at home that are old or no longer prescribed to avoid confusion and accidental ingestion of medications/or overdose.  If your headaches are not controlled please discuss with your PCP rather than taking another person's medications.   Increase activity slowly   Complete by: As directed       Allergies as of 08/19/2022       Reactions   Epinephrine Anaphylaxis   Shortness of breath and increased heart rate   Cozaar [losartan] Other (See Comments)   increased air hunger and dizziness.   Crestor [rosuvastatin] Other (See Comments)   memory problems.   Ezetimibe Other (See Comments)   Galantamine Hydrobromide Other (See Comments)   wt loss and dizziness   Iron Diarrhea   Pregabalin Other (See Comments)   Septra [sulfamethoxazole-trimethoprim] Other (See Comments)   2017 had a strange reaction she says.   Vytorin [ezetimibe-simvastatin] Other (See Comments)   unknown   Benazepril Hcl Cough   Crestor [rosuvastatin Calcium] Other (See Comments)   cognitive   Lisinopril Cough        Medication List     TAKE these medications    acetaminophen 650 MG CR tablet Commonly known as: TYLENOL Take  650 mg by mouth 2 (two) times daily.   amiodarone 200 MG tablet Commonly known as: PACERONE Take by mouth.   apixaban 5 MG Tabs tablet Commonly known as: ELIQUIS Take 1 tablet (5 mg total) by mouth 2 (two) times daily.   escitalopram 10 MG  tablet Commonly known as: LEXAPRO Take 10 mg by mouth daily.   metoprolol 200 MG 24 hr tablet Commonly known as: TOPROL-XL Take 100 mg by mouth 2 (two) times daily.   nitroGLYCERIN 0.4 MG SL tablet Commonly known as: NITROSTAT PLACE 1 TABLET (0.4 MG TOTAL) UNDER THE TONGUE EVERY 5 (FIVE) MINUTES AS NEEDED FOR CHEST PAIN.   olmesartan 20 MG tablet Commonly known as: BENICAR Take 10 mg by mouth daily.   Repatha SureClick [id number] MG/ML Soaj Generic drug: Evolocumab Inject 140 mg into the skin See admin instructions. Take 113m SQ every two weeks per patient        Allergies  Allergen Reactions   Epinephrine Anaphylaxis    Shortness of breath and increased heart rate   Cozaar [Losartan] Other (See Comments)    increased air hunger and dizziness.   Crestor [Rosuvastatin] Other (See Comments)    memory problems.   Ezetimibe Other (See Comments)   Galantamine Hydrobromide Other (See Comments)    wt loss and dizziness   Iron Diarrhea   Pregabalin Other (See Comments)   Septra [Sulfamethoxazole-Trimethoprim] Other (See Comments)    2017 had a strange reaction she says.   Vytorin [Ezetimibe-Simvastatin] Other (See Comments)    unknown   Benazepril Hcl Cough   Crestor [Rosuvastatin Calcium] Other (See Comments)    cognitive   Lisinopril Cough    Consultations: Neuro   Procedures/Studies: MR BRAIN WO CONTRAST  Result Date: 08/18/2022 CLINICAL DATA:  Neuro deficit, acute, stroke suspected EXAM: MRI HEAD WITHOUT CONTRAST TECHNIQUE: Multiplanar, multiecho pulse sequences of the brain and surrounding structures were obtained without intravenous contrast. COMPARISON:  CT head August 16, 2022.  MRI head August 24, 2020. FINDINGS: Brain: No acute infarction, hemorrhage, hydrocephalus, extra-axial collection or mass lesion. Remote infarcts including left parietal remote infarct and small cortical infarcts in both occipital lobes. Additional T2/FLAIR hyperintensity within the white  matter, nonspecific but compatible with chronic microvascular ischemic disease. Vascular: Major arterial flow voids are maintained at the skull base. Skull and upper cervical spine: Normal marrow signal. Sinuses/Orbits: Clear sinuses.  No acute orbital findings. Other: No mastoid effusions. IMPRESSION: 1. No evidence of acute intracranial abnormality. 2. Remote infarcts and chronic microvascular ischemic disease. Electronically Signed   By: FMargaretha SheffieldM.D.   On: 08/18/2022 14:02   EEG adult  Result Date: 08/17/2022 YLora Havens MD     08/17/2022 12:20 PM Patient Name: EAHLIA SCULLINMRN: [id number] Attending: PLora HavensReferring Physician/Provider: KRise Patience MD Date: 08/17/2022 Duration: 27.06 mins Patient history: 89yoF with ams. EEG to evaluate for seizure Level of alertness: Awake AEDs during EEG study: None Technical aspects: This EEG study was done with scalp electrodes positioned according to the 10-20 International system of electrode placement. Electrical activity was reviewed with band pass filter of 1-70Hz$ , sensitivity of 7 uV/mm, display speed of 331msec with a 60Hz$  notched filter applied as appropriate. EEG data were recorded continuously and digitally stored.  Video monitoring was available and reviewed as appropriate. Description: The posterior dominant rhythm consists of 8 Hz activity of moderate voltage (25-35 uV) seen predominantly in posterior head regions, symmetric and reactive to eye opening and  eye closing. EEG showed continuous low amplitude 2-3hz$  delta slowing in right hemisphere as well as intermittent 3-6hz$  theta-delta slowing in left hemisphere. Hyperventilation and photic stimulation were not performed.   ABNORMALITY - Continuous slow, right hemisphere - Intermittent slow, left hemisphere IMPRESSION: This study is is suggestive of cortical dysfunction arising from right and left hemisphere likely secondary to underlying structural abnormality. No  seizures or epileptiform discharges were seen throughout the recording. Lora Havens   DG CHEST PORT 1 VIEW  Result Date: 08/17/2022 CLINICAL DATA:  88 year old female with shortness of breath and altered mental status. EXAM: PORTABLE CHEST 1 VIEW COMPARISON:  Portable chest 04/21/2022 and earlier. FINDINGS: Portable AP supine view at 0512 hours. Mildly lower lung volumes. Mediastinal contours are stable, no cardiomegaly. Visualized tracheal air column is within normal limits. Lung markings are stable. Allowing for portable technique the lungs are clear. No pneumothorax or pleural effusion. Negative visible bowel gas. No acute osseous abnormality identified. IMPRESSION: No acute cardiopulmonary abnormality. Electronically Signed   By: Genevie Ann M.D.   On: 08/17/2022 05:18   CT Head Wo Contrast  Result Date: 08/16/2022 CLINICAL DATA:  Increasing confusion for 2 days, altered level of consciousness EXAM: CT HEAD WITHOUT CONTRAST TECHNIQUE: Contiguous axial images were obtained from the base of the skull through the vertex without intravenous contrast. RADIATION DOSE REDUCTION: This exam was performed according to the departmental dose-optimization program which includes automated exposure control, adjustment of the mA and/or kV according to patient size and/or use of iterative reconstruction technique. COMPARISON:  12/05/2019 FINDINGS: Brain: No acute infarct or hemorrhage. Stable left frontotemporal encephalomalacia related to prior infarct. Lateral ventricles and midline structures are stable. No acute extra-axial fluid collections. No mass effect. Vascular: No hyperdense vessel or unexpected calcification. Skull: Normal. Negative for fracture or focal lesion. Sinuses/Orbits: No acute finding. Other: None. IMPRESSION: 1. No acute intracranial process. Electronically Signed   By: Randa Ngo M.D.   On: 08/16/2022 19:17   PCV CAROTID DUPLEX (BILATERAL)  Result Date: 07/26/2022 Carotid artery duplex  07/26/2022: The bifurcation, internal, external and common carotid arteries reveal no evidence of significant stenosis, bilaterally. No significant plaque burden noted. Antegrade right vertebral artery flow. Antegrade left vertebral artery flow.     Subjective: No acute issues/events overnight - back to baseline today - ambulating with PT - now recommending HHPT   Discharge Exam: Vitals:   08/18/22 2339 08/19/22 0317  BP: (!) 158/68 (!) 153/73  Pulse: 86 86  Resp:  16  Temp:  98.2 F (36.8 C)  SpO2:  95%   Vitals:   08/18/22 1933 08/18/22 2321 08/18/22 2339 08/19/22 0317  BP: (!) 155/70 (!) 156/139 (!) 158/68 (!) 153/73  Pulse: 87 87 86 86  Resp: 20 16  16  $ Temp: 98.1 F (36.7 C) 98.5 F (36.9 C)  98.2 F (36.8 C)  TempSrc: Oral Oral  Oral  SpO2: 96% 98%  95%  Weight:      Height:        General: Pt is alert, awake, not in acute distress Cardiovascular: RRR, S1/S2 +, no rubs, no gallops Respiratory: CTA bilaterally, no wheezing, no rhonchi Abdominal: Soft, NT, ND, bowel sounds + Extremities: no edema, no cyanosis    The results of significant diagnostics from this hospitalization (including imaging, microbiology, ancillary and laboratory) are listed below for reference.     Microbiology: Recent Results (from the past 240 hour(s))  Resp panel by RT-PCR (RSV, Flu A&B, Covid) Anterior Nasal Swab  Status: None   Collection Time: 08/17/22  5:08 AM   Specimen: Anterior Nasal Swab  Result Value Ref Range Status   SARS Coronavirus 2 by RT PCR NEGATIVE NEGATIVE Final   Influenza A by PCR NEGATIVE NEGATIVE Final   Influenza B by PCR NEGATIVE NEGATIVE Final    Comment: (NOTE) The Xpert Xpress SARS-CoV-2/FLU/RSV plus assay is intended as an aid in the diagnosis of influenza from Nasopharyngeal swab specimens and should not be used as a sole basis for treatment. Nasal washings and aspirates are unacceptable for Xpert Xpress SARS-CoV-2/FLU/RSV testing.  Fact Sheet for  Patients: EntrepreneurPulse.com.au  Fact Sheet for Healthcare Providers: IncredibleEmployment.be  This test is not yet approved or cleared by the [place] FDA and has been authorized for detection and/or diagnosis of SARS-CoV-2 by FDA under an Emergency Use Authorization (EUA). This EUA will remain in effect (meaning this test can be used) for the duration of the COVID-19 declaration under Section 564(b)(1) of the Act, 21 U.S.C. section 360bbb-3(b)(1), unless the authorization is terminated or revoked.     Resp Syncytial Virus by PCR NEGATIVE NEGATIVE Final    Comment: (NOTE) Fact Sheet for Patients: EntrepreneurPulse.com.au  Fact Sheet for Healthcare Providers: IncredibleEmployment.be  This test is not yet approved or cleared by the [place] FDA and has been authorized for detection and/or diagnosis of SARS-CoV-2 by FDA under an Emergency Use Authorization (EUA). This EUA will remain in effect (meaning this test can be used) for the duration of the COVID-19 declaration under Section 564(b)(1) of the Act, 21 U.S.C. section 360bbb-3(b)(1), unless the authorization is terminated or revoked.  Performed at [facility], [place] [address], [place], [place] [address]      Labs: BNP (last 3 results) Recent Labs    04/10/22 1044  BNP [id number]   Basic Metabolic Panel: Recent Labs  Lab 08/16/22 1538 08/17/22 0414  NA 138 134*  K 3.7 3.1*  CL 100 96*  CO2 30 30  GLUCOSE 110* 110*  BUN 12 8  CREATININE 0.78 0.56  CALCIUM 11.3* 10.3  MG 2.0  --    Liver Function Tests: Recent Labs  Lab 08/16/22 1538 08/17/22 0414  AST 19 16  ALT 6 7  ALKPHOS 46 46  BILITOT 1.0 0.7  PROT 7.0 6.5  ALBUMIN 3.7 3.4*   No results for input(s): "LIPASE", "AMYLASE" in the last 168 hours. Recent Labs  Lab 08/16/22 2213  AMMONIA 19   CBC: Recent Labs  Lab 08/16/22 1538 08/17/22 0414  WBC 7.1 5.7   NEUTROABS 4.2 3.6  HGB 11.7* 10.7*  HCT 36.4 32.7*  MCV 98.1 96.2  PLT 230 199   Cardiac Enzymes: No results for input(s): "CKTOTAL", "CKMB", "CKMBINDEX", "TROPONINI" in the last 168 hours. BNP: Invalid input(s): "POCBNP" CBG: No results for input(s): "GLUCAP" in the last 168 hours. D-Dimer No results for input(s): "DDIMER" in the last 72 hours. Hgb A1c No results for input(s): "HGBA1C" in the last 72 hours. Lipid Profile No results for input(s): "CHOL", "HDL", "LDLCALC", "TRIG", "CHOLHDL", "LDLDIRECT" in the last 72 hours. Thyroid function studies Recent Labs    08/16/22 2212  TSH 2.024   Anemia work up Recent Labs    08/16/22 2213  VITAMINB12 255   Urinalysis    Component Value Date/Time   COLORURINE YELLOW 08/16/2022 2205   APPEARANCEUR HAZY (A) 08/16/2022 2205   LABSPEC 1.017 08/16/2022 2205   PHURINE 6.0 08/16/2022 2205   GLUCOSEU NEGATIVE 08/16/2022 2205   HGBUR  MODERATE (A) 08/16/2022 2205   BILIRUBINUR NEGATIVE 08/16/2022 2205   KETONESUR 20 (A) 08/16/2022 2205   PROTEINUR NEGATIVE 08/16/2022 2205   UROBILINOGEN 0.2 03/14/2009 1400   NITRITE NEGATIVE 08/16/2022 2205   LEUKOCYTESUR NEGATIVE 08/16/2022 2205   Sepsis Labs Recent Labs  Lab 08/16/22 1538 08/17/22 0414  WBC 7.1 5.7   Microbiology Recent Results (from the past 240 hour(s))  Resp panel by RT-PCR (RSV, Flu A&B, Covid) Anterior Nasal Swab     Status: None   Collection Time: 08/17/22  5:08 AM   Specimen: Anterior Nasal Swab  Result Value Ref Range Status   SARS Coronavirus 2 by RT PCR NEGATIVE NEGATIVE Final   Influenza A by PCR NEGATIVE NEGATIVE Final   Influenza B by PCR NEGATIVE NEGATIVE Final    Comment: (NOTE) The Xpert Xpress SARS-CoV-2/FLU/RSV plus assay is intended as an aid in the diagnosis of influenza from Nasopharyngeal swab specimens and should not be used as a sole basis for treatment. Nasal washings and aspirates are unacceptable for Xpert Xpress  SARS-CoV-2/FLU/RSV testing.  Fact Sheet for Patients: EntrepreneurPulse.com.au  Fact Sheet for Healthcare Providers: IncredibleEmployment.be  This test is not yet approved or cleared by the [place] FDA and has been authorized for detection and/or diagnosis of SARS-CoV-2 by FDA under an Emergency Use Authorization (EUA). This EUA will remain in effect (meaning this test can be used) for the duration of the COVID-19 declaration under Section 564(b)(1) of the Act, 21 U.S.C. section 360bbb-3(b)(1), unless the authorization is terminated or revoked.     Resp Syncytial Virus by PCR NEGATIVE NEGATIVE Final    Comment: (NOTE) Fact Sheet for Patients: EntrepreneurPulse.com.au  Fact Sheet for Healthcare Providers: IncredibleEmployment.be  This test is not yet approved or cleared by the [place] FDA and has been authorized for detection and/or diagnosis of SARS-CoV-2 by FDA under an Emergency Use Authorization (EUA). This EUA will remain in effect (meaning this test can be used) for the duration of the COVID-19 declaration under Section 564(b)(1) of the Act, 21 U.S.C. section 360bbb-3(b)(1), unless the authorization is terminated or revoked.  Performed at [facility], [place] [address], [place], [place] [address]      Time coordinating discharge: Over 30 minutes  SIGNED:   Little Ishikawa, DO [facility] 08/19/2022, 11:34 AM Pager   If 7PM-7AM, please contact night-coverage [email or web address]

## 2022-08-19 NOTE — Progress Notes (Signed)
Patient ready for discharge to home; discharge instructions given and reviewed; no new RX's; husband present to transport home;patient discharged out via wheelchair.

## 2022-08-19 NOTE — Progress Notes (Signed)
Physical Therapy Treatment Patient Details Name: Jeanette [name] MRN: [id number] DOB: 03/20/1935 Today's Date: 08/19/2022   History of Present Illness 88 y.o. female presents to [facility] hospital on 08/16/2022 with confusion. PMH includes afib, anal cancer, breast cancer, HLD, HTN, PVD, CVA.    PT Comments    Pt progressing well and demonstrates significant improvement both cognitively and functionally. Pt functioning at min guard for transfers and minA for ambulation with RW. Pt's spouse present and reports her to be "way better" and close to her baseline. Pt was able to amb 100' with RW with minA today. Pt with good home set up and support and required some assist for ADLs and mobility at baseline. Pt to benefit from HHPT to improve balance, strength, and decrease falls risk. Acute PT to cont to follow.    Recommendations for follow up therapy are one component of a multi-disciplinary discharge planning process, led by the attending physician.  Recommendations may be updated based on patient status, additional functional criteria and insurance authorization.  Follow Up Recommendations  Home health PT     Assistance Recommended at Discharge Frequent or constant Supervision/Assistance  Patient can return home with the following Assistance with cooking/housework;Direct supervision/assist for medications management;Direct supervision/assist for financial management;Assist for transportation;Help with stairs or ramp for entrance;A little help with walking and/or transfers;A little help with bathing/dressing/bathroom   Equipment Recommendations       Recommendations for Other Services       Precautions / Restrictions Precautions Precautions: Fall Restrictions Weight Bearing Restrictions: No     Mobility  Bed Mobility               General bed mobility comments: pt sitting up in the chair upon PT arrival    Transfers Overall transfer level: Needs assistance Equipment used: Rolling  walker (2 wheels) Transfers: Sit to/from Stand Sit to Stand: Min guard           General transfer comment: verbal cues for safe hand placement, increased time, no physical assist to power up    Ambulation/Gait Ambulation/Gait assistance: Min assist Gait Distance (Feet): 100 Feet Assistive device: Rolling walker (2 wheels) Gait Pattern/deviations: Step-through pattern, Decreased stride length, Narrow base of support Gait velocity: dec     General Gait Details: pt with noted increased dependence on UEs, pt with very narrow BOS, near crossover gait pattern, minA for walker management around obstacles. per spouse "she is a little more unsteady from her normal"   Marine scientist Rankin (Stroke Patients Only) Modified Rankin (Stroke Patients Only) Pre-Morbid Rankin Score: Moderately severe disability Modified Rankin: Moderately severe disability     Balance Overall balance assessment: Needs assistance Sitting-balance support: No upper extremity supported, Feet supported Sitting balance-Leahy Scale: Fair     Standing balance support: Bilateral upper extremity supported, Reliant on assistive device for balance Standing balance-Leahy Scale: Fair Standing balance comment: leans against sink to wash hands                            Cognition Arousal/Alertness: Awake/alert Behavior During Therapy: WFL for tasks assessed/performed Overall Cognitive Status: History of cognitive impairments - at baseline                                 General Comments: pt mildly  confused/poor memory at baseline. Pt unaware of date but was able to provide accurate report of home set up. Spouse present and states "this is her normal". pt participatory, follow commands, and was pleasant. Pt with noted delayed processing and impaired problem solving, suspect this to be baseline as well        Exercises      General Comments  General comments (skin integrity, edema, etc.): fragile skin, multiple bruises on forearms, assisted pt to the bathroom, minA to power up from commode, supervision for hygiene      Pertinent Vitals/Pain Pain Assessment Pain Assessment: No/denies pain    Home Living                          Prior Function            PT Goals (current goals can now be found in the care plan section) Acute Rehab PT Goals PT Goal Formulation: With family Time For Goal Achievement: 08/31/22 Potential to Achieve Goals: Fair Progress towards PT goals: Progressing toward goals    Frequency    Min 3X/week      PT Plan Discharge plan needs to be updated;Frequency needs to be updated    Co-evaluation              AM-PAC PT "6 Clicks" Mobility   Outcome Measure  Help needed turning from your back to your side while in a flat bed without using bedrails?: A Little Help needed moving from lying on your back to sitting on the side of a flat bed without using bedrails?: A Little Help needed moving to and from a bed to a chair (including a wheelchair)?: A Little Help needed standing up from a chair using your arms (e.g., wheelchair or bedside chair)?: A Little Help needed to walk in hospital room?: A Lot Help needed climbing 3-5 steps with a railing? : A Lot 6 Click Score: 16    End of Session Equipment Utilized During Treatment: Gait belt Activity Tolerance: Patient tolerated treatment well Patient left: with call bell/phone within reach;with family/visitor present;in chair;with nursing/sitter in room Nurse Communication: Mobility status PT Visit Diagnosis: Other abnormalities of gait and mobility (R26.89);Muscle weakness (generalized) (M62.81);Other symptoms and signs involving the nervous system (R29.898)     Time: [id number] PT Time Calculation (min) (ACUTE ONLY): 16 min  Charges:  $Gait Training: 8-22 mins                     Kittie Plater, PT, DPT Acute Rehabilitation  Services Secure chat preferred Office #: [phone]    Berline Lopes 08/19/2022, 2:30 PM

## 2022-08-19 NOTE — TOC Transition Note (Signed)
Transition of Care [facility]) - CM/SW Discharge Note   Patient Details  Name: Jeanette [name] MRN: [id number] Date of Birth: Dec 10, 1934  [facility] CM/SW Contact:  Carles Collet, RN Phone Number: 08/19/2022, 2:53 PM   Clinical Narrative:     Confirmed w patient and spouse that they would like to DC to home. Spouse states no preference for [facility] provider. Alvis Lemmings accepted referral.  Spouse declined WC, and states patient has RW at home. Spouse will provide transportation home Verified PCP  Final next level of care: Home w Home Health Services Barriers to Discharge: No Barriers Identified   Patient Goals and CMS Choice CMS Medicare.gov Compare Post Acute Care list provided to:: Other (Comment Required) Choice offered to / list presented to : Spouse  Discharge Placement                         Discharge Plan and Services Additional resources added to the After Visit Summary for                  DME Arranged: N/A         HH Arranged: PT HH Agency: [place] Date [place]: 08/19/22 Time [place]: [id number] Representative spoke with at [place]: [place] Determinants of Health ([place]) Interventions SDOH Screenings   Tobacco Use: Low Risk  (08/17/2022)     Readmission Risk Interventions     No data to display

## 2022-08-19 NOTE — TOC Initial Note (Signed)
Transition of Care [facility]) - Initial/Assessment Note    Patient Details  Name: Jeanette [name] MRN: [id number] Date of Birth: 06/12/1935  Transition of Care [facility]) CM/SW Contact:    Geralynn Ochs, LCSW Phone Number: 08/19/2022, 12:53 PM  Clinical Narrative:        CSW spoke with patient's spouse about recommendation for SNF. Spouse said CSW should ask patient, and CSW discussed patient's confusion, but patient adamant that she isn't confused. Spouse said patient is much better. Patient and spouse both engaged in conversation, both refusing SNF. Spouse says he already helps the patient with bathing and dressing, and they have an aide that comes in to help with cleaning and cooking. CSW asked if spouse had seen patient move around to ensure he could provide needed assist, and he has not. CSW asked RN about mobilizing patient or finding therapy to work with patient while spouse was present to ensure he could provide needed assist. Spouse requesting outpatient therapy instead of SNF, they are familiar with location on [address] that the patient has been to before. CSW to follow.           Expected Discharge Plan: OP Rehab Barriers to Discharge: Continued Medical Work up   Patient Goals and CMS Choice Patient states their goals for this hospitalization and ongoing recovery are:: patient unable to participate in goal setting, not fully oriented CMS Medicare.gov Compare Post Acute Care list provided to:: Patient Represenative (must comment) Choice offered to / list presented to : [place] ownership interest in [facility].provided to:: Spouse    Expected Discharge Plan and Services       Living arrangements for the past 2 months: Single Family Home Expected Discharge Date: 08/19/22                                    Prior Living Arrangements/Services Living arrangements for the past 2 months: Single Family Home Lives with:: Spouse Patient language and need  for interpreter reviewed:: No        Need for Family Participation in Patient Care: Yes (Comment) Care giver support system in place?: Yes (comment)   Criminal Activity/Legal Involvement Pertinent to Current Situation/Hospitalization: No - Comment as needed  Activities of Daily Living      Permission Sought/Granted Permission sought to share information with : Family Supports Permission granted to share information with : Yes, Verbal Permission Granted  Share Information with NAME: Delfino Lovett     Permission granted to share info w Relationship: Spouse     Emotional Assessment   Attitude/Demeanor/Rapport: Engaged Affect (typically observed): Appropriate Orientation: : Oriented to Self, Oriented to Place, Oriented to  Time Alcohol / Substance Use: Not Applicable Psych Involvement: No (comment)  Admission diagnosis:  Confusion [R41.0] Acute encephalopathy [id number] Acute metabolic encephalopathy 99991111 Patient Active Problem List   Diagnosis Date Noted   Acute metabolic encephalopathy [id number]   Acute encephalopathy 08/16/2022   Paroxysmal atrial flutter ([place]) 05/25/2022   Atypical atrial flutter (HCC)    Ischemic stroke ([place]) 12/04/2019   Chronic diastolic CHF (congestive heart failure) ([place]) 12/04/2019   Vertebral artery stenosis with cerebral infarction ([place]) 12/04/2019   Stroke (cerebrum) ([place]) 12/04/2019   Chest pressure 03/06/2019   H/O: stroke 03/06/2019   CVA (cerebral vascular accident) ([place]) 03/15/2018   Paroxysmal atrial fibrillation ([place]) 03/15/2018   Aphasia    Mixed hyperlipidemia    Bad headache  Renal artery stenosis ([place]) 10/21/2013   Essential hypertension 10/21/2013   PALPITATIONS, RECURRENT 06/22/2009   STRESS ELECTROCARDIOGRAM, ABNORMAL 06/22/2009   SHORTNESS OF BREATH 05/25/2009   DYSLIPIDEMIA 03/21/2009   ANEMIA 03/21/2009   CEREBRAL EMBOLISM WITH CEREBRAL INFARCTION 03/21/2009   DEGENERATIVE [place] DISEASE 03/21/2009   CHEST  PAIN-UNSPECIFIED 03/21/2009   PCP:  Crist Infante, MD Pharmacy:   [id number] [place], [place] - [address] [place] [address]Phone: [phone]: [phone]    Social Determinants of Health (SDOH) Social History: SDOH Screenings   Tobacco Use: Low Risk  (08/17/2022)   SDOH Interventions:     Readmission Risk Interventions     No data to display

## 2022-08-19 NOTE — Progress Notes (Signed)
SATURATION QUALIFICATIONS: (This note is used to comply with regulatory documentation for home oxygen)  Patient Saturations on Room Air at Rest = 98%  Patient Saturations on Room Air while Ambulating = 98%  Patient Saturations on 0 Liters of oxygen while Ambulating = 99%  Please briefly explain why patient needs home oxygen:

## 2022-08-22 NOTE — Progress Notes (Unsigned)
Follow up visit  Subjective:   Jeanette [name], female    DOB: 1934-11-18, 88 y.o.   MRN: [id number]   HPI  No chief complaint on file.   88 y.o. Caucasian female with hypertension, paroxysmal atrial fibrillation, h/o stroke  Patient stopped multaq due to nausea symptoms. She was then started on amiodarone 200 mg bidX1 week, then to be reduced to 200 mg daily. However, she did not start amiodarone due to concerns of side effects.  She feels that her heart rate is still irregular at times.  Patient was recently hospitalized with acute metabolic encephalopathy, thought to be related to medications.  Prior to this episode, she was evaluated by EP Dr. Caryl Comes who had recommended  The patient has posttermination pauses of greater than 6 seconds.  And prior to being more aggressive with control of her tachycardia, I think prevention of further bradycardia arrhythmias would be the high priority, this will require pacing. The benefits and risks were reviewed including but not limited to death,  perforation, infection, lead dislodgement and device malfunction.  The patient understands agrees and is willing to proceed.  We will wait to hear from urology however in the context of ongoing possibly kidney infection prior to implanting the device; she has had no syncope so hopefully we will get in soon enough   Following pacing, we can use the data from the device to determine ventricular response and antiarrhythmic therapies can be considered.  She is averse to amiodarone but we did discuss the potential role.  She has no known coronary disease and she could also be treated potentially with a 1C   Orthostatic hypotension is profound.  We discussed the physiology.  Suggested compression as opposed to pharmacological therapy to start.  Compression of thighs and/or abdomen.  Moreover, suggested she raise the head of her bed 4 inches as her a.m. arising has been her most treacherous time.   Not withstanding  her supine hypertension, would not treat it given the profound greater than 60 mm drop.  Interestingly, there was no change in heart rate which would suggest autonomic failure.  She has some degree of dry mouth, but does not have constipation and/or dry eyes.  Has a history of anal cancer and intermittent diarrhea but I would think that she will still have constipation if this were Shy-Drager syndrome however, the lack of heart rate response does make the concern.    Current Outpatient Medications:    acetaminophen (TYLENOL) 650 MG CR tablet, Take 650 mg by mouth 2 (two) times daily., Disp: , Rfl:    apixaban (ELIQUIS) 5 MG TABS tablet, Take 1 tablet (5 mg total) by mouth 2 (two) times daily., Disp: 60 tablet, Rfl: 1   escitalopram (LEXAPRO) 10 MG tablet, Take 10 mg by mouth daily., Disp: , Rfl:    metoprolol (TOPROL-XL) 200 MG 24 hr tablet, Take 100 mg by mouth 2 (two) times daily., Disp: , Rfl:    nitroGLYCERIN (NITROSTAT) 0.4 MG SL tablet, PLACE 1 TABLET (0.4 MG TOTAL) UNDER THE TONGUE EVERY 5 (FIVE) MINUTES AS NEEDED FOR CHEST PAIN., Disp: 75 tablet, Rfl: 1   olmesartan (BENICAR) 20 MG tablet, Take 10 mg by mouth daily., Disp: , Rfl:    REPATHA SURECLICK [id number] MG/ML SOAJ, Inject 140 mg into the skin See admin instructions. Take 110m SQ every two weeks per patient, Disp: , Rfl:    Cardiovascular & other pertient studies:  Reviewed external labs and tests, independently interpreted  Mobile cardiac telemetry 14 days 04/16/2022 - 04/30/2022: Dominant rhythm: Sinus. HR 58-71 bpm. Avg HR 96 bpm, in sinus rhythm. 163 episodes of probable atrial tachycardia, fastest at 214 bpm for 4 beats, longest for 12.5 secs at 112 bpm. Atrial fibrillation/flutter occurred with 36% burden. Ventricular rate 49-207 bpm, avg rate 139 bpm. <1% isolated SVE, couplet/triplets. 1 episode of VT, fastest at 167 bpm for 9 beats. <% isolated VE, couplets. 6.5 sec sinus pause on 10/14 4:18 PM (Correlated with electrical  cardioversion). No high grade AV block. 1 patient triggered event correlated with SVE.    EKG 05/25/2022: Sinus rhythm 57 bpm Nonspecific T-abnormality  Recent labs: 04/26/2022: Glucose 30, BUN/Cr 8/0.64. EGFR >60. Na/K 140/4.0. Rest of the CMP normal H/H 10/32. MCV 98. Platelets 243 TSH 3.1 normal  04/10/2022: Glucose 110, BUN/Cr 8/0.66. EGFR >60. Na/K 142/3.8. BNP 573 H/H 11.7/38.6. MCV 101. Platelets 270   Review of Systems  Cardiovascular:  Positive for palpitations (Currently resolved). Negative for chest pain, dyspnea on exertion, leg swelling and syncope.         There were no vitals filed for this visit.   There is no height or weight on file to calculate BMI. There were no vitals filed for this visit.    Objective:   Physical Exam Vitals and nursing note reviewed.  Constitutional:      General: She is not in acute distress. Neck:     Vascular: No JVD.  Cardiovascular:     Rate and Rhythm: Normal rate and regular rhythm.     Heart sounds: Normal heart sounds. No murmur heard. Pulmonary:     Effort: Pulmonary effort is normal.     Breath sounds: Normal breath sounds. No wheezing or rales.  Musculoskeletal:     Right lower leg: No edema.     Left lower leg: No edema.             Visit diagnoses: No diagnosis found.    No orders of the defined types were placed in this encounter.      Assessment & Recommendations:   88 y.o. Caucasian female with hypertension, paroxysmal atrial fibrillation, h/o stroke  Paroxysmal AFIb/flutter: Did not tolerate Multaq. Does not want to use amiodarone. While she is in sinus rhythm today, recent ZioPatch showed 36% Afib/flutter burden. Recommend referral to EP. CHA2DS2VASc score 6, annual stroke risk 8% On Eliquis 5 mg bid  H/o stroke: Patient is concerned about a "whoosh sound" and is concerned about carotid bruit. Given h/o stroke, will check carotid US.  F/u in 3 months    Nigel Mormon, MD Pager: [phone] Office: [phone]

## 2022-08-23 ENCOUNTER — Ambulatory Visit: Payer: Medicare Other | Admitting: Cardiology

## 2022-08-23 ENCOUNTER — Encounter: Payer: Self-pay | Admitting: Cardiology

## 2022-08-23 VITALS — BP 150/72 | HR 57 | Resp 16 | Ht 63.0 in | Wt 125.0 lb

## 2022-08-23 DIAGNOSIS — I455 Other specified heart block: Secondary | ICD-10-CM | POA: Insufficient documentation

## 2022-08-23 DIAGNOSIS — I4892 Unspecified atrial flutter: Secondary | ICD-10-CM

## 2022-10-25 ENCOUNTER — Other Ambulatory Visit: Payer: Self-pay | Admitting: Physician Assistant

## 2022-10-25 DIAGNOSIS — D500 Iron deficiency anemia secondary to blood loss (chronic): Secondary | ICD-10-CM

## 2022-10-26 ENCOUNTER — Inpatient Hospital Stay (HOSPITAL_BASED_OUTPATIENT_CLINIC_OR_DEPARTMENT_OTHER): Payer: Medicare Other | Admitting: Physician Assistant

## 2022-10-26 ENCOUNTER — Other Ambulatory Visit: Payer: Self-pay

## 2022-10-26 ENCOUNTER — Telehealth: Payer: Self-pay

## 2022-10-26 ENCOUNTER — Inpatient Hospital Stay: Payer: Medicare Other | Attending: Physician Assistant

## 2022-10-26 VITALS — BP 130/57 | HR 64 | Temp 97.7°F | Resp 14 | Wt 126.0 lb

## 2022-10-26 DIAGNOSIS — D509 Iron deficiency anemia, unspecified: Secondary | ICD-10-CM | POA: Diagnosis present

## 2022-10-26 DIAGNOSIS — D500 Iron deficiency anemia secondary to blood loss (chronic): Secondary | ICD-10-CM | POA: Diagnosis not present

## 2022-10-26 DIAGNOSIS — D638 Anemia in other chronic diseases classified elsewhere: Secondary | ICD-10-CM | POA: Diagnosis not present

## 2022-10-26 LAB — CMP (CANCER CENTER ONLY)
ALT: 6 U/L (ref 0–44)
AST: 11 U/L — ABNORMAL LOW (ref 15–41)
Albumin: 3.8 g/dL (ref 3.5–5.0)
Alkaline Phosphatase: 68 U/L (ref 38–126)
Anion gap: 2 — ABNORMAL LOW (ref 5–15)
BUN: 11 mg/dL (ref 8–23)
CO2: 32 mmol/L (ref 22–32)
Calcium: 11.2 mg/dL — ABNORMAL HIGH (ref 8.9–10.3)
Chloride: 106 mmol/L (ref 98–111)
Creatinine: 0.71 mg/dL (ref 0.44–1.00)
GFR, Estimated: >60 mL/min (ref >60)
Glucose, Bld: 83 mg/dL (ref 70–99)
Potassium: 3.9 mmol/L (ref 3.5–5.1)
Sodium: 140 mmol/L (ref 135–145)
Total Bilirubin: 0.4 mg/dL (ref 0.3–1.2)
Total Protein: 6.8 g/dL (ref 6.5–8.1)

## 2022-10-26 LAB — CBC WITH DIFFERENTIAL (CANCER CENTER ONLY)
Abs Immature Granulocytes: 0.03 10*3/uL (ref 0.00–0.07)
Basophils Absolute: 0 10*3/uL (ref 0.0–0.1)
Basophils Relative: 0 %
Eosinophils Absolute: 0 10*3/uL (ref 0.0–0.5)
Eosinophils Relative: 1 %
HCT: 36.9 % (ref 36.0–46.0)
Hemoglobin: 11.7 g/dL — ABNORMAL LOW (ref 12.0–15.0)
Immature Granulocytes: 1 %
Lymphocytes Relative: 25 %
Lymphs Abs: 1.2 10*3/uL (ref 0.7–4.0)
MCH: 31.8 pg (ref 26.0–34.0)
MCHC: 31.7 g/dL (ref 30.0–36.0)
MCV: 100.3 fL — ABNORMAL HIGH (ref 80.0–100.0)
Monocytes Absolute: 1 10*3/uL (ref 0.1–1.0)
Monocytes Relative: 21 %
Neutro Abs: 2.5 10*3/uL (ref 1.7–7.7)
Neutrophils Relative %: 52 %
Platelet Count: 206 10*3/uL (ref 150–400)
RBC: 3.68 MIL/uL — ABNORMAL LOW (ref 3.87–5.11)
RDW: 13.4 % (ref 11.5–15.5)
WBC Count: 4.8 10*3/uL (ref 4.0–10.5)
nRBC: 0 % (ref 0.0–0.2)

## 2022-10-26 LAB — IRON AND IRON BINDING CAPACITY (CC-WL,HP ONLY)
Iron: 83 ug/dL (ref 28–170)
Saturation Ratios: 31 % (ref 10.4–31.8)
TIBC: 269 ug/dL (ref 250–450)
UIBC: 186 ug/dL (ref 148–442)

## 2022-10-26 LAB — FERRITIN: Ferritin: 94 ng/mL (ref 11–307)

## 2022-10-26 NOTE — Telephone Encounter (Signed)
-----   Message from Briant Cedar, PA-C sent at 10/26/2022  3:28 PM EDT ----- Please notify patient that iron levels are normal. No need for IV iron.

## 2022-10-26 NOTE — Telephone Encounter (Signed)
Pt advised and voiced understanding.   

## 2022-10-26 NOTE — Progress Notes (Signed)
[facility] [phone]   Fax:([phone]  PROGRESS NOTE  Patient Care Team: Rodrigo Ran, MD as PCP - General (Internal Medicine) Mealor, Roberts Gaudy, MD as PCP - Electrophysiology (Cardiology)  Hematological/Oncological History  # Iron Deficiency Anemia 2/2 Unclear Etiology  # Anemia of Chronic Disease/Inflammation  08/04/2021: WBC 7.05, Hgb 8.7, MCV 82.7, Plt 407. Ferritin 149, Iron sat 9%, TIBC 203, Iron 19.  Cr 0.7 08/11/2021: establish care with Dr. Leonides Schanz 08/18/2021-08/25/2021: Patient received IV Feraheme 510 mg x 2 doses 01/22/2022: White blood cell count 4.9, hemoglobin 9.5, MCV 100.7, platelets of 284. Iron sat of 14% with ferritin of 199 04/26/2022: WBC 4.3, Hgb 10.3, MCV 98.2, Plt 243, Iron 49, saturation 21% Interval History:  Jeanette KETTLEWELL 88 y.o. female with medical history significant for iron deficiency anemia of unclear etiology who presents for a follow up visit. The patient's last visit was on 10/192023. In the interim since the last visit she was hospitalized from 08/16/2022-08/19/2022 due to acute metabolic encephalopathy secondary to polypharmacy. She was found to have increased her home medications and was taking medications that were not prescribed to her as well as taking medications that have been discontinued.   Ms. Sutphin reports that she has regained most of her mental clarity as hospital discharge. She is struggling with recurrent UTI and recently completed a round of antibiotics. She does have urinary incontinence and wears a a diaper throughout the day. She denies hematuria or dysuria. She adds that she has persistent bilateral flank pain and is follow up with her urologist on Monday.   She reports stable fatigue and requires some assistance to complete her ADLs. She denies nausea, vomiting or abdominal pain. Her bowel habits are unchanged. She denies fevers, chills, sweats, shortness of breath, chest pain or cough. She has no other  complaints.  Full 10 point ROS is listed below.  MEDICAL HISTORY:  Past Medical History:  Diagnosis Date   A-fib (HCC) 03/15/2018   Anal cancer (HCC)    Anxiety    Arthritis    Breast cancer (HCC)    Breast cancer (HCC)    Cancer of breast (HCC)    Depression    Dysrhythmia 2010   a-fib   Hyperlipidemia    Hypertension    PVD (peripheral vascular disease) (HCC)    RAS (renal artery stenosis) (HCC)    Stroke (HCC) 2019   no residual, x 2    SURGICAL HISTORY: Past Surgical History:  Procedure Laterality Date   BREAST BIOPSY Left 01/14/2003   BREAST LUMPECTOMY Right 1989   CARDIOVASCULAR STRESS TEST  07/27/2003   No evidence if LV myocardial ischemia or scar. LV EF 74%.   GYNECOLOGIC CRYOSURGERY     IR CT HEAD LTD  12/04/2019   IR PERCUTANEOUS ART THROMBECTOMY/INFUSION INTRACRANIAL INC DIAG ANGIO  12/04/2019   IR US GUIDE VASC ACCESS LEFT  12/04/2019   OPEN REDUCTION INTERNAL FIXATION (ORIF) DISTAL RADIAL FRACTURE Left 02/19/2017   Procedure: OPEN REDUCTION INTERNAL FIXATION (ORIF) LEFT DISTAL RADIAL FRACTURE;  Surgeon: Betha Loa, MD;  Location: [facility];  Service: Orthopedics;  Laterality: Left;   OVARIAN CYST REMOVAL     RADIOLOGY WITH ANESTHESIA N/A 12/04/2019   Procedure: IR WITH ANESTHESIA;  Surgeon: Julieanne Cotton, MD;  Location: [facility] OR;  Service: Radiology;  Laterality: N/A;   RENAL DOPPLER  09/09/2012   Right proximal renal artery: 60-99% diameter reduction. Normal patency of the left main renal artery.  TRANSTHORACIC ECHOCARDIOGRAM  03/22/2006   EF 55-60%, mild mitral valvular regurg, pulmonary artery systolic pressure was moderately increased.   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   Tobacco Use   Smoking status: Never   Smokeless tobacco: Never  Vaping Use   Vaping Use: Never used  Substance and Sexual Activity   Alcohol use: Yes    Alcohol/week: 2.0 standard drinks of alcohol    Types: 2 Glasses of wine per week    Comment: rare   Drug use: No   Sexual activity: Yes    Partners: Male    Birth control/protection: Post-menopausal  Other Topics Concern   Not on file  Social History Narrative   Lives with spouse   Social Determinants of Health   Financial Resource Strain: Not on file  Food Insecurity: Not on file  Transportation Needs: Not on file  Physical Activity: Not on file  Stress: Not on file  Social Connections: Not on file  Intimate Partner Violence: Not on file    FAMILY HISTORY: Family History  Problem Relation Age of Onset   Alzheimer's disease Mother    Pneumonia Mother    Heart failure Father    Heart disease Sister    Heart disease Brother    Heart disease Brother    Heart disease Sister    Pneumonia Sister     ALLERGIES:  is allergic to epinephrine, cozaar [losartan], crestor [rosuvastatin], ezetimibe, galantamine hydrobromide, iron, pregabalin, septra [sulfamethoxazole-trimethoprim], vytorin [ezetimibe-simvastatin], benazepril hcl, crestor [rosuvastatin calcium], and lisinopril.  MEDICATIONS:  Current Outpatient Medications  Medication Sig Dispense Refill   acetaminophen (TYLENOL) 650 MG CR tablet Take 650 mg by mouth 2 (two) times daily.     apixaban (ELIQUIS) 5 MG TABS tablet Take 1 tablet (5 mg total) by mouth 2 (two) times daily. 60 tablet 1   escitalopram (LEXAPRO) 10 MG tablet Take 10 mg by mouth daily.     metoprolol (TOPROL-XL) 200 MG 24 hr tablet Take 100 mg by mouth 2 (two) times daily.     olmesartan (BENICAR) 20 MG tablet Take 10 mg by mouth daily.     REPATHA SURECLICK 140 MG/ML SOAJ Inject 140 mg into the skin See admin instructions. Take 140mg  SQ every two weeks per patient     nitroGLYCERIN (NITROSTAT) 0.4 MG  SL tablet PLACE 1 TABLET (0.4 MG TOTAL) UNDER THE TONGUE EVERY 5 (FIVE) MINUTES AS NEEDED FOR CHEST PAIN. 75 tablet 1   No current facility-administered medications for this visit.    REVIEW OF SYSTEMS:   Constitutional: ( - ) fevers, ( - )  chills , ( - ) night sweats Eyes: ( - ) blurriness of vision, ( - ) double vision, ( - ) watery eyes Ears, nose, mouth, throat, and face: ( - ) mucositis, ( - ) sore throat Respiratory: ( - ) cough, ( - ) dyspnea, ( - ) wheezes Cardiovascular: ( - ) palpitation, ( - ) chest discomfort, ( - ) lower extremity swelling Gastrointestinal:  ( - ) nausea, ( - ) heartburn, ( - ) change in bowel habits Skin: ( - ) abnormal skin rashes Lymphatics: ( - ) new  lymphadenopathy, ( - ) easy bruising Neurological: ( - ) numbness, ( - ) tingling, ( - ) new weaknesses Behavioral/Psych: ( - ) mood change, ( - ) new changes  All other systems were reviewed with the patient and are negative.  PHYSICAL EXAMINATION:  Vitals:   10/26/22 1034  BP: (!) 130/57  Pulse: 64  Resp: 14  Temp: 97.7 F (36.5 C)  SpO2: 100%    Filed Weights   10/26/22 1034  Weight: 126 lb (57.2 kg)     GENERAL: Well-appearing elderly Caucasian female, alert, no distress and comfortable SKIN: skin color, texture, turgor are normal, no rashes or significant lesions EYES: conjunctiva are pink and non-injected, sclera clear LUNGS: clear to auscultation and percussion with normal breathing effort HEART: regular rate & rhythm and no murmurs and no lower extremity edema Musculoskeletal: no cyanosis of digits and no clubbing  PSYCH: alert & oriented x 3, fluent speech NEURO: no focal motor/sensory deficits  LABORATORY DATA:  I have reviewed the data as listed    Latest Ref Rng & Units 10/26/2022   10:14 AM 08/17/2022    4:14 AM 08/16/2022    3:38 PM  CBC  WBC 4.0 - 10.5 K/uL 4.8  5.7  7.1   Hemoglobin 12.0 - 15.0 g/dL 11.9  14.7  82.9   Hematocrit 36.0 - 46.0 % 36.9  32.7  36.4    Platelets 150 - 400 K/uL 206  199  230        Latest Ref Rng & Units 10/26/2022   10:14 AM 08/17/2022    4:14 AM 08/16/2022    3:38 PM  CMP  Glucose 70 - 99 mg/dL 83  562  130   BUN 8 - 23 mg/dL 11  8  12    Creatinine 0.44 - 1.00 mg/dL 8.65  7.84  6.96   Sodium 135 - 145 mmol/L 140  134  138   Potassium 3.5 - 5.1 mmol/L 3.9  3.1  3.7   Chloride 98 - 111 mmol/L 106  96  100   CO2 22 - 32 mmol/L 32  30  30   Calcium 8.9 - 10.3 mg/dL 29.5  28.4  13.2   Total Protein 6.5 - 8.1 g/dL 6.8  6.5  7.0   Total Bilirubin 0.3 - 1.2 mg/dL 0.4  0.7  1.0   Alkaline Phos 38 - 126 U/L 68  46  46   AST 15 - 41 U/L 11  16  19    ALT 0 - 44 U/L 6  7  6      RADIOGRAPHIC STUDIES: No results found.  ASSESSMENT & PLAN KORBYN VANES 88 y.o. female with medical history significant for iron deficiency anemia of unclear etiology who presents for a follow up visit.   # Iron Deficiency Anemia 2/2 Unclear Etiology  # Anemia of Chronic Disease/Inflammation  -- Findings are consistent with anemia of chronic disease with a likely component of iron deficiency anemia secondary to an unclear etiology --Patient had markedly elevated inflammatory markers previously and has known history of rheumatoid arthritis. -- Patient unable to tolerate p.o. iron therapy due to diarrhea. -- Patient is status post 2 doses of IV Feraheme in February 2023 --Labs today show white blood cell count 4.8, hemoglobin 11.7, MCV 100.3, and platelets of 206. Ion panel shows serum iron  83, iron sat 31%, TIBC 269, ferritin levels pending.   --Monitor for now and no indication for IV iron at this time.  --RTC  in 6 months time to reassesses.   #Recurrent UTI #Urinary incontinence #Flank pain: --Patient is under the care of [facility] Urology team --Scheduled for a follow up this upcoming Monday  No orders of the defined types were placed in this encounter.   All questions were answered. The patient knows to call  the clinic with any problems, questions or concerns.  I have spent a total of 30 minutes minutes of face-to-face and non-face-to-face time, preparing to see the patient, performing a medically appropriate examination, counseling and educating the patient, documenting clinical information in the electronic health record, and care coordination.    Georga Kaufmann PA-C Dept of Hematology and Oncology [facility] Phone: [phone]   10/26/2022 12:29 PM

## 2022-11-02 ENCOUNTER — Other Ambulatory Visit: Payer: Self-pay | Admitting: Adult Health

## 2022-11-02 DIAGNOSIS — N644 Mastodynia: Secondary | ICD-10-CM

## 2022-11-02 DIAGNOSIS — Z853 Personal history of malignant neoplasm of breast: Secondary | ICD-10-CM

## 2022-11-04 ENCOUNTER — Other Ambulatory Visit: Payer: Self-pay | Admitting: Cardiology

## 2022-11-04 DIAGNOSIS — I480 Paroxysmal atrial fibrillation: Secondary | ICD-10-CM

## 2022-11-04 DIAGNOSIS — I484 Atypical atrial flutter: Secondary | ICD-10-CM

## 2022-11-19 ENCOUNTER — Observation Stay (HOSPITAL_COMMUNITY): Payer: Medicare Other

## 2022-11-19 ENCOUNTER — Inpatient Hospital Stay (HOSPITAL_COMMUNITY)
Admission: EM | Admit: 2022-11-19 | Discharge: 2022-11-22 | DRG: 689 | Disposition: A | Discharge status: Home Health | Payer: Medicare Other | Attending: Internal Medicine | Admitting: Internal Medicine

## 2022-11-19 ENCOUNTER — Emergency Department (HOSPITAL_COMMUNITY): Payer: Medicare Other

## 2022-11-19 ENCOUNTER — Other Ambulatory Visit: Payer: Self-pay

## 2022-11-19 ENCOUNTER — Encounter (HOSPITAL_COMMUNITY): Payer: Self-pay | Admitting: Emergency Medicine

## 2022-11-19 DIAGNOSIS — F0390 Unspecified dementia without behavioral disturbance: Secondary | ICD-10-CM | POA: Diagnosis present

## 2022-11-19 DIAGNOSIS — W19XXXA Unspecified fall, initial encounter: Secondary | ICD-10-CM

## 2022-11-19 DIAGNOSIS — R4182 Altered mental status, unspecified: Secondary | ICD-10-CM | POA: Diagnosis not present

## 2022-11-19 DIAGNOSIS — G9340 Encephalopathy, unspecified: Secondary | ICD-10-CM | POA: Diagnosis not present

## 2022-11-19 DIAGNOSIS — N390 Urinary tract infection, site not specified: Principal | ICD-10-CM | POA: Insufficient documentation

## 2022-11-19 DIAGNOSIS — R402422 Glasgow coma scale score 9-12, at arrival to emergency department: Secondary | ICD-10-CM

## 2022-11-19 DIAGNOSIS — Z8249 Family history of ischemic heart disease and other diseases of the circulatory system: Secondary | ICD-10-CM

## 2022-11-19 DIAGNOSIS — Z853 Personal history of malignant neoplasm of breast: Secondary | ICD-10-CM

## 2022-11-19 DIAGNOSIS — D649 Anemia, unspecified: Secondary | ICD-10-CM | POA: Diagnosis present

## 2022-11-19 DIAGNOSIS — I480 Paroxysmal atrial fibrillation: Secondary | ICD-10-CM | POA: Diagnosis present

## 2022-11-19 DIAGNOSIS — N3000 Acute cystitis without hematuria: Secondary | ICD-10-CM

## 2022-11-19 DIAGNOSIS — Z85048 Personal history of other malignant neoplasm of rectum, rectosigmoid junction, and anus: Secondary | ICD-10-CM

## 2022-11-19 DIAGNOSIS — Z7901 Long term (current) use of anticoagulants: Secondary | ICD-10-CM

## 2022-11-19 DIAGNOSIS — I110 Hypertensive heart disease with heart failure: Secondary | ICD-10-CM | POA: Diagnosis present

## 2022-11-19 DIAGNOSIS — N3001 Acute cystitis with hematuria: Secondary | ICD-10-CM

## 2022-11-19 DIAGNOSIS — Z8673 Personal history of transient ischemic attack (TIA), and cerebral infarction without residual deficits: Secondary | ICD-10-CM

## 2022-11-19 DIAGNOSIS — Z820 Family history of epilepsy and other diseases of the nervous system: Secondary | ICD-10-CM

## 2022-11-19 DIAGNOSIS — E782 Mixed hyperlipidemia: Secondary | ICD-10-CM | POA: Diagnosis present

## 2022-11-19 DIAGNOSIS — I10 Essential (primary) hypertension: Secondary | ICD-10-CM | POA: Diagnosis not present

## 2022-11-19 DIAGNOSIS — E860 Dehydration: Secondary | ICD-10-CM | POA: Diagnosis present

## 2022-11-19 DIAGNOSIS — Y92009 Unspecified place in unspecified non-institutional (private) residence as the place of occurrence of the external cause: Secondary | ICD-10-CM

## 2022-11-19 DIAGNOSIS — I5032 Chronic diastolic (congestive) heart failure: Secondary | ICD-10-CM | POA: Diagnosis present

## 2022-11-19 DIAGNOSIS — I739 Peripheral vascular disease, unspecified: Secondary | ICD-10-CM | POA: Diagnosis present

## 2022-11-19 DIAGNOSIS — G9341 Metabolic encephalopathy: Secondary | ICD-10-CM | POA: Diagnosis present

## 2022-11-19 DIAGNOSIS — E039 Hypothyroidism, unspecified: Secondary | ICD-10-CM | POA: Diagnosis present

## 2022-11-19 DIAGNOSIS — Z888 Allergy status to other drugs, medicaments and biological substances status: Secondary | ICD-10-CM

## 2022-11-19 DIAGNOSIS — Z79899 Other long term (current) drug therapy: Secondary | ICD-10-CM

## 2022-11-19 DIAGNOSIS — I701 Atherosclerosis of renal artery: Secondary | ICD-10-CM | POA: Diagnosis present

## 2022-11-19 LAB — RAPID URINE DRUG SCREEN, HOSP PERFORMED
Amphetamines: NOT DETECTED
Barbiturates: NOT DETECTED
Benzodiazepines: NOT DETECTED
Cocaine: NOT DETECTED
Opiates: NOT DETECTED
Tetrahydrocannabinol: NOT DETECTED

## 2022-11-19 LAB — COMPREHENSIVE METABOLIC PANEL
ALT: 11 U/L (ref 0–44)
AST: 20 U/L (ref 15–41)
Albumin: 3.7 g/dL (ref 3.5–5.0)
Alkaline Phosphatase: 58 U/L (ref 38–126)
Anion gap: 10 (ref 5–15)
BUN: 8 mg/dL (ref 8–23)
CO2: 27 mmol/L (ref 22–32)
Calcium: 10.5 mg/dL — ABNORMAL HIGH (ref 8.9–10.3)
Chloride: 99 mmol/L (ref 98–111)
Creatinine, Ser: 0.7 mg/dL (ref 0.44–1.00)
GFR, Estimated: >60 mL/min (ref >60)
Glucose, Bld: 143 mg/dL — ABNORMAL HIGH (ref 70–99)
Potassium: 3.1 mmol/L — ABNORMAL LOW (ref 3.5–5.1)
Sodium: 136 mmol/L (ref 135–145)
Total Bilirubin: 0.9 mg/dL (ref 0.3–1.2)
Total Protein: 7 g/dL (ref 6.5–8.1)

## 2022-11-19 LAB — I-STAT CHEM 8, ED
BUN: 9 mg/dL (ref 8–23)
Calcium, Ion: 1.32 mmol/L (ref 1.15–1.40)
Chloride: 100 mmol/L (ref 98–111)
Creatinine, Ser: 0.5 mg/dL (ref 0.44–1.00)
Glucose, Bld: 142 mg/dL — ABNORMAL HIGH (ref 70–99)
HCT: 36 % (ref 36.0–46.0)
Hemoglobin: 12.2 g/dL (ref 12.0–15.0)
Potassium: 3.2 mmol/L — ABNORMAL LOW (ref 3.5–5.1)
Sodium: 138 mmol/L (ref 135–145)
TCO2: 28 mmol/L (ref 22–32)

## 2022-11-19 LAB — CBC
HCT: 36.1 % (ref 36.0–46.0)
Hemoglobin: 11.2 g/dL — ABNORMAL LOW (ref 12.0–15.0)
MCH: 31 pg (ref 26.0–34.0)
MCHC: 31 g/dL (ref 30.0–36.0)
MCV: 100 fL (ref 80.0–100.0)
Platelets: 226 10*3/uL (ref 150–400)
RBC: 3.61 MIL/uL — ABNORMAL LOW (ref 3.87–5.11)
RDW: 13.2 % (ref 11.5–15.5)
WBC: 8.1 10*3/uL (ref 4.0–10.5)
nRBC: 0 % (ref 0.0–0.2)

## 2022-11-19 LAB — URINALYSIS, W/ REFLEX TO CULTURE (INFECTION SUSPECTED)
Bilirubin Urine: NEGATIVE
Glucose, UA: NEGATIVE mg/dL
Ketones, ur: NEGATIVE mg/dL
Nitrite: POSITIVE — AB
Protein, ur: NEGATIVE mg/dL
Specific Gravity, Urine: 1.011 (ref 1.005–1.030)
WBC, UA: >50 WBC/hpf (ref 0–5)
pH: 8 (ref 5.0–8.0)

## 2022-11-19 LAB — TROPONIN I (HIGH SENSITIVITY)
Troponin I (High Sensitivity): 18 ng/L — ABNORMAL HIGH (ref <18)
Troponin I (High Sensitivity): 21 ng/L — ABNORMAL HIGH (ref <18)

## 2022-11-19 LAB — MAGNESIUM: Magnesium: 1.7 mg/dL (ref 1.7–2.4)

## 2022-11-19 LAB — LACTIC ACID, PLASMA
Lactic Acid, Venous: 2.6 mmol/L (ref 0.5–1.9)
Lactic Acid, Venous: 1.7 mmol/L (ref 0.5–1.9)

## 2022-11-19 LAB — CK: Total CK: 60 U/L (ref 38–234)

## 2022-11-19 LAB — CBG MONITORING, ED: Glucose-Capillary: 132 mg/dL — ABNORMAL HIGH (ref 70–99)

## 2022-11-19 MED ORDER — LACTATED RINGERS IV BOLUS: 1000.0000 mL | Freq: Once | INTRAVENOUS | Status: DC

## 2022-11-19 MED ORDER — POTASSIUM CHLORIDE 10 MEQ/100ML IV SOLN
10.0000 meq | INTRAVENOUS | Status: AC
  Administered 2022-11-19 (×4): 10 meq via INTRAVENOUS
  Filled 2022-11-19 (×4): qty 100

## 2022-11-19 MED ORDER — METOPROLOL SUCCINATE ER 100 MG PO TB24
100.0000 mg | ORAL_TABLET | Freq: Two times a day (BID) | ORAL | Status: DC
  Administered 2022-11-19 – 2022-11-22 (×6): 100 mg via ORAL
  Filled 2022-11-19: qty 4
  Filled 2022-11-19 (×5): qty 1

## 2022-11-19 MED ORDER — SODIUM CHLORIDE 0.9% FLUSH
3.0000 mL | Freq: Two times a day (BID) | INTRAVENOUS | Status: DC
  Administered 2022-11-21 – 2022-11-22 (×2): 3 mL via INTRAVENOUS

## 2022-11-19 MED ORDER — ACETAMINOPHEN 325 MG PO TABS
650.0000 mg | ORAL_TABLET | Freq: Four times a day (QID) | ORAL | Status: DC | PRN
  Administered 2022-11-21 – 2022-11-22 (×2): 650 mg via ORAL
  Filled 2022-11-19 (×2): qty 2

## 2022-11-19 MED ORDER — IRBESARTAN 150 MG PO TABS
150.0000 mg | ORAL_TABLET | Freq: Every day | ORAL | Status: DC
  Administered 2022-11-20 – 2022-11-22 (×3): 150 mg via ORAL
  Filled 2022-11-19 (×3): qty 1

## 2022-11-19 MED ORDER — SODIUM CHLORIDE 0.9 % IV SOLN
1.0000 g | INTRAVENOUS | Status: DC
  Administered 2022-11-20 – 2022-11-21 (×2): 1 g via INTRAVENOUS
  Filled 2022-11-19 (×2): qty 10

## 2022-11-19 MED ORDER — POLYETHYLENE GLYCOL 3350 17 G PO PACK: 17.0000 g | PACK | Freq: Every day | ORAL | Status: DC | PRN

## 2022-11-19 MED ORDER — SODIUM CHLORIDE 0.9 % IV SOLN: 1.0000 g | INTRAVENOUS | Status: DC

## 2022-11-19 MED ORDER — SODIUM CHLORIDE 0.9 % IV SOLN: INTRAVENOUS | Status: AC

## 2022-11-19 MED ORDER — APIXABAN 5 MG PO TABS
5.0000 mg | ORAL_TABLET | Freq: Two times a day (BID) | ORAL | Status: DC
  Administered 2022-11-19 – 2022-11-22 (×6): 5 mg via ORAL
  Filled 2022-11-19 (×6): qty 1

## 2022-11-19 MED ORDER — SODIUM CHLORIDE 0.9 % IV SOLN
1.0000 g | Freq: Once | INTRAVENOUS | Status: AC
  Administered 2022-11-19: 1 g via INTRAVENOUS
  Filled 2022-11-19: qty 10

## 2022-11-19 MED ORDER — ACETAMINOPHEN 650 MG RE SUPP: 650.0000 mg | Freq: Four times a day (QID) | RECTAL | Status: DC | PRN

## 2022-11-19 MED ORDER — SODIUM CHLORIDE 0.9 % IV BOLUS
1000.0000 mL | Freq: Once | INTRAVENOUS | Status: AC
  Administered 2022-11-19: 1000 mL via INTRAVENOUS

## 2022-11-19 MED ORDER — IOHEXOL 350 MG/ML SOLN
75.0000 mL | Freq: Once | INTRAVENOUS | Status: AC | PRN
  Administered 2022-11-19: 75 mL via INTRAVENOUS

## 2022-11-19 NOTE — ED Notes (Signed)
Patient returned from MRI, back on the monitor and potassium resumed.

## 2022-11-19 NOTE — ED Provider Notes (Signed)
[place] [facility]GENC[facility] DEPARTMENT AT [facility] Provider Note   CSN: [id number] Arrival date & time: 11/19/22  1114     History  Chief Complaint  Patient presents with   Marletta Lor    Jeanette [name] is a 88 y.o. female with HTN, history of CVA, paroxysmal A-fib on eliquis, chronic diastolic CHF, primary hypothyroidism, history of bowel and bladder incontinence who presents from home with a fall at home this morning. Pt found on the floor by family, unknown downtime. Per EMS, patient's family stated that she has been disoriented for the last few days. Noted to smell like urine. Has frequent UTIs. Pt with ccollar in place, unable to answer any questions or state name at present, which family on scene stated is very abnormal for her.  Pt sitting alert in stretcher no distress noted. C-collar placed by EMS. Vitals HR 62, bp 162/70, cbg 160, 98% on 2L, RR 18. 18g LAC.   On my evaluation, patient cannot provide any history, she is A&Ox0, does not answer questions. She does intermittently follow commands and is moving all extremities. HDS albeit hypertensive.   Fall       Home Medications Prior to Admission medications   Medication Sig Start Date End Date Taking? Authorizing Provider  apixaban (ELIQUIS) 5 MG TABS tablet Take 1 tablet (5 mg total) by mouth 2 (two) times daily. 12/06/19  Yes Rinehuls, Kinnie Scales, PA-C  acetaminophen (TYLENOL) 650 MG CR tablet Take 650 mg by mouth 2 (two) times daily.    [provider]  escitalopram (LEXAPRO) 10 MG tablet Take 10 mg by mouth daily.    [provider]  metoprolol (TOPROL-XL) 200 MG 24 hr tablet Take 100 mg by mouth 2 (two) times daily.    [provider]  nitroGLYCERIN (NITROSTAT) 0.4 MG SL tablet PLACE 1 TABLET (0.4 MG TOTAL) UNDER THE TONGUE EVERY 5 (FIVE) MINUTES AS NEEDED FOR CHEST PAIN. 07/13/19 08/23/22  Patwardhan, Anabel Bene, MD  olmesartan (BENICAR) 20 MG tablet Take 1 tablet (20 mg total) by mouth daily.  11/22/22   Kathlen Mody, MD  polyethylene glycol (MIRALAX / GLYCOLAX) 17 g packet Take 17 g by mouth daily as needed for mild constipation. 11/22/22   Kathlen Mody, MD  REPATHA SURECLICK 140 MG/ML SOAJ Inject 140 mg into the skin See admin instructions. Take 140mg  SQ every two weeks per patient 10/12/19   [provider]      Allergies    Epinephrine, Cozaar [losartan], Crestor [rosuvastatin], Ezetimibe, Galantamine hydrobromide, Iron, Pregabalin, Septra [sulfamethoxazole-trimethoprim], Vytorin [ezetimibe-simvastatin], Benazepril hcl, Crestor [rosuvastatin calcium], and Lisinopril    Review of Systems   Review of Systems  Unable to perform ROS: Mental status change   Physical Exam Updated Vital Signs BP (!) 173/60 (BP Location: Right Arm)   Pulse 75   Temp 98.4 F (36.9 C) (Axillary)   Resp 18   Ht 5\' 3"  (1.6 m)   Wt 53 kg   SpO2 100%   BMI 20.70 kg/m  Physical Exam  PRIMARY SURVEY  Airway Airway intact  Breathing Bilateral breath sounds  Circulation Carotid/femoral pulses 2+ intact bilaterally  GCS E =  4 V =  2 M =  4-5 Total = 10-11  Environment All clothes removed      SECONDARY SURVEY  Gen: -NAD  HEENT: -Head: NCAT. Scalp is clear of lacerations or wounds. Skull is clear of deformities or depressions -Forehead: Normal -Midface: Stable -Eyes: No visible injury to eyelids or eye,  PERRL, EOMI -Nose: No gross deformities, no septal hematoma -Mouth: No injuries to lips, tongue or teeth. No trismus or malposition -Ears: No auricular hematoma -Neck: Trachea is midline, no distended neck veins  Chest: -No tenderness, deformities, bruising or crepitus to clavicles or chest -Normal chest expansion -Normal heart sounds, S1/S2 normal, no m/r/g -No wheezes, rales, rhonchi  Abdomen: -+lower abdominal tenderness -No bruising or penetrating injury  Pelvis: -Pelvis is stable and non-tender  Genital:  -No gross deformity or visible injury to perineum or external  genitalia -No blood at urethral meatus -No incontinence  Extremities: Right Upper Extremity: -No point tenderness, deformity or other signs of injury -Radial pulse intact RUE, cap refill good -Normal sensation -Normal ROM, good strength Left Upper Extremity: -No point tenderness, deformity or other signs of injury -Radial pulse intact LUE, cap refill good -Normal sensation -Normal ROM, good strength Right Lower Extremity: -No point tenderness, deformity or other signs of injury -DP intact RLE -Normal sensation -Normal ROM, good strength Left Lower Extremity: -No point tenderness, deformity or other signs of injury -DP intact LLE -Normal sensation -Normal ROM, good strength  Back/Spine: -+C spine midline tenderness without any step-offs -Rectal: good tone, no gross blood -Collar: EMS collar in place   Other: N/A     ED Results / Procedures / Treatments   Labs (all labs ordered are listed, but only abnormal results are displayed) Labs Reviewed  URINE CULTURE - Abnormal; Notable for the following components:      Result Value   Culture >=100,000 COLONIES/mL KLEBSIELLA PNEUMONIAE (*)    Organism ID, Bacteria KLEBSIELLA PNEUMONIAE (*)    All other components within normal limits  COMPREHENSIVE METABOLIC PANEL - Abnormal; Notable for the following components:   Potassium 3.1 (*)    Glucose, Bld 143 (*)    Calcium 10.5 (*)    All other components within normal limits  CBC - Abnormal; Notable for the following components:   RBC 3.61 (*)    Hemoglobin 11.2 (*)    All other components within normal limits  URINALYSIS, W/ REFLEX TO CULTURE (INFECTION SUSPECTED) - Abnormal; Notable for the following components:   APPearance HAZY (*)    Hgb urine dipstick SMALL (*)    Nitrite POSITIVE (*)    Leukocytes,Ua MODERATE (*)    Bacteria, UA MANY (*)    All other components within normal limits  LACTIC ACID, PLASMA - Abnormal; Notable for the following components:   Lactic Acid,  Venous 2.6 (*)    All other components within normal limits  COMPREHENSIVE METABOLIC PANEL - Abnormal; Notable for the following components:   BUN 6 (*)    Total Protein 6.1 (*)    Albumin 3.1 (*)    All other components within normal limits  CBC - Abnormal; Notable for the following components:   RBC 3.29 (*)    Hemoglobin 10.4 (*)    HCT 32.1 (*)    All other components within normal limits  RENAL FUNCTION PANEL - Abnormal; Notable for the following components:   Potassium 3.1 (*)    BUN 6 (*)    Calcium 10.4 (*)    Phosphorus 2.3 (*)    Albumin 3.0 (*)    All other components within normal limits  MAGNESIUM - Abnormal; Notable for the following components:   Magnesium 1.6 (*)    All other components within normal limits  CBC - Abnormal; Notable for the following components:   RBC 3.61 (*)    Hemoglobin 10.9 (*)  HCT 34.1 (*)    All other components within normal limits  BASIC METABOLIC PANEL - Abnormal; Notable for the following components:   BUN 6 (*)    Calcium 10.5 (*)    All other components within normal limits  CBG MONITORING, ED - Abnormal; Notable for the following components:   Glucose-Capillary 132 (*)    All other components within normal limits  I-STAT CHEM 8, ED - Abnormal; Notable for the following components:   Potassium 3.2 (*)    Glucose, Bld 142 (*)    All other components within normal limits  TROPONIN I (HIGH SENSITIVITY) - Abnormal; Notable for the following components:   Troponin I (High Sensitivity) 18 (*)    All other components within normal limits  TROPONIN I (HIGH SENSITIVITY) - Abnormal; Notable for the following components:   Troponin I (High Sensitivity) 21 (*)    All other components within normal limits  RAPID URINE DRUG SCREEN, HOSP PERFORMED  ETHANOL  CK  LACTIC ACID, PLASMA  MAGNESIUM  AMMONIA  MAGNESIUM    EKG EKG Interpretation  Date/Time:  Monday Nov 19 2022 11:32:33 EDT Ventricular Rate:  87 PR Interval:  163 QRS  Duration: 75 QT Interval:  446 QTC Calculation: 537 R Axis:   73 Text Interpretation: Atrial fibrillation Nonspecific T abnormalities, anterior leads Prolonged QT interval Confirmed by Vivi Barrack [phone]) on 11/19/2022 12:15:45 PM  Radiology No results found.  Procedures .Critical Care  Performed by: Loetta Rough, MD Authorized by: Loetta Rough, MD   Critical care provider statement:    Critical care time (minutes):  35   Critical care was necessary to treat or prevent imminent or life-threatening deterioration of the following conditions:  CNS failure or compromise and sepsis   Critical care was time spent personally by me on the following activities:  Development of treatment plan with patient or surrogate, discussions with consultants, evaluation of patient's response to treatment, examination of patient, ordering and review of laboratory studies, ordering and review of radiographic studies, ordering and performing treatments and interventions, pulse oximetry, re-evaluation of patient's condition, review of old charts and obtaining history from patient or surrogate   Care discussed with: admitting provider       Medications Ordered in ED Medications  0.9 %  sodium chloride infusion (0 mLs Intravenous Stopped 11/20/22 0959)  iohexol (OMNIPAQUE) 350 MG/ML injection 75 mL (75 mLs Intravenous Contrast Given 11/19/22 1355)  cefTRIAXone (ROCEPHIN) 1 g in sodium chloride 0.9 % 100 mL IVPB (0 g Intravenous Stopped 11/19/22 1542)  sodium chloride 0.9 % bolus 1,000 mL (0 mLs Intravenous Stopped 11/19/22 1529)  potassium chloride 10 mEq in 100 mL IVPB (0 mEq Intravenous Stopped 11/19/22 2152)  magnesium sulfate IVPB 2 g 50 mL (0 g Intravenous Stopped 11/22/22 0722)  cefTRIAXone (ROCEPHIN) 1 g in sodium chloride 0.9 % 100 mL IVPB (0 g Intravenous Stopped 11/22/22 1147)    ED Course/ Medical Decision Making/ A&P                          Medical Decision Making Amount and/or Complexity of  Data Reviewed Labs: ordered. Decision-making details documented in ED Course. Radiology: ordered. Decision-making details documented in ED Course.  Risk Prescription drug management. Decision regarding hospitalization.    This patient presents to the ED for concern of AMS, found down on floor, this involves an extensive number of treatment options, and is a complaint that carries with it  a high risk of complications and morbidity.  I considered the following differential and admission for this acute, potentially life threatening condition.   MDM:    Ddx of acute altered mental status or encephalopathy considered but not limited to: -Intracranial abnormalities such as ICH, hydrocephalus, head trauma -patient is NCAT on exam however she is altered newly in the setting of being found down on the floor, great concern for intracranial hemorrhage and will emergently obtain CT head -Patient with C-spine tenderness palpation apparent on exam, consider vertebral fracture.  She withdraws to pain in all 4 extremities and is moving all 4 extremities symmetrically, low concern for spinal cord injury. -Infection such as UTI, given family's report of recurrent UTIs as well as consider intra-abdominal source given patient's lower intra-abdominal tenderness palpation, consider appendicitis, diverticulitis -Toxic ingestion such as EtOH, polypharmacy. No miosis to indicate opiate intoxication. -Electrolyte abnormalities or hyper/hypoglycemia -Hepatic encephalopathy or uremia -ACS or arrhythmia -Given the patient was found on the floor, consider rhabdomyolysis, unknown downtime   Clinical Course as of 12/01/22 1703  Mon Nov 19, 2022  1205 Glucose(!): 142 [HN]  1214 CT Head Wo Contrast 1. No acute intracranial abnormality. 2. Chronic small vessel ischemic disease and remote left parietal lobe infarct. 3. Stable appearance of calcified meningioma overlying the right frontal lobe. 4. No evidence for  cervical spine fracture or subluxation. 5. Cervical degenerative disc disease and facet arthropathy.   [HN]  1252 WBC: 8.1 No leukocytosis  [HN]  1252 Hemoglobin(!): 11.2 C/w priors [HN]  1252 DG Pelvis Portable No acute osseous abnormality of the pelvis. [HN]  1253 DG Chest Portable 1 View No acute cardiopulmonary disease. [HN]  1253 Tried calling patient's spouse Gerlene Burdock and it went straight to voicemail [HN]  1357 Urinalysis, w/ Reflex to Culture (Infection Suspected) -Urine, Clean Catch(!) +UTI, giving IV ceftriaxone [HN]  1358 Lactic Acid, Venous(!!): 2.6 Giving IVF [HN]  1358 CK Total: 60 [HN]  1358 Troponin I (High Sensitivity)(!): 18 [HN]  1434 Rapid urine drug screen (hospital performed) neg [HN]  1435 CT ABDOMEN PELVIS W CONTRAST 1. No acute findings in the abdomen/pelvis. 2. Diverticulosis of the sigmoid colon without active inflammation. 3. Two small liver hypodensities unchanged from 2014 and likely benign cysts. 4. 1.5 cm right renal cyst.  No follow-up imaging is recommended for 5. Aortic atherosclerosis. Atherosclerotic coronary artery disease. 6. Two small nodular opacities along the right minor fissure with the larger measuring 5 mm. No follow-up needed if patient is low-risk (and has no known or suspected primary neoplasm). Non-contrast chest CT can be considered in 12 months if patient is high-risk.   [HN]  1435 Consulted to hospitalist for admission [HN]    Clinical Course User Index [HN] Loetta Rough, MD    Labs: I Ordered, and personally interpreted labs.  The pertinent results include:  those listed above  Imaging Studies ordered: I ordered imaging studies including CT head, C-spine as well as I independently visualized and interpreted imaging. I agree with the radiologist interpretation  Additional history obtained from EMS, chart review.   Cardiac Monitoring: The patient was maintained on a cardiac monitor.  I personally viewed and  interpreted the cardiac monitored which showed an underlying rhythm of: Rate controlled atrial fibrillation  Reevaluation: After the interventions noted above, I reevaluated the patient and found that they have :improved  Social Determinants of Health: Patient lives independently   Disposition:  admit  Co morbidities that complicate the patient evaluation  Past Medical History:  Diagnosis Date   A-fib (HCC) 03/15/2018   Acute encephalopathy 08/16/2022   Anal cancer (HCC)    Anxiety    Arthritis    Breast cancer (HCC)    Breast cancer (HCC)    Cancer of breast (HCC)    CEREBRAL EMBOLISM WITH CEREBRAL INFARCTION 03/21/2009   Qualifier: Diagnosis of   By: Denyse Amass CMA, Carol       Depression    Dysrhythmia 2010   a-fib   Hyperlipidemia    Hypertension    Ischemic stroke (HCC) 12/04/2019   PVD (peripheral vascular disease) (HCC)    RAS (renal artery stenosis) (HCC)    Stroke (HCC) 2019   no residual, x 2     Medicines Meds ordered this encounter  Medications   iohexol (OMNIPAQUE) 350 MG/ML injection 75 mL   DISCONTD: lactated ringers bolus 1,000 mL   cefTRIAXone (ROCEPHIN) 1 g in sodium chloride 0.9 % 100 mL IVPB    Order Specific Question:   Antibiotic Indication:    Answer:   UTI   sodium chloride 0.9 % bolus 1,000 mL   DISCONTD: apixaban (ELIQUIS) tablet 5 mg   DISCONTD: metoprolol succinate (TOPROL-XL) 24 hr tablet 100 mg   DISCONTD: irbesartan (AVAPRO) tablet 150 mg   DISCONTD: sodium chloride flush (NS) 0.9 % injection 3 mL   potassium chloride 10 mEq in 100 mL IVPB   0.9 %  sodium chloride infusion   DISCONTD: acetaminophen (TYLENOL) tablet 650 mg   DISCONTD: acetaminophen (TYLENOL) suppository 650 mg   DISCONTD: polyethylene glycol (MIRALAX / GLYCOLAX) packet 17 g   DISCONTD: cefTRIAXone (ROCEPHIN) 1 g in sodium chloride 0.9 % 100 mL IVPB    Order Specific Question:   Antibiotic Indication:    Answer:   UTI   DISCONTD: cefTRIAXone (ROCEPHIN) 1 g in sodium  chloride 0.9 % 100 mL IVPB    Order Specific Question:   Antibiotic Indication:    Answer:   UTI   DISCONTD: hydrALAZINE (APRESOLINE) tablet 25 mg   DISCONTD: potassium chloride SA (KLOR-CON M) CR tablet 40 mEq   magnesium sulfate IVPB 2 g 50 mL   DISCONTD: potassium & sodium phosphates (PHOS-NAK) 280-160-250 MG packet 1 packet   DISCONTD: cephALEXin (KEFLEX) capsule 500 mg   cefTRIAXone (ROCEPHIN) 1 g in sodium chloride 0.9 % 100 mL IVPB    Order Specific Question:   Antibiotic Indication:    Answer:   UTI   polyethylene glycol (MIRALAX / GLYCOLAX) 17 g packet    Sig: Take 17 g by mouth daily as needed for mild constipation.    Dispense:  14 each    Refill:  0   cephALEXin (KEFLEX) 500 MG capsule    Sig: Take 1 capsule (500 mg total) by mouth 2 (two) times daily for 2 days.    Dispense:  4 capsule    Refill:  0   olmesartan (BENICAR) 20 MG tablet    Sig: Take 1 tablet (20 mg total) by mouth daily.    Dispense:  30 tablet    Refill:  1    I have reviewed the patients home medicines and have made adjustments as needed  Problem List / ED Course: Problem List Items Addressed This Visit       Genitourinary   * (Principal) UTI (urinary tract infection)     Other   History of CVA (cerebrovascular accident) - Primary   Relevant Orders   Ambulatory referral to Neurology   Other Visit Diagnoses  Glasgow coma scale total score 9-12, at arrival to emergency department                       This note was created using dictation software, which may contain spelling or grammatical errors.    Loetta Rough, MD 12/01/22 [phone]

## 2022-11-19 NOTE — ED Notes (Signed)
Patient changed and cleaned. Husband updated on status. Awaiting room upstairs.

## 2022-11-19 NOTE — H&P (Signed)
History and Physical   Jeanette [name] NWG:[id number] DOB: 1935-03-15 DOA: 11/19/2022  PCP: Rodrigo Ran, MD   Patient coming from: Home  Chief Complaint: Fall, found down  HPI: Jeanette [name] is a 88 y.o. female with medical history significant of renal artery stenosis, vertebral artery stenosis, stroke, hypertension, hyperlipidemia, breast cancer, atrial fibrillation, diastolic CHF, anemia presenting after fall at home.  History obtained with assistance of chart review due to patient's altered mental status.  Patient was reportedly found down on the floor by her family.  Unknown downtime.  EMS was called and EMS reported to the EDP that the family reported patient had been disoriented for several days.  Patient does have history of frequent UTIs and similar disorientation.  Patient reportedly was unable to say name and answer multiple questions, which family had reported to EMS is very unusual for her.  By the time my exam patient appeared to be more alert.  She was able respond to some questions but had some word finding difficulty.  Unclear if this is old or new will try to contact family.   Unable to complete full review of symptoms due to patient's altered mentation.  ED Course: Vital signs in the ED notable for blood pressure in the 130s to 180s systolic.  Lab workup included CMP with potassium 3.1, glucose 143, calcium 10.5.  CBC with hemoglobin stable 11.2.  Troponin flat at 18 and then 20 1 repeat.  CK normal.  Lactic acid 2.6 and then normal on repeat.  Ammonia level pending.  UDS normal.  Urinalysis positive for nitrites, leukocytes, bacteria.  Urine cultures pending.  Chest x-ray showed no acute normality, pelvis x-ray showed no acute abnormality.  CT head showed no acute abnormality.  CT C-spine showed no acute abnormality.  CT abdomen pelvis showed no acute abnormality.  Did note diverticulosis without diverticulitis, stable liver changes consistent with cyst, renal cyst, lung  nodules to consider follow-up.  Magnesium normal.  Patient received ceftriaxone and a liter of fluids in the ED.  Review of Systems: Unable to complete full review of symptoms due to patient's altered mentation.  Past Medical History:  Diagnosis Date   A-fib (HCC) 03/15/2018   Acute encephalopathy 08/16/2022   Anal cancer (HCC)    Anxiety    Arthritis    Breast cancer (HCC)    Breast cancer (HCC)    Cancer of breast (HCC)    CEREBRAL EMBOLISM WITH CEREBRAL INFARCTION 03/21/2009   Qualifier: Diagnosis of   By: Denyse Amass CMA, Carol       Depression    Dysrhythmia 2010   a-fib   Hyperlipidemia    Hypertension    Ischemic stroke (HCC) 12/04/2019   PVD (peripheral vascular disease) (HCC)    RAS (renal artery stenosis) (HCC)    Stroke (HCC) 2019   no residual, x 2    Past Surgical History:  Procedure Laterality Date   BREAST BIOPSY Left 01/14/2003   BREAST LUMPECTOMY Right 1989   CARDIOVASCULAR STRESS TEST  07/27/2003   No evidence if LV myocardial ischemia or scar. LV EF 74%.   GYNECOLOGIC CRYOSURGERY     IR CT HEAD LTD  12/04/2019   IR PERCUTANEOUS ART THROMBECTOMY/INFUSION INTRACRANIAL INC DIAG ANGIO  12/04/2019   IR US GUIDE VASC ACCESS LEFT  12/04/2019   OPEN REDUCTION INTERNAL FIXATION (ORIF) DISTAL RADIAL FRACTURE Left 02/19/2017   Procedure: OPEN REDUCTION INTERNAL FIXATION (ORIF) LEFT DISTAL RADIAL FRACTURE;  Surgeon: Betha Loa, MD;  Location: [facility];  Service: Orthopedics;  Laterality: Left;   OVARIAN CYST REMOVAL     RADIOLOGY WITH ANESTHESIA N/A 12/04/2019   Procedure: IR WITH ANESTHESIA;  Surgeon: Julieanne Cotton, MD;  Location: [facility];  Service: Radiology;  Laterality: N/A;   RENAL DOPPLER  09/09/2012   Right proximal renal artery: 60-99% diameter reduction. Normal patency of the left main renal artery.    TRANSTHORACIC ECHOCARDIOGRAM  03/22/2006   EF 55-60%, mild mitral valvular regurg, pulmonary artery systolic pressure was moderately increased.    TUBAL LIGATION      Social History  reports that she has never smoked. She has never used smokeless tobacco. She reports current alcohol use of about 2.0 standard drinks of alcohol per week. She reports that she does not use drugs.  Allergies  Allergen Reactions   Epinephrine Anaphylaxis    Shortness of breath and increased heart rate   Cozaar [Losartan] Other (See Comments)    increased air hunger and dizziness.   Crestor [Rosuvastatin] Other (See Comments)    memory problems.   Ezetimibe Other (See Comments)   Galantamine Hydrobromide Other (See Comments)    wt loss and dizziness   Iron Diarrhea   Pregabalin Other (See Comments)   Septra [Sulfamethoxazole-Trimethoprim] Other (See Comments)    2017 had a strange reaction she says.   Vytorin [Ezetimibe-Simvastatin] Other (See Comments)    unknown   Benazepril Hcl Cough   Crestor [Rosuvastatin Calcium] Other (See Comments)    cognitive   Lisinopril Cough    Family History  Problem Relation Age of Onset   Alzheimer's disease Mother    Pneumonia Mother    Heart failure Father    Heart disease Sister    Heart disease Brother    Heart disease Brother    Heart disease Sister    Pneumonia Sister   Reviewed on admission  Prior to Admission medications   Medication Sig Start Date End Date Taking? Authorizing Provider  acetaminophen (TYLENOL) 650 MG CR tablet Take 650 mg by mouth 2 (two) times daily.    [provider]  apixaban (ELIQUIS) 5 MG TABS tablet Take 1 tablet (5 mg total) by mouth 2 (two) times daily. 12/06/19   Rinehuls, David L, PA-C  escitalopram (LEXAPRO) 10 MG tablet Take 10 mg by mouth daily.    [provider]  metoprolol (TOPROL-XL) 200 MG 24 hr tablet Take 100 mg by mouth 2 (two) times daily.    [provider]  nitroGLYCERIN (NITROSTAT) 0.4 MG SL tablet PLACE 1 TABLET (0.4 MG TOTAL) UNDER THE TONGUE EVERY 5 (FIVE) MINUTES AS NEEDED FOR CHEST PAIN. 07/13/19 08/23/22  Patwardhan, Anabel Bene,  MD  olmesartan (BENICAR) 20 MG tablet Take 10 mg by mouth daily.    [provider]  REPATHA SURECLICK 140 MG/ML SOAJ Inject 140 mg into the skin See admin instructions. Take 140mg  SQ every two weeks per patient 10/12/19   [provider]    Physical Exam: Vitals:   11/19/22 1515 11/19/22 1530 11/19/22 1545 11/19/22 1549  BP: (!) 155/85 (!) 157/68 (!) 157/75   Pulse: 78 77 83   Resp: 17 15 17    Temp:    98 F (36.7 C)  TempSrc:    Oral  SpO2: 100% 98% 99%   Weight:      Height:        Physical Exam Constitutional:      General: She is not in acute distress.    Appearance: Normal  appearance.  HENT:     Head: Normocephalic and atraumatic.     Mouth/Throat:     Mouth: Mucous membranes are moist.     Pharynx: Oropharynx is clear.  Eyes:     Extraocular Movements: Extraocular movements intact.     Pupils: Pupils are equal, round, and reactive to light.  Cardiovascular:     Rate and Rhythm: Normal rate and regular rhythm.     Pulses: Normal pulses.     Heart sounds: Normal heart sounds.  Pulmonary:     Effort: Pulmonary effort is normal. No respiratory distress.     Breath sounds: Normal breath sounds.  Abdominal:     General: Bowel sounds are normal. There is no distension.     Palpations: Abdomen is soft.     Tenderness: There is no abdominal tenderness.  Musculoskeletal:        General: No swelling or deformity.  Skin:    General: Skin is warm and dry.  Neurological:     Comments: Patient more alert and responsive for me.  Does seem to have some word finding difficulty.  Unclear if this is old or new.  Does have history of prior stroke.  Unable to reach family.  Strength and sensation grossly intact.    Labs on Admission: I have personally reviewed following labs and imaging studies  CBC: Recent Labs  Lab 11/19/22 1140 11/19/22 1146  WBC 8.1  --   HGB 11.2* 12.2  HCT 36.1 36.0  MCV 100.0  --   PLT 226  --     Basic Metabolic  Panel: Recent Labs  Lab 11/19/22 1140 11/19/22 1146 11/19/22 1440  NA 136 138  --   K 3.1* 3.2*  --   CL 99 100  --   CO2 27  --   --   GLUCOSE 143* 142*  --   BUN 8 9  --   CREATININE 0.70 0.50  --   CALCIUM 10.5*  --   --   MG  --   --  1.7    GFR: Estimated Creatinine Clearance: 41.8 mL/min (by C-G formula based on SCr of 0.5 mg/dL).  Liver Function Tests: Recent Labs  Lab 11/19/22 1140  AST 20  ALT 11  ALKPHOS 58  BILITOT 0.9  PROT 7.0  ALBUMIN 3.7    Urine analysis:    Component Value Date/Time   COLORURINE YELLOW 11/19/2022 1240   APPEARANCEUR HAZY (A) 11/19/2022 1240   LABSPEC 1.011 11/19/2022 1240   PHURINE 8.0 11/19/2022 1240   GLUCOSEU NEGATIVE 11/19/2022 1240   HGBUR SMALL (A) 11/19/2022 1240   BILIRUBINUR NEGATIVE 11/19/2022 1240   KETONESUR NEGATIVE 11/19/2022 1240   PROTEINUR NEGATIVE 11/19/2022 1240   UROBILINOGEN 0.2 03/14/2009 1400   NITRITE POSITIVE (A) 11/19/2022 1240   LEUKOCYTESUR MODERATE (A) 11/19/2022 1240    Radiological Exams on Admission: CT ABDOMEN PELVIS W CONTRAST  Result Date: 11/19/2022 CLINICAL DATA:  Abdominal pain. EXAM: CT ABDOMEN AND PELVIS WITH CONTRAST TECHNIQUE: Multidetector CT imaging of the abdomen and pelvis was performed using the standard protocol following bolus administration of intravenous contrast. RADIATION DOSE REDUCTION: This exam was performed according to the departmental dose-optimization program which includes automated exposure control, adjustment of the mA and/or kV according to patient size and/or use of iterative reconstruction technique. CONTRAST:  75mL OMNIPAQUE IOHEXOL 350 MG/ML SOLN COMPARISON:  04/28/2022 and 03/26/2013 FINDINGS: Lower chest: Stable borderline cardiomegaly. Calcified plaque of the left anterior descending and right coronary arteries.  Thoracic aorta is normal in caliber. Mild calcified plaque throughout the descending thoracic aorta. Possible small pericardial cyst versus loculated  pleural fluid adjacent the inferior posterior right heart border unchanged from 2014. Mild bibasilar posterior dependent atelectasis. Two small nodular opacities along the right minor fissure with the larger measuring 5 mm. Hepatobiliary: 1.3 cm hypodensity of the right lobe of the liver unchanged and compatible with a cyst. Subcentimeter hypodensity of the inferior right lobe of the liver significantly decrease in size from 2014 and unchanged from 2023. Biliary tree and gallbladder are normal for. Pancreas: Normal. Spleen: Normal. Adrenals/Urinary Tract: Adrenal glands are normal. Kidneys are normal in size. No hydronephrosis or nephrolithiasis. 1.5 cm simple cyst of the upper pole right kidney. No follow-up imaging is recommended. Ureters and bladder are normal. Stomach/Bowel: Stomach and small bowel are normal. Appendix is normal. Diverticulosis of the sigmoid colon without active inflammation. Vascular/Lymphatic: Mild calcified plaque of the abdominal aorta which is normal in caliber. Remaining osseous structures unremarkable. No adenopathy. Reproductive: Uterus and bilateral adnexa are unremarkable. Other: No significant free fluid or focal inflammatory change. Musculoskeletal: Degenerative change of the spine and hips. No focal abnormality. IMPRESSION: 1. No acute findings in the abdomen/pelvis. 2. Diverticulosis of the sigmoid colon without active inflammation. 3. Two small liver hypodensities unchanged from 2014 and likely benign cysts. 4. 1.5 cm right renal cyst.  No follow-up imaging is recommended for 5. Aortic atherosclerosis. Atherosclerotic coronary artery disease. 6. Two small nodular opacities along the right minor fissure with the larger measuring 5 mm. No follow-up needed if patient is low-risk (and has no known or suspected primary neoplasm). Non-contrast chest CT can be considered in 12 months if patient is high-risk. This recommendation follows the consensus statement: Guidelines for Management  of Incidental Pulmonary Nodules Detected on CT Images: From the Fleischner Society 2017; Radiology 2017; [id number]:[id number]. Aortic Atherosclerosis (ICD10-I70.0). Electronically Signed   By: Elberta Fortis M.D.   On: 11/19/2022 14:27   DG Pelvis Portable  Result Date: 11/19/2022 CLINICAL DATA:  Fall EXAM: PORTABLE PELVIS 1-2 VIEWS COMPARISON:  None Available. FINDINGS: Bones appear demineralized. There is no evidence of pelvic fracture or diastasis. No evidence of hip fracture or dislocation. No pelvic bone lesions are seen. Atherosclerotic vascular calcifications. IMPRESSION: No acute osseous abnormality of the pelvis. Electronically Signed   By: Duanne Guess D.O.   On: 11/19/2022 12:45   DG Chest Portable 1 View  Result Date: 11/19/2022 CLINICAL DATA:  Pain after fall EXAM: PORTABLE CHEST 1 VIEW COMPARISON:  08/17/2022 x-ray and older FINDINGS: Overlapping cardiac leads. Osteopenia. No consolidation, pneumothorax or effusion. No edema. Normal cardiopericardial silhouette. Calcified aorta. Mild degenerative changes along the spine. IMPRESSION: No acute cardiopulmonary disease. Electronically Signed   By: Karen Kays M.D.   On: 11/19/2022 12:42   CT Head Wo Contrast  Result Date: 11/19/2022 CLINICAL DATA:  Head trauma.  Mental status change. EXAM: CT HEAD WITHOUT CONTRAST CT CERVICAL SPINE WITHOUT CONTRAST TECHNIQUE: Multidetector CT imaging of the head and cervical spine was performed following the standard protocol without intravenous contrast. Multiplanar CT image reconstructions of the cervical spine were also generated. RADIATION DOSE REDUCTION: This exam was performed according to the departmental dose-optimization program which includes automated exposure control, adjustment of the mA and/or kV according to patient size and/or use of iterative reconstruction technique. COMPARISON:  MRI 08/18/2022 FINDINGS: CT HEAD FINDINGS Brain: No evidence of acute infarction, hemorrhage, hydrocephalus, extra-axial  collection or mass lesion/mass effect. Stable appearance of calcified meningioma  overlying the right frontal lobe measuring 0.7 cm, image 17/5. There is mild diffuse low-attenuation within the subcortical and periventricular white matter compatible with chronic microvascular disease. Unchanged appearance of remote left parietal lobe infarct, image 17/3. Vascular: No hyperdense vessel or unexpected calcification. Skull: Normal. Negative for fracture or focal lesion. Sinuses/Orbits: Chronic mucosal thickening involving the maxillary sinuses, left greater than right. No sinus air-fluid levels. Mastoid air cells are clear. Other: None CT CERVICAL SPINE FINDINGS Alignment: No posttraumatic malalignment. First degree anterolisthesis of C4 on C5 is likely related to chronic spondylosis. Skull base and vertebrae: No acute fracture. No primary bone lesion or focal pathologic process. Soft tissues and spinal canal: No prevertebral fluid or swelling. No visible canal hematoma. Disc levels: Multilevel disc space narrowing and endplate spurring noted. This is most severe at C5-6 and C6-7. Bilateral facet arthropathy noted. Upper chest: Negative. Other: None IMPRESSION: 1. No acute intracranial abnormality. 2. Chronic small vessel ischemic disease and remote left parietal lobe infarct. 3. Stable appearance of calcified meningioma overlying the right frontal lobe. 4. No evidence for cervical spine fracture or subluxation. 5. Cervical degenerative disc disease and facet arthropathy. Electronically Signed   By: Signa Kell M.D.   On: 11/19/2022 12:07   CT Cervical Spine Wo Contrast  Result Date: 11/19/2022 CLINICAL DATA:  Head trauma.  Mental status change. EXAM: CT HEAD WITHOUT CONTRAST CT CERVICAL SPINE WITHOUT CONTRAST TECHNIQUE: Multidetector CT imaging of the head and cervical spine was performed following the standard protocol without intravenous contrast. Multiplanar CT image reconstructions of the cervical spine were  also generated. RADIATION DOSE REDUCTION: This exam was performed according to the departmental dose-optimization program which includes automated exposure control, adjustment of the mA and/or kV according to patient size and/or use of iterative reconstruction technique. COMPARISON:  MRI 08/18/2022 FINDINGS: CT HEAD FINDINGS Brain: No evidence of acute infarction, hemorrhage, hydrocephalus, extra-axial collection or mass lesion/mass effect. Stable appearance of calcified meningioma overlying the right frontal lobe measuring 0.7 cm, image 17/5. There is mild diffuse low-attenuation within the subcortical and periventricular white matter compatible with chronic microvascular disease. Unchanged appearance of remote left parietal lobe infarct, image 17/3. Vascular: No hyperdense vessel or unexpected calcification. Skull: Normal. Negative for fracture or focal lesion. Sinuses/Orbits: Chronic mucosal thickening involving the maxillary sinuses, left greater than right. No sinus air-fluid levels. Mastoid air cells are clear. Other: None CT CERVICAL SPINE FINDINGS Alignment: No posttraumatic malalignment. First degree anterolisthesis of C4 on C5 is likely related to chronic spondylosis. Skull base and vertebrae: No acute fracture. No primary bone lesion or focal pathologic process. Soft tissues and spinal canal: No prevertebral fluid or swelling. No visible canal hematoma. Disc levels: Multilevel disc space narrowing and endplate spurring noted. This is most severe at C5-6 and C6-7. Bilateral facet arthropathy noted. Upper chest: Negative. Other: None IMPRESSION: 1. No acute intracranial abnormality. 2. Chronic small vessel ischemic disease and remote left parietal lobe infarct. 3. Stable appearance of calcified meningioma overlying the right frontal lobe. 4. No evidence for cervical spine fracture or subluxation. 5. Cervical degenerative disc disease and facet arthropathy. Electronically Signed   By: Signa Kell M.D.    On: 11/19/2022 12:07    EKG: Independently reviewed.  Atrial fibrillation at 87 bpm.  Nonspecific T wave flattening.  QTc prolonged at 537.  Baseline wander.  Assessment/Plan Active Problems:   Chronic diastolic CHF (congestive heart failure) (HCC)   ANEMIA   Renal artery stenosis (HCC)   Essential hypertension  Paroxysmal atrial fibrillation (HCC)   Mixed hyperlipidemia   History of CVA (cerebrovascular accident)   History of breast cancer in female   Fall at home, initial encounter   UTI (urinary tract infection)   Acute encephalopathy   Acute encephalopathy Fall UTI > Patient presenting with several days of disorientation and then found down today by family for unknown period of time. > History of frequent UTI and prior confusion, appears to be history of dementia. > History obtained from EDP who got history from EMS who spoke to family.  EDP unable to reach family.  I was also unable to reach family at the number provided.  Patient improving between EDP exam and my exam. > Imaging workup for possible trauma was negative in the ED.  No leukocytosis but does have urinalysis with nitrate, leukocytes, bacteria may just be delayed and mounting immune response given age.  Has history of prior UTI and confusion. > The patient has had symptoms for several days unclear when the word finding may have started as this is what she has residual after she is more alert and responsive; noted to be without aphasia on exam in 2021 despite having presented with aphasia in 2019.  CT head without acute abnormality.  Given unclear Chronicity unable to be confirmed, will pursue MR brain. > Started on ceftriaxone in the ED - Monitor on telemetry overnight - Continue with ceftriaxone - Follow-up urine cultures - Rule out stroke with MRI brain  Hypertension - Continue home metoprolol - Replace home olmesartan with formulary irbesartan  Atrial fibrillation - Continue metoprolol and Eliquis  History  of CVA Hyperlipidemia - Continue home Repatha - On Eliquis as above  History of vertebral artery stenosis History of renal artery stenosis - On Repatha and Eliquis as above  Chronic diastolic CHF > Last echo in 2023 with EF 55-60%, G1 DD. - Not on any diuretic - Continue ARB and metoprolol as above.  History of breast cancer - Noted  DVT prophylaxis: Eliquis Code Status:   Full, unable to reach family to confirm DNR status recorded previously. Family Communication:  Unable to reach by phone Disposition Plan:   Patient is from:  Home  Anticipated DC to:  Home  Anticipated DC date:  1 to 4 days  Anticipated DC barriers: None  Consults called:  None Admission status:  Observation, telemetry  Severity of Illness: The appropriate patient status for this patient is OBSERVATION. Observation status is judged to be reasonable and necessary in order to provide the required intensity of service to ensure the patient's safety. The patient's presenting symptoms, physical exam findings, and initial radiographic and laboratory data in the context of their medical condition is felt to place them at decreased risk for further clinical deterioration. Furthermore, it is anticipated that the patient will be medically stable for discharge from the hospital within 2 midnights of admission.    Synetta Fail MD [facility]  How to contact the [facility] Attending or Consulting provider 7A - 7P or covering provider during after hours 7P -7A, for this patient?   Check the care team in [facility] and look for a) attending/consulting [facility] provider listed and b) the [facility] team listed Log into [email or web address] and use [place]'s universal password to access. If you do not have the password, please contact the hospital operator. Locate the [facility] provider you are looking for under [facility] and page to a number that you can be directly reached. If you still have difficulty  reaching the provider, please  page the [facility] (Director on Call) for the Hospitalists listed on amion for assistance.  11/19/2022, 3:56 PM

## 2022-11-19 NOTE — ED Notes (Signed)
ED TO INPATIENT HANDOFF REPORT  ED Nurse Name and Phone #: Coral Else Name/Age/Gender Jeanette [name] 88 y.o. female Room/Bed: 037C/037C  Code Status   Code Status: Full Code  Home/SNF/Other Home Patient oriented to: self Is this baseline? Yes   Triage Complete: Triage complete  Chief Complaint Acute encephalopathy [G93.40]  Triage Note Pt arrives via EMS from home with a fall at home this morning. Pt found on the floor by family. Per pts family she has been disoriented for the last few days. Noted to smell like urine. Has frequent UTIs. Pt with ccollar in place, unable to answer any questions or state name at present. Pt sitting alert in stretcher no distress noted. C-collar placed by EMS. Vitals HR 62, bp 162/70, cbg 160, 98% on 2L, RR 18. 18g LAC   Allergies Allergies  Allergen Reactions   Epinephrine Anaphylaxis    Shortness of breath and increased heart rate   Cozaar [Losartan] Other (See Comments)    increased air hunger and dizziness.   Crestor [Rosuvastatin] Other (See Comments)    memory problems.   Ezetimibe Other (See Comments)   Galantamine Hydrobromide Other (See Comments)    wt loss and dizziness   Iron Diarrhea   Pregabalin Other (See Comments)   Septra [Sulfamethoxazole-Trimethoprim] Other (See Comments)    2017 had a strange reaction she says.   Vytorin [Ezetimibe-Simvastatin] Other (See Comments)    unknown   Benazepril Hcl Cough   Crestor [Rosuvastatin Calcium] Other (See Comments)    cognitive   Lisinopril Cough    Level of Care/Admitting Diagnosis ED Disposition     ED Disposition  Admit   Condition  --   Comment  Hospital Area: [facility] [[id number]]  Level of Care: Telemetry Medical [104]  May place patient in observation at [facility] or [facility] if equivalent level of care is available:: No  Covid Evaluation: Asymptomatic - no recent exposure (last 10 days) testing not required  Diagnosis: Acute encephalopathy  [[id number]]  Admitting Physician: Synetta Fail [[id number]]  Attending Physician: Synetta Fail [[id number]]          B Medical/Surgery History Past Medical History:  Diagnosis Date   A-fib (HCC) 03/15/2018   Acute encephalopathy 08/16/2022   Anal cancer (HCC)    Anxiety    Arthritis    Breast cancer (HCC)    Breast cancer (HCC)    Cancer of breast (HCC)    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 LV EF 74%.   GYNECOLOGIC CRYOSURGERY     IR CT HEAD LTD  12/04/2019   IR PERCUTANEOUS ART THROMBECTOMY/INFUSION INTRACRANIAL INC DIAG ANGIO  12/04/2019   IR US GUIDE VASC ACCESS LEFT  12/04/2019   OPEN REDUCTION INTERNAL FIXATION (ORIF) DISTAL RADIAL FRACTURE Left 02/19/2017   Procedure: OPEN REDUCTION INTERNAL FIXATION (ORIF) LEFT DISTAL RADIAL FRACTURE;  Surgeon: Betha Loa, MD;  Location: [facility];  Service: Orthopedics;  Laterality: Left;   OVARIAN CYST REMOVAL     RADIOLOGY WITH ANESTHESIA N/A 12/04/2019   Procedure: IR WITH ANESTHESIA;  Surgeon: Julieanne Cotton, MD;  Location: [facility] OR;  Service: Radiology;  Laterality: N/A;   RENAL DOPPLER  09/09/2012   Right proximal renal artery: 60-99% diameter reduction. Normal patency of the left main renal artery.    TRANSTHORACIC ECHOCARDIOGRAM  03/22/2006   EF 55-60%, mild mitral valvular regurg, pulmonary artery systolic pressure was moderately increased.   TUBAL LIGATION       A IV  Location/Drains/Wounds Patient Lines/Drains/Airways Status     Active Line/Drains/Airways     Name Placement date Placement time Site Days   Peripheral IV 11/19/22 Left Antecubital 11/19/22  --  Antecubital  less than 1            Intake/Output Last 24 hours  Intake/Output Summary (Last 24 hours) at 11/19/2022 2213 Last data filed at 11/19/2022 1813 Gross per 24 hour  Intake 1287.26 ml  Output --  Net 1287.26 ml    Labs/Imaging Results for orders placed or performed during the hospital encounter of 11/19/22 (from the past 48 hour(s))  Comprehensive metabolic panel     Status: Abnormal   Collection Time: 11/19/22 11:40 AM  Result Value Ref Range   Sodium 136 135 - 145 mmol/L   Potassium 3.1 (L) 3.5 - 5.1 mmol/L   Chloride 99 98 - 111 mmol/L   CO2 27 22 - 32 mmol/L   Glucose, Bld 143 (H) 70 - 99 mg/dL    Comment: Glucose reference range applies only to samples taken after fasting for at least 8 hours.   BUN 8 8 - 23 mg/dL   Creatinine, Ser 1.61 0.44 - 1.00 mg/dL   Calcium 09.6 (H) 8.9 - 10.3 mg/dL   Total Protein 7.0 6.5 - 8.1 g/dL   Albumin 3.7 3.5 - 5.0 g/dL   AST 20 15 - 41 U/L   ALT 11 0 - 44 U/L   Alkaline Phosphatase 58 38 - 126 U/L   Total Bilirubin 0.9 0.3 - 1.2 mg/dL   GFR, Estimated >04 >54 mL/min    Comment: (NOTE) Calculated using the CKD-EPI Creatinine Equation (2021)    Anion gap 10 5 - 15    Comment: Performed at [facility], [address], [place], [place] [address]  CBC     Status: Abnormal   Collection Time: 11/19/22 11:40 AM  Result Value Ref Range   WBC 8.1 4.0 - 10.5 K/uL   RBC 3.61 (L) 3.87 - 5.11 MIL/uL   Hemoglobin 11.2 (L) 12.0 - 15.0 g/dL   HCT 91.4 78.2 - 95.6 %   MCV 100.0 80.0 - 100.0 fL   MCH 31.0 26.0 - 34.0 pg   MCHC 31.0 30.0 - 36.0 g/dL   RDW 21.3 08.6 - 57.8 %   Platelets 226 150 - 400 K/uL   nRBC 0.0 0.0 - 0.2 %    Comment: Performed at [facility], [address], [place], [place] [address]  Troponin I  (High Sensitivity)     Status: Abnormal   Collection Time: 11/19/22 11:40 AM  Result Value Ref Range   Troponin I (High Sensitivity) 18 (H) <18 ng/L    Comment: (NOTE) Elevated high sensitivity troponin I (hsTnI) values and significant  changes across serial measurements may suggest ACS but many other  chronic and acute  conditions are known to elevate hsTnI results.  Refer to the "Links" section for chest pain algorithms and additional  guidance. Performed at [facility], [address] [place], [place] [address]   CK     Status: None   Collection Time: 11/19/22 11:40 AM  Result Value Ref Range   Total CK 60 38 - 234 U/L    Comment: Performed at [facility], [address] [place], [place] [address]  Lactic acid, plasma     Status: Abnormal   Collection Time: 11/19/22 11:40 AM  Result Value Ref Range   Lactic Acid, Venous 2.6 (HH) 0.5 - 1.9 mmol/L    Comment: CRITICAL RESULT CALLED TO, READ BACK BY AND VERIFIED WITH Kerby Moors, RN AT 1314 05.13.24 D. BLU Performed at [facility], [address] [place], [place] [address]   I-stat chem 8, ED (not at [facility], [facility] or [facility])     Status: Abnormal   Collection Time: 11/19/22 11:46 AM  Result Value Ref Range   Sodium 138 135 - 145 mmol/L   Potassium 3.2 (L) 3.5 - 5.1 mmol/L   Chloride 100 98 - 111 mmol/L   BUN 9 8 - 23 mg/dL   Creatinine, Ser 9.14 0.44 - 1.00 mg/dL   Glucose, Bld 782 (H) 70 - 99 mg/dL    Comment: Glucose reference range applies only to samples taken after fasting for at least 8 hours.   Calcium, Ion 1.32 1.15 - 1.40 mmol/L   TCO2 28 22 - 32 mmol/L   Hemoglobin 12.2 12.0 - 15.0 g/dL   HCT 95.6 21.3 - 08.6 %  CBG monitoring, ED     Status: Abnormal   Collection Time: 11/19/22 12:07 PM  Result Value Ref Range   Glucose-Capillary 132 (H) 70 - 99 mg/dL    Comment: Glucose reference range applies only to samples taken after fasting for at least 8 hours.  Urinalysis, w/ Reflex to Culture (Infection  Suspected) -Urine, Clean Catch     Status: Abnormal   Collection Time: 11/19/22 12:40 PM  Result Value Ref Range   Specimen Source URINE, CLEAN CATCH    Color, Urine YELLOW YELLOW   APPearance HAZY (A) CLEAR   Specific Gravity, Urine 1.011 1.005 - 1.030   pH 8.0 5.0 - 8.0   Glucose, UA NEGATIVE NEGATIVE mg/dL   Hgb urine dipstick SMALL (A) NEGATIVE   Bilirubin Urine NEGATIVE NEGATIVE   Ketones, ur NEGATIVE NEGATIVE mg/dL   Protein, ur NEGATIVE NEGATIVE mg/dL   Nitrite POSITIVE (A) NEGATIVE   Leukocytes,Ua MODERATE (A) NEGATIVE   RBC / HPF 0-5 0 - 5 RBC/hpf   WBC, UA >50 0 - 5 WBC/hpf    Comment:        Reflex urine culture not performed if WBC <=10, OR if Squamous epithelial cells >5. If Squamous epithelial cells >5 suggest recollection.    Bacteria, UA MANY (A) NONE SEEN   Squamous Epithelial / HPF 0-5 0 - 5 /HPF   WBC Clumps PRESENT    Mucus PRESENT     Comment: Performed at [facility], [address], [place], [place] [address]  Rapid urine drug screen (hospital performed)     Status: None   Collection Time: 11/19/22 12:40 PM  Result Value Ref Range   Opiates NONE DETECTED NONE DETECTED   Cocaine NONE DETECTED NONE DETECTED   Benzodiazepines NONE DETECTED NONE DETECTED   Amphetamines NONE DETECTED NONE DETECTED  Tetrahydrocannabinol NONE DETECTED NONE DETECTED   Barbiturates NONE DETECTED NONE DETECTED    Comment: (NOTE) DRUG SCREEN FOR MEDICAL PURPOSES ONLY.  IF CONFIRMATION IS NEEDED FOR ANY PURPOSE, NOTIFY LAB WITHIN 5 DAYS.  LOWEST DETECTABLE LIMITS FOR URINE DRUG SCREEN Drug Class                     Cutoff (ng/mL) Amphetamine and metabolites    1000 Barbiturate and metabolites    200 Benzodiazepine                 200 Opiates and metabolites        300 Cocaine and metabolites        300 THC                            50 Performed at [facility], [address] [place], [place] [address]   Lactic acid, plasma     Status: None    Collection Time: 11/19/22  2:40 PM  Result Value Ref Range   Lactic Acid, Venous 1.7 0.5 - 1.9 mmol/L    Comment: Performed at [facility], [address], [place], [place] [address]  Magnesium     Status: None   Collection Time: 11/19/22  2:40 PM  Result Value Ref Range   Magnesium 1.7 1.7 - 2.4 mg/dL    Comment: Performed at [facility], [address], [place], [place] [address]  Troponin I (High Sensitivity)     Status: Abnormal   Collection Time: 11/19/22  2:40 PM  Result Value Ref Range   Troponin I (High Sensitivity) 21 (H) <18 ng/L    Comment: (NOTE) Elevated high sensitivity troponin I (hsTnI) values and significant  changes across serial measurements may suggest ACS but many other  chronic and acute conditions are known to elevate hsTnI results.  Refer to the "Links" section for chest pain algorithms and additional  guidance. Performed at [facility], [address] [place], [place] [address]    MR BRAIN WO CONTRAST  Result Date: 11/19/2022 CLINICAL DATA:  Neuro deficit, acute, stroke suspected. Previous strokes. Patient was found down. Patient has been disoriented for several days. EXAM: MRI HEAD WITHOUT CONTRAST TECHNIQUE: Multiplanar, multiecho pulse sequences of the brain and surrounding structures were obtained without intravenous contrast. COMPARISON:  CT head without contrast 11/19/2022 MR head without contrast 08/18/2022 FINDINGS: Brain: No acute infarct, hemorrhage, or mass lesion is present. The ventricles are of normal size. No significant extraaxial fluid collection is present. Moderate atrophy and white matter changes are noted. A remote left parietal infarct is present. Remote infarcts of the cerebellum bilaterally are stable. The brainstem is unremarkable. The internal auditory canals are within normal limits. Midline structures are within normal limits. Vascular: Flow is present in the major intracranial arteries. Skull and upper cervical  spine: The craniocervical junction is normal. Upper cervical spine is within normal limits. Marrow signal is unremarkable. Sinuses/Orbits: The paranasal sinuses and mastoid air cells are clear. Bilateral lens replacements are noted. Globes and orbits are otherwise unremarkable. IMPRESSION: 1. No acute intracranial abnormality or significant interval change. 2. Moderate atrophy and white matter disease likely reflects the sequela of chronic microvascular ischemia. 3. Remote left parietal infarct. 4. Remote infarcts of the cerebellum bilaterally. Electronically Signed   By: Marin Roberts M.D.   On: 11/19/2022 19:28   CT ABDOMEN PELVIS W CONTRAST  Result Date: 11/19/2022 CLINICAL DATA:  Abdominal pain. EXAM: CT ABDOMEN AND PELVIS WITH CONTRAST TECHNIQUE: Multidetector CT imaging of the abdomen and pelvis was performed using the standard protocol following bolus administration of intravenous contrast. RADIATION DOSE REDUCTION: This exam was performed according to the departmental dose-optimization program which includes automated exposure control, adjustment of the mA and/or kV according to patient size and/or use of iterative reconstruction technique. CONTRAST:  75mL OMNIPAQUE IOHEXOL 350 MG/ML SOLN COMPARISON:  04/28/2022 and 03/26/2013 FINDINGS: Lower chest: Stable borderline cardiomegaly. Calcified plaque of the left anterior descending and right coronary arteries. Thoracic aorta is normal in caliber. Mild calcified plaque throughout the descending thoracic aorta. Possible small pericardial cyst versus loculated pleural fluid adjacent the inferior posterior right heart border unchanged from 2014. Mild bibasilar posterior dependent atelectasis. Two small nodular opacities along the right minor fissure with the larger measuring 5 mm. Hepatobiliary: 1.3 cm hypodensity of the right lobe of the liver unchanged and compatible with a cyst. Subcentimeter hypodensity of the inferior right lobe of the liver  significantly decrease in size from 2014 and unchanged from 2023. Biliary tree and gallbladder are normal for. Pancreas: Normal. Spleen: Normal. Adrenals/Urinary Tract: Adrenal glands are normal. Kidneys are normal in size. No hydronephrosis or nephrolithiasis. 1.5 cm simple cyst of the upper pole right kidney. No follow-up imaging is recommended. Ureters and bladder are normal. Stomach/Bowel: Stomach and small bowel are normal. Appendix is normal. Diverticulosis of the sigmoid colon without active inflammation. Vascular/Lymphatic: Mild calcified plaque of the abdominal aorta which is normal in caliber. Remaining osseous structures unremarkable. No adenopathy. Reproductive: Uterus and bilateral adnexa are unremarkable. Other: No significant free fluid or focal inflammatory change. Musculoskeletal: Degenerative change of the spine and hips. No focal abnormality. IMPRESSION: 1. No acute findings in the abdomen/pelvis. 2. Diverticulosis of the sigmoid colon without active inflammation. 3. Two small liver hypodensities unchanged from 2014 and likely benign cysts. 4. 1.5 cm right renal cyst.  No follow-up imaging is recommended for 5. Aortic atherosclerosis. Atherosclerotic coronary artery disease. 6. Two small nodular opacities along the right minor fissure with the larger measuring 5 mm. No follow-up needed if patient is low-risk (and has no known or suspected primary neoplasm). Non-contrast chest CT can be considered in 12 months if patient is high-risk. This recommendation follows the consensus statement: Guidelines for Management of Incidental Pulmonary Nodules Detected on CT Images: From the Fleischner Society 2017; Radiology 2017; 284:228-243. Aortic Atherosclerosis (ICD10-I70.0). Electronically Signed   By: Elberta Fortis M.D.   On: 11/19/2022 14:27   DG Pelvis Portable  Result Date: 11/19/2022 CLINICAL DATA:  Fall EXAM: PORTABLE PELVIS 1-2 VIEWS COMPARISON:  None Available. FINDINGS: Bones appear  demineralized. There is no evidence of pelvic fracture or diastasis. No evidence of hip fracture or dislocation. No pelvic bone lesions are seen. Atherosclerotic vascular calcifications. IMPRESSION: No acute osseous abnormality of the pelvis. Electronically Signed   By: Duanne Guess D.O.   On: 11/19/2022 12:45   DG Chest Portable 1 View  Result Date: 11/19/2022 CLINICAL DATA:  Pain after fall EXAM: PORTABLE CHEST 1 VIEW COMPARISON:  08/17/2022 x-ray and older FINDINGS: Overlapping cardiac leads. Osteopenia. No consolidation, pneumothorax or effusion. No edema. Normal cardiopericardial silhouette. Calcified aorta. Mild degenerative changes along the spine. IMPRESSION: No acute cardiopulmonary disease. Electronically Signed   By: Karen Kays M.D.   On: 11/19/2022 12:42   CT Head Wo Contrast  Result Date: 11/19/2022 CLINICAL DATA:  Head trauma.  Mental status change. EXAM: CT  HEAD WITHOUT CONTRAST CT CERVICAL SPINE WITHOUT CONTRAST TECHNIQUE: Multidetector CT imaging of the head and cervical spine was performed following the standard protocol without intravenous contrast. Multiplanar CT image reconstructions of the cervical spine were also generated. RADIATION DOSE REDUCTION: This exam was performed according to the departmental dose-optimization program which includes automated exposure control, adjustment of the mA and/or kV according to patient size and/or use of iterative reconstruction technique. COMPARISON:  MRI 08/18/2022 FINDINGS: CT HEAD FINDINGS Brain: No evidence of acute infarction, hemorrhage, hydrocephalus, extra-axial collection or mass lesion/mass effect. Stable appearance of calcified meningioma overlying the right frontal lobe measuring 0.7 cm, image 17/5. There is mild diffuse low-attenuation within the subcortical and periventricular white matter compatible with chronic microvascular disease. Unchanged appearance of remote left parietal lobe infarct, image 17/3. Vascular: No hyperdense  vessel or unexpected calcification. Skull: Normal. Negative for fracture or focal lesion. Sinuses/Orbits: Chronic mucosal thickening involving the maxillary sinuses, left greater than right. No sinus air-fluid levels. Mastoid air cells are clear. Other: None CT CERVICAL SPINE FINDINGS Alignment: No posttraumatic malalignment. First degree anterolisthesis of C4 on C5 is likely related to chronic spondylosis. Skull base and vertebrae: No acute fracture. No primary bone lesion or focal pathologic process. Soft tissues and spinal canal: No prevertebral fluid or swelling. No visible canal hematoma. Disc levels: Multilevel disc space narrowing and endplate spurring noted. This is most severe at C5-6 and C6-7. Bilateral facet arthropathy noted. Upper chest: Negative. Other: None IMPRESSION: 1. No acute intracranial abnormality. 2. Chronic small vessel ischemic disease and remote left parietal lobe infarct. 3. Stable appearance of calcified meningioma overlying the right frontal lobe. 4. No evidence for cervical spine fracture or subluxation. 5. Cervical degenerative disc disease and facet arthropathy. Electronically Signed   By: Signa Kell M.D.   On: 11/19/2022 12:07   CT Cervical Spine Wo Contrast  Result Date: 11/19/2022 CLINICAL DATA:  Head trauma.  Mental status change. EXAM: CT HEAD WITHOUT CONTRAST CT CERVICAL SPINE WITHOUT CONTRAST TECHNIQUE: Multidetector CT imaging of the head and cervical spine was performed following the standard protocol without intravenous contrast. Multiplanar CT image reconstructions of the cervical spine were also generated. RADIATION DOSE REDUCTION: This exam was performed according to the departmental dose-optimization program which includes automated exposure control, adjustment of the mA and/or kV according to patient size and/or use of iterative reconstruction technique. COMPARISON:  MRI 08/18/2022 FINDINGS: CT HEAD FINDINGS Brain: No evidence of acute infarction, hemorrhage,  hydrocephalus, extra-axial collection or mass lesion/mass effect. Stable appearance of calcified meningioma overlying the right frontal lobe measuring 0.7 cm, image 17/5. There is mild diffuse low-attenuation within the subcortical and periventricular white matter compatible with chronic microvascular disease. Unchanged appearance of remote left parietal lobe infarct, image 17/3. Vascular: No hyperdense vessel or unexpected calcification. Skull: Normal. Negative for fracture or focal lesion. Sinuses/Orbits: Chronic mucosal thickening involving the maxillary sinuses, left greater than right. No sinus air-fluid levels. Mastoid air cells are clear. Other: None CT CERVICAL SPINE FINDINGS Alignment: No posttraumatic malalignment. First degree anterolisthesis of C4 on C5 is likely related to chronic spondylosis. Skull base and vertebrae: No acute fracture. No primary bone lesion or focal pathologic process. Soft tissues and spinal canal: No prevertebral fluid or swelling. No visible canal hematoma. Disc levels: Multilevel disc space narrowing and endplate spurring noted. This is most severe at C5-6 and C6-7. Bilateral facet arthropathy noted. Upper chest: Negative. Other: None IMPRESSION: 1. No acute intracranial abnormality. 2. Chronic small vessel ischemic disease and remote  left parietal lobe infarct. 3. Stable appearance of calcified meningioma overlying the right frontal lobe. 4. No evidence for cervical spine fracture or subluxation. 5. Cervical degenerative disc disease and facet arthropathy. Electronically Signed   By: Signa Kell M.D.   On: 11/19/2022 12:07    Pending Labs Unresulted Labs (From admission, onward)     Start     Ordered   11/20/22 0500  Comprehensive metabolic panel  Tomorrow morning,   R        11/19/22 1556   11/20/22 0500  CBC  Tomorrow morning,   R        11/19/22 1556   11/19/22 1240  Urine Culture  Once,   R        11/19/22 1240   11/19/22 1218  Ammonia  Add-on,   AD         11/19/22 1217   11/19/22 1126  Ethanol  Once,   URGENT        11/19/22 1126            Vitals/Pain Today's Vitals   11/19/22 1958 11/19/22 2000 11/19/22 2100 11/19/22 2200  BP: (!) 145/70 (!) 147/61 (!) 154/107 (!) 157/124  Pulse: 76 74 75 83  Resp: 19 20 14 19   Temp:      TempSrc:      SpO2: 96% 99% 98% 100%  Weight:      Height:      PainSc:        Isolation Precautions No active isolations  Medications Medications  apixaban (ELIQUIS) tablet 5 mg (has no administration in time range)  metoprolol succinate (TOPROL-XL) 24 hr tablet 100 mg (has no administration in time range)  irbesartan (AVAPRO) tablet 150 mg (has no administration in time range)  sodium chloride flush (NS) 0.9 % injection 3 mL (3 mLs Intravenous Not Given 11/19/22 2101)  0.9 %  sodium chloride infusion ( Intravenous New Bag/Given 11/19/22 1615)  acetaminophen (TYLENOL) tablet 650 mg (has no administration in time range)    Or  acetaminophen (TYLENOL) suppository 650 mg (has no administration in time range)  polyethylene glycol (MIRALAX / GLYCOLAX) packet 17 g (has no administration in time range)  cefTRIAXone (ROCEPHIN) 1 g in sodium chloride 0.9 % 100 mL IVPB (has no administration in time range)  iohexol (OMNIPAQUE) 350 MG/ML injection 75 mL (75 mLs Intravenous Contrast Given 11/19/22 1355)  cefTRIAXone (ROCEPHIN) 1 g in sodium chloride 0.9 % 100 mL IVPB (0 g Intravenous Stopped 11/19/22 1542)  sodium chloride 0.9 % bolus 1,000 mL (0 mLs Intravenous Stopped 11/19/22 1529)  potassium chloride 10 mEq in 100 mL IVPB (0 mEq Intravenous Stopped 11/19/22 2152)    Mobility walks with device     Focused Assessments    R Recommendations: See Admitting Provider Note  Report given to:   Additional Notes: Pleasant patient. Incontinent, hx of dementia. Alert to herself. Cooperative, calm.

## 2022-11-19 NOTE — ED Triage Notes (Signed)
Pt arrives via EMS from home with a fall at home this morning. Pt found on the floor by family. Per pts family she has been disoriented for the last few days. Noted to smell like urine. Has frequent UTIs. Pt with ccollar in place, unable to answer any questions or state name at present. Pt sitting alert in stretcher no distress noted. C-collar placed by EMS. Vitals HR 62, bp 162/70, cbg 160, 98% on 2L, RR 18. 18g LAC

## 2022-11-19 NOTE — ED Notes (Signed)
ED TO INPATIENT HANDOFF REPORT  ED Nurse Name and Phone #: cori [phone]  S Name/Age/Gender Jeanette [name] 88 y.o. female Room/Bed: 037C/037C  Code Status   Code Status: Full Code  Home/SNF/Other Home Patient oriented to: self Is this baseline?  unknown  Triage Complete: Triage complete  Chief Complaint Acute encephalopathy [G93.40]  Triage Note Pt arrives via EMS from home with a fall at home this morning. Pt found on the floor by family. Per pts family she has been disoriented for the last few days. Noted to smell like urine. Has frequent UTIs. Pt with ccollar in place, unable to answer any questions or state name at present. Pt sitting alert in stretcher no distress noted. C-collar placed by EMS. Vitals HR 62, bp 162/70, cbg 160, 98% on 2L, RR 18. 18g LAC   Allergies Allergies  Allergen Reactions   Epinephrine Anaphylaxis    Shortness of breath and increased heart rate   Cozaar [Losartan] Other (See Comments)    increased air hunger and dizziness.   Crestor [Rosuvastatin] Other (See Comments)    memory problems.   Ezetimibe Other (See Comments)   Galantamine Hydrobromide Other (See Comments)    wt loss and dizziness   Iron Diarrhea   Pregabalin Other (See Comments)   Septra [Sulfamethoxazole-Trimethoprim] Other (See Comments)    2017 had a strange reaction she says.   Vytorin [Ezetimibe-Simvastatin] Other (See Comments)    unknown   Benazepril Hcl Cough   Crestor [Rosuvastatin Calcium] Other (See Comments)    cognitive   Lisinopril Cough    Level of Care/Admitting Diagnosis ED Disposition     ED Disposition  Admit   Condition  --   Comment  Hospital Area: [facility] [[id number]]  Level of Care: Telemetry Medical [104]  May place patient in observation at [facility] or [place] if equivalent level of care is available:: No  Covid Evaluation: Asymptomatic - no recent exposure (last 10 days) testing not required  Diagnosis: Acute  encephalopathy [960454]  Admitting Physician: Synetta Fail [[id number]]  Attending Physician: Synetta Fail [[id number]]          B Medical/Surgery History Past Medical History:  Diagnosis Date   A-fib (HCC) 03/15/2018   Acute encephalopathy 08/16/2022   Anal cancer (HCC)    Anxiety    Arthritis    Breast cancer (HCC)    Breast cancer (HCC)    Cancer of breast (HCC)    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 LV EF 74%.   GYNECOLOGIC CRYOSURGERY     IR CT HEAD LTD  12/04/2019   IR PERCUTANEOUS ART THROMBECTOMY/INFUSION INTRACRANIAL INC DIAG ANGIO  12/04/2019   IR US GUIDE VASC ACCESS LEFT  12/04/2019   OPEN REDUCTION INTERNAL FIXATION (ORIF) DISTAL RADIAL FRACTURE Left 02/19/2017   Procedure: OPEN REDUCTION INTERNAL FIXATION (ORIF) LEFT DISTAL RADIAL FRACTURE;  Surgeon: Betha Loa, MD;  Location: [facility];  Service: Orthopedics;  Laterality: Left;   OVARIAN CYST REMOVAL     RADIOLOGY WITH ANESTHESIA N/A 12/04/2019   Procedure: IR WITH ANESTHESIA;  Surgeon: Julieanne Cotton, MD;  Location: [facility] OR;  Service: Radiology;  Laterality: N/A;   RENAL DOPPLER  09/09/2012   Right proximal renal artery: 60-99% diameter reduction. Normal patency of the left main renal artery.    TRANSTHORACIC ECHOCARDIOGRAM  03/22/2006   EF 55-60%, mild mitral valvular regurg, pulmonary artery systolic pressure was moderately increased.   TUBAL LIGATION       A IV  Location/Drains/Wounds Patient Lines/Drains/Airways Status     Active Line/Drains/Airways     Name Placement date Placement time Site Days   Peripheral IV 11/19/22 Left Antecubital 11/19/22  --  Antecubital  less than 1            Intake/Output Last 24 hours  Intake/Output Summary (Last 24 hours) at 11/19/2022 1756 Last data filed at 11/19/2022 1710 Gross per 24 hour  Intake 1187.26 ml  Output --  Net 1187.26 ml    Labs/Imaging Results for orders placed or performed during the hospital encounter of 11/19/22 (from the past 48 hour(s))  Comprehensive metabolic panel     Status: Abnormal   Collection Time: 11/19/22 11:40 AM  Result Value Ref Range   Sodium 136 135 - 145 mmol/L   Potassium 3.1 (L) 3.5 - 5.1 mmol/L   Chloride 99 98 - 111 mmol/L   CO2 27 22 - 32 mmol/L   Glucose, Bld 143 (H) 70 - 99 mg/dL    Comment: Glucose reference range applies only to samples taken after fasting for at least 8 hours.   BUN 8 8 - 23 mg/dL   Creatinine, Ser 1.61 0.44 - 1.00 mg/dL   Calcium 09.6 (H) 8.9 - 10.3 mg/dL   Total Protein 7.0 6.5 - 8.1 g/dL   Albumin 3.7 3.5 - 5.0 g/dL   AST 20 15 - 41 U/L   ALT 11 0 - 44 U/L   Alkaline Phosphatase 58 38 - 126 U/L   Total Bilirubin 0.9 0.3 - 1.2 mg/dL   GFR, Estimated >04 >54 mL/min    Comment: (NOTE) Calculated using the CKD-EPI Creatinine Equation (2021)    Anion gap 10 5 - 15    Comment: Performed at [facility], [address], [place], [place] [address]  CBC     Status: Abnormal   Collection Time: 11/19/22 11:40 AM  Result Value Ref Range   WBC 8.1 4.0 - 10.5 K/uL   RBC 3.61 (L) 3.87 - 5.11 MIL/uL   Hemoglobin 11.2 (L) 12.0 - 15.0 g/dL   HCT 91.4 78.2 - 95.6 %   MCV 100.0 80.0 - 100.0 fL   MCH 31.0 26.0 - 34.0 pg   MCHC 31.0 30.0 - 36.0 g/dL   RDW 21.3 08.6 - 57.8 %   Platelets 226 150 - 400 K/uL   nRBC 0.0 0.0 - 0.2 %    Comment: Performed at [facility], [address] [place], [place] [address]  Troponin I  (High Sensitivity)     Status: Abnormal   Collection Time: 11/19/22 11:40 AM  Result Value Ref Range   Troponin I (High Sensitivity) 18 (H) <18 ng/L    Comment: (NOTE) Elevated high sensitivity troponin I (hsTnI) values and significant  changes across serial measurements may suggest ACS but many other  chronic and acute  conditions are known to elevate hsTnI results.  Refer to the "Links" section for chest pain algorithms and additional  guidance. Performed at [facility], [address] [place], [place] [address]   CK     Status: None   Collection Time: 11/19/22 11:40 AM  Result Value Ref Range   Total CK 60 38 - 234 U/L    Comment: Performed at [facility], [address], [place], [place] [address]  Lactic acid, plasma     Status: Abnormal   Collection Time: 11/19/22 11:40 AM  Result Value Ref Range   Lactic Acid, Venous 2.6 (HH) 0.5 - 1.9 mmol/L    Comment: CRITICAL RESULT CALLED TO, READ BACK BY AND VERIFIED WITH Kerby Moors, RN AT 1314 05.13.24 D. BLU Performed at [facility], [address] [place], [place] [address]   I-stat chem 8, ED (not at [facility], [facility] or [facility])     Status: Abnormal   Collection Time: 11/19/22 11:46 AM  Result Value Ref Range   Sodium 138 135 - 145 mmol/L   Potassium 3.2 (L) 3.5 - 5.1 mmol/L   Chloride 100 98 - 111 mmol/L   BUN 9 8 - 23 mg/dL   Creatinine, Ser 9.14 0.44 - 1.00 mg/dL   Glucose, Bld 782 (H) 70 - 99 mg/dL    Comment: Glucose reference range applies only to samples taken after fasting for at least 8 hours.   Calcium, Ion 1.32 1.15 - 1.40 mmol/L   TCO2 28 22 - 32 mmol/L   Hemoglobin 12.2 12.0 - 15.0 g/dL   HCT 95.6 21.3 - 08.6 %  CBG monitoring, ED     Status: Abnormal   Collection Time: 11/19/22 12:07 PM  Result Value Ref Range   Glucose-Capillary 132 (H) 70 - 99 mg/dL    Comment: Glucose reference range applies only to samples taken after fasting for at least 8 hours.  Urinalysis, w/ Reflex to Culture (Infection  Suspected) -Urine, Clean Catch     Status: Abnormal   Collection Time: 11/19/22 12:40 PM  Result Value Ref Range   Specimen Source URINE, CLEAN CATCH    Color, Urine YELLOW YELLOW   APPearance HAZY (A) CLEAR   Specific Gravity, Urine 1.011 1.005 - 1.030   pH 8.0 5.0 - 8.0   Glucose, UA NEGATIVE NEGATIVE mg/dL   Hgb urine dipstick SMALL (A) NEGATIVE   Bilirubin Urine NEGATIVE NEGATIVE   Ketones, ur NEGATIVE NEGATIVE mg/dL   Protein, ur NEGATIVE NEGATIVE mg/dL   Nitrite POSITIVE (A) NEGATIVE   Leukocytes,Ua MODERATE (A) NEGATIVE   RBC / HPF 0-5 0 - 5 RBC/hpf   WBC, UA >50 0 - 5 WBC/hpf    Comment:        Reflex urine culture not performed if WBC <=10, OR if Squamous epithelial cells >5. If Squamous epithelial cells >5 suggest recollection.    Bacteria, UA MANY (A) NONE SEEN   Squamous Epithelial / HPF 0-5 0 - 5 /HPF   WBC Clumps PRESENT    Mucus PRESENT     Comment: Performed at [facility], [address] [place], [place] [address]  Rapid urine drug screen (hospital performed)     Status: None   Collection Time: 11/19/22 12:40 PM  Result Value Ref Range   Opiates NONE DETECTED NONE DETECTED   Cocaine NONE DETECTED NONE DETECTED   Benzodiazepines NONE DETECTED NONE DETECTED   Amphetamines NONE DETECTED NONE DETECTED  Tetrahydrocannabinol NONE DETECTED NONE DETECTED   Barbiturates NONE DETECTED NONE DETECTED    Comment: (NOTE) DRUG SCREEN FOR MEDICAL PURPOSES ONLY.  IF CONFIRMATION IS NEEDED FOR ANY PURPOSE, NOTIFY LAB WITHIN 5 DAYS.  LOWEST DETECTABLE LIMITS FOR URINE DRUG SCREEN Drug Class                     Cutoff (ng/mL) Amphetamine and metabolites    1000 Barbiturate and metabolites    200 Benzodiazepine                 200 Opiates and metabolites        300 Cocaine and metabolites        300 THC                            50 Performed at [facility], [address] [place], [place] [address]   Lactic acid, plasma     Status: None    Collection Time: 11/19/22  2:40 PM  Result Value Ref Range   Lactic Acid, Venous 1.7 0.5 - 1.9 mmol/L    Comment: Performed at [facility], [address] [place], [place] [address]  Magnesium     Status: None   Collection Time: 11/19/22  2:40 PM  Result Value Ref Range   Magnesium 1.7 1.7 - 2.4 mg/dL    Comment: Performed at [facility], [address] [place], [place] [address]  Troponin I (High Sensitivity)     Status: Abnormal   Collection Time: 11/19/22  2:40 PM  Result Value Ref Range   Troponin I (High Sensitivity) 21 (H) <18 ng/L    Comment: (NOTE) Elevated high sensitivity troponin I (hsTnI) values and significant  changes across serial measurements may suggest ACS but many other  chronic and acute conditions are known to elevate hsTnI results.  Refer to the "Links" section for chest pain algorithms and additional  guidance. Performed at [facility], [address] [place], [place] [address]    CT ABDOMEN PELVIS W CONTRAST  Result Date: 11/19/2022 CLINICAL DATA:  Abdominal pain. EXAM: CT ABDOMEN AND PELVIS WITH CONTRAST TECHNIQUE: Multidetector CT imaging of the abdomen and pelvis was performed using the standard protocol following bolus administration of intravenous contrast. RADIATION DOSE REDUCTION: This exam was performed according to the departmental dose-optimization program which includes automated exposure control, adjustment of the mA and/or kV according to patient size and/or use of iterative reconstruction technique. CONTRAST:  75mL OMNIPAQUE IOHEXOL 350 MG/ML SOLN COMPARISON:  04/28/2022 and 03/26/2013 FINDINGS: Lower chest: Stable borderline cardiomegaly. Calcified plaque of the left anterior descending and right coronary arteries. Thoracic aorta is normal in caliber. Mild calcified plaque throughout the descending thoracic aorta. Possible small pericardial cyst versus loculated pleural fluid adjacent the inferior posterior right heart border  unchanged from 2014. Mild bibasilar posterior dependent atelectasis. Two small nodular opacities along the right minor fissure with the larger measuring 5 mm. Hepatobiliary: 1.3 cm hypodensity of the right lobe of the liver unchanged and compatible with a cyst. Subcentimeter hypodensity of the inferior right lobe of the liver significantly decrease in size from 2014 and unchanged from 2023. Biliary tree and gallbladder are normal for. Pancreas: Normal. Spleen: Normal. Adrenals/Urinary Tract: Adrenal glands are normal. Kidneys are normal in size. No hydronephrosis or nephrolithiasis. 1.5 cm simple cyst of the upper pole right kidney. No follow-up imaging is recommended. Ureters  and bladder are normal. Stomach/Bowel: Stomach and small bowel are normal. Appendix is normal. Diverticulosis of the sigmoid colon without active inflammation. Vascular/Lymphatic: Mild calcified plaque of the abdominal aorta which is normal in caliber. Remaining osseous structures unremarkable. No adenopathy. Reproductive: Uterus and bilateral adnexa are unremarkable. Other: No significant free fluid or focal inflammatory change. Musculoskeletal: Degenerative change of the spine and hips. No focal abnormality. IMPRESSION: 1. No acute findings in the abdomen/pelvis. 2. Diverticulosis of the sigmoid colon without active inflammation. 3. Two small liver hypodensities unchanged from 2014 and likely benign cysts. 4. 1.5 cm right renal cyst.  No follow-up imaging is recommended for 5. Aortic atherosclerosis. Atherosclerotic coronary artery disease. 6. Two small nodular opacities along the right minor fissure with the larger measuring 5 mm. No follow-up needed if patient is low-risk (and has no known or suspected primary neoplasm). Non-contrast chest CT can be considered in 12 months if patient is high-risk. This recommendation follows the consensus statement: Guidelines for Management of Incidental Pulmonary Nodules Detected on CT Images: From the  Fleischner Society 2017; Radiology 2017; 284:228-243. Aortic Atherosclerosis (ICD10-I70.0). Electronically Signed   By: Elberta Fortis M.D.   On: 11/19/2022 14:27   DG Pelvis Portable  Result Date: 11/19/2022 CLINICAL DATA:  Fall EXAM: PORTABLE PELVIS 1-2 VIEWS COMPARISON:  None Available. FINDINGS: Bones appear demineralized. There is no evidence of pelvic fracture or diastasis. No evidence of hip fracture or dislocation. No pelvic bone lesions are seen. Atherosclerotic vascular calcifications. IMPRESSION: No acute osseous abnormality of the pelvis. Electronically Signed   By: Duanne Guess D.O.   On: 11/19/2022 12:45   DG Chest Portable 1 View  Result Date: 11/19/2022 CLINICAL DATA:  Pain after fall EXAM: PORTABLE CHEST 1 VIEW COMPARISON:  08/17/2022 x-ray and older FINDINGS: Overlapping cardiac leads. Osteopenia. No consolidation, pneumothorax or effusion. No edema. Normal cardiopericardial silhouette. Calcified aorta. Mild degenerative changes along the spine. IMPRESSION: No acute cardiopulmonary disease. Electronically Signed   By: Karen Kays M.D.   On: 11/19/2022 12:42   CT Head Wo Contrast  Result Date: 11/19/2022 CLINICAL DATA:  Head trauma.  Mental status change. EXAM: CT HEAD WITHOUT CONTRAST CT CERVICAL SPINE WITHOUT CONTRAST TECHNIQUE: Multidetector CT imaging of the head and cervical spine was performed following the standard protocol without intravenous contrast. Multiplanar CT image reconstructions of the cervical spine were also generated. RADIATION DOSE REDUCTION: This exam was performed according to the departmental dose-optimization program which includes automated exposure control, adjustment of the mA and/or kV according to patient size and/or use of iterative reconstruction technique. COMPARISON:  MRI 08/18/2022 FINDINGS: CT HEAD FINDINGS Brain: No evidence of acute infarction, hemorrhage, hydrocephalus, extra-axial collection or mass lesion/mass effect. Stable appearance of  calcified meningioma overlying the right frontal lobe measuring 0.7 cm, image 17/5. There is mild diffuse low-attenuation within the subcortical and periventricular white matter compatible with chronic microvascular disease. Unchanged appearance of remote left parietal lobe infarct, image 17/3. Vascular: No hyperdense vessel or unexpected calcification. Skull: Normal. Negative for fracture or focal lesion. Sinuses/Orbits: Chronic mucosal thickening involving the maxillary sinuses, left greater than right. No sinus air-fluid levels. Mastoid air cells are clear. Other: None CT CERVICAL SPINE FINDINGS Alignment: No posttraumatic malalignment. First degree anterolisthesis of C4 on C5 is likely related to chronic spondylosis. Skull base and vertebrae: No acute fracture. No primary bone lesion or focal pathologic process. Soft tissues and spinal canal: No prevertebral fluid or swelling. No visible canal hematoma. Disc levels: Multilevel disc space narrowing and  endplate spurring noted. This is most severe at C5-6 and C6-7. Bilateral facet arthropathy noted. Upper chest: Negative. Other: None IMPRESSION: 1. No acute intracranial abnormality. 2. Chronic small vessel ischemic disease and remote left parietal lobe infarct. 3. Stable appearance of calcified meningioma overlying the right frontal lobe. 4. No evidence for cervical spine fracture or subluxation. 5. Cervical degenerative disc disease and facet arthropathy. Electronically Signed   By: Signa Kell M.D.   On: 11/19/2022 12:07   CT Cervical Spine Wo Contrast  Result Date: 11/19/2022 CLINICAL DATA:  Head trauma.  Mental status change. EXAM: CT HEAD WITHOUT CONTRAST CT CERVICAL SPINE WITHOUT CONTRAST TECHNIQUE: Multidetector CT imaging of the head and cervical spine was performed following the standard protocol without intravenous contrast. Multiplanar CT image reconstructions of the cervical spine were also generated. RADIATION DOSE REDUCTION: This exam was  performed according to the departmental dose-optimization program which includes automated exposure control, adjustment of the mA and/or kV according to patient size and/or use of iterative reconstruction technique. COMPARISON:  MRI 08/18/2022 FINDINGS: CT HEAD FINDINGS Brain: No evidence of acute infarction, hemorrhage, hydrocephalus, extra-axial collection or mass lesion/mass effect. Stable appearance of calcified meningioma overlying the right frontal lobe measuring 0.7 cm, image 17/5. There is mild diffuse low-attenuation within the subcortical and periventricular white matter compatible with chronic microvascular disease. Unchanged appearance of remote left parietal lobe infarct, image 17/3. Vascular: No hyperdense vessel or unexpected calcification. Skull: Normal. Negative for fracture or focal lesion. Sinuses/Orbits: Chronic mucosal thickening involving the maxillary sinuses, left greater than right. No sinus air-fluid levels. Mastoid air cells are clear. Other: None CT CERVICAL SPINE FINDINGS Alignment: No posttraumatic malalignment. First degree anterolisthesis of C4 on C5 is likely related to chronic spondylosis. Skull base and vertebrae: No acute fracture. No primary bone lesion or focal pathologic process. Soft tissues and spinal canal: No prevertebral fluid or swelling. No visible canal hematoma. Disc levels: Multilevel disc space narrowing and endplate spurring noted. This is most severe at C5-6 and C6-7. Bilateral facet arthropathy noted. Upper chest: Negative. Other: None IMPRESSION: 1. No acute intracranial abnormality. 2. Chronic small vessel ischemic disease and remote left parietal lobe infarct. 3. Stable appearance of calcified meningioma overlying the right frontal lobe. 4. No evidence for cervical spine fracture or subluxation. 5. Cervical degenerative disc disease and facet arthropathy. Electronically Signed   By: Signa Kell M.D.   On: 11/19/2022 12:07    Pending Labs Unresulted Labs  (From admission, onward)     Start     Ordered   11/20/22 0500  Comprehensive metabolic panel  Tomorrow morning,   R        11/19/22 1556   11/20/22 0500  CBC  Tomorrow morning,   R        11/19/22 1556   11/19/22 1240  Urine Culture  Once,   R        11/19/22 1240   11/19/22 1218  Ammonia  Add-on,   AD        11/19/22 1217   11/19/22 1126  Ethanol  Once,   URGENT        11/19/22 1126            Vitals/Pain Today's Vitals   11/19/22 1549 11/19/22 1715 11/19/22 1730 11/19/22 1754  BP:  (!) 171/78  (!) 158/79  Pulse:   70 73  Resp:   18 18  Temp: 98 F (36.7 C)     TempSrc: Oral  SpO2:   98% 98%  Weight:      Height:      PainSc:        Isolation Precautions No active isolations  Medications Medications  apixaban (ELIQUIS) tablet 5 mg (has no administration in time range)  metoprolol succinate (TOPROL-XL) 24 hr tablet 100 mg (has no administration in time range)  irbesartan (AVAPRO) tablet 150 mg (has no administration in time range)  sodium chloride flush (NS) 0.9 % injection 3 mL (has no administration in time range)  potassium chloride 10 mEq in 100 mL IVPB (10 mEq Intravenous New Bag/Given 11/19/22 1711)  0.9 %  sodium chloride infusion ( Intravenous New Bag/Given 11/19/22 1615)  acetaminophen (TYLENOL) tablet 650 mg (has no administration in time range)    Or  acetaminophen (TYLENOL) suppository 650 mg (has no administration in time range)  polyethylene glycol (MIRALAX / GLYCOLAX) packet 17 g (has no administration in time range)  cefTRIAXone (ROCEPHIN) 1 g in sodium chloride 0.9 % 100 mL IVPB (has no administration in time range)  iohexol (OMNIPAQUE) 350 MG/ML injection 75 mL (75 mLs Intravenous Contrast Given 11/19/22 1355)  cefTRIAXone (ROCEPHIN) 1 g in sodium chloride 0.9 % 100 mL IVPB (0 g Intravenous Stopped 11/19/22 1542)  sodium chloride 0.9 % bolus 1,000 mL (0 mLs Intravenous Stopped 11/19/22 1529)    Mobility Baseline mobility unknown. Patient is  not oriented, no family at bedside.      Focused Assessments Neuro Assessment Handoff:  Swallow screen pass? Yes          Neuro Assessment: Exceptions to [facility] Neuro Checks:      Has TPA been given? No If patient is a Neuro Trauma and patient is going to OR before floor call report to 4N Charge nurse: [phone] or [phone]   R Recommendations: See Admitting Provider Note  Report given to:   Additional Notes: patient is alert, disoriented x 4.

## 2022-11-19 NOTE — ED Notes (Signed)
Patient denies neck pain at this time. C-collar removed per Dr. Alinda Money instructions.

## 2022-11-20 DIAGNOSIS — I10 Essential (primary) hypertension: Secondary | ICD-10-CM | POA: Diagnosis not present

## 2022-11-20 DIAGNOSIS — I5032 Chronic diastolic (congestive) heart failure: Secondary | ICD-10-CM | POA: Diagnosis not present

## 2022-11-20 DIAGNOSIS — G9340 Encephalopathy, unspecified: Secondary | ICD-10-CM | POA: Diagnosis not present

## 2022-11-20 DIAGNOSIS — G9341 Metabolic encephalopathy: Secondary | ICD-10-CM

## 2022-11-20 LAB — COMPREHENSIVE METABOLIC PANEL
ALT: 13 U/L (ref 0–44)
AST: 17 U/L (ref 15–41)
Albumin: 3.1 g/dL — ABNORMAL LOW (ref 3.5–5.0)
Alkaline Phosphatase: 53 U/L (ref 38–126)
Anion gap: 9 (ref 5–15)
BUN: 6 mg/dL — ABNORMAL LOW (ref 8–23)
CO2: 25 mmol/L (ref 22–32)
Calcium: 10.1 mg/dL (ref 8.9–10.3)
Chloride: 103 mmol/L (ref 98–111)
Creatinine, Ser: 0.61 mg/dL (ref 0.44–1.00)
GFR, Estimated: >60 mL/min (ref >60)
Glucose, Bld: 98 mg/dL (ref 70–99)
Potassium: 3.5 mmol/L (ref 3.5–5.1)
Sodium: 137 mmol/L (ref 135–145)
Total Bilirubin: 0.8 mg/dL (ref 0.3–1.2)
Total Protein: 6.1 g/dL — ABNORMAL LOW (ref 6.5–8.1)

## 2022-11-20 LAB — AMMONIA: Ammonia: 13 umol/L (ref 9–35)

## 2022-11-20 LAB — CBC
HCT: 32.1 % — ABNORMAL LOW (ref 36.0–46.0)
Hemoglobin: 10.4 g/dL — ABNORMAL LOW (ref 12.0–15.0)
MCH: 31.6 pg (ref 26.0–34.0)
MCHC: 32.4 g/dL (ref 30.0–36.0)
MCV: 97.6 fL (ref 80.0–100.0)
Platelets: 194 10*3/uL (ref 150–400)
RBC: 3.29 MIL/uL — ABNORMAL LOW (ref 3.87–5.11)
RDW: 13.4 % (ref 11.5–15.5)
WBC: 7.2 10*3/uL (ref 4.0–10.5)
nRBC: 0 % (ref 0.0–0.2)

## 2022-11-20 LAB — ETHANOL: Alcohol, Ethyl (B): <10 mg/dL (ref <10)

## 2022-11-20 LAB — URINE CULTURE

## 2022-11-20 MED ORDER — HYDRALAZINE HCL 25 MG PO TABS
25.0000 mg | ORAL_TABLET | Freq: Four times a day (QID) | ORAL | Status: DC | PRN
  Administered 2022-11-20 – 2022-11-22 (×3): 25 mg via ORAL
  Filled 2022-11-20 (×3): qty 1

## 2022-11-20 NOTE — Progress Notes (Signed)
Mobility Specialist Progress Note   11/20/22 1622  Mobility  Activity Ambulated with assistance in room  Level of Assistance Contact guard assist, steadying assist  Assistive Device Front wheel walker  Distance Ambulated (ft) 24 ft  Activity Response Tolerated well  Mobility Referral Yes  $Mobility charge 1 Mobility  Mobility Specialist Start Time (ACUTE ONLY) 1601  Mobility Specialist Stop Time (ACUTE ONLY) 1621  Mobility Specialist Time Calculation (min) (ACUTE ONLY) 20 min   Received pt in bed c/o fatigue and deferring hallway ambulation but w/ max encouragement pt agreeable to in room ambulation. Mild dizziness while in room but tolerating mobility well. Returned back bed w/ call bell in reach and bed alarm on.    Frederico Hamman Mobility Specialist Please contact via SecureChat or  Rehab office at [phone]

## 2022-11-20 NOTE — TOC Initial Note (Signed)
Transition of Care [facility]) - Initial/Assessment Note    Patient Details  Name: Jeanette [name] MRN: [id number] Date of Birth: [birth year]  Transition of Care [facility]) CM/SW Contact:    Kermit Balo, RN Phone Number: 11/20/2022, 3:31 PM  Clinical Narrative:                 Patient is from home with her spouse. Pt states he still works part time.  Spouse provides needed transportation.  Pt states she manages her medications at home.  Pt is active with [facility] for HHPT. CM has updated Kandee Keen with [facility] on her admission. TOC following for further d/c needs.   Expected Discharge Plan: Home w Home Health Services Barriers to Discharge: Continued Medical Work up   Patient Goals and CMS Choice   CMS Medicare.gov Compare Post Acute Care list provided to:: Patient Choice offered to / list presented to : Patient      Expected Discharge Plan and Services   Discharge Planning Services: CM Consult Post Acute Care Choice: Home Health Living arrangements for the past 2 months: Single Family Home                           HH Arranged: PT HH Agency: [facility] Date [facility] Agency Contacted: 11/20/22   Representative spoke with at [facility] Agency: Kandee Keen  Prior Living Arrangements/Services Living arrangements for the past 2 months: Single Family Home Lives with:: Spouse Patient language and need for interpreter reviewed:: Yes Do you feel safe going back to the place where you live?: Yes        Care giver support system in place?: Yes (comment) Current home services: DME (walker/ shower seat/ cane) Criminal Activity/Legal Involvement Pertinent to Current Situation/Hospitalization: No - Comment as needed  Activities of Daily Living   ADL Screening (condition at time of admission) Patient's cognitive ability adequate to safely complete daily activities?: No  Permission Sought/Granted                  Emotional Assessment Appearance:: Appears stated  age Attitude/Demeanor/Rapport: Engaged Affect (typically observed): Accepting Orientation: : Oriented to Self, Oriented to Place   Psych Involvement: No (comment)  Admission diagnosis:  Acute encephalopathy [G93.40] Patient Active Problem List   Diagnosis Date Noted   History of breast cancer in female 11/19/2022   Fall at home, initial encounter 11/19/2022   UTI (urinary tract infection) 11/19/2022   Acute encephalopathy 11/19/2022   Sinus pause 08/23/2022   Bowel and bladder incontinence 05/22/2022   Primary hyperparathyroidism (HCC) 05/17/2021   Chronic diastolic CHF (congestive heart failure) (HCC) 12/04/2019   Vertebral artery stenosis with cerebral infarction (HCC) 12/04/2019   History of CVA (cerebrovascular accident) 12/04/2019   Chest pressure 03/06/2019   Paroxysmal atrial fibrillation (HCC) 03/15/2018   Aphasia    Mixed hyperlipidemia    Bad headache    Renal artery stenosis (HCC) 10/21/2013   Essential hypertension 10/21/2013   Chronic cystitis 07/09/2011   PALPITATIONS, RECURRENT 06/22/2009   STRESS ELECTROCARDIOGRAM, ABNORMAL 06/22/2009   SHORTNESS OF BREATH 05/25/2009   ANEMIA 03/21/2009   DEGENERATIVE DISC DISEASE 03/21/2009   CHEST PAIN-UNSPECIFIED 03/21/2009   PCP:  Rodrigo Ran, MD Pharmacy:   [facility] [phone] - [place], [place] - [address] [place] [address] Phone: [phone] Fax: [phone]     Social Determinants of Health (SDOH) Social History: SDOH  Screenings   Food Insecurity: Patient Unable To Answer (11/19/2022)  Housing: Patient Unable To Answer (11/20/2022)  Utilities: Patient Unable To Answer (11/19/2022)  Tobacco Use: Low Risk  (11/19/2022)   SDOH Interventions:     Readmission Risk Interventions     No data to display

## 2022-11-20 NOTE — Evaluation (Addendum)
Occupational Therapy Evaluation Patient Details Name: Jeanette [name] MRN: [id number] DOB: Dec 20, 1934 Today's Date: 11/20/2022   History of Present Illness Pt is a 88 y.o. F who presents 11/19/2022 after a fall at home with unknown downtime and AMS. Pt with history of frequent UTIs and similar disorientation. MRI negative for acute abnormality. Urinalysis positive for nitrates, leukocytes, bacteria. Significant PMH: renal artery stenosis, vertebral artery stenosis, stroke, HTN, breast CA, atrial fibrillation, diastolic CHF, anemia.   Clinical Impression   PTA, pt lives w/ husband, reports typically Modified Independent w/ ADLs and mobility using Rollator while family assists with IADLs. Pt does endorse at least 2 falls due to dizziness at home. Pt presents now with deficits in cognition, strength and endurance. Overall, pt able to mobilize in room using RW w/ min guard though unable to stand for ADLs long due to onset of dizziness (resolved w/ seated rest break). Pt requires Min A for ADLs at this time. Anticipate pt will be able to return home at DC w/ consistent family support.      Recommendations for follow up therapy are one component of a multi-disciplinary discharge planning process, led by the attending physician.  Recommendations may be updated based on patient status, additional functional criteria and insurance authorization.   Assistance Recommended at Discharge Frequent or constant Supervision/Assistance  Patient can return home with the following A little help with walking and/or transfers;A little help with bathing/dressing/bathroom;Assistance with cooking/housework;Direct supervision/assist for medications management;Direct supervision/assist for financial management;Assist for transportation    Functional Status Assessment  Patient has had a recent decline in their functional status and demonstrates the ability to make significant improvements in function in a reasonable and  predictable amount of time.  Equipment Recommendations  BSC/3in1 (if does not already have)    Recommendations for Other Services       Precautions / Restrictions Precautions Precautions: Fall;Other (comment) Precaution Comments: monitor dizziness Restrictions Weight Bearing Restrictions: No      Mobility Bed Mobility Overal bed mobility: Needs Assistance Bed Mobility: Supine to Sit, Sit to Supine     Supine to sit: Supervision Sit to supine: Supervision   General bed mobility comments: No physical assist required, HOB flat, increased time    Transfers Overall transfer level: Needs assistance Equipment used: Rolling walker (2 wheels) Transfers: Sit to/from Stand Sit to Stand: Min guard                  Balance Overall balance assessment: Needs assistance Sitting-balance support: Feet supported Sitting balance-Leahy Scale: Good     Standing balance support: Bilateral upper extremity supported Standing balance-Leahy Scale: Fair                             ADL either performed or assessed with clinical judgement   ADL Overall ADL's : Needs assistance/impaired Eating/Feeding: Set up;Sitting   Grooming: Min guard;Set up;Sitting;Standing;Oral care;Brushing hair Grooming Details (indicate cue type and reason): brushing hair standing at sink w/ min guard. though pt reporting dizziness, requested need to sit down so completed remainder of grooming tasks seated at sink Upper Body Bathing: Minimal assistance   Lower Body Bathing: Minimal assistance;Sit to/from stand   Upper Body Dressing : Minimal assistance;Sitting   Lower Body Dressing: Moderate assistance;Sitting/lateral leans;Sit to/from stand   Toilet Transfer: Min guard;Ambulation;Rolling walker (2 wheels)   Toileting- Clothing Manipulation and Hygiene: Minimal assistance;Sit to/from stand;Sitting/lateral lean       Functional mobility during  ADLs: Min guard;Rolling walker (2  wheels) General ADL Comments: limited by dizziness w/ activity though pt able to indicate when seated rest break needed as well.     Vision Ability to See in Adequate Light: 1 Impaired Patient Visual Report: Other (comment) (to be further assessed) Vision Assessment?: Vision impaired- to be further tested in functional context Additional Comments: per PT, some questionable peripheral vision impairments. Pt able to functionally complete tasks during OT session, no overshooting or undershooting noted and no difficulty w/ obstacles. to be further assessed as pt fatigue also limited full vision assessment     Perception     Praxis      Pertinent Vitals/Pain Pain Assessment Pain Assessment: No/denies pain     Hand Dominance Right   Extremity/Trunk Assessment Upper Extremity Assessment Upper Extremity Assessment: Generalized weakness   Lower Extremity Assessment Lower Extremity Assessment: Defer to PT evaluation   Cervical / Trunk Assessment Cervical / Trunk Assessment: Kyphotic   Communication Communication Communication: No difficulties   Cognition Arousal/Alertness: Awake/alert Behavior During Therapy: WFL for tasks assessed/performed Overall Cognitive Status: History of cognitive impairments - at baseline                                 General Comments: Pt A&Ox3, STM deficits noted, cannot recall events leading up to hospitalization, pt reporting that she has dizziness which leads to falls.     General Comments  Sister entering at end of session    Exercises     Shoulder Instructions      Home Living Family/patient expects to be discharged to:: Private residence Living Arrangements: Spouse/significant other Available Help at Discharge: Family Type of Home: House Home Access: Level entry     Home Layout: One level     Bathroom Shower/Tub: Producer, television/film/video: Standard Bathroom Accessibility: Yes   Home Equipment: Rollator (4  wheels);Shower seat - built in   Additional Comments: per sister, husband usually around but may step out for a work visit (can mostly do at home) or out for community tasks. Son and sister can help fill in when needed to assist pt      Prior Functioning/Environment Prior Level of Function : Patient poor historian/Family not available             Mobility Comments: ambulatory with rollator ADLs Comments: pt reports MOD I for ADLs w/ sister confirming. pt does sink baths mostly. Assist with IADLs at home        OT Problem List: Decreased strength;Decreased activity tolerance;Impaired balance (sitting and/or standing);Decreased safety awareness      OT Treatment/Interventions: Self-care/ADL training;Therapeutic exercise;Energy conservation;DME and/or AE instruction;Therapeutic activities;Balance training;Patient/family education    OT Goals(Current goals can be found in the care plan section) Acute Rehab OT Goals Patient Stated Goal: home soon, have some lunch OT Goal Formulation: With patient Time For Goal Achievement: 12/04/22 Potential to Achieve Goals: Good ADL Goals Pt Will Perform Lower Body Bathing: sit to/from stand;sitting/lateral leans;with set-up Pt Will Perform Lower Body Dressing: with set-up;sit to/from stand;sitting/lateral leans Pt Will Transfer to Toilet: with set-up;ambulating Additional ADL Goal #1: Pt to verbalize at least 3 fall prevention strategies to implement in home environment  OT Frequency: Min 2X/week    Co-evaluation              AM-PAC OT "6 Clicks" Daily Activity     Outcome Measure Help from another person  eating meals?: None Help from another person taking care of personal grooming?: A Little Help from another person toileting, which includes using toliet, bedpan, or urinal?: A Little Help from another person bathing (including washing, rinsing, drying)?: A Little Help from another person to put on and taking off regular upper body  clothing?: A Little Help from another person to put on and taking off regular lower body clothing?: A Little 6 Click Score: 19   End of Session Equipment Utilized During Treatment: Rolling walker (2 wheels) Nurse Communication: Mobility status;Other (comment) (dizziness)  Activity Tolerance: Patient tolerated treatment well Patient left: in bed;with call bell/phone within reach;with bed alarm set;with family/visitor present  OT Visit Diagnosis: Unsteadiness on feet (R26.81);Other abnormalities of gait and mobility (R26.89);Muscle weakness (generalized) (M62.81)                Time: 1030-1102 OT Time Calculation (min): 32 min Charges:  OT General Charges $OT Visit: 1 Visit OT Evaluation $OT Eval Low Complexity: 1 Low OT Treatments $Self Care/Home Management : 8-22 mins  Bradd Canary, OTR/L Acute Rehab Services Office: [phone]   Lorre Munroe 11/20/2022, 11:49 AM

## 2022-11-20 NOTE — Progress Notes (Signed)
PROGRESS NOTE  Jeanette [name] ZOX:[id number] DOB: May 04, 1935   PCP: Rodrigo Ran, MD  Patient is from: Home.  Lives with husband.  Uses rolling walker at baseline.  DOA: 11/19/2022 LOS: 0  Chief complaints Chief Complaint  Patient presents with   Fall     Brief Narrative / Interim history: 88 year old F with PMH of CVA, HTN, breast cancer, A-fib on Eliquis, diastolic CHF, anemia and recent UTI brought to ED after found down on the floor by family.  Reportedly has been disoriented for several days prior to fall.  On admission, patient was "more alert and able to answer some questions but had some word finding difficulty".  She had no leukocytosis.  UDS negative.  CT head, cervical spine, abdomen and pelvis without acute finding.  UA concerning for UTI.  She has no leukocytosis but lactic acid to 2.6.  Cultures obtained.  Patient was started on IV ceftriaxone.  MRI brain ordered.  The next day, patient is awake, alert and oriented x 4.  Urine culture with GNR.  MRI brain without acute finding.  She admitted to dysuria and recurrent UTI.   Subjective: Seen and examined earlier this morning.  No major events overnight of this morning.  Sitting on bedside chair.  She is awake, alert and oriented x 4.  Has no recollection into why she was brought to the hospital.  Denies pain, shortness of breath, cough, nausea or vomiting.  She admits to dysuria and recurrent UTI.  Objective: Vitals:   11/19/22 2305 11/20/22 0405 11/20/22 0744 11/20/22 1139  BP: (!) 144/97 (!) 164/86 (!) 172/73 (!) 172/69  Pulse: 89 73 75 73  Resp: 17 17 18 18   Temp: (!) 97.2 F (36.2 C) 98.4 F (36.9 C) 98.2 F (36.8 C) 98.2 F (36.8 C)  TempSrc: Oral Oral    SpO2: 98% 100% 98% 98%  Weight: 53 kg     Height: 5\' 3"  (1.6 m)       Examination:  GENERAL: No apparent distress.  Nontoxic. HEENT: MMM.  Vision and hearing grossly intact.  NECK: Supple.  No apparent JVD.  RESP:  No IWOB.  Fair aeration  bilaterally. CVS:  RRR. Heart sounds normal.  ABD/GI/GU: BS+. Abd soft, NTND.  MSK/EXT:  Moves extremities. No apparent deformity. No edema.  SKIN: no apparent skin lesion or wound NEURO: Awake, alert and oriented x 4.  No apparent focal neuro deficit. PSYCH: Calm. Normal affect.   Procedures:  None  Microbiology summarized: Urine culture with GNR.  Assessment and plan: Active Problems:   Chronic diastolic CHF (congestive heart failure) (HCC)   ANEMIA   Renal artery stenosis (HCC)   Essential hypertension   Paroxysmal atrial fibrillation (HCC)   Mixed hyperlipidemia   History of CVA (cerebrovascular accident)   History of breast cancer in female   Fall at home, initial encounter   UTI (urinary tract infection)   Acute encephalopathy  Acute metabolic encephalopathy likely due to UTI: Encephalopathy resolved.  She is alert and oriented x 4.  No focal neuro deficit on exam.  CT head and MRI brain without acute finding.  UDS negative. -Continue IV ceftriaxone pending culture speciation and sensitivity -Reorientation and delirium precautions. -PT/OT  Unwitnessed fall at home: Likely due to the above. -Fall precaution -PT/OT  Urinary tract infection: Patient reports dysuria.  Since she was treated with Macrobid on 4/25 -Continue IV ceftriaxone pending culture speciation and sensitivity   Hypertension: BP slightly elevated. -Continue home metoprolol -Avapro for  home Benicar. -Add p.o. hydralazine as needed   Atrial fibrillation: Rate controlled. -Continue metoprolol and Eliquis   History of CVA/HLD: MRI brain without acute finding. - Continue home Repatha - On Eliquis as above   History of vertebral artery stenosis History of renal artery stenosis -On Repatha and Eliquis as above   Chronic diastolic CHF: TTE in 2023 with LVEF of 55 to 60% and G1DD.  Euvolemic.  Not on diuretics. -Monitor respiratory and fluid status.  History of breast cancer - Noted  Body mass  index is 20.7 kg/m.          DVT prophylaxis:   apixaban (ELIQUIS) tablet 5 mg  Code Status: Full code Family Communication: None at bedside Level of care: Telemetry Medical Status is: Observation The patient will require care spanning > 2 midnights and should be moved to inpatient because: Encephalopathy and urinary tract infection   Final disposition: Home Consultants:  None  55 minutes with more than 50% spent in reviewing records, counseling patient/family and coordinating care.   Sch Meds:  Scheduled Meds:  apixaban  5 mg Oral BID   irbesartan  150 mg Oral Daily   metoprolol  100 mg Oral BID   sodium chloride flush  3 mL Intravenous Q12H   Continuous Infusions:  cefTRIAXone (ROCEPHIN)  IV     PRN Meds:.acetaminophen **OR** acetaminophen, polyethylene glycol  Antimicrobials: Anti-infectives (From admission, onward)    Start     Dose/Rate Route Frequency Ordered Stop   11/20/22 1200  cefTRIAXone (ROCEPHIN) 1 g in sodium chloride 0.9 % 100 mL IVPB        1 g 200 mL/hr over 30 Minutes Intravenous Every 24 hours 11/19/22 1559     11/20/22 0000  cefTRIAXone (ROCEPHIN) 1 g in sodium chloride 0.9 % 100 mL IVPB  Status:  Discontinued        1 g 200 mL/hr over 30 Minutes Intravenous Every 24 hours 11/19/22 1556 11/19/22 1559   11/19/22 1400  cefTRIAXone (ROCEPHIN) 1 g in sodium chloride 0.9 % 100 mL IVPB        1 g 200 mL/hr over 30 Minutes Intravenous  Once 11/19/22 1358 11/19/22 1542        I have personally reviewed the following labs and images: CBC: Recent Labs  Lab 11/19/22 1140 11/19/22 1146 11/20/22 0054  WBC 8.1  --  7.2  HGB 11.2* 12.2 10.4*  HCT 36.1 36.0 32.1*  MCV 100.0  --  97.6  PLT 226  --  194   BMP &GFR Recent Labs  Lab 11/19/22 1140 11/19/22 1146 11/19/22 1440 11/20/22 0054  NA 136 138  --  137  K 3.1* 3.2*  --  3.5  CL 99 100  --  103  CO2 27  --   --  25  GLUCOSE 143* 142*  --  98  BUN 8 9  --  6*  CREATININE 0.70 0.50   --  0.61  CALCIUM 10.5*  --   --  10.1  MG  --   --  1.7  --    Estimated Creatinine Clearance: 41.8 mL/min (by C-G formula based on SCr of 0.61 mg/dL). Liver & Pancreas: Recent Labs  Lab 11/19/22 1140 11/20/22 0054  AST 20 17  ALT 11 13  ALKPHOS 58 53  BILITOT 0.9 0.8  PROT 7.0 6.1*  ALBUMIN 3.7 3.1*   No results for input(s): "LIPASE", "AMYLASE" in the last 168 hours. Recent Labs  Lab 11/20/22 [phone]  AMMONIA 13   Diabetic: No results for input(s): "HGBA1C" in the last 72 hours. Recent Labs  Lab 11/19/22 1207  GLUCAP 132*   Cardiac Enzymes: Recent Labs  Lab 11/19/22 1140  CKTOTAL 60   No results for input(s): "PROBNP" in the last 8760 hours. Coagulation Profile: No results for input(s): "INR", "PROTIME" in the last 168 hours. Thyroid Function Tests: No results for input(s): "TSH", "T4TOTAL", "FREET4", "T3FREE", "THYROIDAB" in the last 72 hours. Lipid Profile: No results for input(s): "CHOL", "HDL", "LDLCALC", "TRIG", "CHOLHDL", "LDLDIRECT" in the last 72 hours. Anemia Panel: No results for input(s): "VITAMINB12", "FOLATE", "FERRITIN", "TIBC", "IRON", "RETICCTPCT" in the last 72 hours. Urine analysis:    Component Value Date/Time   COLORURINE YELLOW 11/19/2022 1240   APPEARANCEUR HAZY (A) 11/19/2022 1240   LABSPEC 1.011 11/19/2022 1240   PHURINE 8.0 11/19/2022 1240   GLUCOSEU NEGATIVE 11/19/2022 1240   HGBUR SMALL (A) 11/19/2022 1240   BILIRUBINUR NEGATIVE 11/19/2022 1240   KETONESUR NEGATIVE 11/19/2022 1240   PROTEINUR NEGATIVE 11/19/2022 1240   UROBILINOGEN 0.2 03/14/2009 1400   NITRITE POSITIVE (A) 11/19/2022 1240   LEUKOCYTESUR MODERATE (A) 11/19/2022 1240   Sepsis Labs: Invalid input(s): "PROCALCITONIN", "LACTICIDVEN"  Microbiology: Recent Results (from the past 240 hour(s))  Urine Culture     Status: Abnormal (Preliminary result)   Collection Time: 11/19/22 12:40 PM   Specimen: Urine, Random  Result Value Ref Range Status   Specimen  Description URINE, RANDOM  Final   Special Requests NONE Reflexed from [id number]  Final   Culture (A)  Final    >=100,000 COLONIES/mL GRAM NEGATIVE RODS SUSCEPTIBILITIES TO FOLLOW Performed at [facility], [address] [place], [place] [address]    Report Status PENDING  Incomplete    Radiology Studies: MR BRAIN WO CONTRAST  Result Date: 11/19/2022 CLINICAL DATA:  Neuro deficit, acute, stroke suspected. Previous strokes. Patient was found down. Patient has been disoriented for several days. EXAM: MRI HEAD WITHOUT CONTRAST TECHNIQUE: Multiplanar, multiecho pulse sequences of the brain and surrounding structures were obtained without intravenous contrast. COMPARISON:  CT head without contrast 11/19/2022 MR head without contrast 08/18/2022 FINDINGS: Brain: No acute infarct, hemorrhage, or mass lesion is present. The ventricles are of normal size. No significant extraaxial fluid collection is present. Moderate atrophy and white matter changes are noted. A remote left parietal infarct is present. Remote infarcts of the cerebellum bilaterally are stable. The brainstem is unremarkable. The internal auditory canals are within normal limits. Midline structures are within normal limits. Vascular: Flow is present in the major intracranial arteries. Skull and upper cervical spine: The craniocervical junction is normal. Upper cervical spine is within normal limits. Marrow signal is unremarkable. Sinuses/Orbits: The paranasal sinuses and mastoid air cells are clear. Bilateral lens replacements are noted. Globes and orbits are otherwise unremarkable. IMPRESSION: 1. No acute intracranial abnormality or significant interval change. 2. Moderate atrophy and white matter disease likely reflects the sequela of chronic microvascular ischemia. 3. Remote left parietal infarct. 4. Remote infarcts of the cerebellum bilaterally. Electronically Signed   By: Marin Roberts M.D.   On: 11/19/2022 19:28   CT ABDOMEN  PELVIS W CONTRAST  Result Date: 11/19/2022 CLINICAL DATA:  Abdominal pain. EXAM: CT ABDOMEN AND PELVIS WITH CONTRAST TECHNIQUE: Multidetector CT imaging of the abdomen and pelvis was performed using the standard protocol following bolus administration of intravenous contrast. RADIATION DOSE REDUCTION: This exam was performed according to the departmental dose-optimization program which includes automated exposure control, adjustment of the  mA and/or kV according to patient size and/or use of iterative reconstruction technique. CONTRAST:  75mL OMNIPAQUE IOHEXOL 350 MG/ML SOLN COMPARISON:  04/28/2022 and 03/26/2013 FINDINGS: Lower chest: Stable borderline cardiomegaly. Calcified plaque of the left anterior descending and right coronary arteries. Thoracic aorta is normal in caliber. Mild calcified plaque throughout the descending thoracic aorta. Possible small pericardial cyst versus loculated pleural fluid adjacent the inferior posterior right heart border unchanged from 2014. Mild bibasilar posterior dependent atelectasis. Two small nodular opacities along the right minor fissure with the larger measuring 5 mm. Hepatobiliary: 1.3 cm hypodensity of the right lobe of the liver unchanged and compatible with a cyst. Subcentimeter hypodensity of the inferior right lobe of the liver significantly decrease in size from 2014 and unchanged from 2023. Biliary tree and gallbladder are normal for. Pancreas: Normal. Spleen: Normal. Adrenals/Urinary Tract: Adrenal glands are normal. Kidneys are normal in size. No hydronephrosis or nephrolithiasis. 1.5 cm simple cyst of the upper pole right kidney. No follow-up imaging is recommended. Ureters and bladder are normal. Stomach/Bowel: Stomach and small bowel are normal. Appendix is normal. Diverticulosis of the sigmoid colon without active inflammation. Vascular/Lymphatic: Mild calcified plaque of the abdominal aorta which is normal in caliber. Remaining osseous structures  unremarkable. No adenopathy. Reproductive: Uterus and bilateral adnexa are unremarkable. Other: No significant free fluid or focal inflammatory change. Musculoskeletal: Degenerative change of the spine and hips. No focal abnormality. IMPRESSION: 1. No acute findings in the abdomen/pelvis. 2. Diverticulosis of the sigmoid colon without active inflammation. 3. Two small liver hypodensities unchanged from 2014 and likely benign cysts. 4. 1.5 cm right renal cyst.  No follow-up imaging is recommended for 5. Aortic atherosclerosis. Atherosclerotic coronary artery disease. 6. Two small nodular opacities along the right minor fissure with the larger measuring 5 mm. No follow-up needed if patient is low-risk (and has no known or suspected primary neoplasm). Non-contrast chest CT can be considered in 12 months if patient is high-risk. This recommendation follows the consensus statement: Guidelines for Management of Incidental Pulmonary Nodules Detected on CT Images: From the Fleischner Society 2017; Radiology 2017; 284:228-243. Aortic Atherosclerosis (ICD10-I70.0). Electronically Signed   By: Elberta Fortis M.D.   On: 11/19/2022 14:27   DG Pelvis Portable  Result Date: 11/19/2022 CLINICAL DATA:  Fall EXAM: PORTABLE PELVIS 1-2 VIEWS COMPARISON:  None Available. FINDINGS: Bones appear demineralized. There is no evidence of pelvic fracture or diastasis. No evidence of hip fracture or dislocation. No pelvic bone lesions are seen. Atherosclerotic vascular calcifications. IMPRESSION: No acute osseous abnormality of the pelvis. Electronically Signed   By: Duanne Guess D.O.   On: 11/19/2022 12:45   DG Chest Portable 1 View  Result Date: 11/19/2022 CLINICAL DATA:  Pain after fall EXAM: PORTABLE CHEST 1 VIEW COMPARISON:  08/17/2022 x-ray and older FINDINGS: Overlapping cardiac leads. Osteopenia. No consolidation, pneumothorax or effusion. No edema. Normal cardiopericardial silhouette. Calcified aorta. Mild degenerative  changes along the spine. IMPRESSION: No acute cardiopulmonary disease. Electronically Signed   By: Karen Kays M.D.   On: 11/19/2022 12:42      Praveen Coia T. Javan Gonzaga [facility]  If 7PM-7AM, please contact night-coverage [email or web address] 11/20/2022, 12:01 PM

## 2022-11-20 NOTE — Care Management Obs Status (Signed)
MEDICARE OBSERVATION STATUS NOTIFICATION   Patient Details  Name: Jeanette [name] MRN: [id number] Date of Birth: 09/20/1934   Medicare Observation Status Notification Given:  Yes    Kermit Balo, RN 11/20/2022, 3:34 PM

## 2022-11-20 NOTE — Evaluation (Signed)
Physical Therapy Evaluation Patient Details Name: Jeanette [name] MRN: [id number] DOB: May 04, 1935 Today's Date: 11/20/2022  History of Present Illness  Pt is a 88 y.o. F who presents 11/19/2022 after a fall at home with unknown downtime and AMS. Pt with history of frequent UTIs and similar disorientation. MRI negative for acute abnormality. Urinalysis positive for nitrates, leukocytes, bacteria. Significant PMH: renal artery stenosis, vertebral artery stenosis, stroke, HTN, breast CA, atrial fibrillation, diastolic CHF, anemia.  Clinical Impression  Pt admitted with above. Pt A&Ox3, although displays STM deficits and decreased safety awareness. Pt requiring min assist for transfers and ambulating limited room distances with a walker at a min guard assist level. Further distance deferred as pt reporting dizziness; BP actually on the more elevated side, 175/81, HR 68-71. No nystagmus noted with smooth pursuits, however, pt with tendency initially to squint R eye with visual field testing. Suspect steady progress. Pt reporting spouse in [place] with son currently (unsure of accuracy); would recommend pt have initial 24/7 assist for ADL's and mobility. Would benefit from follow up HHPT in setting of fall to address balance and strengthening.     Recommendations for follow up therapy are one component of a multi-disciplinary discharge planning process, led by the attending physician.  Recommendations may be updated based on patient status, additional functional criteria and insurance authorization.  Follow Up Recommendations       Assistance Recommended at Discharge Frequent or constant Supervision/Assistance  Patient can return home with the following  A little help with walking and/or transfers;A little help with bathing/dressing/bathroom;Assistance with cooking/housework;Direct supervision/assist for medications management;Direct supervision/assist for financial management;Assist for transportation;Help  with stairs or ramp for entrance    Equipment Recommendations None recommended by PT  Recommendations for Other Services       Functional Status Assessment Patient has had a recent decline in their functional status and demonstrates the ability to make significant improvements in function in a reasonable and predictable amount of time.     Precautions / Restrictions Precautions Precautions: Fall Restrictions Weight Bearing Restrictions: No      Mobility  Bed Mobility Overal bed mobility: Needs Assistance Bed Mobility: Supine to Sit     Supine to sit: Supervision     General bed mobility comments: No physical assist required, HOB flat, increased time    Transfers Overall transfer level: Needs assistance Equipment used: Rolling walker (2 wheels) Transfers: Sit to/from Stand Sit to Stand: Min assist           General transfer comment: Light minA to power up from edge of bed x 2    Ambulation/Gait Ambulation/Gait assistance: Min guard Gait Distance (Feet): 5 Feet Assistive device: Rolling walker (2 wheels) Gait Pattern/deviations: Step-through pattern, Decreased stride length Gait velocity: decreased     General Gait Details: Min guard for safety, cues for sequencing/technique  Stairs            Wheelchair Mobility    Modified Rankin (Stroke Patients Only)       Balance Overall balance assessment: Needs assistance Sitting-balance support: Feet supported Sitting balance-Leahy Scale: Good     Standing balance support: Bilateral upper extremity supported Standing balance-Leahy Scale: Fair                               Pertinent Vitals/Pain Pain Assessment Pain Assessment: No/denies pain    Home Living Family/patient expects to be discharged to:: Private residence Living Arrangements: Spouse/significant other  Available Help at Discharge: Family;Available 24 hours/day Type of Home: House Home Access: Level entry       Home  Layout: One level Home Equipment: Rollator (4 wheels)      Prior Function Prior Level of Function : Needs assist             Mobility Comments: ambulatory with rollator ADLs Comments: assistance with all ADLs/IADLs     Hand Dominance   Dominant Hand: Right    Extremity/Trunk Assessment   Upper Extremity Assessment Upper Extremity Assessment: Defer to OT evaluation    Lower Extremity Assessment Lower Extremity Assessment: Generalized weakness       Communication   Communication: No difficulties  Cognition Arousal/Alertness: Awake/alert Behavior During Therapy: WFL for tasks assessed/performed Overall Cognitive Status: History of cognitive impairments - at baseline                                 General Comments: Pt A&Ox3, STM deficits noted, cannot recall events leading up to hospitalization, pt reporting that she has dizziness which leads to falls.        General Comments      Exercises     Assessment/Plan    PT Assessment Patient needs continued PT services  PT Problem List Decreased strength;Decreased activity tolerance;Decreased balance;Decreased mobility;Decreased cognition;Decreased safety awareness       PT Treatment Interventions DME instruction;Gait training;Functional mobility training;Therapeutic activities;Therapeutic exercise;Balance training;Patient/family education    PT Goals (Current goals can be found in the Care Plan section)  Acute Rehab PT Goals Patient Stated Goal: did not state PT Goal Formulation: With patient Time For Goal Achievement: 12/04/22 Potential to Achieve Goals: Good    Frequency Min 3X/week     Co-evaluation               AM-PAC PT "6 Clicks" Mobility  Outcome Measure Help needed turning from your back to your side while in a flat bed without using bedrails?: None Help needed moving from lying on your back to sitting on the side of a flat bed without using bedrails?: A Little Help needed  moving to and from a bed to a chair (including a wheelchair)?: A Little Help needed standing up from a chair using your arms (e.g., wheelchair or bedside chair)?: A Little Help needed to walk in hospital room?: A Little Help needed climbing 3-5 steps with a railing? : A Lot 6 Click Score: 18    End of Session Equipment Utilized During Treatment: Gait belt Activity Tolerance: Patient tolerated treatment well Patient left: in chair;with call bell/phone within reach;with chair alarm set Nurse Communication: Mobility status;Other (comment) (BP, IV site bleeding) PT Visit Diagnosis: Unsteadiness on feet (R26.81);History of falling (Z91.81)    Time: 8119-1478 PT Time Calculation (min) (ACUTE ONLY): 26 min   Charges:   PT Evaluation $PT Eval Low Complexity: 1 Low PT Treatments $Therapeutic Activity: 8-22 mins        Lillia Pauls, PT, DPT Acute Rehabilitation Services Office [phone]   Norval Morton 11/20/2022, 8:41 AM

## 2022-11-21 DIAGNOSIS — I701 Atherosclerosis of renal artery: Secondary | ICD-10-CM | POA: Diagnosis not present

## 2022-11-21 DIAGNOSIS — I10 Essential (primary) hypertension: Secondary | ICD-10-CM | POA: Diagnosis not present

## 2022-11-21 DIAGNOSIS — G9340 Encephalopathy, unspecified: Secondary | ICD-10-CM | POA: Diagnosis not present

## 2022-11-21 DIAGNOSIS — I480 Paroxysmal atrial fibrillation: Secondary | ICD-10-CM | POA: Diagnosis not present

## 2022-11-21 LAB — CBC
HCT: 34.1 % — ABNORMAL LOW (ref 36.0–46.0)
Hemoglobin: 10.9 g/dL — ABNORMAL LOW (ref 12.0–15.0)
MCH: 30.2 pg (ref 26.0–34.0)
MCHC: 32 g/dL (ref 30.0–36.0)
MCV: 94.5 fL (ref 80.0–100.0)
Platelets: 209 10*3/uL (ref 150–400)
RBC: 3.61 MIL/uL — ABNORMAL LOW (ref 3.87–5.11)
RDW: 13.5 % (ref 11.5–15.5)
WBC: 4.6 10*3/uL (ref 4.0–10.5)
nRBC: 0 % (ref 0.0–0.2)

## 2022-11-21 LAB — RENAL FUNCTION PANEL
Albumin: 3 g/dL — ABNORMAL LOW (ref 3.5–5.0)
Anion gap: 8 (ref 5–15)
BUN: 6 mg/dL — ABNORMAL LOW (ref 8–23)
CO2: 24 mmol/L (ref 22–32)
Calcium: 10.4 mg/dL — ABNORMAL HIGH (ref 8.9–10.3)
Chloride: 104 mmol/L (ref 98–111)
Creatinine, Ser: 0.64 mg/dL (ref 0.44–1.00)
GFR, Estimated: >60 mL/min
Glucose, Bld: 91 mg/dL (ref 70–99)
Phosphorus: 2.3 mg/dL — ABNORMAL LOW (ref 2.5–4.6)
Potassium: 3.1 mmol/L — ABNORMAL LOW (ref 3.5–5.1)
Sodium: 136 mmol/L (ref 135–145)

## 2022-11-21 LAB — URINE CULTURE: Culture: >=100000 — AB

## 2022-11-21 LAB — MAGNESIUM: Magnesium: 1.6 mg/dL — ABNORMAL LOW (ref 1.7–2.4)

## 2022-11-21 MED ORDER — POTASSIUM CHLORIDE CRYS ER 20 MEQ PO TBCR
40.0000 meq | EXTENDED_RELEASE_TABLET | Freq: Two times a day (BID) | ORAL | Status: DC
  Administered 2022-11-21: 40 meq via ORAL
  Filled 2022-11-21: qty 2

## 2022-11-21 MED ORDER — POTASSIUM & SODIUM PHOSPHATES 280-160-250 MG PO PACK
1.0000 | PACK | Freq: Three times a day (TID) | ORAL | Status: DC
  Administered 2022-11-21: 1 via ORAL
  Filled 2022-11-21 (×6): qty 1

## 2022-11-21 MED ORDER — MAGNESIUM SULFATE 2 GM/50ML IV SOLN
2.0000 g | Freq: Once | INTRAVENOUS | Status: AC
  Administered 2022-11-21: 2 g via INTRAVENOUS
  Filled 2022-11-21: qty 50

## 2022-11-21 NOTE — Progress Notes (Signed)
Physical Therapy Treatment Patient Details Name: Jeanette [name] MRN: [id number] DOB: [birth year] Today's Date: 11/21/2022   History of Present Illness Pt is a 88 y.o. F who presents 11/19/2022 after a fall at home with unknown downtime and AMS. Pt with history of frequent UTIs and similar disorientation. MRI negative for acute abnormality. Urinalysis positive for nitrates, leukocytes, bacteria. Significant PMH: renal artery stenosis, vertebral artery stenosis, stroke, HTN, breast CA, atrial fibrillation, diastolic CHF, anemia.    PT Comments    Pt progressing slowly towards her physical therapy goals. Continues to report dizziness with transitional movements; positive for mild orthostasis (see below). Could not obtain 3 min standing time due to urinary urgency. Pt ambulating 15 ft with a walker, with light assist for balance/stability. Recommendations remain appropriate for HHPT.  Orthostatic Vital Signs: Supine: 116/52 (72), HR 79 Sitting: 104/81 (90), HR 85 Standing: 94/67 (75), HR 109 Sitting on BSC: 113/57 (75), HR 91    Recommendations for follow up therapy are one component of a multi-disciplinary discharge planning process, led by the attending physician.  Recommendations may be updated based on patient status, additional functional criteria and insurance authorization.  Follow Up Recommendations       Assistance Recommended at Discharge Frequent or constant Supervision/Assistance  Patient can return home with the following A little help with walking and/or transfers;A little help with bathing/dressing/bathroom;Assistance with cooking/housework;Direct supervision/assist for medications management;Direct supervision/assist for financial management;Assist for transportation;Help with stairs or ramp for entrance   Equipment Recommendations  None recommended by PT    Recommendations for Other Services       Precautions / Restrictions Precautions Precautions: Fall;Other  (comment) Precaution Comments: monitor dizziness Restrictions Weight Bearing Restrictions: No     Mobility  Bed Mobility Overal bed mobility: Needs Assistance Bed Mobility: Supine to Sit     Supine to sit: Supervision          Transfers Overall transfer level: Needs assistance Equipment used: Rolling walker (2 wheels), None Transfers: Sit to/from Stand, Bed to chair/wheelchair/BSC Sit to Stand: Min guard Stand pivot transfers: Min guard         General transfer comment: Min guard for safety, increased time    Ambulation/Gait Ambulation/Gait assistance: Min assist, Min guard Gait Distance (Feet): 15 Feet Assistive device: Rolling walker (2 wheels) Gait Pattern/deviations: Step-through pattern, Decreased stride length, Trunk flexed Gait velocity: decreased Gait velocity interpretation: <1.8 ft/sec, indicate of risk for recurrent falls   General Gait Details: Min guard-minA for balance, encouragement to progress, cues for environmental navigation   Stairs             Wheelchair Mobility    Modified Rankin (Stroke Patients Only)       Balance Overall balance assessment: Needs assistance Sitting-balance support: Feet supported Sitting balance-Leahy Scale: Good     Standing balance support: Bilateral upper extremity supported Standing balance-Leahy Scale: Fair                              Cognition Arousal/Alertness: Awake/alert Behavior During Therapy: WFL for tasks assessed/performed Overall Cognitive Status: History of cognitive impairments - at baseline                                 General Comments: Pt A&Ox3, STM deficits noted, cannot recall events leading up to hospitalization, pt reporting that she has dizziness which leads to falls.  Exercises      General Comments        Pertinent Vitals/Pain Pain Assessment Pain Assessment: No/denies pain    Home Living                           Prior Function            PT Goals (current goals can now be found in the care plan section) Acute Rehab PT Goals Potential to Achieve Goals: Good Progress towards PT goals: Progressing toward goals    Frequency    Min 3X/week      PT Plan Current plan remains appropriate    Co-evaluation              AM-PAC PT "6 Clicks" Mobility   Outcome Measure  Help needed turning from your back to your side while in a flat bed without using bedrails?: None Help needed moving from lying on your back to sitting on the side of a flat bed without using bedrails?: A Little Help needed moving to and from a bed to a chair (including a wheelchair)?: A Little Help needed standing up from a chair using your arms (e.g., wheelchair or bedside chair)?: A Little Help needed to walk in hospital room?: A Little Help needed climbing 3-5 steps with a railing? : A Lot 6 Click Score: 18    End of Session Equipment Utilized During Treatment: Gait belt Activity Tolerance: Patient tolerated treatment well Patient left: in chair;with call bell/phone within reach;with chair alarm set Nurse Communication: Mobility status;Other (comment) (vitals) PT Visit Diagnosis: Unsteadiness on feet (R26.81);History of falling (Z91.81)     Time: 1610-9604 PT Time Calculation (min) (ACUTE ONLY): 19 min  Charges:  $Therapeutic Activity: 8-22 mins                     Lillia Pauls, PT, DPT Acute Rehabilitation Services Office [phone]    Norval Morton 11/21/2022, 10:20 AM

## 2022-11-21 NOTE — Progress Notes (Signed)
[facility]                                                                               Noon Sipp, is a 88 y.o. female, DOB - 04-14-1935, ZOX:[id number] Admit date - 11/19/2022    Outpatient Primary MD for the patient is Rodrigo Ran, MD  LOS - 0  days    Brief summary    88 year old F with PMH of CVA, HTN, breast cancer, A-fib on Eliquis, diastolic CHF, anemia and recent UTI brought to ED after found down on the floor by family.  Reportedly has been disoriented for several days prior to fall.  On admission, patient was "more alert and able to answer some questions but had some word finding difficulty".  She had no leukocytosis.  UDS negative.  CT head, cervical spine, abdomen and pelvis without acute finding.  UA concerning for UTI.  She has no leukocytosis but lactic acid to 2.6.  Cultures obtained.  Patient was started on IV ceftriaxone.  MRI brain ordered.   The next day, patient is awake, alert and oriented x 4.  Urine culture with GNR.  MRI brain without acute finding.  She admitted to dysuria and recurrent UTI.    Assessment & Plan    Assessment and Plan:  Acute metabolic encephalopathy secondary to urinary tract infection CT and MRI of the brain without acute finding. Currently on IV Rocephin pending cultures. Therapy evaluations ordered.   Unwitnessed fall at home probably secondary to urinary tract infection and dehydration.    Hypertension Blood pressure parameters appear to have normalized continue with home medications.    Paroxysmal atrial fibrillation Rate control with metoprolol and on Eliquis for anticoagulation.    History of CVA Continue with Repatha and on Eliquis for secondary stroke prevention.    History of vertebral artery stenosis and renal artery stenosis     Chronic diastolic heart failure She appears to be compensated at this time.   History of breast cancer  recommend outpatient follow-up as  needed.    Estimated body mass index is 20.7 kg/m as calculated from the following:   Height as of this encounter: 5\' 3"  (1.6 m).   Weight as of this encounter: 53 kg.  Code Status: Full code DVT Prophylaxis:   apixaban (ELIQUIS) tablet 5 mg   Level of Care: Level of care: Telemetry Medical Family Communication: None at the side  Disposition Plan:     Remains inpatient appropriate: Rocephin for urinary tract infection  Procedures:  CT of the head cervical bone abdomen and pelvis  Consultants:     Antimicrobials:   Anti-infectives (From admission, onward)    Start     Dose/Rate Route Frequency Ordered Stop   11/20/22 1200  cefTRIAXone (ROCEPHIN) 1 g in sodium chloride 0.9 % 100 mL IVPB        1 g 200 mL/hr over 30 Minutes Intravenous Every 24 hours 11/19/22 1559     11/20/22 0000  cefTRIAXone (ROCEPHIN) 1 g in sodium chloride 0.9 % 100 mL IVPB  Status:  Discontinued        1 g 200 mL/hr over 30 Minutes Intravenous Every 24 hours  11/19/22 1556 11/19/22 1559   11/19/22 1400  cefTRIAXone (ROCEPHIN) 1 g in sodium chloride 0.9 % 100 mL IVPB        1 g 200 mL/hr over 30 Minutes Intravenous  Once 11/19/22 1358 11/19/22 1542        Medications  Scheduled Meds:  apixaban  5 mg Oral BID   irbesartan  150 mg Oral Daily   metoprolol  100 mg Oral BID   sodium chloride flush  3 mL Intravenous Q12H   Continuous Infusions:  cefTRIAXone (ROCEPHIN)  IV 1 g (11/21/22 1206)   PRN Meds:.acetaminophen **OR** acetaminophen, hydrALAZINE, polyethylene glycol    Subjective:   Quinnly Carls was seen and examined today.  No new events overnight  Objective:   Vitals:   11/21/22 0005 11/21/22 0444 11/21/22 0736 11/21/22 1118  BP: (!) 155/69 (!) 185/95 (!) 123/58 136/72  Pulse: 82 82 81 83  Resp: 16 16 16 16   Temp: 98.4 F (36.9 C) 98.2 F (36.8 C) 98.7 F (37.1 C) 98 F (36.7 C)  TempSrc: Axillary Axillary Oral Oral  SpO2: 97% 99% 96% 100%  Weight:      Height:         Intake/Output Summary (Last 24 hours) at 11/21/2022 1411 Last data filed at 11/21/2022 1119 Gross per 24 hour  Intake --  Output 350 ml  Net -350 ml   Filed Weights   11/19/22 1118 11/19/22 2305  Weight: 59 kg 53 kg     Exam General: Alert and oriented x 3, NAD Cardiovascular: S1 S2 auscultated, no murmurs, RRR Respiratory: Clear to auscultation bilaterally, no wheezing, rales or rhonchi Gastrointestinal: Soft, nontender, nondistended, + bowel sounds Ext: no pedal edema bilaterally Neuro: Alert and answering all questions appropriately Skin: No rashes Psych: Calm and comfortable   Data Reviewed:  I have personally reviewed following labs and imaging studies   CBC Lab Results  Component Value Date   WBC 4.6 11/21/2022   RBC 3.61 (L) 11/21/2022   HGB 10.9 (L) 11/21/2022   HCT 34.1 (L) 11/21/2022   MCV 94.5 11/21/2022   MCH 30.2 11/21/2022   PLT 209 11/21/2022   MCHC 32.0 11/21/2022   RDW 13.5 11/21/2022   LYMPHSABS 1.2 10/26/2022   MONOABS 1.0 10/26/2022   EOSABS 0.0 10/26/2022   BASOSABS 0.0 10/26/2022     Last metabolic panel Lab Results  Component Value Date   NA 136 11/21/2022   K 3.1 (L) 11/21/2022   CL 104 11/21/2022   CO2 24 11/21/2022   BUN 6 (L) 11/21/2022   CREATININE 0.64 11/21/2022   GLUCOSE 91 11/21/2022   GFRNONAA >60 11/21/2022   GFRAA >60 12/04/2019   CALCIUM 10.4 (H) 11/21/2022   PHOS 2.3 (L) 11/21/2022   PROT 6.1 (L) 11/20/2022   ALBUMIN 3.0 (L) 11/21/2022   BILITOT 0.8 11/20/2022   ALKPHOS 53 11/20/2022   AST 17 11/20/2022   ALT 13 11/20/2022   ANIONGAP 8 11/21/2022    CBG (last 3)  Recent Labs    11/19/22 1207  GLUCAP 132*      Coagulation Profile: No results for input(s): "INR", "PROTIME" in the last 168 hours.   Radiology Studies: MR BRAIN WO CONTRAST  Result Date: 11/19/2022 CLINICAL DATA:  Neuro deficit, acute, stroke suspected. Previous strokes. Patient was found down. Patient has been disoriented for  several days. EXAM: MRI HEAD WITHOUT CONTRAST TECHNIQUE: Multiplanar, multiecho pulse sequences of the brain and surrounding structures were obtained without intravenous contrast. COMPARISON:  CT head without contrast 11/19/2022 MR head without contrast 08/18/2022 FINDINGS: Brain: No acute infarct, hemorrhage, or mass lesion is present. The ventricles are of normal size. No significant extraaxial fluid collection is present. Moderate atrophy and white matter changes are noted. A remote left parietal infarct is present. Remote infarcts of the cerebellum bilaterally are stable. The brainstem is unremarkable. The internal auditory canals are within normal limits. Midline structures are within normal limits. Vascular: Flow is present in the major intracranial arteries. Skull and upper cervical spine: The craniocervical junction is normal. Upper cervical spine is within normal limits. Marrow signal is unremarkable. Sinuses/Orbits: The paranasal sinuses and mastoid air cells are clear. Bilateral lens replacements are noted. Globes and orbits are otherwise unremarkable. IMPRESSION: 1. No acute intracranial abnormality or significant interval change. 2. Moderate atrophy and white matter disease likely reflects the sequela of chronic microvascular ischemia. 3. Remote left parietal infarct. 4. Remote infarcts of the cerebellum bilaterally. Electronically Signed   By: Marin Roberts M.D.   On: 11/19/2022 19:28       Kathlen Mody M.D. [facility] 11/21/2022, 2:11 PM  Available via Epic secure chat 7am-7pm After 7 pm, please refer to night coverage provider listed on amion.

## 2022-11-22 ENCOUNTER — Ambulatory Visit: Payer: Medicare Other | Admitting: Cardiology

## 2022-11-22 DIAGNOSIS — Z8249 Family history of ischemic heart disease and other diseases of the circulatory system: Secondary | ICD-10-CM | POA: Diagnosis not present

## 2022-11-22 DIAGNOSIS — I701 Atherosclerosis of renal artery: Secondary | ICD-10-CM | POA: Diagnosis present

## 2022-11-22 DIAGNOSIS — Z888 Allergy status to other drugs, medicaments and biological substances status: Secondary | ICD-10-CM | POA: Diagnosis not present

## 2022-11-22 DIAGNOSIS — R4182 Altered mental status, unspecified: Secondary | ICD-10-CM | POA: Diagnosis present

## 2022-11-22 DIAGNOSIS — F0390 Unspecified dementia without behavioral disturbance: Secondary | ICD-10-CM | POA: Diagnosis present

## 2022-11-22 DIAGNOSIS — N390 Urinary tract infection, site not specified: Secondary | ICD-10-CM | POA: Diagnosis present

## 2022-11-22 DIAGNOSIS — W19XXXA Unspecified fall, initial encounter: Secondary | ICD-10-CM | POA: Diagnosis present

## 2022-11-22 DIAGNOSIS — Z85048 Personal history of other malignant neoplasm of rectum, rectosigmoid junction, and anus: Secondary | ICD-10-CM | POA: Diagnosis not present

## 2022-11-22 DIAGNOSIS — Z820 Family history of epilepsy and other diseases of the nervous system: Secondary | ICD-10-CM | POA: Diagnosis not present

## 2022-11-22 DIAGNOSIS — Z7901 Long term (current) use of anticoagulants: Secondary | ICD-10-CM | POA: Diagnosis not present

## 2022-11-22 DIAGNOSIS — I480 Paroxysmal atrial fibrillation: Secondary | ICD-10-CM | POA: Diagnosis present

## 2022-11-22 DIAGNOSIS — I110 Hypertensive heart disease with heart failure: Secondary | ICD-10-CM | POA: Diagnosis present

## 2022-11-22 DIAGNOSIS — D649 Anemia, unspecified: Secondary | ICD-10-CM | POA: Diagnosis present

## 2022-11-22 DIAGNOSIS — I739 Peripheral vascular disease, unspecified: Secondary | ICD-10-CM | POA: Diagnosis present

## 2022-11-22 DIAGNOSIS — Z79899 Other long term (current) drug therapy: Secondary | ICD-10-CM | POA: Diagnosis not present

## 2022-11-22 DIAGNOSIS — I10 Essential (primary) hypertension: Secondary | ICD-10-CM | POA: Diagnosis not present

## 2022-11-22 DIAGNOSIS — Z8673 Personal history of transient ischemic attack (TIA), and cerebral infarction without residual deficits: Secondary | ICD-10-CM | POA: Diagnosis not present

## 2022-11-22 DIAGNOSIS — Y92009 Unspecified place in unspecified non-institutional (private) residence as the place of occurrence of the external cause: Secondary | ICD-10-CM | POA: Diagnosis not present

## 2022-11-22 DIAGNOSIS — E860 Dehydration: Secondary | ICD-10-CM | POA: Diagnosis present

## 2022-11-22 DIAGNOSIS — E782 Mixed hyperlipidemia: Secondary | ICD-10-CM | POA: Diagnosis present

## 2022-11-22 DIAGNOSIS — N3001 Acute cystitis with hematuria: Secondary | ICD-10-CM | POA: Diagnosis not present

## 2022-11-22 DIAGNOSIS — I5032 Chronic diastolic (congestive) heart failure: Secondary | ICD-10-CM | POA: Diagnosis present

## 2022-11-22 DIAGNOSIS — E039 Hypothyroidism, unspecified: Secondary | ICD-10-CM | POA: Diagnosis present

## 2022-11-22 DIAGNOSIS — Z853 Personal history of malignant neoplasm of breast: Secondary | ICD-10-CM | POA: Diagnosis not present

## 2022-11-22 DIAGNOSIS — G9341 Metabolic encephalopathy: Secondary | ICD-10-CM | POA: Diagnosis present

## 2022-11-22 LAB — BASIC METABOLIC PANEL
Anion gap: 9 (ref 5–15)
BUN: 6 mg/dL — ABNORMAL LOW (ref 8–23)
CO2: 27 mmol/L (ref 22–32)
Calcium: 10.5 mg/dL — ABNORMAL HIGH (ref 8.9–10.3)
Chloride: 102 mmol/L (ref 98–111)
Creatinine, Ser: 0.62 mg/dL (ref 0.44–1.00)
GFR, Estimated: >60 mL/min (ref >60)
Glucose, Bld: 88 mg/dL (ref 70–99)
Potassium: 3.5 mmol/L (ref 3.5–5.1)
Sodium: 138 mmol/L (ref 135–145)

## 2022-11-22 LAB — MAGNESIUM: Magnesium: 2.2 mg/dL (ref 1.7–2.4)

## 2022-11-22 MED ORDER — OLMESARTAN MEDOXOMIL 20 MG PO TABS: 20.0000 mg | ORAL_TABLET | Freq: Every day | ORAL | 1 refills | Status: DC

## 2022-11-22 MED ORDER — CEPHALEXIN 500 MG PO CAPS: 500.0000 mg | ORAL_CAPSULE | Freq: Two times a day (BID) | ORAL | 0 refills | Status: AC

## 2022-11-22 MED ORDER — SODIUM CHLORIDE 0.9 % IV SOLN
1.0000 g | Freq: Once | INTRAVENOUS | Status: AC
  Administered 2022-11-22: 1 g via INTRAVENOUS
  Filled 2022-11-22: qty 10

## 2022-11-22 MED ORDER — POLYETHYLENE GLYCOL 3350 17 G PO PACK: 17.0000 g | PACK | Freq: Every day | ORAL | 0 refills | Status: DC | PRN

## 2022-11-22 MED ORDER — CEPHALEXIN 500 MG PO CAPS
500.0000 mg | ORAL_CAPSULE | Freq: Two times a day (BID) | ORAL | Status: DC
  Filled 2022-11-22: qty 1

## 2022-11-22 NOTE — Plan of Care (Signed)
Pt alert and oriented x 2. Vitals stable. BM 5/15. No prns given  Problem: Education: Goal: Knowledge of General Education information will improve Description: Including pain rating scale, medication(s)/side effects and non-pharmacologic comfort measures Outcome: Progressing   Problem: Health Behavior/Discharge Planning: Goal: Ability to manage health-related needs will improve Outcome: Progressing   Problem: Clinical Measurements: Goal: Ability to maintain clinical measurements within normal limits will improve Outcome: Progressing Goal: Will remain free from infection Outcome: Progressing Goal: Diagnostic test results will improve Outcome: Progressing Goal: Respiratory complications will improve Outcome: Progressing Goal: Cardiovascular complication will be avoided Outcome: Progressing   Problem: Activity: Goal: Risk for activity intolerance will decrease Outcome: Progressing   Problem: Nutrition: Goal: Adequate nutrition will be maintained Outcome: Progressing   Problem: Coping: Goal: Level of anxiety will decrease Outcome: Progressing   Problem: Elimination: Goal: Will not experience complications related to bowel motility Outcome: Progressing Goal: Will not experience complications related to urinary retention Outcome: Progressing   Problem: Pain Managment: Goal: General experience of comfort will improve Outcome: Progressing   Problem: Safety: Goal: Ability to remain free from injury will improve Outcome: Progressing   Problem: Skin Integrity: Goal: Risk for impaired skin integrity will decrease Outcome: Progressing

## 2022-11-22 NOTE — Progress Notes (Signed)
Occupational Therapy Treatment Patient Details Name: Jeanette [name] MRN: [id number] DOB: 02-22-1935 Today's Date: 11/22/2022   History of present illness Pt is a 88 y.o. F who presents 11/19/2022 after a fall at home with unknown downtime and AMS. Pt with history of frequent UTIs and similar disorientation. MRI negative for acute abnormality. Urinalysis positive for nitrates, leukocytes, bacteria. Significant PMH: renal artery stenosis, vertebral artery stenosis, stroke, HTN, breast CA, atrial fibrillation, diastolic CHF, anemia.   OT comments  Pt noted with increased confusion, flat affect and reporting a headache today. Pt received naked In bed and pt unaware that bed was wet. With increased time and limited physical assistance, pt able to assist with bed mobility at Min A, clean up and donning fresh clothing with Min A and pivot to chair with min guard using RW. Per pt, husband back in town and able to assist at DC. Would recommend initial 24/7 supervision/assist due to pt continued AMS.   Recommendations for follow up therapy are one component of a multi-disciplinary discharge planning process, led by the attending physician.  Recommendations may be updated based on patient status, additional functional criteria and insurance authorization.    Assistance Recommended at Discharge Frequent or constant Supervision/Assistance  Patient can return home with the following  A little help with walking and/or transfers;A little help with bathing/dressing/bathroom;Assistance with cooking/housework;Direct supervision/assist for medications management;Direct supervision/assist for financial management;Assist for transportation   Equipment Recommendations  BSC/3in1 (if does not already have)    Recommendations for Other Services      Precautions / Restrictions Precautions Precautions: Fall;Other (comment) Precaution Comments: monitor dizziness Restrictions Weight Bearing Restrictions: No        Mobility Bed Mobility Overal bed mobility: Needs Assistance Bed Mobility: Supine to Sit     Supine to sit: Min assist, HOB elevated     General bed mobility comments: handheld assist and multiple cues for sequencing and initiation    Transfers Overall transfer level: Needs assistance Equipment used: Rolling walker (2 wheels), None Transfers: Sit to/from Stand, Bed to chair/wheelchair/BSC Sit to Stand: Min guard     Step pivot transfers: Min guard           Balance Overall balance assessment: Needs assistance Sitting-balance support: No upper extremity supported, Feet supported Sitting balance-Leahy Scale: Fair     Standing balance support: Bilateral upper extremity supported Standing balance-Leahy Scale: Poor                             ADL either performed or assessed with clinical judgement   ADL Overall ADL's : Needs assistance/impaired                 Upper Body Dressing : Minimal assistance;Bed level                     General ADL Comments: Pt with increased confusion and flat affect though may be due to reported headache. Pt naked on entry w/ unawareness of bed being wet.    Extremity/Trunk Assessment Upper Extremity Assessment Upper Extremity Assessment: Generalized weakness   Lower Extremity Assessment Lower Extremity Assessment: Defer to PT evaluation        Vision   Vision Assessment?: No apparent visual deficits   Perception     Praxis      Cognition Arousal/Alertness: Awake/alert Behavior During Therapy: Flat affect Overall Cognitive Status: No family/caregiver present to determine baseline cognitive functioning  General Comments: A&Ox2 though extended time needed to recall being at [facility]. reports month as february then corrected to May, unable to report year (reports 58 then later 27). follows directions with repetition and increased time. decreased awareness as pt  unaware that bed was wet as no purewick in place. pt also naked on entry though does endorse removing gown because it was wet (then noted to attempt to remove clean dry gown after being assisted to place)        Exercises      Shoulder Instructions       General Comments      Pertinent Vitals/ Pain       Pain Assessment Pain Assessment: Faces Faces Pain Scale: Hurts even more Pain Location: headache Pain Descriptors / Indicators: Headache Pain Intervention(s): Monitored during session, Limited activity within patient's tolerance, Other (comment) (had already received tylenol)  Home Living                                          Prior Functioning/Environment              Frequency  Min 2X/week        Progress Toward Goals  OT Goals(current goals can now be found in the care plan section)  Progress towards OT goals: OT to reassess next treatment  Acute Rehab OT Goals Patient Stated Goal: home today, feel better OT Goal Formulation: With patient Time For Goal Achievement: 12/04/22 Potential to Achieve Goals: Good ADL Goals Pt Will Perform Lower Body Bathing: sit to/from stand;sitting/lateral leans;with set-up Pt Will Perform Lower Body Dressing: with set-up;sit to/from stand;sitting/lateral leans Pt Will Transfer to Toilet: with set-up;ambulating Additional ADL Goal #1: Pt to verbalize at least 3 fall prevention strategies to implement in home environment  Plan Discharge plan remains appropriate    Co-evaluation                 AM-PAC OT "6 Clicks" Daily Activity     Outcome Measure   Help from another person eating meals?: None Help from another person taking care of personal grooming?: A Little Help from another person toileting, which includes using toliet, bedpan, or urinal?: A Little Help from another person bathing (including washing, rinsing, drying)?: A Little Help from another person to put on and taking off regular upper  body clothing?: A Little Help from another person to put on and taking off regular lower body clothing?: A Little 6 Click Score: 19    End of Session Equipment Utilized During Treatment: Rolling walker (2 wheels)  OT Visit Diagnosis: Unsteadiness on feet (R26.81);Other abnormalities of gait and mobility (R26.89);Muscle weakness (generalized) (M62.81)   Activity Tolerance Patient limited by fatigue   Patient Left in chair;with call bell/phone within reach;with chair alarm set   Nurse Communication Mobility status        Time: 4098-1191 OT Time Calculation (min): 24 min  Charges: OT General Charges $OT Visit: 1 Visit OT Treatments $Self Care/Home Management : 8-22 mins $Therapeutic Activity: 8-22 mins  Bradd Canary, OTR/L Acute Rehab Services Office: [phone]   Lorre Munroe 11/22/2022, 12:24 PM

## 2022-11-22 NOTE — Discharge Summary (Signed)
Physician Discharge Summary   Patient: Jeanette [name] MRN: [id number] DOB: [birth year]  Admit date:     11/19/2022  Discharge date: 11/22/22  Discharge Physician: Kathlen Mody   PCP: Rodrigo Ran, MD   Recommendations at discharge:  Please follow up with PCP in one week.  Please check cbc and bmp in one week.    Discharge Diagnoses: Principal Problem:   UTI (urinary tract infection) Active Problems:   Chronic diastolic CHF (congestive heart failure) (HCC)   ANEMIA   Renal artery stenosis (HCC)   Essential hypertension   Paroxysmal atrial fibrillation (HCC)   Mixed hyperlipidemia   History of CVA (cerebrovascular accident)   History of breast cancer in female   Fall at home, initial encounter   Acute encephalopathy  Resolved Problems:   * No resolved hospital problems. *  Hospital Course: 88 year old F with PMH of CVA, HTN, breast cancer, A-fib on Eliquis, diastolic CHF, anemia and recent UTI brought to ED after found down on the floor by family.  Reportedly has been disoriented for several days prior to fall.  On admission, patient was "more alert and able to answer some questions but had some word finding difficulty".  She had no leukocytosis.  UDS negative.  CT head, cervical spine, abdomen and pelvis without acute finding.  UA concerning for UTI.  She has no leukocytosis but lactic acid to 2.6.  Cultures obtained.  Patient was started on IV ceftriaxone.  MRI brain ordered.   The next day, patient is awake, alert and oriented x 4.  Urine culture with GNR.  MRI brain without acute finding.  She admitted to dysuria and recurrent UTI.  Assessment and Plan:  Acute metabolic encephalopathy secondary to urinary tract infection CT and MRI of the brain without acute finding. Completed 4 days of iv ROCEPHIN and discharged on oral keflex to complete the course.  Therapy evaluations ordered.     Unwitnessed fall at home probably secondary to urinary tract infection and  dehydration.       Hypertension Blood pressure parameters appear to have improved. Increased Benicar to 20 mg daily.        Paroxysmal atrial fibrillation Rate control with metoprolol and on Eliquis for anticoagulation.       History of CVA Continue with Repatha and on Eliquis for secondary stroke prevention.       History of vertebral artery stenosis and renal artery stenosis         Chronic diastolic heart failure She appears to be compensated at this time.     History of breast cancer  recommend outpatient follow-up as needed.       Estimated body mass index is 20.7 kg/m as calculated from the following:   Height as of this encounter: 5\' 3"  (1.6 m).   Weight as of this encounter: 53 kg.    Consultants: none.  Procedures performed: none.  Disposition: Home Diet recommendation:  Regular diet DISCHARGE MEDICATION: Allergies as of 11/22/2022       Reactions   Epinephrine Anaphylaxis   Shortness of breath and increased heart rate   Cozaar [losartan] Other (See Comments)   increased air hunger and dizziness.   Crestor [rosuvastatin] Other (See Comments)   memory problems.   Ezetimibe Other (See Comments)   Galantamine Hydrobromide Other (See Comments)   wt loss and dizziness   Iron Diarrhea   Pregabalin Other (See Comments)   Septra [sulfamethoxazole-trimethoprim] Other (See Comments)   2017 had a strange reaction she  says.   Vytorin [ezetimibe-simvastatin] Other (See Comments)   unknown   Benazepril Hcl Cough   Crestor [rosuvastatin Calcium] Other (See Comments)   cognitive   Lisinopril Cough        Medication List     STOP taking these medications    nitrofurantoin 100 MG capsule Commonly known as: MACRODANTIN       TAKE these medications    acetaminophen 650 MG CR tablet Commonly known as: TYLENOL Take 650 mg by mouth 2 (two) times daily.   apixaban 5 MG Tabs tablet Commonly known as: ELIQUIS Take 1 tablet (5 mg total) by mouth  2 (two) times daily.   cephALEXin 500 MG capsule Commonly known as: KEFLEX Take 1 capsule (500 mg total) by mouth 2 (two) times daily for 2 days.   escitalopram 10 MG tablet Commonly known as: LEXAPRO Take 10 mg by mouth daily.   metoprolol 200 MG 24 hr tablet Commonly known as: TOPROL-XL Take 100 mg by mouth 2 (two) times daily.   nitroGLYCERIN 0.4 MG SL tablet Commonly known as: NITROSTAT PLACE 1 TABLET (0.4 MG TOTAL) UNDER THE TONGUE EVERY 5 (FIVE) MINUTES AS NEEDED FOR CHEST PAIN.   olmesartan 20 MG tablet Commonly known as: BENICAR Take 10 mg by mouth daily.   polyethylene glycol 17 g packet Commonly known as: MIRALAX / GLYCOLAX Take 17 g by mouth daily as needed for mild constipation.   Repatha SureClick 140 MG/ML Soaj Generic drug: Evolocumab Inject 140 mg into the skin See admin instructions. Take 140mg  SQ every two weeks per patient        Follow-up Information     [facility].   Specialty: Home Health Services Why: The home health agency will contact you for the next home visit. Contact information: [address] [place] [address] [phone]         Rodrigo Ran, MD. Schedule an appointment as soon as possible for a visit in 1 week(s).   Specialty: Internal Medicine Contact information: [address] [place] [address] [phone]                Discharge Exam: Ceasar Mons Weights   11/19/22 1118 11/19/22 2305  Weight: 59 kg 53 kg   General exam: Appears calm and comfortable  Respiratory system: Clear to auscultation. Respiratory effort normal. Cardiovascular system: S1 & S2 heard, RRR. No JVD, Gastrointestinal system: Abdomen is nondistended, soft and nontender.  Central nervous system: Alert and oriented. No focal neurological deficits. Extremities: Symmetric 5 x 5 power. Skin: No rashes,  Psychiatry: Mood & affect appropriate.    Condition at discharge: fair  The results of significant  diagnostics from this hospitalization (including imaging, microbiology, ancillary and laboratory) are listed below for reference.   Imaging Studies: MR BRAIN WO CONTRAST  Result Date: 11/19/2022 CLINICAL DATA:  Neuro deficit, acute, stroke suspected. Previous strokes. Patient was found down. Patient has been disoriented for several days. EXAM: MRI HEAD WITHOUT CONTRAST TECHNIQUE: Multiplanar, multiecho pulse sequences of the brain and surrounding structures were obtained without intravenous contrast. COMPARISON:  CT head without contrast 11/19/2022 MR head without contrast 08/18/2022 FINDINGS: Brain: No acute infarct, hemorrhage, or mass lesion is present. The ventricles are of normal size. No significant extraaxial fluid collection is present. Moderate atrophy and white matter changes are noted. A remote left parietal infarct is present. Remote infarcts of the cerebellum bilaterally are stable. The brainstem is unremarkable. The internal auditory canals are within  normal limits. Midline structures are within normal limits. Vascular: Flow is present in the major intracranial arteries. Skull and upper cervical spine: The craniocervical junction is normal. Upper cervical spine is within normal limits. Marrow signal is unremarkable. Sinuses/Orbits: The paranasal sinuses and mastoid air cells are clear. Bilateral lens replacements are noted. Globes and orbits are otherwise unremarkable. IMPRESSION: 1. No acute intracranial abnormality or significant interval change. 2. Moderate atrophy and white matter disease likely reflects the sequela of chronic microvascular ischemia. 3. Remote left parietal infarct. 4. Remote infarcts of the cerebellum bilaterally. Electronically Signed   By: Marin Roberts M.D.   On: 11/19/2022 19:28   CT ABDOMEN PELVIS W CONTRAST  Result Date: 11/19/2022 CLINICAL DATA:  Abdominal pain. EXAM: CT ABDOMEN AND PELVIS WITH CONTRAST TECHNIQUE: Multidetector CT imaging of the abdomen and  pelvis was performed using the standard protocol following bolus administration of intravenous contrast. RADIATION DOSE REDUCTION: This exam was performed according to the departmental dose-optimization program which includes automated exposure control, adjustment of the mA and/or kV according to patient size and/or use of iterative reconstruction technique. CONTRAST:  75mL OMNIPAQUE IOHEXOL 350 MG/ML SOLN COMPARISON:  04/28/2022 and 03/26/2013 FINDINGS: Lower chest: Stable borderline cardiomegaly. Calcified plaque of the left anterior descending and right coronary arteries. Thoracic aorta is normal in caliber. Mild calcified plaque throughout the descending thoracic aorta. Possible small pericardial cyst versus loculated pleural fluid adjacent the inferior posterior right heart border unchanged from 2014. Mild bibasilar posterior dependent atelectasis. Two small nodular opacities along the right minor fissure with the larger measuring 5 mm. Hepatobiliary: 1.3 cm hypodensity of the right lobe of the liver unchanged and compatible with a cyst. Subcentimeter hypodensity of the inferior right lobe of the liver significantly decrease in size from 2014 and unchanged from 2023. Biliary tree and gallbladder are normal for. Pancreas: Normal. Spleen: Normal. Adrenals/Urinary Tract: Adrenal glands are normal. Kidneys are normal in size. No hydronephrosis or nephrolithiasis. 1.5 cm simple cyst of the upper pole right kidney. No follow-up imaging is recommended. Ureters and bladder are normal. Stomach/Bowel: Stomach and small bowel are normal. Appendix is normal. Diverticulosis of the sigmoid colon without active inflammation. Vascular/Lymphatic: Mild calcified plaque of the abdominal aorta which is normal in caliber. Remaining osseous structures unremarkable. No adenopathy. Reproductive: Uterus and bilateral adnexa are unremarkable. Other: No significant free fluid or focal inflammatory change. Musculoskeletal: Degenerative  change of the spine and hips. No focal abnormality. IMPRESSION: 1. No acute findings in the abdomen/pelvis. 2. Diverticulosis of the sigmoid colon without active inflammation. 3. Two small liver hypodensities unchanged from 2014 and likely benign cysts. 4. 1.5 cm right renal cyst.  No follow-up imaging is recommended for 5. Aortic atherosclerosis. Atherosclerotic coronary artery disease. 6. Two small nodular opacities along the right minor fissure with the larger measuring 5 mm. No follow-up needed if patient is low-risk (and has no known or suspected primary neoplasm). Non-contrast chest CT can be considered in 12 months if patient is high-risk. This recommendation follows the consensus statement: Guidelines for Management of Incidental Pulmonary Nodules Detected on CT Images: From the Fleischner Society 2017; Radiology 2017; 284:228-243. Aortic Atherosclerosis (ICD10-I70.0). Electronically Signed   By: Elberta Fortis M.D.   On: 11/19/2022 14:27   DG Pelvis Portable  Result Date: 11/19/2022 CLINICAL DATA:  Fall EXAM: PORTABLE PELVIS 1-2 VIEWS COMPARISON:  None Available. FINDINGS: Bones appear demineralized. There is no evidence of pelvic fracture or diastasis. No evidence of hip fracture or dislocation. No pelvic bone lesions  are seen. Atherosclerotic vascular calcifications. IMPRESSION: No acute osseous abnormality of the pelvis. Electronically Signed   By: Duanne Guess D.O.   On: 11/19/2022 12:45   DG Chest Portable 1 View  Result Date: 11/19/2022 CLINICAL DATA:  Pain after fall EXAM: PORTABLE CHEST 1 VIEW COMPARISON:  08/17/2022 x-ray and older FINDINGS: Overlapping cardiac leads. Osteopenia. No consolidation, pneumothorax or effusion. No edema. Normal cardiopericardial silhouette. Calcified aorta. Mild degenerative changes along the spine. IMPRESSION: No acute cardiopulmonary disease. Electronically Signed   By: Karen Kays M.D.   On: 11/19/2022 12:42   CT Head Wo Contrast  Result Date:  11/19/2022 CLINICAL DATA:  Head trauma.  Mental status change. EXAM: CT HEAD WITHOUT CONTRAST CT CERVICAL SPINE WITHOUT CONTRAST TECHNIQUE: Multidetector CT imaging of the head and cervical spine was performed following the standard protocol without intravenous contrast. Multiplanar CT image reconstructions of the cervical spine were also generated. RADIATION DOSE REDUCTION: This exam was performed according to the departmental dose-optimization program which includes automated exposure control, adjustment of the mA and/or kV according to patient size and/or use of iterative reconstruction technique. COMPARISON:  MRI 08/18/2022 FINDINGS: CT HEAD FINDINGS Brain: No evidence of acute infarction, hemorrhage, hydrocephalus, extra-axial collection or mass lesion/mass effect. Stable appearance of calcified meningioma overlying the right frontal lobe measuring 0.7 cm, image 17/5. There is mild diffuse low-attenuation within the subcortical and periventricular white matter compatible with chronic microvascular disease. Unchanged appearance of remote left parietal lobe infarct, image 17/3. Vascular: No hyperdense vessel or unexpected calcification. Skull: Normal. Negative for fracture or focal lesion. Sinuses/Orbits: Chronic mucosal thickening involving the maxillary sinuses, left greater than right. No sinus air-fluid levels. Mastoid air cells are clear. Other: None CT CERVICAL SPINE FINDINGS Alignment: No posttraumatic malalignment. First degree anterolisthesis of C4 on C5 is likely related to chronic spondylosis. Skull base and vertebrae: No acute fracture. No primary bone lesion or focal pathologic process. Soft tissues and spinal canal: No prevertebral fluid or swelling. No visible canal hematoma. Disc levels: Multilevel disc space narrowing and endplate spurring noted. This is most severe at C5-6 and C6-7. Bilateral facet arthropathy noted. Upper chest: Negative. Other: None IMPRESSION: 1. No acute intracranial  abnormality. 2. Chronic small vessel ischemic disease and remote left parietal lobe infarct. 3. Stable appearance of calcified meningioma overlying the right frontal lobe. 4. No evidence for cervical spine fracture or subluxation. 5. Cervical degenerative disc disease and facet arthropathy. Electronically Signed   By: Signa Kell M.D.   On: 11/19/2022 12:07   CT Cervical Spine Wo Contrast  Result Date: 11/19/2022 CLINICAL DATA:  Head trauma.  Mental status change. EXAM: CT HEAD WITHOUT CONTRAST CT CERVICAL SPINE WITHOUT CONTRAST TECHNIQUE: Multidetector CT imaging of the head and cervical spine was performed following the standard protocol without intravenous contrast. Multiplanar CT image reconstructions of the cervical spine were also generated. RADIATION DOSE REDUCTION: This exam was performed according to the departmental dose-optimization program which includes automated exposure control, adjustment of the mA and/or kV according to patient size and/or use of iterative reconstruction technique. COMPARISON:  MRI 08/18/2022 FINDINGS: CT HEAD FINDINGS Brain: No evidence of acute infarction, hemorrhage, hydrocephalus, extra-axial collection or mass lesion/mass effect. Stable appearance of calcified meningioma overlying the right frontal lobe measuring 0.7 cm, image 17/5. There is mild diffuse low-attenuation within the subcortical and periventricular white matter compatible with chronic microvascular disease. Unchanged appearance of remote left parietal lobe infarct, image 17/3. Vascular: No hyperdense vessel or unexpected calcification. Skull: Normal. Negative for  fracture or focal lesion. Sinuses/Orbits: Chronic mucosal thickening involving the maxillary sinuses, left greater than right. No sinus air-fluid levels. Mastoid air cells are clear. Other: None CT CERVICAL SPINE FINDINGS Alignment: No posttraumatic malalignment. First degree anterolisthesis of C4 on C5 is likely related to chronic spondylosis.  Skull base and vertebrae: No acute fracture. No primary bone lesion or focal pathologic process. Soft tissues and spinal canal: No prevertebral fluid or swelling. No visible canal hematoma. Disc levels: Multilevel disc space narrowing and endplate spurring noted. This is most severe at C5-6 and C6-7. Bilateral facet arthropathy noted. Upper chest: Negative. Other: None IMPRESSION: 1. No acute intracranial abnormality. 2. Chronic small vessel ischemic disease and remote left parietal lobe infarct. 3. Stable appearance of calcified meningioma overlying the right frontal lobe. 4. No evidence for cervical spine fracture or subluxation. 5. Cervical degenerative disc disease and facet arthropathy. Electronically Signed   By: Signa Kell M.D.   On: 11/19/2022 12:07    Microbiology: Results for orders placed or performed during the hospital encounter of 11/19/22  Urine Culture     Status: Abnormal   Collection Time: 11/19/22 12:40 PM   Specimen: Urine, Random  Result Value Ref Range Status   Specimen Description URINE, RANDOM  Final   Special Requests   Final    NONE Reflexed from [id number] Performed at [facility], [address] [place], [place] [address]    Culture >=100,000 COLONIES/mL KLEBSIELLA PNEUMONIAE (A)  Final   Report Status 11/21/2022 FINAL  Final   Organism ID, Bacteria KLEBSIELLA PNEUMONIAE (A)  Final      Susceptibility   Klebsiella pneumoniae - MIC*    AMPICILLIN >=32 RESISTANT Resistant     CEFAZOLIN <=4 SENSITIVE Sensitive     CEFEPIME <=0.12 SENSITIVE Sensitive     CEFTRIAXONE <=0.25 SENSITIVE Sensitive     CIPROFLOXACIN 1 RESISTANT Resistant     GENTAMICIN <=1 SENSITIVE Sensitive     IMIPENEM 0.5 SENSITIVE Sensitive     NITROFURANTOIN >=512 RESISTANT Resistant     TRIMETH/SULFA <=20 SENSITIVE Sensitive     AMPICILLIN/SULBACTAM 8 SENSITIVE Sensitive     PIP/TAZO 8 SENSITIVE Sensitive     * >=100,000 COLONIES/mL KLEBSIELLA PNEUMONIAE    Labs: CBC: Recent Labs   Lab 11/19/22 1140 11/19/22 1146 11/20/22 0054 11/21/22 0822  WBC 8.1  --  7.2 4.6  HGB 11.2* 12.2 10.4* 10.9*  HCT 36.1 36.0 32.1* 34.1*  MCV 100.0  --  97.6 94.5  PLT 226  --  194 209   Basic Metabolic Panel: Recent Labs  Lab 11/19/22 1140 11/19/22 1146 11/19/22 1440 11/20/22 0054 11/21/22 0822 11/22/22 0444  NA 136 138  --  137 136 138  K 3.1* 3.2*  --  3.5 3.1* 3.5  CL 99 100  --  103 104 102  CO2 27  --   --  25 24 27   GLUCOSE 143* 142*  --  98 91 88  BUN 8 9  --  6* 6* 6*  CREATININE 0.70 0.50  --  0.61 0.64 0.62  CALCIUM 10.5*  --   --  10.1 10.4* 10.5*  MG  --   --  1.7  --  1.6*  --   PHOS  --   --   --   --  2.3*  --    Liver Function Tests: Recent Labs  Lab 11/19/22 1140 11/20/22 0054 11/21/22 0822  AST 20 17  --   ALT 11 13  --  ALKPHOS 58 53  --   BILITOT 0.9 0.8  --   PROT 7.0 6.1*  --   ALBUMIN 3.7 3.1* 3.0*   CBG: Recent Labs  Lab 11/19/22 1207  GLUCAP 132*    Discharge time spent: 38 minutes.   Signed: Kathlen Mody, MD [facility] 11/22/2022

## 2022-11-22 NOTE — TOC Transition Note (Signed)
Transition of Care [facility]) - CM/SW Discharge Note   Patient Details  Name: Jeanette [name] MRN: [id number] Date of Birth: [birth year]  [facility]) CM/SW Contact:  Kermit Balo, RN Phone Number: 11/22/2022, 2:18 PM   Clinical Narrative:    Pt is discharging home with her spouse and home health therapy through [facility]. Information on the AVS.  Pt has transportation home.   Final next level of care: Home w Home Health Services Barriers to Discharge: No Barriers Identified   Patient Goals and CMS Choice CMS Medicare.gov Compare Post Acute Care list provided to:: Patient Choice offered to / list presented to : Patient  Discharge Placement                         Discharge Plan and Services Additional resources added to the After Visit Summary for     Discharge Planning Services: CM Consult Post Acute Care Choice: Home Health                    HH Arranged: PT [facility] Agency: [facility] Date [facility] Agency Contacted: 11/20/22   Representative spoke with at [facility] Agency: Kandee Keen  Social Determinants of Health (SDOH) Interventions SDOH Screenings   Food Insecurity: Patient Unable To Answer (11/19/2022)  Housing: Patient Unable To Answer (11/19/2022)  Utilities: Patient Unable To Answer (11/19/2022)  Tobacco Use: Low Risk  (11/19/2022)     Readmission Risk Interventions     No data to display

## 2022-12-10 ENCOUNTER — Other Ambulatory Visit: Payer: Self-pay

## 2022-12-10 ENCOUNTER — Emergency Department (HOSPITAL_COMMUNITY)
Admission: EM | Admit: 2022-12-10 | Discharge: 2022-12-10 | Disposition: A | Discharge status: Home / Self Care | Payer: Medicare Other | Attending: Emergency Medicine | Admitting: Emergency Medicine

## 2022-12-10 ENCOUNTER — Encounter (HOSPITAL_COMMUNITY): Payer: Self-pay

## 2022-12-10 DIAGNOSIS — E876 Hypokalemia: Secondary | ICD-10-CM | POA: Diagnosis not present

## 2022-12-10 DIAGNOSIS — N3000 Acute cystitis without hematuria: Secondary | ICD-10-CM | POA: Insufficient documentation

## 2022-12-10 DIAGNOSIS — Z7901 Long term (current) use of anticoagulants: Secondary | ICD-10-CM | POA: Insufficient documentation

## 2022-12-10 DIAGNOSIS — I959 Hypotension, unspecified: Secondary | ICD-10-CM | POA: Insufficient documentation

## 2022-12-10 DIAGNOSIS — D649 Anemia, unspecified: Secondary | ICD-10-CM | POA: Insufficient documentation

## 2022-12-10 DIAGNOSIS — I10 Essential (primary) hypertension: Secondary | ICD-10-CM | POA: Diagnosis not present

## 2022-12-10 DIAGNOSIS — E871 Hypo-osmolality and hyponatremia: Secondary | ICD-10-CM | POA: Insufficient documentation

## 2022-12-10 DIAGNOSIS — R1110 Vomiting, unspecified: Secondary | ICD-10-CM | POA: Diagnosis not present

## 2022-12-10 DIAGNOSIS — N3020 Other chronic cystitis without hematuria: Secondary | ICD-10-CM

## 2022-12-10 LAB — COMPREHENSIVE METABOLIC PANEL
ALT: 8 U/L (ref 0–44)
AST: 12 U/L — ABNORMAL LOW (ref 15–41)
Albumin: 2.8 g/dL — ABNORMAL LOW (ref 3.5–5.0)
Alkaline Phosphatase: 56 U/L (ref 38–126)
Anion gap: 7 (ref 5–15)
BUN: 9 mg/dL (ref 8–23)
CO2: 27 mmol/L (ref 22–32)
Calcium: 10 mg/dL (ref 8.9–10.3)
Chloride: 100 mmol/L (ref 98–111)
Creatinine, Ser: 0.69 mg/dL (ref 0.44–1.00)
GFR, Estimated: >60 mL/min (ref >60)
Glucose, Bld: 146 mg/dL — ABNORMAL HIGH (ref 70–99)
Potassium: 3.1 mmol/L — ABNORMAL LOW (ref 3.5–5.1)
Sodium: 134 mmol/L — ABNORMAL LOW (ref 135–145)
Total Bilirubin: 0.3 mg/dL (ref 0.3–1.2)
Total Protein: 5.9 g/dL — ABNORMAL LOW (ref 6.5–8.1)

## 2022-12-10 LAB — URINALYSIS, W/ REFLEX TO CULTURE (INFECTION SUSPECTED)
Bilirubin Urine: NEGATIVE
Glucose, UA: NEGATIVE mg/dL
Ketones, ur: NEGATIVE mg/dL
Nitrite: POSITIVE — AB
Protein, ur: NEGATIVE mg/dL
Specific Gravity, Urine: 1.003 — ABNORMAL LOW (ref 1.005–1.030)
pH: 6 (ref 5.0–8.0)

## 2022-12-10 LAB — CBC WITH DIFFERENTIAL/PLATELET
Abs Immature Granulocytes: 0.11 10*3/uL — ABNORMAL HIGH (ref 0.00–0.07)
Basophils Absolute: 0 10*3/uL (ref 0.0–0.1)
Basophils Relative: 0 %
Eosinophils Absolute: 0 10*3/uL (ref 0.0–0.5)
Eosinophils Relative: 0 %
HCT: 32 % — ABNORMAL LOW (ref 36.0–46.0)
Hemoglobin: 9.9 g/dL — ABNORMAL LOW (ref 12.0–15.0)
Immature Granulocytes: 2 %
Lymphocytes Relative: 16 %
Lymphs Abs: 1 10*3/uL (ref 0.7–4.0)
MCH: 31.1 pg (ref 26.0–34.0)
MCHC: 30.9 g/dL (ref 30.0–36.0)
MCV: 100.6 fL — ABNORMAL HIGH (ref 80.0–100.0)
Monocytes Absolute: 1.1 10*3/uL — ABNORMAL HIGH (ref 0.1–1.0)
Monocytes Relative: 18 %
Neutro Abs: 3.8 10*3/uL (ref 1.7–7.7)
Neutrophils Relative %: 64 %
Platelets: 172 10*3/uL (ref 150–400)
RBC: 3.18 MIL/uL — ABNORMAL LOW (ref 3.87–5.11)
RDW: 13.2 % (ref 11.5–15.5)
WBC: 6 10*3/uL (ref 4.0–10.5)
nRBC: 0 % (ref 0.0–0.2)

## 2022-12-10 MED ORDER — POTASSIUM CHLORIDE CRYS ER 20 MEQ PO TBCR
40.0000 meq | EXTENDED_RELEASE_TABLET | Freq: Once | ORAL | Status: AC
  Administered 2022-12-10: 40 meq via ORAL
  Filled 2022-12-10: qty 2

## 2022-12-10 NOTE — Discharge Instructions (Addendum)
I am glad you are feeling better. Vitals and blood labs were not concerning for sepsis today. Your potassium was low - I provided you a potassium supplement here in the ED today. We have also sent off a urine culture. No antibiotics given in ED today since you received a dose of ABX in primary care office today.   Seek emergency care if experiencing any new or worsening symptoms such as loss of consciousness, abdominal pain, severe fever, lethargy

## 2022-12-10 NOTE — ED Triage Notes (Signed)
Pt to ED via EMS from doctors office for hypotension and recurring UTI. Per EMS staff at doctors office stated pt BP was 90s systolic in office and they were about early sepsis. EMS stated facility reported pt has been getting treated for recurrent UTI with 8 differnet antibiotics. Staff gave pt rocephin shot today. NS Bolus PTA Vital signs with EMS Bp 136/74 HR 74 O2 sats 98%

## 2022-12-10 NOTE — ED Provider Notes (Signed)
[place] EMERGENCY DEPARTMENT AT [facility] Provider Note   CSN: [id number] Arrival date & time: 12/10/22  1205     History  Chief Complaint  Patient presents with   Hypotension    VELORA ARPIN is a 88 y.o. female with a PMH significant for HTN, HLD, Afib, TIA, and recurrent cystitis who presents today via EMS at the recommendation of her PCP for hypotension and bradycardia in the office. According to review of records, blood pressure was 90/70 with a HR of 68. Patient states she had one episode of emesis this morning right before her appointment. No other episodes since then. During the visit, she states she felt weak and presyncopal - stating that it could be due to her skipping breakfast that day. She was given a dose of Rocephin prior to leaving due to concern for sepsis.   On presentation today, blood pressure is 140/94 and heart rate of 74. Currently, she denies any symptoms, including chest pain, shortness of breath, abdominal pain, nausea, diarrhea, urinary frequency or urgency, dysuria, weakness, fever or chills.   She is under the care of urology for recurrent UTI. She has been treated a few times in the last few months. She is currently on long term Macrobid for prevention.   HPI     Home Medications Prior to Admission medications   Medication Sig Start Date End Date Taking? Authorizing Provider  acetaminophen (TYLENOL) 650 MG CR tablet Take 650 mg by mouth 2 (two) times daily.    [provider]  apixaban (ELIQUIS) 5 MG TABS tablet Take 1 tablet (5 mg total) by mouth 2 (two) times daily. 12/06/19   Rinehuls, David L, PA-C  escitalopram (LEXAPRO) 10 MG tablet Take 10 mg by mouth daily.    [provider]  metoprolol (TOPROL-XL) 200 MG 24 hr tablet Take 100 mg by mouth 2 (two) times daily.    [provider]  nitroGLYCERIN (NITROSTAT) 0.4 MG SL tablet PLACE 1 TABLET (0.4 MG TOTAL) UNDER THE TONGUE EVERY 5 (FIVE) MINUTES AS NEEDED FOR  CHEST PAIN. 07/13/19 08/23/22  Patwardhan, Anabel Bene, MD  olmesartan (BENICAR) 20 MG tablet Take 1 tablet (20 mg total) by mouth daily. 11/22/22   Kathlen Mody, MD  polyethylene glycol (MIRALAX / GLYCOLAX) 17 g packet Take 17 g by mouth daily as needed for mild constipation. 11/22/22   Kathlen Mody, MD  REPATHA SURECLICK 140 MG/ML SOAJ Inject 140 mg into the skin See admin instructions. Take 140mg  SQ every two weeks per patient 10/12/19   [provider]      Allergies    Epinephrine, Cozaar [losartan], Crestor [rosuvastatin], Ezetimibe, Galantamine hydrobromide, Iron, Pregabalin, Septra [sulfamethoxazole-trimethoprim], Vytorin [ezetimibe-simvastatin], Benazepril hcl, Crestor [rosuvastatin calcium], and Lisinopril    Review of Systems   Review of Systems  Gastrointestinal:  Positive for vomiting.    Physical Exam Updated Vital Signs BP 135/69   Pulse 69   Temp 97.6 F (36.4 C)   Resp 14   SpO2 97%  Physical Exam Vitals and nursing note reviewed.  Constitutional:      General: She is not in acute distress.    Appearance: She is not ill-appearing or toxic-appearing.  HENT:     Head: Normocephalic and atraumatic.     Mouth/Throat:     Mouth: Mucous membranes are moist.  Eyes:     General: No scleral icterus.       Right eye: No discharge.  Left eye: No discharge.     Conjunctiva/sclera: Conjunctivae normal.  Cardiovascular:     Rate and Rhythm: Normal rate and regular rhythm.     Pulses: Normal pulses.     Heart sounds: Normal heart sounds. No murmur heard. Pulmonary:     Effort: Pulmonary effort is normal. No respiratory distress.     Breath sounds: Normal breath sounds. No wheezing, rhonchi or rales.  Abdominal:     General: Abdomen is flat. Bowel sounds are normal. There is no distension.     Palpations: Abdomen is soft. There is no mass.     Tenderness: There is no abdominal tenderness.     Comments: No abdominal tenderness to palpation. No CVA tenderness.   Musculoskeletal:     Right lower leg: No edema.     Left lower leg: No edema.  Skin:    General: Skin is warm and dry.     Findings: No rash.  Neurological:     General: No focal deficit present.     Mental Status: She is alert and oriented to person, place, and time. Mental status is at baseline.  Psychiatric:        Mood and Affect: Mood normal.        Behavior: Behavior normal.     ED Results / Procedures / Treatments   Labs (all labs ordered are listed, but only abnormal results are displayed) Labs Reviewed  COMPREHENSIVE METABOLIC PANEL - Abnormal; Notable for the following components:      Result Value   Sodium 134 (*)    Potassium 3.1 (*)    Glucose, Bld 146 (*)    Total Protein 5.9 (*)    Albumin 2.8 (*)    AST 12 (*)    All other components within normal limits  CBC WITH DIFFERENTIAL/PLATELET - Abnormal; Notable for the following components:   RBC 3.18 (*)    Hemoglobin 9.9 (*)    HCT 32.0 (*)    MCV 100.6 (*)    Monocytes Absolute 1.1 (*)    Abs Immature Granulocytes 0.11 (*)    All other components within normal limits  URINALYSIS, W/ REFLEX TO CULTURE (INFECTION SUSPECTED) - Abnormal; Notable for the following components:   Color, Urine STRAW (*)    Specific Gravity, Urine 1.003 (*)    Hgb urine dipstick SMALL (*)    Nitrite POSITIVE (*)    Leukocytes,Ua MODERATE (*)    Bacteria, UA MANY (*)    All other components within normal limits  URINE CULTURE    EKG EKG Interpretation  Date/Time:  Monday December 10 2022 12:17:11 EDT Ventricular Rate:  75 PR Interval:  162 QRS Duration: 74 QT Interval:  448 QTC Calculation: 501 R Axis:   68 Text Interpretation: Ectopic atrial rhythm Borderline T wave abnormalities Prolonged QT interval Confirmed by Margarita Grizzle [id number]) on 12/10/2022 1:03:35 PM  Radiology No results found.  Procedures Procedures    Medications Ordered in ED Medications  potassium chloride SA (KLOR-CON M) CR tablet 40 mEq (40 mEq Oral  Given 12/10/22 1419)    ED Course/ Medical Decision Making/ A&P                             Medical Decision Making Amount and/or Complexity of Data Reviewed Labs: ordered.  Risk Prescription drug management.   This patient presents to the ED for concern of syncope, this involves an extensive number of treatment options,  and is a complaint that carries with it a high risk of complications and morbidity.  The differential diagnosis includes CVA, ICH, intracranial mass, critical dehydration, endocrine abnormality, sepsis/infection, electrolyte abnormality, cardiac arrhythmia.   Co morbidities that complicate the patient evaluation  HTN, HLD, atrial fibrillation, previous stroke and TIA, and recurrent cystitis     Lab Tests:  I Ordered, and personally interpreted labs.  The pertinent results include:   -UA: concerning for UTI -Urine culture: pending -CMP: hypokalemia; mild hyponatremia -CBC: anemia (9.9); No concern for leukocytosis    Cardiac Monitoring: / EKG:  The patient was maintained on a cardiac monitor.  I personally viewed and interpreted the cardiac monitored which showed an underlying rhythm of: sinus rhythm without acute ST changes    Problem List / ED Course / Critical interventions / Medication management  Patient presents to ED sent from her PCP concerned for hypotension and bradycardia.  Patient with history of recurring UTI.  Patient endorses 1 episode of nonbloody vomiting before her PCP appointment today.  Patient was hypotensive and bradycardic at PCP appointment-so provider sent her to ED concerned for sepsis.  Patient received Rocephin shot PCP and a 300 mL NS bolus PTA. Here in emergency room, patient without hypotension or bradycardia.  Blood pressure 135/69, HR 69.  Patient also afebrile and without abdominal tenderness to palpation or CVA tenderness.  CBC without concern for leukocytosis.  Patient with anemia - it seems the patient is anemic at  baseline.  Patient with 3.1 potassium-I provided patient with potassium supplement here in ED. Straight-cath urine culture concerning for UTI.  Patient already received dose of Rocephin today at PCP - so I will not be providing her with another dose today in the ED.  Sent urinalysis for urine culture.  Patient to continue her antibiotic course prescribed by her PCP and await further results of her urine culture.  Patient verbally agrees to plan. Patient with positive response to 300 mL NS bolus during EMS transport today.  I believe her hypertensive episode earlier today could be due to dehydration.  Patient afebrile with stable vitals.  Labs not concerning for critical dehydration.  Patient states she feels well and is ready to go home. I have reviewed the patients home medicines and have made adjustments as needed Patient was given return precautions. Patient stable for discharge at this time.  Patient verbalized understanding of plan.  Ddx: these are considered less likely due to history of present illness and physical exam -CVA/ICH/intracranial mass: patient without neurodeficits; no history of seizure -Critical dehydration: BMP/CMP without concern -Endocrine abnormality -Sepsis/infection: SIRs criteria not met; patient afebrile without infectious symptoms -Electrolyte abnormality: BMP/CMP without concern  -Cardiac arrhythmia: EKG shows NSR without acute ST changes   Social Determinants of Health:  none            Final Clinical Impression(s) / ED Diagnoses Final diagnoses:  Acute cystitis without hematuria    Rx / DC Orders ED Discharge Orders     None         Dorothymae, [place] 12/10/22 2129    Margarita Grizzle, MD 12/11/22 1244

## 2022-12-11 LAB — URINE CULTURE

## 2022-12-12 LAB — URINE CULTURE: Culture: 40000 — AB

## 2022-12-13 ENCOUNTER — Telehealth (HOSPITAL_BASED_OUTPATIENT_CLINIC_OR_DEPARTMENT_OTHER): Payer: Self-pay | Admitting: *Deleted

## 2022-12-13 NOTE — Telephone Encounter (Signed)
Post ED Visit - Positive Culture Follow-up  Culture report reviewed by antimicrobial stewardship pharmacist: [facility] Team [x]  Lovey Newcomer, Pharm.D. []  Celedonio Miyamoto, Pharm.D., BCPS AQ-ID []  Garvin Fila, Pharm.D., BCPS []  Georgina Pillion, Pharm.D., BCPS []  [place], [address].D., BCPS, AAHIVP []  Estella Husk, Pharm.D., BCPS, AAHIVP []  Lysle Pearl, PharmD, BCPS []  Phillips Climes, PharmD, BCPS []  Agapito Games, PharmD, BCPS []  Verlan Friends, PharmD []  Mervyn Gay, PharmD, BCPS []  Vinnie Level, PharmD  [facility] Team []  Len Childs, PharmD []  Greer Pickerel, PharmD []  Adalberto Cole, PharmD []  Perlie Gold, Rph []  Lonell Face) Jean Rosenthal, PharmD []  Earl Many, PharmD []  Junita Push, PharmD []  Dorna Leitz, PharmD []  Terrilee Files, PharmD []  Lynann Beaver, PharmD []  Keturah Barre, PharmD []  Loralee Pacas, PharmD []  Bernadene Person, PharmD   Positive urine culture Treated with no new abx, Patient takes Macrobid long tern per urology, organism sensitive to the same and no further patient follow-up is required at this time.  Bing Quarry 12/13/2022, 9:40 AM

## 2022-12-17 ENCOUNTER — Other Ambulatory Visit: Payer: Self-pay | Admitting: Internal Medicine

## 2022-12-17 DIAGNOSIS — Z1231 Encounter for screening mammogram for malignant neoplasm of breast: Secondary | ICD-10-CM

## 2023-03-05 ENCOUNTER — Other Ambulatory Visit: Payer: Self-pay | Admitting: Adult Health

## 2023-03-05 DIAGNOSIS — N6325 Unspecified lump in the left breast, overlapping quadrants: Secondary | ICD-10-CM

## 2023-03-05 DIAGNOSIS — Z853 Personal history of malignant neoplasm of breast: Secondary | ICD-10-CM

## 2023-04-02 ENCOUNTER — Ambulatory Visit
Admission: RE | Admit: 2023-04-02 | Discharge: 2023-04-02 | Disposition: A | Discharge status: Home / Self Care | Payer: Medicare Other | Source: Ambulatory Visit | Attending: Adult Health

## 2023-04-02 ENCOUNTER — Ambulatory Visit
Admission: RE | Admit: 2023-04-02 | Discharge: 2023-04-02 | Disposition: A | Discharge status: Home / Self Care | Payer: Medicare Other | Source: Ambulatory Visit | Attending: Adult Health | Admitting: Adult Health

## 2023-04-02 DIAGNOSIS — Z853 Personal history of malignant neoplasm of breast: Secondary | ICD-10-CM

## 2023-04-02 DIAGNOSIS — N6325 Unspecified lump in the left breast, overlapping quadrants: Secondary | ICD-10-CM

## 2023-04-26 ENCOUNTER — Other Ambulatory Visit: Payer: Self-pay | Admitting: Hematology and Oncology

## 2023-04-26 ENCOUNTER — Inpatient Hospital Stay: Payer: Medicare Other | Attending: Hematology and Oncology

## 2023-04-26 ENCOUNTER — Inpatient Hospital Stay: Payer: Medicare Other | Admitting: Hematology and Oncology

## 2023-04-26 DIAGNOSIS — D500 Iron deficiency anemia secondary to blood loss (chronic): Secondary | ICD-10-CM

## 2023-04-26 NOTE — Progress Notes (Signed)
[facility] [phone]   [phone]  PROGRESS NOTE  Patient Care Team: Rodrigo Ran, MD as PCP - General (Internal Medicine) Mealor, Roberts Gaudy, MD as PCP - Electrophysiology (Cardiology)  Hematological/Oncological History  # Iron Deficiency Anemia 2/2 Unclear Etiology  # Anemia of Chronic Disease/Inflammation  08/04/2021: WBC 7.05, Hgb 8.7, MCV 82.7, Plt 407. Ferritin 149, Iron sat 9%, TIBC 203, Iron 19.  Cr 0.7 08/11/2021: establish care with Dr. Leonides Schanz 08/18/2021-08/25/2021: Patient received IV Feraheme 510 mg x 2 doses 01/22/2022: White blood cell count 4.9, hemoglobin 9.5, MCV 100.7, platelets of 284. Iron sat of 14% with ferritin of 199 04/26/2022: WBC 4.3, Hgb 10.3, MCV 98.2, Plt 243, Iron 49, saturation 21% Interval History:  COCO MENZIE 88 y.o. female with medical history significant for iron deficiency anemia of unclear etiology who presents for a follow up visit.   On exam today Mrs. 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 LV EF 74%.   GYNECOLOGIC CRYOSURGERY     IR CT HEAD LTD  12/04/2019   IR PERCUTANEOUS ART THROMBECTOMY/INFUSION INTRACRANIAL INC DIAG ANGIO   12/04/2019   IR US GUIDE VASC ACCESS LEFT  12/04/2019   OPEN REDUCTION INTERNAL FIXATION (ORIF) DISTAL RADIAL FRACTURE Left 02/19/2017   Procedure: OPEN REDUCTION INTERNAL FIXATION (ORIF) LEFT DISTAL RADIAL FRACTURE;  Surgeon: Betha Loa, MD;  Location: [facility];  Service: Orthopedics;  Laterality: Left;   OVARIAN CYST REMOVAL     RADIOLOGY WITH ANESTHESIA N/A 12/04/2019   Procedure: IR WITH ANESTHESIA;  Surgeon: Julieanne Cotton, MD;  Location: [facility] OR;  Service: Radiology;  Laterality: N/A;   RENAL DOPPLER  09/09/2012   Right proximal renal artery: 60-99% diameter reduction. Normal patency of the left main renal artery.    TRANSTHORACIC ECHOCARDIOGRAM  03/22/2006   EF 55-60%, mild mitral valvular regurg, pulmonary artery systolic pressure was moderately increased.   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  Tobacco Use   Smoking status: Never   Smokeless tobacco: Never  Vaping Use   Vaping status: Never Used  Substance and Sexual Activity   Alcohol use: Yes    Alcohol/week: 2.0 standard drinks of alcohol    Types:  2 Glasses of wine per week    Comment: rare   Drug use: No   Sexual activity: Yes    Partners: Male    Birth control/protection: Post-menopausal  Other Topics Concern   Not on file  Social History Narrative   Lives with spouse   Social Determinants of Health   Financial Resource Strain: Not on file  Food Insecurity: Low Risk  (04/22/2023)   Received from [facility]   Hunger Vital Sign    Worried About Running Out of Food in the Last Year: Never true    Ran Out of Food in the Last Year: Never true  Transportation Needs: No Transportation Needs (04/22/2023)   Received from [employer]    In the past 12 months, has lack of reliable transportation kept you from medical appointments, meetings, work or from getting things needed for daily living? : No  Physical Activity: Not on file  Stress: Not on file  Social Connections: Not on file  Intimate Partner Violence: Not on file    FAMILY HISTORY: Family History  Problem Relation Age of Onset   Alzheimer's disease Mother    Pneumonia Mother    Heart failure Father    Heart disease Sister    Heart disease Brother    Heart disease Brother    Heart disease Sister    Pneumonia Sister     ALLERGIES:  is allergic to epinephrine, cozaar [losartan], crestor [rosuvastatin], ezetimibe, galantamine hydrobromide, iron, pregabalin, septra [sulfamethoxazole-trimethoprim], vytorin [ezetimibe-simvastatin], benazepril hcl, crestor [rosuvastatin calcium], and lisinopril.  MEDICATIONS:  Current Outpatient Medications  Medication Sig Dispense Refill   acetaminophen (TYLENOL) 650 MG CR tablet Take 650 mg by mouth 2 (two) times daily.     apixaban (ELIQUIS) 5 MG TABS tablet Take 1 tablet (5 mg total) by mouth 2 (two) times daily. 60 tablet 1   escitalopram (LEXAPRO) 10 MG tablet Take 10 mg by mouth daily.     metoprolol (TOPROL-XL) 200 MG 24 hr tablet Take 100 mg by mouth 2 (two) times daily.     nitroGLYCERIN (NITROSTAT) 0.4 MG SL tablet PLACE 1 TABLET (0.4 MG TOTAL) UNDER THE TONGUE EVERY 5 (FIVE) MINUTES AS NEEDED FOR CHEST PAIN. 75 tablet 1   olmesartan (BENICAR) 20 MG tablet Take 1 tablet (20 mg total) by mouth daily. 30 tablet 1   polyethylene glycol (MIRALAX / GLYCOLAX) 17 g packet Take 17 g by mouth daily as needed for mild constipation. 14 each 0   REPATHA SURECLICK 140 MG/ML SOAJ Inject 140 mg into the skin See admin instructions. Take 140mg  SQ every two weeks per patient     No current facility-administered medications for this visit.    REVIEW OF SYSTEMS:   Constitutional: ( - ) fevers, ( - )  chills , ( - )  night sweats Eyes: ( - ) blurriness of vision, ( - ) double vision, ( - ) watery eyes Ears, nose, mouth, throat, and face: ( - ) mucositis, ( - ) sore throat Respiratory: ( - ) cough, ( - ) dyspnea, ( - ) wheezes Cardiovascular: ( - ) palpitation, ( - ) chest discomfort, ( - ) lower extremity swelling Gastrointestinal:  ( - ) nausea, ( - ) heartburn, ( - ) change in bowel habits Skin: ( - ) abnormal skin rashes Lymphatics: ( - ) new lymphadenopathy, ( - ) easy bruising Neurological: ( - ) numbness, ( - ) tingling, ( - ) new weaknesses Behavioral/Psych: ( - )  mood change, ( - ) new changes  All other systems were reviewed with the patient and are negative.  PHYSICAL EXAMINATION:  There were no vitals filed for this visit.   There were no vitals filed for this visit.    GENERAL: Well-appearing elderly Caucasian female, alert, no distress and comfortable SKIN: skin color, texture, turgor are normal, no rashes or significant lesions EYES: conjunctiva are pink and non-injected, sclera clear LUNGS: clear to auscultation and percussion with normal breathing effort HEART: regular rate & rhythm and no murmurs and no lower extremity edema Musculoskeletal: no cyanosis of digits and no clubbing  PSYCH: alert & oriented x 3, fluent speech NEURO: no focal motor/sensory deficits  LABORATORY DATA:  I have reviewed the data as listed    Latest Ref Rng & Units 12/10/2022   12:50 PM 11/21/2022    8:22 AM 11/20/2022   12:54 AM  CBC  WBC 4.0 - 10.5 K/uL 6.0  4.6  7.2   Hemoglobin 12.0 - 15.0 g/dL 9.9  26.3  78.5   Hematocrit 36.0 - 46.0 % 32.0  34.1  32.1   Platelets 150 - 400 K/uL 172  209  194        Latest Ref Rng & Units 12/10/2022   12:50 PM 11/22/2022    4:44 AM 11/21/2022    8:22 AM  CMP  Glucose 70 - 99 mg/dL 885  88  91   BUN 8 - 23 mg/dL 9  6  6    Creatinine 0.44 - 1.00 mg/dL 0.27  7.41  2.87   Sodium 135 - 145 mmol/L 134  138  136   Potassium 3.5 - 5.1 mmol/L 3.1  3.5  3.1    Chloride 98 - 111 mmol/L 100  102  104   CO2 22 - 32 mmol/L 27  27  24    Calcium 8.9 - 10.3 mg/dL 86.7  67.2  09.4   Total Protein 6.5 - 8.1 g/dL 5.9     Total Bilirubin 0.3 - 1.2 mg/dL 0.3     Alkaline Phos 38 - 126 U/L 56     AST 15 - 41 U/L 12     ALT 0 - 44 U/L 8       RADIOGRAPHIC STUDIES: MM 3D DIAGNOSTIC MAMMOGRAM BILATERAL BREAST  Result Date: 04/02/2023 CLINICAL DATA:  88 year old female presenting for evaluation of a tender palpable area of concern in the central left breast. She has history of right breast cancer status post lumpectomy in 1989. She has had a benign surgical excision of the left breast in 2004. EXAM: DIGITAL DIAGNOSTIC BILATERAL MAMMOGRAM WITH TOMOSYNTHESIS AND CAD; ULTRASOUND LEFT BREAST LIMITED TECHNIQUE: Bilateral digital diagnostic mammography and breast tomosynthesis was performed. The images were evaluated with computer-aided detection. ; Targeted ultrasound examination of the left breast was performed. COMPARISON:  Previous exam(s). ACR Breast Density Category c: The breasts are heterogeneously dense, which may obscure small masses. FINDINGS: No suspicious calcifications, masses or areas of distortion are seen in the bilateral breasts. Ultrasound targeted to the central to upper outer left breast demonstrates normal fibroglandular tissue. No suspicious masses or areas of shadowing are identified. IMPRESSION: 1. There are no suspicious mammographic or targeted sonographic abnormalities in the palpable region of concern in the left breast. 2. No suspicious calcifications, masses or areas of distortion are seen in the bilateral breasts. RECOMMENDATION: 1. Clinical follow-up recommended for the tender palpable area of concern in the left breast. Any further workup should be based on  clinical grounds. 2.  Screening mammogram in one year.(Code:SM-B-01Y) I have discussed the findings and recommendations with the patient. If applicable, a reminder letter will be sent to the  patient regarding the next appointment. BI-RADS CATEGORY  1: Negative. Electronically Signed   By: Frederico Hamman M.D.   On: 04/02/2023 14:25   [place] LIMITED ULTRASOUND INCLUDING AXILLA LEFT BREAST   Result Date: 04/02/2023 CLINICAL DATA:  88 year old female presenting for evaluation of a tender palpable area of concern in the central left breast. She has history of right breast cancer status post lumpectomy in 1989. She has had a benign surgical excision of the left breast in 2004. EXAM: DIGITAL DIAGNOSTIC BILATERAL MAMMOGRAM WITH TOMOSYNTHESIS AND CAD; ULTRASOUND LEFT BREAST LIMITED TECHNIQUE: Bilateral digital diagnostic mammography and breast tomosynthesis was performed. The images were evaluated with computer-aided detection. ; Targeted ultrasound examination of the left breast was performed. COMPARISON:  Previous exam(s). ACR Breast Density Category c: The breasts are heterogeneously dense, which may obscure small masses. FINDINGS: No suspicious calcifications, masses or areas of distortion are seen in the bilateral breasts. Ultrasound targeted to the central to upper outer left breast demonstrates normal fibroglandular tissue. No suspicious masses or areas of shadowing are identified. IMPRESSION: 1. There are no suspicious mammographic or targeted sonographic abnormalities in the palpable region of concern in the left breast. 2. No suspicious calcifications, masses or areas of distortion are seen in the bilateral breasts. RECOMMENDATION: 1. Clinical follow-up recommended for the tender palpable area of concern in the left breast. Any further workup should be based on clinical grounds. 2.  Screening mammogram in one year.(Code:SM-B-01Y) I have discussed the findings and recommendations with the patient. If applicable, a reminder letter will be sent to the patient regarding the next appointment. BI-RADS CATEGORY  1: Negative. Electronically Signed   By: Frederico Hamman M.D.   On: 04/02/2023 14:25     ASSESSMENT & PLAN SHAMAR HOLAND 88 y.o. female with medical history significant for iron deficiency anemia of unclear etiology who presents for a follow up visit.   # Iron Deficiency Anemia 2/2 Unclear Etiology  # Anemia of Chronic Disease/Inflammation  -- Findings are consistent with anemia of chronic disease with a likely component of iron deficiency anemia secondary to an unclear etiology --Patient had markedly elevated inflammatory markers previously and has known history of rheumatoid arthritis. -- Patient unable to tolerate p.o. iron therapy due to diarrhea. -- Patient is status post 2 doses of IV Feraheme in February 2023 --Labs today show white blood cell count *** --Monitor for now and no indication for IV iron at this time.  --RTC in 6 months time to reassesses.   #Recurrent UTI #Urinary incontinence #Flank pain: --Patient is under the care of [facility] Urology team --Scheduled for a follow up this upcoming Monday  No orders of the defined types were placed in this encounter.   All questions were answered. The patient knows to call the clinic with any problems, questions or concerns.  I have spent a total of 30 minutes minutes of face-to-face and non-face-to-face time, preparing to see the patient, performing a medically appropriate examination, counseling and educating the patient, documenting clinical information in the electronic health record, and care coordination.    Ulysees Barns, MD Department of Hematology/Oncology [facility] at [facility] Phone: [phone] Pager: [phone] Email: [email or web address]    04/26/2023 7:22 AM

## 2023-05-14 ENCOUNTER — Telehealth: Payer: Self-pay | Admitting: Hematology and Oncology

## 2023-05-15 ENCOUNTER — Other Ambulatory Visit: Payer: Self-pay | Admitting: Physician Assistant

## 2023-05-15 DIAGNOSIS — D539 Nutritional anemia, unspecified: Secondary | ICD-10-CM

## 2023-05-15 NOTE — Progress Notes (Deleted)
Jeanette [name]  Jeanette Ran, MD [address] [place] [address]  DIAGNOSIS:  # Iron Deficiency Anemia 2/2 Unclear Etiology  # Anemia of Chronic Disease/Inflammation  08/04/2021: WBC 7.05, Hgb 8.7, MCV 82.7, Plt 407. Ferritin 149, Iron sat 9%, TIBC 203, Iron 19.  Cr 0.7 08/11/2021: establish care with Dr. Leonides Schanz 08/18/2021-08/25/2021: Patient received IV Feraheme 510 mg x 2 doses 01/22/2022: White blood cell count 4.9, hemoglobin 9.5, MCV 100.7, platelets of 284. Iron sat of 14% with ferritin of 199 04/26/2022: WBC 4.3, Hgb 10.3, MCV 98.2, Plt 243, Iron 49, saturation 21% 10/26/22: WBC 4.8, Hbg 11.7, MCV 100.3, Plt 206, iron 83, and saturation ***, ferritin 94  PRIOR THERAPY: IV iron PRN, the most recent being feraheme 08/24/21  CURRENT THERAPY: Observation (unable to tolerate oral iron due to diarrhea)   INTERVAL HISTORY: Jeanette [name] 88 y.o. female returns to clinic today for follow-up visit accompanied by*** . She is followed for anemia felt to be related to chronic disease. She received IV iron once in February 2023. She is unable to tolerate oral iron due to diarrhea.   In the interval since last being seen, she was hospitalized in May 2024 for UTI and encephalopathy. She follows with urology at [facility] for recurrent UTIs. She has other comorbidity with CHF, RA, PAF, and HTN.   Today, she is feeling ***. Her energy is ***. She denies any visible bleeding including epistaxis, gingival bleeding, hematemesis, melena, or hematochezia. Denies hematuria. Denies shortness of breath, cough, abdominal pain, nausea, vomiting, or changes in her bowel habits. Multivitamins? She is here for evaluation and repeat blood work.   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  MEDICATIONS:  Current Outpatient Medications  Medication Sig Dispense Refill   acetaminophen (TYLENOL) 650 MG CR tablet Take 650 mg by mouth 2 (two) times daily.     apixaban (ELIQUIS) 5 MG TABS tablet Take 1 tablet (5 mg total) by mouth 2 (two) times daily. 60 tablet 1   escitalopram (LEXAPRO) 10 MG tablet Take 10 mg by mouth daily.     metoprolol (TOPROL-XL) 200 MG 24 hr tablet Take 100 mg by mouth 2 (two) times daily.     nitroGLYCERIN (NITROSTAT) 0.4 MG SL tablet PLACE 1 TABLET (0.4 MG TOTAL) UNDER THE TONGUE EVERY 5 (FIVE) MINUTES AS NEEDED FOR CHEST PAIN. 75 tablet 1   olmesartan (BENICAR) 20 MG tablet Take 1 tablet (20 mg total) by mouth daily. 30 tablet 1   polyethylene glycol (MIRALAX / GLYCOLAX) 17 g packet Take 17 g by mouth daily as needed for mild constipation.  14 each 0   REPATHA SURECLICK 140 MG/ML SOAJ Inject 140 mg into the skin See admin instructions. Take 140mg  SQ every two weeks per patient     No current facility-administered medications for this visit.    SURGICAL HISTORY:  Past Surgical History:  Procedure Laterality Date   BREAST BIOPSY Left 01/14/2003   BREAST LUMPECTOMY Right 1989   CARDIOVASCULAR STRESS TEST  07/27/2003   No evidence if LV myocardial ischemia or scar. LV EF 74%.   GYNECOLOGIC CRYOSURGERY     IR CT HEAD LTD  12/04/2019    IR PERCUTANEOUS ART THROMBECTOMY/INFUSION INTRACRANIAL INC DIAG ANGIO  12/04/2019   IR US GUIDE VASC ACCESS LEFT  12/04/2019   OPEN REDUCTION INTERNAL FIXATION (ORIF) DISTAL RADIAL FRACTURE Left 02/19/2017   Procedure: OPEN REDUCTION INTERNAL FIXATION (ORIF) LEFT DISTAL RADIAL FRACTURE;  Surgeon: Betha Loa, MD;  Location: [facility];  Service: Orthopedics;  Laterality: Left;   OVARIAN CYST REMOVAL     RADIOLOGY WITH ANESTHESIA N/A 12/04/2019   Procedure: IR WITH ANESTHESIA;  Surgeon: Julieanne Cotton, MD;  Location: [facility];  Service: Radiology;  Laterality: N/A;   RENAL DOPPLER  09/09/2012   Right proximal renal artery: 60-99% diameter reduction. Normal patency of the left main renal artery.    TRANSTHORACIC ECHOCARDIOGRAM  03/22/2006   EF 55-60%, mild mitral valvular regurg, pulmonary artery systolic pressure was moderately increased.   TUBAL LIGATION      REVIEW OF SYSTEMS:   Review of Systems  Constitutional: Negative for appetite change, chills, fatigue, fever and unexpected weight change.  HENT:   Negative for mouth sores, nosebleeds, sore throat and trouble swallowing.   Eyes: Negative for eye problems and icterus.  Respiratory: Negative for cough, hemoptysis, shortness of breath and wheezing.   Cardiovascular: Negative for chest pain and leg swelling.  Gastrointestinal: Negative for abdominal pain, constipation, diarrhea, nausea and vomiting.  Genitourinary: Negative for bladder incontinence, difficulty urinating, dysuria, frequency and hematuria.   Musculoskeletal: Negative for back pain, gait problem, neck pain and neck stiffness.  Skin: Negative for itching and rash.  Neurological: Negative for dizziness, extremity weakness, gait problem, headaches, light-headedness and seizures.  Hematological: Negative for adenopathy. Does not bruise/bleed easily.  Psychiatric/Behavioral: Negative for confusion, depression and sleep disturbance. The patient is not nervous/anxious.      PHYSICAL EXAMINATION:  There were no vitals taken for this visit.  ECOG PERFORMANCE STATUS: {CHL ONC ECOG [id number]  Physical Exam  Constitutional: Oriented to person, place, and time and well-developed, well-nourished, and in no distress. No distress.  HENT:  Head: Normocephalic and atraumatic.  Mouth/Throat: Oropharynx is clear and moist. No oropharyngeal exudate.  Eyes: Conjunctivae are normal. Right eye exhibits no discharge. Left eye exhibits no discharge. No scleral icterus.  Neck: Normal range of motion. Neck supple.  Cardiovascular: Normal rate, regular rhythm, normal heart sounds and intact distal pulses.   Pulmonary/Chest: Effort normal and breath sounds normal. No respiratory distress. No wheezes. No rales.  Abdominal: Soft. Bowel sounds are normal. Exhibits no distension and no mass. There is no tenderness.  Musculoskeletal: Normal range of motion. Exhibits no edema.  Lymphadenopathy:    No cervical adenopathy.  Neurological: Alert and oriented to person, place, and time. Exhibits normal muscle tone. Gait normal. Coordination normal.  Skin: Skin is warm and dry. No rash noted. Not diaphoretic. No erythema. No pallor.  Psychiatric: Mood, memory and judgment normal.  Vitals reviewed.  LABORATORY DATA: Lab Results  Component Value Date  WBC 6.0 12/10/2022   HGB 9.9 (L) 12/10/2022   HCT 32.0 (L) 12/10/2022   MCV 100.6 (H) 12/10/2022   PLT 172 12/10/2022      Chemistry      Component Value Date/Time   NA 134 (L) 12/10/2022 1250   K 3.1 (L) 12/10/2022 1250   CL 100 12/10/2022 1250   CO2 27 12/10/2022 1250   BUN 9 12/10/2022 1250   CREATININE 0.69 12/10/2022 1250   CREATININE 0.71 10/26/2022 1014      Component Value Date/Time   CALCIUM 10.0 12/10/2022 1250   ALKPHOS 56 12/10/2022 1250   AST 12 (L) 12/10/2022 1250   AST 11 (L) 10/26/2022 1014   ALT 8 12/10/2022 1250   ALT 6 10/26/2022 1014   BILITOT 0.3 12/10/2022 1250   BILITOT 0.4 10/26/2022 1014        RADIOGRAPHIC STUDIES:  No results found.   ASSESSMENT/PLAN:  Jeanette [name] 88 y.o. female with medical history significant for iron deficiency anemia of unclear etiology who presents for a follow up visit.    # Iron Deficiency Anemia 2/2 Unclear Etiology  # Anemia of Chronic Disease/Inflammation  -- Findings are consistent with anemia of chronic disease with a likely component of iron deficiency anemia secondary to an unclear etiology --Patient had markedly elevated inflammatory markers previously and has known history of rheumatoid arthritis. -- Patient unable to tolerate p.o. iron therapy due to diarrhea. -- Patient is status post 2 doses of IV Feraheme in February 2023 --Labs today show white blood cell count ***, hemoglobin ***, MCV ***, and platelets of *** Iron panel shows serum iron  ***, iron sat ***%, TIBC ***, ferritin levels pending.   --Monitor for now and no indication for IV iron at this time***.  --RTC in 6 months time to reassesses.       No orders of the defined types were placed in this encounter.    I spent {CHL ONC TIME VISIT - ZDGUY:[id number]} counseling the patient face to face. The total time spent in the appointment was {CHL ONC TIME VISIT - GLOVF:[id number]}.  Jeanette [name] L Conlee Sliter, PA-C 05/15/23

## 2023-05-17 ENCOUNTER — Inpatient Hospital Stay: Payer: Medicare Other | Attending: Hematology and Oncology

## 2023-05-17 ENCOUNTER — Ambulatory Visit: Payer: Medicare Other | Admitting: Hematology and Oncology

## 2023-05-17 ENCOUNTER — Other Ambulatory Visit: Payer: Medicare Other

## 2023-05-17 ENCOUNTER — Ambulatory Visit: Payer: Medicare Other | Admitting: Physician Assistant

## 2023-06-05 NOTE — Progress Notes (Unsigned)
[facility] [name] [facility] OFFICE PROGRESS NOTE  Rodrigo Ran, MD [address] [place] [address]  DIAGNOSIS:  # Iron Deficiency Anemia 2/2 Unclear Etiology  # Anemia of Chronic Disease/Inflammation  08/04/2021: WBC 7.05, Hgb 8.7, MCV 82.7, Plt 407. Ferritin 149, Iron sat 9%, TIBC 203, Iron 19.  Cr 0.7 08/11/2021: establish care with Dr. Leonides Schanz 08/18/2021-08/25/2021: Patient received IV Feraheme 510 mg x 2 doses 01/22/2022: White blood cell count 4.9, hemoglobin 9.5, MCV 100.7, platelets of 284. Iron sat of 14% with ferritin of 199 04/26/2022: WBC 4.3, Hgb 10.3, MCV 98.2, Plt 243, Iron 49, saturation 21% 10/26/2022: WBC 4.8, Hgb 11.7, MCV 100.3, Plt 206, Iron 83, saturation 31%  INTERVAL HISTORY: Jeanette [name] 88 y.o. [name] returns to the clinic today for a follow up visit. The patient was last seen by my colleague, Jeanette [name].  The patient is followed for her history of iron deficiency anemia of unclear etiology.  She was last seen in the clinic on 10/26/2022.    The patient is presently ***taking iron supplements, unable to tolerate iron due to diarrhea?  Prescription or over-the-counter?  She has not required any IV iron since August 24, 2021 for which she received Feraheme 510 mg.  Overall, the patient is feeling ***today.  Her fatigue is ***.  He denies any abnormal bleeding or bruising including epistaxis, gingival bleeding, hemoptysis, hematemesis, melena, or hematochezia.  Shortness of breath?  Struggles with UTI. Lightheadedness? She is here for evaluation and repeat blood work.    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  MEDICATIONS:  Current Outpatient Medications  Medication Sig Dispense Refill   acetaminophen (TYLENOL) 650 MG CR tablet Take 650 mg by mouth 2 (two) times daily.     apixaban (ELIQUIS) 5 MG TABS tablet Take 1 tablet (5 mg total) by mouth 2 (two) times daily. 60 tablet 1   escitalopram (LEXAPRO) 10 MG tablet Take 10 mg by mouth daily.     metoprolol (TOPROL-XL) 200 MG 24 hr tablet Take 100 mg by mouth 2 (two) times daily.     nitroGLYCERIN (NITROSTAT) 0.4 MG SL tablet PLACE 1 TABLET (0.4 MG TOTAL) UNDER THE TONGUE EVERY 5 (FIVE) MINUTES AS NEEDED FOR CHEST PAIN. 75 tablet 1   olmesartan (BENICAR) 20 MG tablet Take 1 tablet (20 mg total) by mouth daily. 30 tablet 1   polyethylene glycol (MIRALAX / GLYCOLAX) 17 g packet Take 17 g by mouth daily as needed for mild constipation. 14 each 0   REPATHA SURECLICK 140 MG/ML SOAJ Inject 140 mg into the skin See admin instructions. Take 140mg  SQ every two weeks per  patient     No current facility-administered medications for this visit.    SURGICAL HISTORY:  Past Surgical History:  Procedure Laterality Date   BREAST BIOPSY Left 01/14/2003   BREAST LUMPECTOMY Right 1989   CARDIOVASCULAR STRESS TEST  07/27/2003   No evidence if LV myocardial ischemia or scar. LV EF 74%.   GYNECOLOGIC CRYOSURGERY     IR CT HEAD LTD  12/04/2019   IR PERCUTANEOUS ART THROMBECTOMY/INFUSION INTRACRANIAL INC DIAG ANGIO  12/04/2019   IR US GUIDE VASC ACCESS LEFT  12/04/2019   OPEN REDUCTION INTERNAL FIXATION (ORIF)  DISTAL RADIAL FRACTURE Left 02/19/2017   Procedure: OPEN REDUCTION INTERNAL FIXATION (ORIF) LEFT DISTAL RADIAL FRACTURE;  Surgeon: Betha Loa, MD;  Location: [facility];  Service: Orthopedics;  Laterality: Left;   OVARIAN CYST REMOVAL     RADIOLOGY WITH ANESTHESIA N/A 12/04/2019   Procedure: IR WITH ANESTHESIA;  Surgeon: Julieanne Cotton, MD;  Location: [facility];  Service: Radiology;  Laterality: N/A;   RENAL DOPPLER  09/09/2012   Right proximal renal artery: 60-99% diameter reduction. Normal patency of the left main renal artery.    TRANSTHORACIC ECHOCARDIOGRAM  03/22/2006   EF 55-60%, mild mitral valvular regurg, pulmonary artery systolic pressure was moderately increased.   TUBAL LIGATION      REVIEW OF SYSTEMS:   Review of Systems  Constitutional: Negative for appetite change, chills, fatigue, fever and unexpected weight change.  HENT:   Negative for mouth sores, nosebleeds, sore throat and trouble swallowing.   Eyes: Negative for eye problems and icterus.  Respiratory: Negative for cough, hemoptysis, shortness of breath and wheezing.   Cardiovascular: Negative for chest pain and leg swelling.  Gastrointestinal: Negative for abdominal pain, constipation, diarrhea, nausea and vomiting.  Genitourinary: Negative for bladder incontinence, difficulty urinating, dysuria, frequency and hematuria.   Musculoskeletal: Negative for back pain, gait problem, neck pain and neck stiffness.  Skin: Negative for itching and rash.  Neurological: Negative for dizziness, extremity weakness, gait problem, headaches, light-headedness and seizures.  Hematological: Negative for adenopathy. Does not bruise/bleed easily.  Psychiatric/Behavioral: Negative for confusion, depression and sleep disturbance. The patient is not nervous/anxious.     PHYSICAL EXAMINATION:  There were no vitals taken for this visit.  ECOG PERFORMANCE STATUS: {CHL ONC ECOG [id number]  Physical Exam  Constitutional:  Oriented to person, place, and time and well-developed, well-nourished, and in no distress. No distress.  HENT:  Head: Normocephalic and atraumatic.  Mouth/Throat: Oropharynx is clear and moist. No oropharyngeal exudate.  Eyes: Conjunctivae are normal. Right eye exhibits no discharge. Left eye exhibits no discharge. No scleral icterus.  Neck: Normal range of motion. Neck supple.  Cardiovascular: Normal rate, regular rhythm, normal heart sounds and intact distal pulses.   Pulmonary/Chest: Effort normal and breath sounds normal. No respiratory distress. No wheezes. No rales.  Abdominal: Soft. Bowel sounds are normal. Exhibits no distension and no mass. There is no tenderness.  Musculoskeletal: Normal range of motion. Exhibits no edema.  Lymphadenopathy:    No cervical adenopathy.  Neurological: Alert and oriented to person, place, and time. Exhibits normal muscle tone. Gait normal. Coordination normal.  Skin: Skin is warm and dry. No rash noted. Not diaphoretic. No erythema. No pallor.  Psychiatric: Mood, memory and judgment normal.  Vitals reviewed.  LABORATORY DATA: Lab Results  Component Value Date   WBC 6.0 12/10/2022   HGB 9.9 (L) 12/10/2022   HCT 32.0 (L) 12/10/2022   MCV 100.6 (H) 12/10/2022   PLT 172 12/10/2022  Chemistry      Component Value Date/Time   NA 134 (L) 12/10/2022 1250   K 3.1 (L) 12/10/2022 1250   CL 100 12/10/2022 1250   CO2 27 12/10/2022 1250   BUN 9 12/10/2022 1250   CREATININE 0.69 12/10/2022 1250   CREATININE 0.71 10/26/2022 1014      Component Value Date/Time   CALCIUM 10.0 12/10/2022 1250   ALKPHOS 56 12/10/2022 1250   AST 12 (L) 12/10/2022 1250   AST 11 (L) 10/26/2022 1014   ALT 8 12/10/2022 1250   ALT 6 10/26/2022 1014   BILITOT 0.3 12/10/2022 1250   BILITOT 0.4 10/26/2022 1014       RADIOGRAPHIC STUDIES:  No results found.   ASSESSMENT/PLAN:  SWANZETTA STEPHEN 88 y.o. [name] with medical history significant for iron deficiency  anemia of unclear etiology who presents for a follow up visit.    # Iron Deficiency Anemia 2/2 Unclear Etiology  # Anemia of Chronic Disease/Inflammation  -- Findings are consistent with anemia of chronic disease with a likely component of iron deficiency anemia secondary to an unclear etiology --Patient had markedly elevated inflammatory markers previously and has known history of rheumatoid arthritis. -- Patient unable to tolerate p.o. iron therapy due to diarrhea. -- Patient is status post 2 doses of IV Feraheme in February 2023 --Labs today show white blood cell count ***, hemoglobin ***, MCV ***, and platelets of ***. Ion panel shows serum iron  ***, iron sat ***%, TIBC ***, ferritin levels pending.   --Monitor for now and no indication for IV iron at this time.  --RTC in 6 months time to reassesses.       No orders of the defined types were placed in this encounter.    I spent {CHL ONC TIME VISIT - ZOXWR:[id number]} counseling the patient face to face. The total time spent in the appointment was {CHL ONC TIME VISIT - BJYNW:[id number]}.  Jeanette Avalos L Slayde Brault, PA-C 06/05/23

## 2023-06-10 ENCOUNTER — Ambulatory Visit: Payer: Medicare Other | Admitting: Physician Assistant

## 2023-06-10 ENCOUNTER — Other Ambulatory Visit: Payer: Medicare Other | Attending: Hematology and Oncology

## 2023-06-10 ENCOUNTER — Inpatient Hospital Stay: Payer: Medicare Other

## 2023-07-01 ENCOUNTER — Telehealth: Payer: Self-pay | Admitting: Hematology and Oncology

## 2023-07-01 NOTE — Telephone Encounter (Signed)
Patient did not show for her 12/2 appointment.  Made multiple attempts to reach patient.  Unable to reach her and voicemail is not set up.

## 2023-10-12 ENCOUNTER — Emergency Department (HOSPITAL_BASED_OUTPATIENT_CLINIC_OR_DEPARTMENT_OTHER): Admitting: Radiology

## 2023-10-12 ENCOUNTER — Observation Stay (HOSPITAL_BASED_OUTPATIENT_CLINIC_OR_DEPARTMENT_OTHER): Admission: EM | Admit: 2023-10-12 | Discharge: 2023-10-14 | Disposition: A | Discharge status: Home / Self Care

## 2023-10-12 ENCOUNTER — Encounter (HOSPITAL_BASED_OUTPATIENT_CLINIC_OR_DEPARTMENT_OTHER): Payer: Self-pay

## 2023-10-12 DIAGNOSIS — Z87448 Personal history of other diseases of urinary system: Secondary | ICD-10-CM | POA: Insufficient documentation

## 2023-10-12 DIAGNOSIS — Z8673 Personal history of transient ischemic attack (TIA), and cerebral infarction without residual deficits: Secondary | ICD-10-CM | POA: Insufficient documentation

## 2023-10-12 DIAGNOSIS — F419 Anxiety disorder, unspecified: Secondary | ICD-10-CM | POA: Diagnosis not present

## 2023-10-12 DIAGNOSIS — I484 Atypical atrial flutter: Principal | ICD-10-CM

## 2023-10-12 DIAGNOSIS — I4891 Unspecified atrial fibrillation: Secondary | ICD-10-CM | POA: Insufficient documentation

## 2023-10-12 DIAGNOSIS — N390 Urinary tract infection, site not specified: Secondary | ICD-10-CM | POA: Diagnosis not present

## 2023-10-12 DIAGNOSIS — R0602 Shortness of breath: Secondary | ICD-10-CM | POA: Diagnosis present

## 2023-10-12 DIAGNOSIS — F32A Depression, unspecified: Secondary | ICD-10-CM | POA: Diagnosis not present

## 2023-10-12 DIAGNOSIS — I951 Orthostatic hypotension: Secondary | ICD-10-CM | POA: Insufficient documentation

## 2023-10-12 DIAGNOSIS — F1090 Alcohol use, unspecified, uncomplicated: Secondary | ICD-10-CM | POA: Diagnosis not present

## 2023-10-12 DIAGNOSIS — I10 Essential (primary) hypertension: Secondary | ICD-10-CM | POA: Insufficient documentation

## 2023-10-12 DIAGNOSIS — Z79899 Other long term (current) drug therapy: Secondary | ICD-10-CM | POA: Diagnosis not present

## 2023-10-12 DIAGNOSIS — I739 Peripheral vascular disease, unspecified: Secondary | ICD-10-CM | POA: Diagnosis not present

## 2023-10-12 DIAGNOSIS — R002 Palpitations: Secondary | ICD-10-CM | POA: Diagnosis present

## 2023-10-12 DIAGNOSIS — E785 Hyperlipidemia, unspecified: Secondary | ICD-10-CM | POA: Insufficient documentation

## 2023-10-12 DIAGNOSIS — I4892 Unspecified atrial flutter: Secondary | ICD-10-CM | POA: Diagnosis not present

## 2023-10-12 DIAGNOSIS — Z85048 Personal history of other malignant neoplasm of rectum, rectosigmoid junction, and anus: Secondary | ICD-10-CM | POA: Diagnosis not present

## 2023-10-12 DIAGNOSIS — R000 Tachycardia, unspecified: Secondary | ICD-10-CM | POA: Diagnosis not present

## 2023-10-12 LAB — URINALYSIS, W/ REFLEX TO CULTURE (INFECTION SUSPECTED)
Bacteria, UA: NONE SEEN
Bilirubin Urine: NEGATIVE
Glucose, UA: NEGATIVE mg/dL
Nitrite: NEGATIVE
Protein, ur: 30 mg/dL — AB
Specific Gravity, Urine: 1.026 (ref 1.005–1.030)
pH: 5.5 (ref 5.0–8.0)

## 2023-10-12 LAB — CBC WITH DIFFERENTIAL/PLATELET
Abs Immature Granulocytes: 0.04 10*3/uL (ref 0.00–0.07)
Basophils Absolute: 0 10*3/uL (ref 0.0–0.1)
Basophils Relative: 0 %
Eosinophils Absolute: 0.1 10*3/uL (ref 0.0–0.5)
Eosinophils Relative: 1 %
HCT: 41.4 % (ref 36.0–46.0)
Hemoglobin: 13.2 g/dL (ref 12.0–15.0)
Immature Granulocytes: 1 %
Lymphocytes Relative: 21 %
Lymphs Abs: 1.2 10*3/uL (ref 0.7–4.0)
MCH: 31.3 pg (ref 26.0–34.0)
MCHC: 31.9 g/dL (ref 30.0–36.0)
MCV: 98.1 fL (ref 80.0–100.0)
Monocytes Absolute: 0.9 10*3/uL (ref 0.1–1.0)
Monocytes Relative: 16 %
Neutro Abs: 3.6 10*3/uL (ref 1.7–7.7)
Neutrophils Relative %: 61 %
Platelets: 211 10*3/uL (ref 150–400)
RBC: 4.22 MIL/uL (ref 3.87–5.11)
RDW: 13.5 % (ref 11.5–15.5)
WBC: 5.8 10*3/uL (ref 4.0–10.5)
nRBC: 0 % (ref 0.0–0.2)

## 2023-10-12 LAB — COMPREHENSIVE METABOLIC PANEL WITH GFR
ALT: 22 U/L (ref 0–44)
AST: 18 U/L (ref 15–41)
Albumin: 4.1 g/dL (ref 3.5–5.0)
Alkaline Phosphatase: 59 U/L (ref 38–126)
Anion gap: 5 (ref 5–15)
BUN: 11 mg/dL (ref 8–23)
CO2: 31 mmol/L (ref 22–32)
Calcium: 11.3 mg/dL — ABNORMAL HIGH (ref 8.9–10.3)
Chloride: 104 mmol/L (ref 98–111)
Creatinine, Ser: 0.87 mg/dL (ref 0.44–1.00)
GFR, Estimated: >60 mL/min (ref >60)
Glucose, Bld: 98 mg/dL (ref 70–99)
Potassium: 4 mmol/L (ref 3.5–5.1)
Sodium: 140 mmol/L (ref 135–145)
Total Bilirubin: 0.5 mg/dL (ref 0.0–1.2)
Total Protein: 6.9 g/dL (ref 6.5–8.1)

## 2023-10-12 LAB — TSH: TSH: 2.084 u[IU]/mL (ref 0.350–4.500)

## 2023-10-12 LAB — PROTIME-INR
INR: 1.3 — ABNORMAL HIGH (ref 0.8–1.2)
Prothrombin Time: 16 s — ABNORMAL HIGH (ref 11.4–15.2)

## 2023-10-12 LAB — LACTIC ACID, PLASMA
Lactic Acid, Venous: 2 mmol/L (ref 0.5–1.9)
Lactic Acid, Venous: 2.9 mmol/L (ref 0.5–1.9)

## 2023-10-12 LAB — BRAIN NATRIURETIC PEPTIDE: B Natriuretic Peptide: 264.2 pg/mL — ABNORMAL HIGH (ref 0.0–100.0)

## 2023-10-12 MED ORDER — METOPROLOL SUCCINATE ER 100 MG PO TB24
100.0000 mg | ORAL_TABLET | Freq: Two times a day (BID) | ORAL | Status: DC
  Administered 2023-10-12 – 2023-10-14 (×4): 100 mg via ORAL
  Filled 2023-10-12 (×4): qty 1

## 2023-10-12 MED ORDER — DIGOXIN 0.25 MG/ML IJ SOLN
0.5000 mg | Freq: Once | INTRAMUSCULAR | Status: AC
  Administered 2023-10-12: 0.5 mg via INTRAVENOUS
  Filled 2023-10-12: qty 2

## 2023-10-12 MED ORDER — SODIUM CHLORIDE 0.9 % IV SOLN
1.0000 g | INTRAVENOUS | Status: DC
  Administered 2023-10-13 – 2023-10-14 (×2): 1 g via INTRAVENOUS
  Filled 2023-10-12 (×2): qty 10

## 2023-10-12 MED ORDER — ALBUTEROL SULFATE (2.5 MG/3ML) 0.083% IN NEBU: 2.5000 mg | INHALATION_SOLUTION | Freq: Four times a day (QID) | RESPIRATORY_TRACT | Status: DC | PRN

## 2023-10-12 MED ORDER — SODIUM CHLORIDE 0.9 % IV SOLN
1.0000 g | Freq: Once | INTRAVENOUS | Status: AC
  Administered 2023-10-12: 1 g via INTRAVENOUS
  Filled 2023-10-12: qty 10

## 2023-10-12 MED ORDER — APIXABAN 2.5 MG PO TABS
2.5000 mg | ORAL_TABLET | Freq: Two times a day (BID) | ORAL | Status: DC
  Administered 2023-10-12 – 2023-10-14 (×4): 2.5 mg via ORAL
  Filled 2023-10-12 (×4): qty 1

## 2023-10-12 MED ORDER — BISACODYL 5 MG PO TBEC: 5.0000 mg | DELAYED_RELEASE_TABLET | Freq: Every day | ORAL | Status: DC | PRN

## 2023-10-12 MED ORDER — LACTATED RINGERS IV BOLUS
1000.0000 mL | Freq: Once | INTRAVENOUS | Status: AC
  Administered 2023-10-12: 1000 mL via INTRAVENOUS

## 2023-10-12 MED ORDER — POLYETHYLENE GLYCOL 3350 17 G PO PACK: 17.0000 g | PACK | Freq: Every day | ORAL | Status: DC | PRN

## 2023-10-12 MED ORDER — ACETAMINOPHEN 325 MG PO TABS
650.0000 mg | ORAL_TABLET | Freq: Four times a day (QID) | ORAL | Status: DC | PRN
  Administered 2023-10-12 – 2023-10-14 (×3): 650 mg via ORAL
  Filled 2023-10-12 (×3): qty 2

## 2023-10-12 MED ORDER — HYDRALAZINE HCL 20 MG/ML IJ SOLN: 10.0000 mg | Freq: Four times a day (QID) | INTRAMUSCULAR | Status: DC | PRN

## 2023-10-12 MED ORDER — ACETAMINOPHEN 650 MG RE SUPP: 650.0000 mg | Freq: Four times a day (QID) | RECTAL | Status: DC | PRN

## 2023-10-12 NOTE — ED Notes (Signed)
 CRITICAL VALUE STICKER  CRITICAL VALUE:Lactate 2.0  RECEIVER (on-site recipient of call):Carmon Ginsberg, RN  DATE & TIME NOTIFIED:   MESSENGER (representative from lab):  MD NOTIFIED: Tee  TIME OF NOTIFICATION:1255  RESPONSE:

## 2023-10-12 NOTE — Progress Notes (Signed)
   10/12/23 1815  Assess: MEWS Score  Temp 97.8 F (36.6 C)  BP (!) 132/109  MAP (mmHg) 118  Pulse Rate (!) 131  ECG Heart Rate (!) 133  Resp 17  Level of Consciousness Alert  SpO2 100 %  O2 Device Room Air  Assess: MEWS Score  MEWS Temp 0  MEWS Systolic 0  MEWS Pulse 3  MEWS RR 0  MEWS LOC 0  MEWS Score 3  MEWS Score Color Yellow  Assess: if the MEWS score is Yellow or Red  Were vital signs accurate and taken at a resting state? Yes  Does the patient meet 2 or more of the SIRS criteria? Yes  Does the patient have a confirmed or suspected source of infection? Yes  MEWS guidelines implemented  Yes, yellow  Treat  MEWS Interventions Considered administering scheduled or prn medications/treatments as ordered  Take Vital Signs  Increase Vital Sign Frequency  Yellow: Q2hr x1, continue Q4hrs until patient remains green for 12hrs  Escalate  MEWS: Escalate Yellow: Discuss with charge nurse and consider notifying provider and/or RRT  Notify: Charge Nurse/RN  Name of Charge Nurse/RN Notified Eliane Decree, RN  Provider Notification  Provider Name/Title Lorin Glass, MD  Date Provider Notified 10/12/23  Time Provider Notified 1815  Method of Notification Face-to-face  Notification Reason Other (Comment) (Admission VS)  Provider response At bedside  Date of Provider Response 10/12/23  Time of Provider Response 1815  Assess: SIRS CRITERIA  SIRS Temperature  0  SIRS Respirations  0  SIRS Pulse 1  SIRS WBC 0  SIRS Score Sum  1

## 2023-10-12 NOTE — ED Provider Notes (Signed)
 [place] EMERGENCY DEPARTMENT AT [facility] Provider Note   CSN: [id number] Arrival date & time: 10/12/23  1119     History  Chief Complaint  Patient presents with   Palpitations    Jeanette [name] is a 89 y.o. female.  89 year old female with past medical history of atrial fibrillation on Eliquis as well as hypertension presenting to the emergency department today with palpitations.  The patient states she started developing palpitations last night for she went to bed.  She states that she is on Eliquis but this was reduced to a lower dose because she had some "bleeding in her eyes".  She states that she was having some urinary symptoms and is on some antibiotics for this.  She states that the symptoms have improved.  She denies any fevers that she knows of.  Denies any nausea, vomiting, diarrhea, or blood in stool.   Palpitations      Home Medications Prior to Admission medications   Medication Sig Start Date End Date Taking? Authorizing Provider  acetaminophen (TYLENOL) 650 MG CR tablet Take 650 mg by mouth 2 (two) times daily.    [provider]  apixaban (ELIQUIS) 5 MG TABS tablet Take 1 tablet (5 mg total) by mouth 2 (two) times daily. 12/06/19   Rinehuls, David L, PA-C  escitalopram (LEXAPRO) 10 MG tablet Take 10 mg by mouth daily.    [provider]  metoprolol (TOPROL-XL) 200 MG 24 hr tablet Take 100 mg by mouth 2 (two) times daily.    [provider]  nitroGLYCERIN (NITROSTAT) 0.4 MG SL tablet PLACE 1 TABLET (0.4 MG TOTAL) UNDER THE TONGUE EVERY 5 (FIVE) MINUTES AS NEEDED FOR CHEST PAIN. 07/13/19 08/23/22  Patwardhan, Anabel Bene, MD  olmesartan (BENICAR) 20 MG tablet Take 1 tablet (20 mg total) by mouth daily. 11/22/22   Kathlen Mody, MD  polyethylene glycol (MIRALAX / GLYCOLAX) 17 g packet Take 17 g by mouth daily as needed for mild constipation. 11/22/22   Kathlen Mody, MD  REPATHA SURECLICK 140 MG/ML SOAJ Inject 140 mg into the skin See  admin instructions. Take 140mg  SQ every two weeks per patient 10/12/19   [provider]      Allergies    Epinephrine, Cozaar [losartan], Crestor [rosuvastatin], Ezetimibe, Galantamine hydrobromide, Iron, Pregabalin, Septra [sulfamethoxazole-trimethoprim], Vytorin [ezetimibe-simvastatin], Benazepril hcl, Crestor [rosuvastatin calcium], and Lisinopril    Review of Systems   Review of Systems  Cardiovascular:  Positive for palpitations.  All other systems reviewed and are negative.   Physical Exam Updated Vital Signs BP 133/84   Pulse (!) 135   Temp (!) 97.4 F (36.3 C) (Oral)   Resp (!) 28   SpO2 99%  Physical Exam Vitals and nursing note reviewed.   Gen: NAD Eyes: PERRL, EOMI HEENT: no oropharyngeal swelling Neck: trachea midline Resp: clear to auscultation bilaterally Card: Tachycardic, no murmurs, rubs, or gallops Abd: nontender, nondistended Extremities: no calf tenderness, no edema Vascular: 2+ radial pulses bilaterally, 2+ DP pulses bilaterally Skin: no rashes Psyc: acting appropriately   ED Results / Procedures / Treatments   Labs (all labs ordered are listed, but only abnormal results are displayed) Labs Reviewed  BRAIN NATRIURETIC PEPTIDE - Abnormal; Notable for the following components:      Result Value   B Natriuretic Peptide 264.2 (*)    All other components within normal limits  LACTIC ACID, PLASMA - Abnormal; Notable for the following components:   Lactic Acid, Venous 2.0 (*)  All other components within normal limits  LACTIC ACID, PLASMA - Abnormal; Notable for the following components:   Lactic Acid, Venous 2.9 (*)    All other components within normal limits  COMPREHENSIVE METABOLIC PANEL WITH GFR - Abnormal; Notable for the following components:   Calcium 11.3 (*)    All other components within normal limits  PROTIME-INR - Abnormal; Notable for the following components:   Prothrombin Time 16.0 (*)    INR 1.3 (*)    All other  components within normal limits  URINALYSIS, W/ REFLEX TO CULTURE (INFECTION SUSPECTED) - Abnormal; Notable for the following components:   APPearance HAZY (*)    Hgb urine dipstick TRACE (*)    Ketones, ur TRACE (*)    Protein, ur 30 (*)    Leukocytes,Ua TRACE (*)    All other components within normal limits  CULTURE, BLOOD (ROUTINE X 2)  CULTURE, BLOOD (ROUTINE X 2)  CBC WITH DIFFERENTIAL/PLATELET  TSH    EKG EKG Interpretation Date/Time:  Saturday October 12 2023 13:36:59 EDT Ventricular Rate:  135 PR Interval:  115 QRS Duration:  99 QT Interval:  300 QTC Calculation: 450 R Axis:   49  Text Interpretation: Atrial flutter with 2 to 1 block Repolarization abnormality, prob rate related Confirmed by Beckey Downing [id number]) on 10/12/2023 2:15:22 PM  Radiology DG Chest 2 View Result Date: 10/12/2023 CLINICAL DATA:  SOb EXAM: CHEST - 2 VIEW COMPARISON:  Chest x-ray 11/19/2022 FINDINGS: The heart and mediastinal contours are unchanged. Atherosclerotic plaque. No focal consolidation. No pulmonary edema. No pleural effusion. No pneumothorax. No acute osseous abnormality. IMPRESSION: 1. No active cardiopulmonary disease. 2.  Aortic Atherosclerosis (ICD10-I70.0). Electronically Signed   By: Tish Frederickson M.D.   On: 10/12/2023 13:07    Procedures Procedures    Medications Ordered in ED Medications  cefTRIAXone (ROCEPHIN) 1 g in sodium chloride 0.9 % 100 mL IVPB (has no administration in time range)  lactated ringers bolus 1,000 mL (1,000 mLs Intravenous New Bag/Given 10/12/23 1225)  digoxin (LANOXIN) 0.25 MG/ML injection 0.5 mg (0.5 mg Intravenous Given 10/12/23 1503)    ED Course/ Medical Decision Making/ A&P                                 Medical Decision Making 89 year old female with past medical history of atrial fibrillation on Eliquis as well as hypertension presents emergency department today with palpitations.  Patient is tachycardic here on arrival.  Will further evaluate her  here with a sepsis workup to look for possible causes for her tachycardia.  Her heart rate is not varying much on the monitor.  Will give IV fluids to see if this helps with her heart rate.  The patient's labs are largely unrevealing.  Her heart rate remained in the 130s.  A repeat EKG is performed and this does appear to be more consistent with atrial flutter.  I did call and discussed this with Dr. Tenny Craw.  She does recommend given the patient digoxin.  If the patient's urinalysis is negative she will admit the patient to the cardiology service.  If this is positive she recommends admission to the medicine service and she will consult.  I did the discussed the patient's case with the hospitalist service.  The patient be admitted.  Her urinalysis is somewhat equivocal for UTI but given her symptoms as well as rising lactic acid level I will treat her with Rocephin.  Her lactate is still less than 4 and she is otherwise hemodynamically stable in regards to her blood pressure.  Will hold off on additional IV fluids at this time to prevent volume overload given her age.  CRITICAL CARE Performed by: Durwin Glaze   Total critical care time: 35 minutes  Critical care time was exclusive of separately billable procedures and treating other patients.  Critical care was necessary to treat or prevent imminent or life-threatening deterioration.  Critical care was time spent personally by me on the following activities: development of treatment plan with patient and/or surrogate as well as nursing, discussions with consultants, evaluation of patient's response to treatment, examination of patient, obtaining history from patient or surrogate, ordering and performing treatments and interventions, ordering and review of laboratory studies, ordering and review of radiographic studies, pulse oximetry and re-evaluation of patient's condition.   Amount and/or Complexity of Data Reviewed Labs: ordered. Radiology:  ordered.  Risk Prescription drug management. Decision regarding hospitalization.           Final Clinical Impression(s) / ED Diagnoses Final diagnoses:  Atypical atrial flutter [facility])    Rx / DC Orders ED Discharge Orders     None         Durwin Glaze, MD 10/12/23 1526

## 2023-10-12 NOTE — ED Notes (Signed)
 Called [facility] for transport, pt bed assignment is ready

## 2023-10-12 NOTE — Plan of Care (Signed)
  Problem: Clinical Measurements: Goal: Respiratory complications will improve Outcome: Progressing Goal: Cardiovascular complication will be avoided Outcome: Progressing   Problem: Pain Managment: Goal: General experience of comfort will improve and/or be controlled Outcome: Progressing

## 2023-10-12 NOTE — ED Triage Notes (Signed)
 She feels that she "have gone into a fib again--I've been in it 6 times now". She states she has felt her pulse to be a bit rapid and irregular since last evening. She tells me she is anticoagulated and has no pain. She does, however c/o mild shortness of breath at times. Her skin is normal, warm and dry.

## 2023-10-12 NOTE — Consult Note (Signed)
 Cardiology Consultation   Patient ID: Jeanette [name] MRN: [id number]; DOB: Apr 15, 1935  Admit date: 10/12/2023 Date of Consult: 10/12/2023  PCP:  Rodrigo Ran, MD   [facility] Providers Cardiologist:  None  Electrophysiologist:  Maurice Small, MD       Patient Profile:    Jeanette [name] is a 89 y.o. female with a hx of paroxysmal AF who is being seen 10/12/2023 for the evaluation of UTI and atrial flutter at the request of [facility].   History of Present Illness:   Jeanette [name] presented with palpitations overnight with mild SOB and was tachycardic with an ECG showing atrial flutter with rapid ventricular response.  Lactate elevated to 2.9 which had uptrended from 2.0.  UA hazy with trace leukocytes.  Cr 0.9 BNP 269. Got a dose of IV ceftriaxone and 1L fluid.  Prior frequent UTIs  ECG from 11:30AM showed Aflutter with rates in the 140s, persistent and unchanged on subsequent ECG at 1:30pm, got a dose of IV digoxin at 3pm.  BP 100-130s systolic.  Had previously been on multaq but discontinued due to nausea.  Patient had been wary of amiodarone so while prescribed in the past, never used.  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  Also has a history of orthostatic hypotension which forces her to move slowly. Last echo in 2023 with normal ejection fraction and no major valvular disease.  Prior 14-day tele showed occasional atrial tachycardia with 36% AF/flutter burden, no high grade AV block.  She is on apixaban 2.5mg  bid which is appropriate dose reduction given her age and body weight and reports adherence.  Denies chest discomfort, fluid weight gain or edema. No recent falls.  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 LV EF 74%.   GYNECOLOGIC CRYOSURGERY     IR CT HEAD LTD  12/04/2019   IR PERCUTANEOUS ART THROMBECTOMY/INFUSION INTRACRANIAL INC DIAG ANGIO  12/04/2019   IR US GUIDE VASC ACCESS LEFT  12/04/2019   OPEN REDUCTION INTERNAL FIXATION (ORIF) DISTAL RADIAL FRACTURE Left 02/19/2017   Procedure: OPEN REDUCTION INTERNAL FIXATION (ORIF) LEFT DISTAL RADIAL FRACTURE;  Surgeon: Betha Loa, MD;  Location: [facility];  Service: Orthopedics;  Laterality: Left;   OVARIAN CYST REMOVAL     RADIOLOGY WITH ANESTHESIA N/A 12/04/2019   Procedure: IR WITH ANESTHESIA;  Surgeon: Julieanne Cotton, MD;  Location: [facility] OR;  Service: Radiology;  Laterality: N/A;   RENAL DOPPLER  09/09/2012   Right proximal renal artery: 60-99% diameter reduction. Normal patency of the left main renal artery.    TRANSTHORACIC ECHOCARDIOGRAM  03/22/2006   EF 55-60%, mild mitral valvular regurg, pulmonary artery systolic pressure was moderately increased.   TUBAL LIGATION         Inpatient Medications: Scheduled Meds:  Continuous Infusions:  PRN Meds: acetaminophen **OR** acetaminophen, albuterol, bisacodyl, hydrALAZINE, polyethylene glycol  Allergies:    Allergies  Allergen Reactions   Epinephrine Anaphylaxis    Shortness of breath and increased heart rate   Cozaar [Losartan] Other (See Comments)     increased air hunger and dizziness.   Crestor [Rosuvastatin] Other (See Comments)    memory problems.   Ezetimibe Other (See Comments)   Galantamine Hydrobromide Other (See Comments)    wt loss and dizziness   Iron Diarrhea   Pregabalin Other (See Comments)   Septra [Sulfamethoxazole-Trimethoprim] Other (See Comments)    2017 had a strange reaction she says.   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  Tobacco Use   Smoking status: Never   Smokeless tobacco: Never  Vaping Use   Vaping status: Never Used  Substance and Sexual Activity   Alcohol use: Yes    Alcohol/week: 2.0 standard drinks of alcohol    Types: 2 Glasses of wine per week    Comment: rare   Drug use: No   Sexual activity: Yes    Partners: Male    Birth control/protection: Post-menopausal  Other Topics Concern   Not on file  Social History Narrative   Lives with spouse   Social Drivers of Health   Financial Resource Strain: Not on file  Food Insecurity: Low Risk  (10/03/2023)   Received from [facility]   Hunger Vital Sign    Worried About Running Out of Food in the Last Year: Never true    Ran Out of Food in the Last Year: Never true  Transportation Needs: No Transportation Needs (10/03/2023)   Received from [facility]    In the past 12 months, has lack of reliable transportation kept you from medical appointments, meetings, work or from getting things needed for daily living? : No  Physical Activity: Not on file  Stress: Not on file  Social Connections: Not on file   Intimate Partner Violence: Not on file    Family History:    Family History  Problem Relation Age of Onset   Alzheimer's disease Mother    Pneumonia Mother    Heart failure Father    Heart disease Sister    Heart disease Brother    Heart disease Brother    Heart disease Sister    Pneumonia Sister      ROS:  Please see the history of present illness.   All other ROS reviewed and negative.     Physical Exam/Data:   Vitals:   10/12/23 1230 10/12/23 1430 10/12/23 1500 10/12/23 1540  BP: 104/67 115/67 133/84   Pulse: (!) 136 (!)  135    Resp: 16 15 (!) 28   Temp:    97.9 F (36.6 C)  TempSrc:    Oral  SpO2: 93% 99%     No intake or output data in the 24 hours ending 10/12/23 1754    11/19/2022   11:05 PM 11/19/2022   11:18 AM 10/26/2022   10:34 AM  Last 3 Weights  Weight (lbs) 116 lb 13.5 oz 130 lb 126 lb  Weight (kg) 53 kg 58.968 kg 57.153 kg     There is no height or weight on file to calculate BMI.  General:  Well nourished, well developed, in no acute distress HEENT: normal Neck: no JVD Vascular: No carotid bruits; Distal pulses 2+ bilaterally Cardiac:  tachy S1, S2; RRR; no murmur  Lungs:  clear to auscultation bilaterally, no wheezing, rhonchi or rales  Abd: soft, nontender, no hepatomegaly  Ext: no edema Musculoskeletal:  No deformities, BUE and BLE strength normal and equal Skin: warm and dry  Neuro:  CNs 2-12 intact, no focal abnormalities noted Psych:  Normal affect    Laboratory Data:  High Sensitivity Troponin:  No results for input(s): "TROPONINIHS" in the last 720 hours.   Chemistry Recent Labs  Lab 10/12/23 1157  NA 140  K 4.0  CL 104  CO2 31  GLUCOSE 98  BUN 11  CREATININE 0.87  CALCIUM 11.3*  GFRNONAA >60  ANIONGAP 5    Recent Labs  Lab 10/12/23 1157  PROT 6.9  ALBUMIN 4.1  AST 18  ALT 22  ALKPHOS 59  BILITOT 0.5   Lipids No results for input(s): "CHOL", "TRIG", "HDL", "LABVLDL", "LDLCALC", "CHOLHDL" in the last 168  hours.  Hematology Recent Labs  Lab 10/12/23 1157  WBC 5.8  RBC 4.22  HGB 13.2  HCT 41.4  MCV 98.1  MCH 31.3  MCHC 31.9  RDW 13.5  PLT 211   Thyroid  Recent Labs  Lab 10/12/23 1357  TSH 2.084    BNP Recent Labs  Lab 10/12/23 1157  BNP 264.2*    DDimer No results for input(s): "DDIMER" in the last 168 hours.   Radiology/Studies:  DG Chest 2 View Result Date: 10/12/2023 CLINICAL DATA:  SOb EXAM: CHEST - 2 VIEW COMPARISON:  Chest x-ray 11/19/2022 FINDINGS: The heart and mediastinal contours are unchanged. Atherosclerotic plaque. No focal consolidation. No pulmonary edema. No pleural effusion. No pneumothorax. No acute osseous abnormality. IMPRESSION: 1. No active cardiopulmonary disease. 2.  Aortic Atherosclerosis (ICD10-I70.0). Electronically Signed   By: Tish Frederickson M.D.   On: 10/12/2023 13:07     Assessment and Plan:   Atrial flutter with RVR.  Blood pressure ok but h/o severely symptomatic postural hypotension.  For tonight, I would not be terribly aggressive rate controlling her because in the past she's had a prolonged sinus pause after cardioversion.    She just got a dose of digoxin for today, and given her age would not reload her with another dose today.  Would not use IV BB or CCB unless her tachycardia is >160 bpm for at least 10 minutes.    In the light of day tomorrow, we can assess cardioversion options.  She has not been inclined to use amiodarone in the past, and while the risk of cardioversion on amiodarone isn't high, I think the first dose should be tested in the light of day if we go there.  With her UA results and lactate elevation, now is not the time to be rushing a  pacemaker into her either.  The cardiology team will follow-up in the morning to determine next steps. Continue the apixaban at 2.5 mg bid which is the appropriate dose for her.   Risk Assessment/Risk Scores:                For questions or updates, please contact [facility] Please consult [email or web address] for contact info under    Signed, Eyvonne Left, MD  10/12/2023 5:54 PM

## 2023-10-12 NOTE — H&P (Signed)
 [facility] History and Physical  Jeanette [name] ZOX:[id number] DOB: [birth year] DOA: 10/12/2023 PCP: Rodrigo Ran, MD  Presented from: Home Chief Complaint: Palpitation  History of Present Illness: Jeanette [name] is a 89 y.o. female with PMH significant for orthostatic hypotension, HLD, paroxysmal A-fib on Eliquis, stroke, PAD, renal artery stenosis, breast cancer, anal cancer anxiety/depression. Patient presented to ED at [facility] today with complaint of palpitation since last night before she went to bed.  Associated with mild shortness of breath.  She has a known history of A-fib and states she has gone into RVR several times in the past.  She is chronically anticoagulated with Eliquis but states that the dose was lowered because of bleeding in her eyes. Reports recent UTI for which she is on antibiotics by her urologist Dr. Earlene Plater at [facility]. Lives at home with husband.  Able to walk with a walker but does not get up much because of orthostatic hypotension.  When she has to get up, she is careful to maneuver slow.  In the ED, patient was afebrile, heart rate in 130s, blood pressure in 100s, breathing on room air Labs with unremarkable CBC, BMP Lactic acid 2 > 2.9 BNP 264 INR 1.8 Urinalysis showed hazy yellow urine with trace ketones, trace leukocytes, trace hemoglobin, no bacteria Blood culture sent Chest x-ray unremarkable EKG showed A flutter  with 2 is to 1 block. EDP discussed with cardiologist on-call Dr. Tenny Craw.  As recommended, patient was given digoxin. Patient was given 1 dose of IV Rocephin Admitted to [facility]  I evaluated the patient after she arrived to [facility] this afternoon. At the time of my evaluation, patient was lying down in bed.  Not in distress.  Tachycardic to 130s persistently.  Husband at bedside. No chest pain, shortness of breath.   Review of Systems:  All systems were reviewed and were negative unless otherwise mentioned in the HPI   Past medical  history: Past Medical History:  Diagnosis Date   A-fib (HCC) 03/15/2018   Acute encephalopathy 08/16/2022   Anal cancer (HCC)    Anxiety    Arthritis    Breast cancer (HCC)    Breast cancer (HCC)    Cancer of breast (HCC)    CEREBRAL EMBOLISM WITH CEREBRAL INFARCTION 03/21/2009   Qualifier: Diagnosis of   By: Denyse Amass CMA, Carol       Depression    Dysrhythmia 2010   a-fib   Hyperlipidemia    Hypertension    Ischemic stroke (HCC) 12/04/2019   PVD (peripheral vascular disease) (HCC)    RAS (renal artery stenosis) (HCC)    Stroke (HCC) 2019   no residual, x 2    Past surgical history: Past Surgical History:  Procedure Laterality Date   BREAST BIOPSY Left 01/14/2003   BREAST LUMPECTOMY Right 1989   CARDIOVASCULAR STRESS TEST  07/27/2003   No evidence if LV myocardial ischemia or scar. LV EF 74%.   GYNECOLOGIC CRYOSURGERY     IR CT HEAD LTD  12/04/2019   IR PERCUTANEOUS ART THROMBECTOMY/INFUSION INTRACRANIAL INC DIAG ANGIO  12/04/2019   IR US GUIDE VASC ACCESS LEFT  12/04/2019   OPEN REDUCTION INTERNAL FIXATION (ORIF) DISTAL RADIAL FRACTURE Left 02/19/2017   Procedure: OPEN REDUCTION INTERNAL FIXATION (ORIF) LEFT DISTAL RADIAL FRACTURE;  Surgeon: Betha Loa, MD;  Location: [facility];  Service: Orthopedics;  Laterality: Left;   OVARIAN CYST REMOVAL     RADIOLOGY WITH ANESTHESIA N/A 12/04/2019   Procedure: IR WITH ANESTHESIA;  Surgeon: Julieanne Cotton, MD;  Location[facility];  Service: Radiology;  Laterality: N/A;   RENAL DOPPLER  09/09/2012   Right proximal renal artery: 60-99% diameter reduction. Normal patency of the left main renal artery.    TRANSTHORACIC ECHOCARDIOGRAM  03/22/2006   EF 55-60%, mild mitral valvular regurg, pulmonary artery systolic pressure was moderately increased.   TUBAL LIGATION      Social History:  reports that she has never smoked. She has never used smokeless tobacco. She reports current alcohol use of about 2.0 standard drinks of  alcohol per week. She reports that she does not use drugs.  Allergies:  Allergies  Allergen Reactions   Epinephrine Anaphylaxis    Shortness of breath and increased heart rate   Cozaar [Losartan] Other (See Comments)    increased air hunger and dizziness.   Crestor [Rosuvastatin] Other (See Comments)    memory problems.   Ezetimibe Other (See Comments)   Galantamine Hydrobromide Other (See Comments)    wt loss and dizziness   Iron Diarrhea   Pregabalin Other (See Comments)   Septra [Sulfamethoxazole-Trimethoprim] Other (See Comments)    2017 had a strange reaction she says.   Vytorin [Ezetimibe-Simvastatin] Other (See Comments)    unknown   Benazepril Hcl Cough   Crestor [Rosuvastatin Calcium] Other (See Comments)    cognitive   Lisinopril Cough   Epinephrine, Cozaar [losartan], Crestor [rosuvastatin], Ezetimibe, Galantamine hydrobromide, Iron, Pregabalin, Septra [sulfamethoxazole-trimethoprim], Vytorin [ezetimibe-simvastatin], Benazepril hcl, Crestor [rosuvastatin calcium], and Lisinopril   Family history:  Family History  Problem Relation Age of Onset   Alzheimer's disease Mother    Pneumonia Mother    Heart failure Father    Heart disease Sister    Heart disease Brother    Heart disease Brother    Heart disease Sister    Pneumonia Sister      Physical Exam: Vitals:   10/12/23 1230 10/12/23 1430 10/12/23 1500 10/12/23 1540  BP: 104/67 115/67 133/84   Pulse: (!) 136 (!) 135    Resp: 16 15 (!) 28   Temp:    97.9 F (36.6 C)  TempSrc:    Oral  SpO2: 93% 99%     Wt Readings from Last 3 Encounters:  11/19/22 53 kg  10/26/22 57.2 kg  08/23/22 56.7 kg   There is no height or weight on file to calculate BMI.  General exam: Pleasant, elderly Caucasian female. Skin: No rashes, lesions or ulcers. HEENT: Atraumatic, normocephalic, no obvious bleeding Lungs: Clear to auscultation bilaterally,  CVS: Irregular tachycardia, no murmur GI/Abd: Soft, nontender,  nondistended, bowel sound present,   CNS: Alert, awake, oriented x 3 Psychiatry: Mood appropriate,  Extremities: No pedal edema, no calf tenderness,    ----------------------------------------------------------------------------------------------------------------------------------------- ----------------------------------------------------------------------------------------------------------------------------------------- -----------------------------------------------------------------------------------------------------------------------------------------  Assessment/Plan: Principal Problem:   Atrial flutter (HCC)  A flutter with RVR Patient history of paroxysmal A-fib, last seen by [facility] cardiology in February 2024.  Office note reviewed..  She used to be on Multaq which was switched to amiodarone because of nausea.  Patient states he did not take amiodarone because one of her family members had neuropathy due to amiodarone.  Currently seems to be on metoprolol. At one point, pacemaker placement was also discussed because while conversion from A-fib to normal sinus rhythm, patient was having long pauses as low as 6 seconds.  She however has not been able to get a pacemaker placed because of frequent UTI. Presented this time with palpitation, shortness of breath Not in CHF Cardiology consulted In the  ED, given digoxin per cardiology recommendation Continue metoprolol Chronically anticoagulated on Eliquis  Frequent UTI Reports recently completed a course of antibiotic prescribed by her urologist Dr. Connye Burkitt at [facility] Because of frequent UTIs he has not been able to get a pacemaker placed. Urinalysis today shows trace leukocytes, no bacteria. It seems patient was empirically started on IV Rocephin because of elevated lactic acid Since lactic acid level trended up, I would continue Rocephin for now. Repeat WBC count and lactic acid level in a.m. Recent Labs  Lab 10/12/23 1157  10/12/23 1357  WBC 5.8  --   LATICACIDVEN 2.0* 2.9*   Supine hypertension Orthostatic hypotension Per outpatient cardiology note, orthostatic hypotension with profound and hence supine hypertension was allowed. Supine blood pressure in normal range currently. Patient states she is able to walk with a walker but does not get up much because of orthostatic hypotension.  When she has to get up, she is careful to maneuver slow. Continue to monitor PTA meds- metoprolol, Benicar Continue metoprolol.  I will keep Benicar on hold for now  H/o stroke H/o renal artery stenosis PAD, HLD Continue Eliquis, Allergy to statin On Repatha as an outpatient  Anxiety/depression Lexapro  H/o breast cancer, anal cancer  Mobility: Ambulates with a walker.  Goals of care:   Code Status: Full Code    DVT prophylaxis:  apixaban (ELIQUIS) tablet 2.5 mg Start: 10/12/23 2200 apixaban (ELIQUIS) tablet 2.5 mg   Antimicrobials: IV Rocephin Fluid: None Consultants: Cardiology Family Communication: Husband at bedside  Status: Observation Level of care:  Telemetry Cardiac   Patient is from: Home Anticipated d/c to: Pending clinical course, likely home in 1 to 2 days  Diet: Diet Order             Diet Heart Room service appropriate? Yes; Fluid consistency: Thin  Diet effective now                    ------------------------------------------------------------------------------------- Severity of Illness: The appropriate patient status for this patient is OBSERVATION. Observation status is judged to be reasonable and necessary in order to provide the required intensity of service to ensure the patient's safety. The patient's presenting symptoms, physical exam findings, and initial radiographic and laboratory data in the context of their medical condition is felt to place them at decreased risk for further clinical deterioration. Furthermore, it is anticipated that the patient will be  medically stable for discharge from the hospital within 2 midnights of admission.  -------------------------------------------------------------------------------------   Home Meds: Prior to Admission medications   Medication Sig Start Date End Date Taking? Authorizing Provider  acetaminophen (TYLENOL) 650 MG CR tablet Take 650 mg by mouth 2 (two) times daily.    [provider]  apixaban (ELIQUIS) 5 MG TABS tablet Take 1 tablet (5 mg total) by mouth 2 (two) times daily. 12/06/19   Rinehuls, David L, PA-C  escitalopram (LEXAPRO) 10 MG tablet Take 10 mg by mouth daily.    [provider]  metoprolol (TOPROL-XL) 200 MG 24 hr tablet Take 100 mg by mouth 2 (two) times daily.    [provider]  nitroGLYCERIN (NITROSTAT) 0.4 MG SL tablet PLACE 1 TABLET (0.4 MG TOTAL) UNDER THE TONGUE EVERY 5 (FIVE) MINUTES AS NEEDED FOR CHEST PAIN. 07/13/19 08/23/22  Patwardhan, Anabel Bene, MD  olmesartan (BENICAR) 20 MG tablet Take 1 tablet (20 mg total) by mouth daily. 11/22/22   Kathlen Mody, MD  polyethylene glycol (MIRALAX / GLYCOLAX) 17 g packet Take 17  g by mouth daily as needed for mild constipation. 11/22/22   Kathlen Mody, MD  REPATHA SURECLICK 140 MG/ML SOAJ Inject 140 mg into the skin See admin instructions. Take 140mg  SQ every two weeks per patient 10/12/19   [provider]    Labs on Admission:   CBC: Recent Labs  Lab 10/12/23 1157  WBC 5.8  NEUTROABS 3.6  HGB 13.2  HCT 41.4  MCV 98.1  PLT 211    Basic Metabolic Panel: Recent Labs  Lab 10/12/23 1157  NA 140  K 4.0  CL 104  CO2 31  GLUCOSE 98  BUN 11  CREATININE 0.87  CALCIUM 11.3*    Liver Function Tests: Recent Labs  Lab 10/12/23 1157  AST 18  ALT 22  ALKPHOS 59  BILITOT 0.5  PROT 6.9  ALBUMIN 4.1   No results for input(s): "LIPASE", "AMYLASE" in the last 168 hours. No results for input(s): "AMMONIA" in the last 168 hours.  Cardiac Enzymes: No results for input(s): "CKTOTAL", "CKMB",  "CKMBINDEX", "TROPONINI" in the last 168 hours.  BNP (last 3 results) Recent Labs    10/12/23 1157  BNP 264.2*    ProBNP (last 3 results) No results for input(s): "PROBNP" in the last 8760 hours.  CBG: No results for input(s): "GLUCAP" in the last 168 hours.  Lipase  No results found for: "LIPASE"   Urinalysis    Component Value Date/Time   COLORURINE YELLOW 10/12/2023 1157   APPEARANCEUR HAZY (A) 10/12/2023 1157   LABSPEC 1.026 10/12/2023 1157   PHURINE 5.5 10/12/2023 1157   GLUCOSEU NEGATIVE 10/12/2023 1157   HGBUR TRACE (A) 10/12/2023 1157   BILIRUBINUR NEGATIVE 10/12/2023 1157   KETONESUR TRACE (A) 10/12/2023 1157   PROTEINUR 30 (A) 10/12/2023 1157   UROBILINOGEN 0.2 03/14/2009 1400   NITRITE NEGATIVE 10/12/2023 1157   LEUKOCYTESUR TRACE (A) 10/12/2023 1157     Drugs of Abuse     Component Value Date/Time   LABOPIA NONE DETECTED 11/19/2022 1240   COCAINSCRNUR NONE DETECTED 11/19/2022 1240   LABBENZ NONE DETECTED 11/19/2022 1240   AMPHETMU NONE DETECTED 11/19/2022 1240   THCU NONE DETECTED 11/19/2022 1240   LABBARB NONE DETECTED 11/19/2022 1240      Radiological Exams on Admission: DG Chest 2 View Result Date: 10/12/2023 CLINICAL DATA:  SOb EXAM: CHEST - 2 VIEW COMPARISON:  Chest x-ray 11/19/2022 FINDINGS: The heart and mediastinal contours are unchanged. Atherosclerotic plaque. No focal consolidation. No pulmonary edema. No pleural effusion. No pneumothorax. No acute osseous abnormality. IMPRESSION: 1. No active cardiopulmonary disease. 2.  Aortic Atherosclerosis (ICD10-I70.0). Electronically Signed   By: Tish Frederickson M.D.   On: 10/12/2023 13:07     Signed, Lorin Glass, MD [facility] 10/12/2023

## 2023-10-12 NOTE — ED Notes (Signed)
 Pt aware of need for urine sample. Does not need to void at this time.

## 2023-10-12 NOTE — Progress Notes (Signed)
 Blood culture x2 drawn before antibiotics.

## 2023-10-12 NOTE — ED Notes (Signed)
 Blood cultures x 2 drawn before blood cultures

## 2023-10-13 ENCOUNTER — Other Ambulatory Visit (HOSPITAL_COMMUNITY)

## 2023-10-13 ENCOUNTER — Other Ambulatory Visit: Payer: Self-pay

## 2023-10-13 DIAGNOSIS — I4891 Unspecified atrial fibrillation: Secondary | ICD-10-CM

## 2023-10-13 DIAGNOSIS — I4892 Unspecified atrial flutter: Secondary | ICD-10-CM | POA: Diagnosis not present

## 2023-10-13 LAB — BASIC METABOLIC PANEL WITH GFR
Anion gap: 7 (ref 5–15)
BUN: 9 mg/dL (ref 8–23)
CO2: 27 mmol/L (ref 22–32)
Calcium: 10.7 mg/dL — ABNORMAL HIGH (ref 8.9–10.3)
Chloride: 105 mmol/L (ref 98–111)
Creatinine, Ser: 0.71 mg/dL (ref 0.44–1.00)
GFR, Estimated: >60 mL/min (ref >60)
Glucose, Bld: 91 mg/dL (ref 70–99)
Potassium: 3.4 mmol/L — ABNORMAL LOW (ref 3.5–5.1)
Sodium: 139 mmol/L (ref 135–145)

## 2023-10-13 LAB — CBC
HCT: 34.9 % — ABNORMAL LOW (ref 36.0–46.0)
Hemoglobin: 11.4 g/dL — ABNORMAL LOW (ref 12.0–15.0)
MCH: 31.1 pg (ref 26.0–34.0)
MCHC: 32.7 g/dL (ref 30.0–36.0)
MCV: 95.1 fL (ref 80.0–100.0)
Platelets: 159 10*3/uL (ref 150–400)
RBC: 3.67 MIL/uL — ABNORMAL LOW (ref 3.87–5.11)
RDW: 13.3 % (ref 11.5–15.5)
WBC: 5.9 10*3/uL (ref 4.0–10.5)
nRBC: 0 % (ref 0.0–0.2)

## 2023-10-13 LAB — TSH: TSH: 5.168 u[IU]/mL — ABNORMAL HIGH (ref 0.350–4.500)

## 2023-10-13 LAB — LACTIC ACID, PLASMA: Lactic Acid, Venous: 1.2 mmol/L (ref 0.5–1.9)

## 2023-10-13 MED ORDER — DILTIAZEM HCL 30 MG PO TABS
30.0000 mg | ORAL_TABLET | Freq: Four times a day (QID) | ORAL | Status: DC
Start: 2023-10-13 — End: 2023-10-14
  Administered 2023-10-13 – 2023-10-14 (×4): 30 mg via ORAL
  Filled 2023-10-13 (×4): qty 1

## 2023-10-13 MED ORDER — POTASSIUM CHLORIDE CRYS ER 20 MEQ PO TBCR
40.0000 meq | EXTENDED_RELEASE_TABLET | Freq: Once | ORAL | Status: AC
Start: 2023-10-13 — End: 2023-10-13
  Administered 2023-10-13: 40 meq via ORAL
  Filled 2023-10-13: qty 2

## 2023-10-13 NOTE — Progress Notes (Signed)
 DAILY PROGRESS NOTE   Patient Name: Jeanette [name] Date of Encounter: 10/13/2023 Cardiologist: None  Chief Complaint   No complaints  Patient Profile   Jeanette [name] is a 89 y.o. female with a hx of paroxysmal AF who is being seen 10/12/2023 for the evaluation of UTI and atrial flutter at the request of [facility].   Subjective   Noted to have atrial flutter with RVR yesterday- has been on Eliquis. Concern for bradycardia in the past with pauses that suggested pacemaker, but this was never pursued, nor was amiodarone since she had a brother who developed neuropathy apparently from that.    Objective   Vitals:   10/12/23 2147 10/13/23 0035 10/13/23 0458 10/13/23 0747  BP: (!) 156/95 (!) 141/92 (!) 159/93 (!) 163/96  Pulse: (!) 133 91 92 96  Resp:  20 18 18   Temp:   97.7 F (36.5 C) 97.6 F (36.4 C)  TempSrc:  Oral Oral Oral  SpO2:  99% 98% 98%  Weight:      Height:        Intake/Output Summary (Last 24 hours) at 10/13/2023 1107 Last data filed at 10/12/2023 2145 Gross per 24 hour  Intake 480 ml  Output --  Net 480 ml   Filed Weights   10/12/23 1815  Weight: 56.9 kg    Physical Exam   General appearance: alert and no distress Lungs: clear to auscultation bilaterally Heart: irregularly irregular rhythm Extremities: extremities normal, atraumatic, no cyanosis or edema Neurologic: Grossly normal  Inpatient Medications    Scheduled Meds:  apixaban  2.5 mg Oral BID   metoprolol  100 mg Oral BID    Continuous Infusions:  cefTRIAXone (ROCEPHIN)  IV 1 g (10/13/23 0850)    PRN Meds: acetaminophen **OR** acetaminophen, albuterol, bisacodyl, hydrALAZINE, polyethylene glycol   Labs   Results for orders placed or performed during the hospital encounter of 10/12/23 (from the past 48 hours)  Blood Culture (routine x 2)     Status: None (Preliminary result)   Collection Time: 10/12/23 11:56 AM   Specimen: BLOOD RIGHT ARM  Result Value Ref Range   Specimen  Description      BLOOD RIGHT ARM Performed at [facility], [address] [place], [place] [address]    Special Requests      BOTTLES DRAWN AEROBIC AND ANAEROBIC Blood Culture adequate volume Performed at [facility], [address] [place], [place] [address]    Culture      NO GROWTH < 24 HOURS Performed at [facility], [address] [place], [place] [address]    Report Status PENDING   CBC with Differential     Status: None   Collection Time: 10/12/23 11:57 AM  Result Value Ref Range   WBC 5.8 4.0 - 10.5 K/uL   RBC 4.22 3.87 - 5.11 MIL/uL   Hemoglobin 13.2 12.0 - 15.0 g/dL   HCT 91.4 78.2 - 95.6 %   MCV 98.1 80.0 - 100.0 fL   MCH 31.3 26.0 - 34.0 pg   MCHC 31.9 30.0 - 36.0 g/dL   RDW 21.3 08.6 - 57.8 %   Platelets 211 150 - 400 K/uL   nRBC 0.0 0.0 - 0.2 %   Neutrophils Relative % 61 %   Neutro Abs 3.6 1.7 - 7.7 K/uL   Lymphocytes Relative 21 %   Lymphs Abs 1.2 0.7 - 4.0 K/uL   Monocytes Relative 16 %   Monocytes Absolute  0.9 0.1 - 1.0 K/uL   Eosinophils Relative 1 %   Eosinophils Absolute 0.1 0.0 - 0.5 K/uL   Basophils Relative 0 %   Basophils Absolute 0.0 0.0 - 0.1 K/uL   Immature Granulocytes 1 %   Abs Immature Granulocytes 0.04 0.00 - 0.07 K/uL    Comment: Performed at [facility], [address], [place], [place] [address]  Brain natriuretic peptide     Status: Abnormal   Collection Time: 10/12/23 11:57 AM  Result Value Ref Range   B Natriuretic Peptide 264.2 (H) 0.0 - 100.0 pg/mL    Comment: Performed at [facility], [address] [place], [place] [address]  Lactic acid, plasma     Status: Abnormal   Collection Time: 10/12/23 11:57 AM  Result Value Ref Range   Lactic Acid, Venous 2.0 (HH) 0.5 - 1.9 mmol/L    Comment: CRITICAL RESULT CALLED TO, READ BACK BY AND VERIFIED WITH: Carmon Ginsberg RN AT 1255 ON 10/12/23 BY A,MOHAMED Performed at [facility], [address] [place], [place], [place] [address]   Comprehensive metabolic panel     Status: Abnormal   Collection Time: 10/12/23 11:57 AM  Result Value Ref Range   Sodium 140 135 - 145 mmol/L   Potassium 4.0 3.5 - 5.1 mmol/L   Chloride 104 98 - 111 mmol/L   CO2 31 22 - 32 mmol/L   Glucose, Bld 98 70 - 99 mg/dL    Comment: Glucose reference range applies only to samples taken after fasting for at least 8 hours.   BUN 11 8 - 23 mg/dL   Creatinine, Ser 2.13 0.44 - 1.00 mg/dL   Calcium 08.6 (H) 8.9 - 10.3 mg/dL   Total Protein 6.9 6.5 - 8.1 g/dL   Albumin 4.1 3.5 - 5.0 g/dL   AST 18 15 - 41 U/L   ALT 22 0 - 44 U/L   Alkaline Phosphatase 59 38 - 126 U/L   Total Bilirubin 0.5 0.0 - 1.2 mg/dL   GFR, Estimated >57 >84 mL/min    Comment: (NOTE) Calculated using the CKD-EPI Creatinine Equation (2021)    Anion gap 5 5 - 15    Comment: Performed at [facility], [address] [place], [place] [address]  Protime-INR     Status: Abnormal   Collection Time: 10/12/23 11:57 AM  Result Value Ref Range   Prothrombin Time 16.0 (H) 11.4 - 15.2 seconds   INR 1.3 (H) 0.8 - 1.2    Comment: (NOTE) INR goal varies based on device and disease states. Performed at [facility], [address] [place], [place] [address]   Blood Culture (routine x 2)     Status: None (Preliminary result)   Collection Time: 10/12/23 11:57 AM   Specimen: BLOOD LEFT ARM  Result Value Ref Range   Specimen Description      BLOOD LEFT ARM Performed at [facility], [address] [place], [place] [address]    Special Requests      BOTTLES DRAWN AEROBIC AND ANAEROBIC Blood Culture adequate volume Performed at [facility], [address] [place], [place] [address]    Culture      NO GROWTH < 24 HOURS Performed at [facility], [address], [place], [place] [address]    Report Status PENDING   Urinalysis, w/ Reflex to Culture  (Infection Suspected) -Urine, Clean Catch     Status: Abnormal   Collection Time: 10/12/23 11:57 AM  Result Value Ref Range   Specimen Source URINE, CLEAN CATCH    Color, Urine YELLOW YELLOW   APPearance HAZY (A) CLEAR   Specific Gravity, Urine 1.026 1.005 - 1.030   pH 5.5 5.0 - 8.0   Glucose, UA NEGATIVE NEGATIVE mg/dL   Hgb urine dipstick TRACE (A) NEGATIVE   Bilirubin Urine NEGATIVE NEGATIVE   Ketones, ur TRACE (A) NEGATIVE mg/dL   Protein, ur 30 (A) NEGATIVE mg/dL   Nitrite NEGATIVE NEGATIVE   Leukocytes,Ua TRACE (A) NEGATIVE   RBC / HPF 0-5 0 - 5 RBC/hpf   WBC, UA 11-20 0 - 5 WBC/hpf    Comment:        Reflex urine culture not performed if WBC <=10, OR if Squamous epithelial cells >5. If Squamous epithelial cells >5 suggest recollection.    Bacteria, UA NONE SEEN NONE SEEN   Squamous Epithelial / HPF 6-10 0 - 5 /HPF   Mucus PRESENT    Ca Oxalate Crys, UA PRESENT     Comment: Performed at [facility], [address] [place], [place] [address]  Lactic acid, plasma     Status: Abnormal   Collection Time: 10/12/23  1:57 PM  Result Value Ref Range   Lactic Acid, Venous 2.9 (HH) 0.5 - 1.9 mmol/L    Comment: CRITICAL VALUE NOTED.  VALUE IS CONSISTENT WITH PREVIOUSLY REPORTED AND CALLED VALUE. Performed at [facility], [address] [place], [place] [address]   TSH     Status: None   Collection Time: 10/12/23  1:57 PM  Result Value Ref Range   TSH 2.084 0.350 - 4.500 uIU/mL    Comment: Performed by a 3rd Generation assay with a functional sensitivity of <=0.01 uIU/mL. Performed at [facility], [address] [place], [place] [address]   Basic metabolic panel     Status: Abnormal   Collection Time: 10/13/23  4:09 AM  Result Value Ref Range   Sodium 139 135 - 145 mmol/L   Potassium 3.4 (L) 3.5 - 5.1 mmol/L   Chloride 105 98 - 111 mmol/L   CO2 27 22 - 32 mmol/L   Glucose, Bld 91 70 - 99 mg/dL    Comment:  Glucose reference range applies only to samples taken after fasting for at least 8 hours.   BUN 9 8 - 23 mg/dL   Creatinine, Ser 4.69 0.44 - 1.00 mg/dL   Calcium 62.9 (H) 8.9 - 10.3 mg/dL   GFR, Estimated >52 >84 mL/min    Comment: (NOTE) Calculated using the CKD-EPI Creatinine Equation (2021)    Anion gap 7 5 - 15    Comment: Performed at [facility], [address] [place], [place] [address]  CBC     Status: Abnormal   Collection Time: 10/13/23  4:09 AM  Result Value Ref Range   WBC 5.9 4.0 - 10.5 K/uL   RBC 3.67 (L) 3.87 - 5.11 MIL/uL   Hemoglobin 11.4 (L) 12.0 - 15.0 g/dL   HCT 01.0 (L) 27.2 - 53.6 %   MCV 95.1 80.0 - 100.0 fL   MCH 31.1 26.0 - 34.0 pg   MCHC 32.7 30.0 - 36.0 g/dL   RDW 64.4 03.4 - 74.2 %   Platelets 159 150 - 400 K/uL   nRBC 0.0 0.0 - 0.2 %    Comment: Performed at [facility], [address], [place], [place] [address]  TSH     Status: Abnormal   Collection Time:  10/13/23  4:09 AM  Result Value Ref Range   TSH 5.168 (H) 0.350 - 4.500 uIU/mL    Comment: Performed by a 3rd Generation assay with a functional sensitivity of <=0.01 uIU/mL. Performed at [facility], [address] [place], [place] [address]   Lactic acid, plasma     Status: None   Collection Time: 10/13/23  4:09 AM  Result Value Ref Range   Lactic Acid, Venous 1.2 0.5 - 1.9 mmol/L    Comment: Performed at [facility], [address], [place], [place] [address]    ECG   N/A  Telemetry   Afib with RVR - Personally Reviewed  Radiology    DG Chest 2 View Result Date: 10/12/2023 CLINICAL DATA:  SOb EXAM: CHEST - 2 VIEW COMPARISON:  Chest x-ray 11/19/2022 FINDINGS: The heart and mediastinal contours are unchanged. Atherosclerotic plaque. No focal consolidation. No pulmonary edema. No pleural effusion. No pneumothorax. No acute osseous abnormality. IMPRESSION: 1. No active cardiopulmonary disease. 2.  Aortic Atherosclerosis (ICD10-I70.0). Electronically Signed    By: Tish Frederickson M.D.   On: 10/12/2023 13:07    Cardiac Studies   N/A  Assessment   Principal Problem:   Atrial flutter (HCC) Active Problems:   Atrial fibrillation with RVR (HCC)   Plan   Afib rates still poorly controlled. She is on toprol XL 100 mg BID. Will add low dose cardizem 30 mg q6hrs. Monitor for bradycardia. Echo pending -will follow-up on that.  Time Spent Directly with Patient:  I have spent a total of 25 minutes with the patient reviewing hospital notes, telemetry, EKGs, labs and examining the patient as well as establishing an assessment and plan that was discussed personally with the patient.  > 50% of time was spent in direct patient care.  Length of Stay:  LOS: 0 days   Chrystie Nose, MD, [facility], FACP  [place]  [facility]  Medical Director of the [facility] Lipid Disorders &  Cardiovascular Risk Reduction Clinic Diplomate of the American Board of Clinical Lipidology Attending Cardiologist  Direct Dial: [phone]  Fax: [phone]  Website:  [email or web address] Nicely Torianne Laflam 10/13/2023, 11:07 AM

## 2023-10-13 NOTE — Care Management Obs Status (Signed)
 MEDICARE OBSERVATION STATUS NOTIFICATION   Patient Details  Name: Jeanette [name] MRN: [id number] Date of Birth: 1934-10-14   Medicare Observation Status Notification Given:  Yes    Isaias Cowman, RN 10/13/2023, 3:58 PM

## 2023-10-13 NOTE — Progress Notes (Signed)
 PROGRESS NOTE  Jeanette [name]  DOB: 08/24/1934  PCP: Rodrigo Ran, MD WUJ:[id number]  DOA: 10/12/2023  LOS: 0 days  Hospital Day: 2  Brief narrative: AMAZIAH [name] is a 89 y.o. female with PMH significant for orthostatic hypotension, HLD, paroxysmal A-fib on Eliquis, stroke, PAD, renal artery stenosis, breast cancer, anal cancer anxiety/depression. Patient [facility] to [facility] with complaint of palpitation since last night before she went to bed.  Associated with mild shortness of breath.  She has a known history of A-fib and states she has gone into RVR several times in the past.  She is chronically anticoagulated with Eliquis but states that the dose was lowered because of bleeding in her eyes. Reports recent UTI for which she is on antibiotics by her urologist Dr. Earlene Plater at [facility]. Lives at home with husband.  Able to walk with a walker but does not get up much because of orthostatic hypotension.  When she has to get up, she is careful to maneuver slow.  In the ED, patient was afebrile, heart rate in 130s, blood pressure in 100s, breathing on room air Labs with unremarkable CBC, BMP Lactic acid 2 > 2.9 BNP 264 INR 1.8 Urinalysis showed hazy yellow urine with trace ketones, trace leukocytes, trace hemoglobin, no bacteria Blood culture sent Chest x-ray unremarkable EKG showed A flutter  with 2 is to 1 block. EDP discussed with cardiologist on-call Dr. Tenny Craw.  As recommended, patient was given digoxin. Patient was given 1 dose of IV Rocephin Admitted to [facility]  Subjective: Patient was seen and examined this morning.  Pleasant elderly Caucasian female.  Lying down in bed. Heart rate overnight was better at 90s but back again to 120s this morning. Seen later this morning by cardiology.  Started on digoxin oral.  Assessment/Plan: A flutter with RVR Patient history of paroxysmal A-fib, last seen by [facility] cardiology in February 2024.  Office note reviewed..  She used to  be on Multaq which was switched to amiodarone because of nausea.  Patient states he did not take amiodarone because one of her family members had neuropathy due to amiodarone.  Currently seems to be on metoprolol. At one point, pacemaker placement was also discussed because of posttermination prolonged pauses as long as 6 seconds.  She however was not been able to get a pacemaker placed because of frequent UTI. Presented this time with palpitation, shortness of breath Not in CHF Cardiology consulted Continueed on metoprolol from home.  Was given digoxin yesterday. Heart rate remains elevated still.  Noted plan from cardiology to start digoxin 30 mg every 6 hours today. Chronically anticoagulated on Eliquis  Frequent UTI Reports recently completed a course of antibiotic prescribed by her urologist Dr. Earlene Plater at [facility]. Because of frequent UTIs he has not been able to get a pacemaker placed. Urinalysis on admission shows trace leukocytes, no bacteria. It seems patient was empirically started on IV Rocephin because of elevated lactic acid. Currently continued IV Rocephin WBC remains normal.  Lactic acid level improved. Recent Labs  Lab 10/12/23 1157 10/12/23 1357 10/13/23 0409  WBC 5.8  --  5.9  LATICACIDVEN 2.0* 2.9* 1.2   Supine hypertension Orthostatic hypotension Per outpatient cardiology note, orthostatic hypotension with profound and hence supine hypertension was allowed. Supine blood pressure in normal range currently. Patient states she is able to walk with a walker but does not get up much because of orthostatic hypotension.  When she has to get up, she is careful to maneuver  slow. Continue to monitor PTA meds- metoprolol, Benicar Currently continued on metoprolol.  Also added Cardizem today.  Benicar remains on hold. Recent Labs    10/26/22 1014 11/19/22 1140 11/19/22 1146 11/20/22 0054 11/21/22 0822 11/22/22 0444 12/10/22 1250 10/12/23 1157 10/13/23 0409  BUN 11 8 9   6* 6* 6* 9 11 9   CREATININE 0.71 0.70 0.50 0.61 0.64 0.62 0.69 0.87 0.71  CO2 32 27  --  25 24 27 27 31 27     Hypokalemia Potassium level low at 3.4 today.  Replacement ordered. Recent Labs  Lab 10/12/23 1157 10/13/23 0409  K 4.0 3.4*    H/o stroke H/o renal artery stenosis PAD, HLD Continue Eliquis, Allergy to statin On Repatha as an outpatient  Anxiety/depression Lexapro  H/o breast cancer, anal cancer  Mobility: Ambulates with a walker.  Goals of care:   Code Status: Full Code    DVT prophylaxis:  apixaban (ELIQUIS) tablet 2.5 mg Start: 10/12/23 2200 apixaban (ELIQUIS) tablet 2.5 mg   Antimicrobials: IV Rocephin Fluid: None Consultants: Cardiology Family Communication: Husband at bedside  Status: Observation Level of care:  Telemetry Cardiac   Patient is from: Home Anticipated d/c to: Pending clinical course, likely home in 1 to 2 days  Diet: Diet Order             Diet Heart Room service appropriate? Yes; Fluid consistency: Thin  Diet effective now                   Scheduled Meds:  apixaban  2.5 mg Oral BID   diltiazem  30 mg Oral Q6H   metoprolol  100 mg Oral BID   potassium chloride  40 mEq Oral Once    PRN meds: acetaminophen **OR** acetaminophen, albuterol, bisacodyl, hydrALAZINE, polyethylene glycol   Infusions:   cefTRIAXone (ROCEPHIN)  IV 1 g (10/13/23 0850)    Antimicrobials: Anti-infectives (From admission, onward)    Start     Dose/Rate Route Frequency Ordered Stop   10/13/23 0800  cefTRIAXone (ROCEPHIN) 1 g in sodium chloride 0.9 % 100 mL IVPB        1 g 200 mL/hr over 30 Minutes Intravenous Every 24 hours 10/12/23 1804     10/12/23 1515  cefTRIAXone (ROCEPHIN) 1 g in sodium chloride 0.9 % 100 mL IVPB        1 g 200 mL/hr over 30 Minutes Intravenous  Once 10/12/23 1503 10/12/23 1611       Objective: Vitals:   10/13/23 1228 10/13/23 1252  BP: (!) 162/92 (!) 165/95  Pulse:  100  Resp:  16  Temp:    SpO2:  98%     Intake/Output Summary (Last 24 hours) at 10/13/2023 1454 Last data filed at 10/12/2023 2145 Gross per 24 hour  Intake 480 ml  Output --  Net 480 ml   Filed Weights   10/12/23 1815  Weight: 56.9 kg   Weight change:  Body mass index is 22.22 kg/m.   Physical Exam: General exam: Pleasant, elderly Caucasian female. Skin: No rashes, lesions or ulcers. HEENT: Atraumatic, normocephalic, no obvious bleeding Lungs: Clear to auscultation bilaterally,  CVS: Irregular tachycardia, no murmur GI/Abd: Soft, nontender, nondistended, bowel sound present,   CNS: Alert, awake, oriented x 3 slow to respond.  Has baseline memory lapses after stroke Psychiatry: Mood appropriate,  Extremities: No pedal edema, no calf tenderness,   Data Review: I have personally reviewed the laboratory data and studies available.  F/u labs  [employer] (From  admission, onward)     Start     Ordered   10/14/23 0500  Basic metabolic panel with GFR  Tomorrow morning,   R       Question:  Specimen collection method  Answer:  Lab=Lab collect   10/13/23 1453   10/14/23 0500  CBC with Differential/Platelet  Tomorrow morning,   R       Question:  Specimen collection method  Answer:  Lab=Lab collect   10/13/23 1453            Total time spent in review of labs and imaging, patient evaluation, formulation of plan, documentation and communication with family: 45 minutes  Signed, Lorin Glass, MD [facility] 10/13/2023

## 2023-10-14 ENCOUNTER — Observation Stay (HOSPITAL_BASED_OUTPATIENT_CLINIC_OR_DEPARTMENT_OTHER)

## 2023-10-14 DIAGNOSIS — I4891 Unspecified atrial fibrillation: Secondary | ICD-10-CM | POA: Diagnosis not present

## 2023-10-14 DIAGNOSIS — I4892 Unspecified atrial flutter: Secondary | ICD-10-CM | POA: Diagnosis not present

## 2023-10-14 LAB — CBC WITH DIFFERENTIAL/PLATELET
Abs Immature Granulocytes: 0.05 10*3/uL (ref 0.00–0.07)
Basophils Absolute: 0 10*3/uL (ref 0.0–0.1)
Basophils Relative: 0 %
Eosinophils Absolute: 0.1 10*3/uL (ref 0.0–0.5)
Eosinophils Relative: 1 %
HCT: 36.6 % (ref 36.0–46.0)
Hemoglobin: 11.8 g/dL — ABNORMAL LOW (ref 12.0–15.0)
Immature Granulocytes: 1 %
Lymphocytes Relative: 26 %
Lymphs Abs: 1.4 10*3/uL (ref 0.7–4.0)
MCH: 31.3 pg (ref 26.0–34.0)
MCHC: 32.2 g/dL (ref 30.0–36.0)
MCV: 97.1 fL (ref 80.0–100.0)
Monocytes Absolute: 1 10*3/uL (ref 0.1–1.0)
Monocytes Relative: 20 %
Neutro Abs: 2.7 10*3/uL (ref 1.7–7.7)
Neutrophils Relative %: 52 %
Platelets: 180 10*3/uL (ref 150–400)
RBC: 3.77 MIL/uL — ABNORMAL LOW (ref 3.87–5.11)
RDW: 13.5 % (ref 11.5–15.5)
WBC: 5.2 10*3/uL (ref 4.0–10.5)
nRBC: 0 % (ref 0.0–0.2)

## 2023-10-14 LAB — BASIC METABOLIC PANEL WITH GFR
Anion gap: 5 (ref 5–15)
BUN: 9 mg/dL (ref 8–23)
CO2: 27 mmol/L (ref 22–32)
Calcium: 10.4 mg/dL — ABNORMAL HIGH (ref 8.9–10.3)
Chloride: 107 mmol/L (ref 98–111)
Creatinine, Ser: 0.71 mg/dL (ref 0.44–1.00)
GFR, Estimated: >60 mL/min (ref >60)
Glucose, Bld: 95 mg/dL (ref 70–99)
Potassium: 3.9 mmol/L (ref 3.5–5.1)
Sodium: 139 mmol/L (ref 135–145)

## 2023-10-14 LAB — ECHOCARDIOGRAM COMPLETE
Area-P 1/2: 5.38 cm2
Height: 63 in
S' Lateral: 2.7 cm
Weight: 2007.07 [oz_av]

## 2023-10-14 NOTE — Progress Notes (Signed)
  Echocardiogram 2D Echocardiogram has been performed.  Leda Roys RDCS 10/14/2023, 3:33 PM

## 2023-10-14 NOTE — Progress Notes (Signed)
 Rounding Note    Patient Name: Jeanette [name] Date of Encounter: 10/14/2023  [facility] Cardiologist: None   Subjective   Denies any chest pain or SOB. Has good cardiac awareness of afib/aflutter  Inpatient Medications    Scheduled Meds:  apixaban  2.5 mg Oral BID   diltiazem  30 mg Oral Q6H   metoprolol  100 mg Oral BID   Continuous Infusions:  cefTRIAXone (ROCEPHIN)  IV 1 g (10/14/23 0904)   PRN Meds: acetaminophen **OR** acetaminophen, albuterol, bisacodyl, hydrALAZINE, polyethylene glycol   Vital Signs    Vitals:   10/13/23 2306 10/14/23 0443 10/14/23 0853 10/14/23 0935  BP: (!) 107/58 (!) 118/59 (!) 102/49 (!) 134/50  Pulse: 100 61 (!) 48 (!) 47  Resp: 20 16  16   Temp: 98 F (36.7 C) 98.2 F (36.8 C)  97.8 F (36.6 C)  TempSrc: Oral Oral  Oral  SpO2: 93% 95% 94% 98%  Weight:      Height:        Intake/Output Summary (Last 24 hours) at 10/14/2023 1021 Last data filed at 10/13/2023 1650 Gross per 24 hour  Intake 100 ml  Output --  Net 100 ml      10/12/2023    6:15 PM 11/19/2022   11:05 PM 11/19/2022   11:18 AM  Last 3 Weights  Weight (lbs) 125 lb 7.1 oz 116 lb 13.5 oz 130 lb  Weight (kg) 56.9 kg 53 kg 58.968 kg      Telemetry    Atrial flutter with HR around 100 bpm, converted around 3:40AM. Currently in NSR with HR 50-60s - Personally Reviewed  ECG    2:1 atrial flutter - Personally Reviewed  Physical Exam   GEN: No acute distress.   Neck: No JVD Cardiac: RRR, no murmurs, rubs, or gallops.  Respiratory: Clear to auscultation bilaterally. GI: Soft, nontender, non-distended  MS: No edema; No deformity. Neuro:  Nonfocal  Psych: Normal affect   Labs    High Sensitivity Troponin:  No results for input(s): "TROPONINIHS" in the last 720 hours.   Chemistry Recent Labs  Lab 10/12/23 1157 10/13/23 0409 10/14/23 0531  NA 140 139 139  K 4.0 3.4* 3.9  CL 104 105 107  CO2 31 27 27   GLUCOSE 98 91 95  BUN 11 9 9   CREATININE 0.87  0.71 0.71  CALCIUM 11.3* 10.7* 10.4*  PROT 6.9  --   --   ALBUMIN 4.1  --   --   AST 18  --   --   ALT 22  --   --   ALKPHOS 59  --   --   BILITOT 0.5  --   --   GFRNONAA >60 >60 >60  ANIONGAP 5 7 5     Lipids No results for input(s): "CHOL", "TRIG", "HDL", "LABVLDL", "LDLCALC", "CHOLHDL" in the last 168 hours.  Hematology Recent Labs  Lab 10/12/23 1157 10/13/23 0409 10/14/23 0531  WBC 5.8 5.9 5.2  RBC 4.22 3.67* 3.77*  HGB 13.2 11.4* 11.8*  HCT 41.4 34.9* 36.6  MCV 98.1 95.1 97.1  MCH 31.3 31.1 31.3  MCHC 31.9 32.7 32.2  RDW 13.5 13.3 13.5  PLT 211 159 180   Thyroid  Recent Labs  Lab 10/13/23 0409  TSH 5.168*    BNP Recent Labs  Lab 10/12/23 1157  BNP 264.2*    DDimer No results for input(s): "DDIMER" in the last 168 hours.   Radiology    DG Chest 2  View Result Date: 10/12/2023 CLINICAL DATA:  SOb EXAM: CHEST - 2 VIEW COMPARISON:  Chest x-ray 11/19/2022 FINDINGS: The heart and mediastinal contours are unchanged. Atherosclerotic plaque. No focal consolidation. No pulmonary edema. No pleural effusion. No pneumothorax. No acute osseous abnormality. IMPRESSION: 1. No active cardiopulmonary disease. 2.  Aortic Atherosclerosis (ICD10-I70.0). Electronically Signed   By: Tish Frederickson M.D.   On: 10/12/2023 13:07    Cardiac Studies   Echo 12/06/2019 1. Left ventricular ejection fraction, by estimation, is 65 to 70%. The  left ventricle has normal function. Left ventricular endocardial border  not optimally defined to evaluate regional wall motion. Left ventricular  diastolic parameters were normal.   2. Right ventricular systolic function is normal. The right ventricular  size is normal. There is normal pulmonary artery systolic pressure. The  estimated right ventricular systolic pressure is 22.4 mmHg.   3. Left atrial size was mildly dilated.   4. The mitral valve is grossly normal. Trivial mitral valve  regurgitation.   5. The aortic valve is tricuspid. Aortic  valve regurgitation is not  visualized. Mild aortic valve sclerosis is present, with no evidence of  aortic valve stenosis.   6. The inferior vena cava is normal in size with greater than 50%  respiratory variability, suggesting right atrial pressure of 3 mmHg.   Patient Profile     89 y.o. female with PMH of HTN, PAF (intolerant of multaq, not continued on amio due to concern of SE), h/o CVA, breast CA, anal CA, h/o prolonged posttermination pause (seen by Dr. Graciela Husbands, previously recommend PPM but not pursued) who presented on 4/5 with palpitation. Recently treated for UTI.   Assessment & Plan    PAF/atrial flutter  - Patient has a prior history of A-fib, she arrived in [place] atrial flutter based on EKG.  - She was resumed on home dose of metoprolol succinate 100 mg twice a day.  Low-dose diltiazem 30 mg every 6 hours was added yesterday.  - Patient self converted around 3:40 AM this morning.  She is feeling better without further palpitation.  She has a history of prolonged posttermination pause.  After she self converted, she has been sinus bradycardia with heart rate in the high 40s to 50s range.  I will discontinue low-dose diltiazem.  - Likely can be discharged from the cardiology perspective if echocardiogram is normal.  H/o prolonged posttermination pause: Patient patient has a history of prolonged posttermination pause lasting longer than 6 seconds, previously seen by EP service Dr. Graciela Husbands who recommended pacemaker therapy, however this was never pursued. Will need to be careful with rate control therapy.  PVCs: Patient continued to have frequent PVCs.  Pending echocardiogram, if EF is low, low threshold of ordering a 3-day heart monitor to quantify PVC burden.  HTN      For questions or updates, please contact  HeartCare Please consult [email or web address] for contact info under        Signed, Azalee Course, PA  10/14/2023, 10:21 AM

## 2023-10-14 NOTE — Progress Notes (Signed)
 Mobility Specialist Progress Note;   10/14/23 1145  Mobility  Activity Transferred from bed to chair  Level of Assistance Contact guard assist, steadying assist  Assistive Device Front wheel walker  Distance Ambulated (ft) 10 ft  Activity Response Tolerated well  Mobility Referral Yes  Mobility visit 1 Mobility  Mobility Specialist Start Time (ACUTE ONLY) 1145  Mobility Specialist Stop Time (ACUTE ONLY) 1156  Mobility Specialist Time Calculation (min) (ACUTE ONLY) 11 min   Pt agreeable to mobility. Required MinG assistance during ambulation for safety. VSS throughout. C/o dizziness w/ positional changes, however recovered once seated. Pt left comfortably in chair with all needs met, alarm on. Husband in room.   Jeanette [name] Mobility Specialist Please contact via SecureChat or [employer] [phone]

## 2023-10-14 NOTE — TOC CM/SW Note (Signed)
 Transition of Care [facility]) - Inpatient Brief Assessment   Patient Details  Name: Jeanette [name] MRN: [id number] Date of Birth: [birth year]  Transition of Care [facility]) CM/SW Contact:    Gala Lewandowsky, RN Phone Number: 10/14/2023, 1:23 PM   Clinical Narrative: Patient presented for atrial fib. PTA patient was from home with spouse. Patient has PCP and transportation to appointments. Patient gets medications without any issues.   Transition of Care Asessment: Insurance and Status: Insurance coverage has been reviewed Patient has primary care physician: Yes Home environment has been reviewed: reivewed Prior level of function:: independent Prior/Current Home Services: No current home services Social Drivers of Health Review: SDOH reviewed no interventions necessary Readmission risk has been reviewed: Yes Transition of care needs: no transition of care needs at this time

## 2023-10-14 NOTE — Discharge Summary (Signed)
 Physician Discharge Summary  Jeanette [name] NWG:[id number] DOB: 25-Feb-1935 DOA: 10/12/2023  PCP: Rodrigo Ran, MD  Admit date: 10/12/2023 Discharge date: 10/14/2023  Admitted From: home Discharge disposition: home  Recommendations at discharge:  Continue Toprol and Eliquis as before.  F/u cardiology as an outpatient   Brief narrative: Jeanette [name] is a 89 y.o. female with PMH significant for orthostatic hypotension, HLD, paroxysmal A-fib on Eliquis, stroke, PAD, renal artery stenosis, breast cancer, anal cancer anxiety/depression. Patient presented to ED at [facility] today with complaint of palpitation since last night before she went to bed.  Associated with mild shortness of breath.  She has a known history of A-fib and states she has gone into RVR several times in the past.  She is chronically anticoagulated with Eliquis but states that the dose was lowered because of bleeding in her eyes. Reports recent UTI for which she is on antibiotics by her urologist Dr. Earlene Plater at [facility]. Lives at home with husband.  Able to walk with a walker but does not get up much because of orthostatic hypotension.  When she has to get up, she is careful to maneuver slow.  In the ED, patient was afebrile, heart rate in 130s, blood pressure in 100s, breathing on room air Labs with unremarkable CBC, BMP Lactic acid 2 > 2.9 BNP 264 INR 1.8 Urinalysis showed hazy yellow urine with trace ketones, trace leukocytes, trace hemoglobin, no bacteria Blood culture sent Chest x-ray unremarkable EKG showed A flutter  with 2 is to 1 block. EDP discussed with cardiologist on-call Dr. Tenny Craw.  As recommended, patient was given digoxin. Patient was given 1 dose of IV Rocephin Admitted to [facility]  Subjective: Patient was seen and examined this morning.  Pleasant elderly Caucasian female.  Lying down in bed. Converted to normal sinus rhythm overnight.  Husband at bedside Cleared by cardiology for  discharge  Assessment/Plan: A flutter with RVR Patient history of paroxysmal A-fib, last seen by [facility] cardiology in February 2024.  Office note reviewed..  She used to be on Multaq which was switched to amiodarone because of nausea.  Patient states he did not take amiodarone because one of her family members had neuropathy due to amiodarone.  Currently seems to be on metoprolol. At one point, pacemaker placement was also discussed because of posttermination prolonged pauses as long as 6 seconds.  She however was not been able to get a pacemaker placed because of frequent UTI. Presented this time with palpitation, shortness of breath Not in CHF 4/6, cardiology added Cardizem but was later discontinued because blood pressure remained low. Echocardiogram unremarkable. After discussion with cardiology, patient was discharged on the same home dose of metoprolol.  To follow-up with cardiology as an outpatient. Chronically anticoagulated on Eliquis  Frequent UTI Reports recently completed a course of antibiotic prescribed by her urologist Dr. Earlene Plater at [facility]. Because of frequent UTIs he has not been able to get a pacemaker placed. Urinalysis on admission shows trace leukocytes, no bacteria. It seems patient was empirically started on IV Rocephin because of elevated lactic acid. She was treated with IV Rocephin WBC remains normal.  Lactic acid level improved. No antibiotic prescription needed at discharge. Recent Labs  Lab 10/12/23 1157 10/12/23 1357 10/13/23 0409 10/14/23 0531  WBC 5.8  --  5.9 5.2  LATICACIDVEN 2.0* 2.9* 1.2  --    Supine hypertension Orthostatic hypotension Per outpatient cardiology note, orthostatic hypotension with profound and hence supine hypertension was allowed. Supine blood pressure in normal  range currently. Patient states she is able to walk with a walker but does not get up much because of orthostatic hypotension.  When she has to get up, she is careful to  maneuver slow. Continue to monitor PTA meds- metoprolol, Benicar Continue both at discharge. Recent Labs    10/26/22 1014 11/19/22 1140 11/19/22 1146 11/20/22 0054 11/21/22 0822 11/22/22 0444 12/10/22 1250 10/12/23 1157 10/13/23 0409 10/14/23 0531  BUN 11 8 9  6* 6* 6* 9 11 9 9   CREATININE 0.71 0.70 0.50 0.61 0.64 0.62 0.69 0.87 0.71 0.71  CO2 32 27  --  25 24 27 27 31 27 27      H/o stroke H/o renal artery stenosis PAD, HLD Continue Eliquis, Allergy to statin On Repatha as an outpatient  Anxiety/depression Lexapro  H/o breast cancer, anal cancer  Goals of care:   Code Status: Prior   Diet:  Diet Order             Diet general                   Nutritional status:  Body mass index is 22.22 kg/m.       Wounds:  - Wound / Incision (Open or Dehisced) 11/19/22 Other (Comment) Thigh Anterior;Proximal;Right (Active)  Date First Assessed/Time First Assessed: 11/19/22 2002   Wound Type: (c) Other (Comment)  Location: Thigh  Location Orientation: Anterior;Proximal;Right  Present on Admission: Yes    Assessments 11/19/2022 11:19 PM 11/22/2022 12:20 PM  Dressing Type None None     No associated orders.    Discharge Exam:   Vitals:   10/14/23 0443 10/14/23 0853 10/14/23 0935 10/14/23 1135  BP: (!) 118/59 (!) 102/49 (!) 134/50 (!) 106/54  Pulse: 61 (!) 48 (!) 47 (!) 57  Resp: 16  16   Temp: 98.2 F (36.8 C)  97.8 F (36.6 C)   TempSrc: Oral  Oral   SpO2: 95% 94% 98%   Weight:      Height:        Body mass index is 22.22 kg/m.  General exam: Pleasant, elderly Caucasian female. Skin: No rashes, lesions or ulcers. HEENT: Atraumatic, normocephalic, no obvious bleeding Lungs: Clear to auscultation bilaterally,  CVS: Regular rate and rhythm GI/Abd: Soft, nontender, nondistended, bowel sound present,   CNS: Alert, awake, oriented x 3 slow to respond.  Has baseline memory lapses after stroke Psychiatry: Mood appropriate,  Extremities: No pedal edema,  no calf tenderness,   Follow ups:    Follow-up Information     Rodrigo Ran, MD Follow up.   Specialty: Internal Medicine Contact information: [address] [place] [address] [phone]                 Discharge Instructions:   Discharge Instructions     Call MD for:  difficulty breathing, headache or visual disturbances   Complete by: As directed    Call MD for:  extreme fatigue   Complete by: As directed    Call MD for:  hives   Complete by: As directed    Call MD for:  persistant dizziness or light-headedness   Complete by: As directed    Call MD for:  persistant nausea and vomiting   Complete by: As directed    Call MD for:  severe uncontrolled pain   Complete by: As directed    Call MD for:  temperature >100.4   Complete by: As directed    Diet general   Complete by: As  directed    Discharge instructions   Complete by: As directed    Recommendations at discharge:   Continue Toprol and Eliquis as before.   F/u cardiology as an outpatient  General discharge instructions: Follow with Primary MD Rodrigo Ran, MD in 7 days  Please request your PCP  to go over your hospital tests, procedures, radiology results at the follow up. Please get your medicines reviewed and adjusted.  Your PCP may decide to repeat certain labs or tests as needed. Do not drive, operate heavy machinery, perform activities at heights, swimming or participation in water activities or provide baby sitting services if your were admitted for syncope or siezures until you have seen by Primary MD or a Neurologist and advised to do so again. [place] Controlled Substance Reporting System database was reviewed. Do not drive, operate heavy machinery, perform activities at heights, swim, participate in water activities or provide baby-sitting services while on medications for pain, sleep and mood until your outpatient physician has reevaluated you and advised to do so again.  You are  strongly recommended to comply with the dose, frequency and duration of prescribed medications. Activity: As tolerated with Full fall precautions use walker/cane & assistance as needed Avoid using any recreational substances like cigarette, tobacco, alcohol, or non-prescribed drug. If you experience worsening of your admission symptoms, develop shortness of breath, life threatening emergency, suicidal or homicidal thoughts you must seek medical attention immediately by calling 911 or calling your MD immediately  if symptoms less severe. You must read complete instructions/literature along with all the possible adverse reactions/side effects for all the medicines you take and that have been prescribed to you. Take any new medicine only after you have completely understood and accepted all the possible adverse reactions/side effects.  Wear Seat belts while driving. You were cared for by a hospitalist during your hospital stay. If you have any questions about your discharge medications or the care you received while you were in the hospital after you are discharged, you can call the unit and ask to speak with the hospitalist or the covering physician. Once you are discharged, your primary care physician will handle any further medical issues. Please note that NO REFILLS for any discharge medications will be authorized once you are discharged, as it is imperative that you return to your primary care physician (or establish a relationship with a primary care physician if you do not have one).   Increase activity slowly   Complete by: As directed        Discharge Medications:   Allergies as of 10/14/2023       Reactions   Epinephrine Anaphylaxis   Shortness of breath and increased heart rate   Cozaar [losartan] Other (See Comments)   increased air hunger and dizziness.   Crestor [rosuvastatin] Other (See Comments)   memory problems.   Ezetimibe Other (See Comments)   Galantamine Hydrobromide Other  (See Comments)   wt loss and dizziness   Iron Diarrhea   Pregabalin Other (See Comments)   Septra [sulfamethoxazole-trimethoprim] Other (See Comments)   2017 had a strange reaction she says.   Vytorin [ezetimibe-simvastatin] Other (See Comments)   unknown   Benazepril Hcl Cough   Crestor [rosuvastatin Calcium] Other (See Comments)   cognitive   Lisinopril Cough        Medication List     STOP taking these medications    amoxicillin-clavulanate 500-125 MG tablet Commonly known as: AUGMENTIN       TAKE  these medications    acetaminophen 650 MG CR tablet Commonly known as: TYLENOL Take 650 mg by mouth 2 (two) times daily.   apixaban 5 MG Tabs tablet Commonly known as: ELIQUIS Take 1 tablet (5 mg total) by mouth 2 (two) times daily.   escitalopram 10 MG tablet Commonly known as: LEXAPRO Take 10 mg by mouth daily.   metoprolol succinate 100 MG 24 hr tablet Commonly known as: TOPROL-XL Take 100 mg by mouth 2 (two) times daily.   nitroGLYCERIN 0.4 MG SL tablet Commonly known as: NITROSTAT PLACE 1 TABLET (0.4 MG TOTAL) UNDER THE TONGUE EVERY 5 (FIVE) MINUTES AS NEEDED FOR CHEST PAIN.   polyethylene glycol 17 g packet Commonly known as: MIRALAX / GLYCOLAX Take 17 g by mouth daily as needed for mild constipation.   Repatha SureClick 140 MG/ML Soaj Generic drug: Evolocumab Inject 140 mg into the skin See admin instructions. Take 140mg  SQ every two weeks per patient         The results of significant diagnostics from this hospitalization (including imaging, microbiology, ancillary and laboratory) are listed below for reference.    Procedures and Diagnostic Studies:   DG Chest 2 View Result Date: 10/12/2023 CLINICAL DATA:  SOb EXAM: CHEST - 2 VIEW COMPARISON:  Chest x-ray 11/19/2022 FINDINGS: The heart and mediastinal contours are unchanged. Atherosclerotic plaque. No focal consolidation. No pulmonary edema. No pleural effusion. No pneumothorax. No acute osseous  abnormality. IMPRESSION: 1. No active cardiopulmonary disease. 2.  Aortic Atherosclerosis (ICD10-I70.0). Electronically Signed   By: Tish Frederickson M.D.   On: 10/12/2023 13:07     Labs:   Basic Metabolic Panel: Recent Labs  Lab 10/12/23 1157 10/13/23 0409 10/14/23 0531  NA 140 139 139  K 4.0 3.4* 3.9  CL 104 105 107  CO2 31 27 27   GLUCOSE 98 91 95  BUN 11 9 9   CREATININE 0.87 0.71 0.71  CALCIUM 11.3* 10.7* 10.4*   GFR Estimated Creatinine Clearance: 41 mL/min (by C-G formula based on SCr of 0.71 mg/dL). Liver Function Tests: Recent Labs  Lab 10/12/23 1157  AST 18  ALT 22  ALKPHOS 59  BILITOT 0.5  PROT 6.9  ALBUMIN 4.1   No results for input(s): "LIPASE", "AMYLASE" in the last 168 hours. No results for input(s): "AMMONIA" in the last 168 hours. Coagulation profile Recent Labs  Lab 10/12/23 1157  INR 1.3*    CBC: Recent Labs  Lab 10/12/23 1157 10/13/23 0409 10/14/23 0531  WBC 5.8 5.9 5.2  NEUTROABS 3.6  --  2.7  HGB 13.2 11.4* 11.8*  HCT 41.4 34.9* 36.6  MCV 98.1 95.1 97.1  PLT 211 159 180   Cardiac Enzymes: No results for input(s): "CKTOTAL", "CKMB", "CKMBINDEX", "TROPONINI" in the last 168 hours. BNP: Invalid input(s): "POCBNP" CBG: No results for input(s): "GLUCAP" in the last 168 hours. D-Dimer No results for input(s): "DDIMER" in the last 72 hours. Hgb A1c No results for input(s): "HGBA1C" in the last 72 hours. Lipid Profile No results for input(s): "CHOL", "HDL", "LDLCALC", "TRIG", "CHOLHDL", "LDLDIRECT" in the last 72 hours. Thyroid function studies Recent Labs    10/13/23 0409  TSH 5.168*   Anemia work up No results for input(s): "VITAMINB12", "FOLATE", "FERRITIN", "TIBC", "IRON", "RETICCTPCT" in the last 72 hours. Microbiology Recent Results (from the past 240 hours)  Blood Culture (routine x 2)     Status: None (Preliminary result)   Collection Time: 10/12/23 11:56 AM   Specimen: BLOOD RIGHT ARM  Result Value Ref  Range Status    Specimen Description   Final    BLOOD RIGHT ARM Performed at [facility], [address] [place], [place] [address]    Special Requests   Final    BOTTLES DRAWN AEROBIC AND ANAEROBIC Blood Culture adequate volume Performed at [facility], [address] [place], [place] [address]    Culture   Final    NO GROWTH 3 DAYS Performed at [facility], [address], [place], [place] [address]    Report Status PENDING  Incomplete  Blood Culture (routine x 2)     Status: None (Preliminary result)   Collection Time: 10/12/23 11:57 AM   Specimen: BLOOD LEFT ARM  Result Value Ref Range Status   Specimen Description   Final    BLOOD LEFT ARM Performed at [facility], [address], [place], [place] [address]    Special Requests   Final    BOTTLES DRAWN AEROBIC AND ANAEROBIC Blood Culture adequate volume Performed at [facility], [address] [place], [place] [address]    Culture   Final    NO GROWTH 3 DAYS Performed at [facility], [address], [place], [place] [address]    Report Status PENDING  Incomplete    Time coordinating discharge: 45 minutes  Signed: Nicci Vaughan  [facility] 10/15/2023, 3:55 PM

## 2023-10-14 NOTE — Plan of Care (Signed)

## 2023-10-17 LAB — CULTURE, BLOOD (ROUTINE X 2)
Culture: NO GROWTH
Culture: NO GROWTH
Special Requests: ADEQUATE
Special Requests: ADEQUATE

## 2023-11-05 ENCOUNTER — Emergency Department (HOSPITAL_COMMUNITY)

## 2023-11-05 ENCOUNTER — Encounter (HOSPITAL_COMMUNITY): Payer: Self-pay

## 2023-11-05 ENCOUNTER — Other Ambulatory Visit: Payer: Self-pay

## 2023-11-05 ENCOUNTER — Observation Stay (HOSPITAL_COMMUNITY)
Admission: EM | Admit: 2023-11-05 | Discharge: 2023-11-06 | Disposition: A | Discharge status: Home / Self Care | Attending: Cardiovascular Disease | Admitting: Cardiovascular Disease

## 2023-11-05 DIAGNOSIS — Z85048 Personal history of other malignant neoplasm of rectum, rectosigmoid junction, and anus: Secondary | ICD-10-CM | POA: Insufficient documentation

## 2023-11-05 DIAGNOSIS — Z8673 Personal history of transient ischemic attack (TIA), and cerebral infarction without residual deficits: Secondary | ICD-10-CM | POA: Diagnosis not present

## 2023-11-05 DIAGNOSIS — I5032 Chronic diastolic (congestive) heart failure: Secondary | ICD-10-CM | POA: Diagnosis present

## 2023-11-05 DIAGNOSIS — Z853 Personal history of malignant neoplasm of breast: Secondary | ICD-10-CM | POA: Diagnosis not present

## 2023-11-05 DIAGNOSIS — R531 Weakness: Secondary | ICD-10-CM | POA: Diagnosis present

## 2023-11-05 DIAGNOSIS — D649 Anemia, unspecified: Secondary | ICD-10-CM | POA: Diagnosis present

## 2023-11-05 DIAGNOSIS — I480 Paroxysmal atrial fibrillation: Secondary | ICD-10-CM | POA: Diagnosis not present

## 2023-11-05 DIAGNOSIS — I110 Hypertensive heart disease with heart failure: Secondary | ICD-10-CM | POA: Diagnosis not present

## 2023-11-05 DIAGNOSIS — Z7901 Long term (current) use of anticoagulants: Secondary | ICD-10-CM | POA: Insufficient documentation

## 2023-11-05 DIAGNOSIS — I4892 Unspecified atrial flutter: Principal | ICD-10-CM | POA: Diagnosis present

## 2023-11-05 DIAGNOSIS — I455 Other specified heart block: Secondary | ICD-10-CM

## 2023-11-05 DIAGNOSIS — E782 Mixed hyperlipidemia: Secondary | ICD-10-CM | POA: Diagnosis present

## 2023-11-05 DIAGNOSIS — I10 Essential (primary) hypertension: Secondary | ICD-10-CM | POA: Diagnosis present

## 2023-11-05 DIAGNOSIS — I4891 Unspecified atrial fibrillation: Secondary | ICD-10-CM | POA: Diagnosis present

## 2023-11-05 LAB — BASIC METABOLIC PANEL WITH GFR
Anion gap: 10 (ref 5–15)
BUN: 18 mg/dL (ref 8–23)
CO2: 25 mmol/L (ref 22–32)
Calcium: 10.8 mg/dL — ABNORMAL HIGH (ref 8.9–10.3)
Chloride: 103 mmol/L (ref 98–111)
Creatinine, Ser: 0.81 mg/dL (ref 0.44–1.00)
GFR, Estimated: >60 mL/min (ref >60)
Glucose, Bld: 105 mg/dL — ABNORMAL HIGH (ref 70–99)
Potassium: 3.8 mmol/L (ref 3.5–5.1)
Sodium: 138 mmol/L (ref 135–145)

## 2023-11-05 LAB — CBC
HCT: 41.1 % (ref 36.0–46.0)
Hemoglobin: 12.7 g/dL (ref 12.0–15.0)
MCH: 30.8 pg (ref 26.0–34.0)
MCHC: 30.9 g/dL (ref 30.0–36.0)
MCV: 99.8 fL (ref 80.0–100.0)
Platelets: 172 10*3/uL (ref 150–400)
RBC: 4.12 MIL/uL (ref 3.87–5.11)
RDW: 13.5 % (ref 11.5–15.5)
WBC: 5.1 10*3/uL (ref 4.0–10.5)
nRBC: 0 % (ref 0.0–0.2)

## 2023-11-05 LAB — TROPONIN I (HIGH SENSITIVITY)
Troponin I (High Sensitivity): 25 ng/L — ABNORMAL HIGH (ref <18)
Troponin I (High Sensitivity): 27 ng/L — ABNORMAL HIGH (ref <18)

## 2023-11-05 LAB — MAGNESIUM: Magnesium: 1.9 mg/dL (ref 1.7–2.4)

## 2023-11-05 MED ORDER — ACETAMINOPHEN 325 MG PO TABS: 650.0000 mg | ORAL_TABLET | ORAL | Status: DC | PRN

## 2023-11-05 MED ORDER — METOPROLOL TARTRATE 25 MG PO TABS
50.0000 mg | ORAL_TABLET | Freq: Once | ORAL | Status: DC
  Filled 2023-11-05: qty 2

## 2023-11-05 MED ORDER — METOPROLOL TARTRATE 25 MG PO TABS: 50.0000 mg | ORAL_TABLET | ORAL | Status: DC

## 2023-11-05 MED ORDER — DILTIAZEM HCL-DEXTROSE 125-5 MG/125ML-% IV SOLN (PREMIX)
5.0000 mg/h | INTRAVENOUS | Status: DC
  Filled 2023-11-05: qty 125

## 2023-11-05 MED ORDER — APIXABAN 2.5 MG PO TABS
2.5000 mg | ORAL_TABLET | Freq: Two times a day (BID) | ORAL | Status: DC
  Administered 2023-11-05 – 2023-11-06 (×2): 2.5 mg via ORAL
  Filled 2023-11-05 (×2): qty 1

## 2023-11-05 MED ORDER — FLECAINIDE ACETATE 50 MG PO TABS
150.0000 mg | ORAL_TABLET | Freq: Once | ORAL | Status: AC
  Administered 2023-11-05: 150 mg via ORAL
  Filled 2023-11-05: qty 3

## 2023-11-05 MED ORDER — ORAL CARE MOUTH RINSE: 15.0000 mL | OROMUCOSAL | Status: DC | PRN

## 2023-11-05 MED ORDER — METOPROLOL SUCCINATE ER 100 MG PO TB24: 100.0000 mg | ORAL_TABLET | Freq: Every day | ORAL | Status: DC

## 2023-11-05 MED ORDER — APIXABAN 2.5 MG PO TABS
2.5000 mg | ORAL_TABLET | Freq: Two times a day (BID) | ORAL | Status: DC
  Filled 2023-11-05: qty 1

## 2023-11-05 MED ORDER — METOPROLOL TARTRATE 50 MG PO TABS
50.0000 mg | ORAL_TABLET | Freq: Once | ORAL | Status: AC
  Administered 2023-11-05: 50 mg via ORAL
  Filled 2023-11-05: qty 1

## 2023-11-05 MED ORDER — ESCITALOPRAM OXALATE 10 MG PO TABS
10.0000 mg | ORAL_TABLET | Freq: Every day | ORAL | Status: DC
  Administered 2023-11-06: 10 mg via ORAL
  Filled 2023-11-05: qty 1

## 2023-11-05 MED ORDER — FLECAINIDE ACETATE 50 MG PO TABS
50.0000 mg | ORAL_TABLET | Freq: Two times a day (BID) | ORAL | Status: DC
  Administered 2023-11-06: 50 mg via ORAL
  Filled 2023-11-05: qty 1

## 2023-11-05 MED ORDER — POLYETHYLENE GLYCOL 3350 17 G PO PACK: 17.0000 g | PACK | Freq: Every day | ORAL | Status: DC | PRN

## 2023-11-05 MED ORDER — ONDANSETRON HCL 4 MG/2ML IJ SOLN: 4.0000 mg | Freq: Four times a day (QID) | INTRAMUSCULAR | Status: DC | PRN

## 2023-11-05 NOTE — H&P (Signed)
 Physician History and Physical     Patient ID: KINZLI [name] MRN: [id number] DOB/AGE: [birth year] 89 y.o. Admit date: 11/05/2023  Primary Care Physician: Aldo Hun, MD Primary Cardiologist: Patwardhan/Klein  Principal Problem:   Atrial flutter [facility])   HPI:  89 y.o. recurrent admission for PAF/flutter Just d/c 10/14/23 with recurrent event and converted with cardizem . D/C on beta blocker Has history of postural hypotension so did not want her on two AV nodal drugs. She has had monitor in past with upt to 36% PAF burden She is a retired Engineer, civil (consulting) and did not want amiodarone  ( multiple friends with side effects ) She has nausea with Multaq . She tends to have long post conversion termination pauses upto 6.5 seconds. Dr Rodolfo Clan talked to her about PPM 08/08/22 but deferred at time due to recurrent UTI's and not readdressed. She has no CAD and  echo 10/14/23 with normal EF and no LVH. She had recurrent weakness and palpitations today and is in ER with stable BP 128/89 and aflutter rate 140's She has been compliant with her eliquis  with no missed doses.   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   Qualifier: Diagnosis of   By: Andrena Bang, CMA, Carol       Depression    Dysrhythmia 2010   a-fib   Hyperlipidemia    Hypertension    Ischemic stroke (HCC) 12/04/2019   PVD (peripheral vascular disease) (HCC)    RAS (renal artery stenosis) (HCC)    Stroke (HCC) 2019   no residual, x 2    Family History  Problem Relation Age of Onset   Alzheimer's disease Mother    Pneumonia Mother    Heart failure Father    Heart  disease Sister    Heart disease Brother    Heart disease Brother    Heart disease Sister    Pneumonia Sister     Social History   Socioeconomic History   Marital status: Married    Spouse name: Jeanette [name]   Number of children: 4   Years of education: Not on file   Highest education level: Not on file  Occupational History   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  Tobacco Use   Smoking status: Never   Smokeless tobacco: Never  Vaping Use   Vaping status: Never Used  Substance and Sexual Activity   Alcohol use: Yes    Alcohol/week: 2.0 standard drinks of alcohol    Types: 2 Glasses of wine per week    Comment: rare   Drug use: No   Sexual activity: Yes    Partners: Male    Birth control/protection: Post-menopausal  Other Topics Concern   Not on file  Social History Narrative   Lives with spouse   Social Drivers of Health   Financial Resource Strain: Not on file  Food Insecurity: No Food Insecurity (10/12/2023)   Hunger Vital Sign    Worried About Running Out of Food in the Last Year: Never true    Ran Out of  Food in the Last Year: Never true  Transportation Needs: No Transportation Needs (10/12/2023)   PRAPARE - Administrator, Civil Service (Medical): No    Lack of Transportation (Non-Medical): No  Physical Activity: Not on file  Stress: Not on file  Social Connections: Socially Integrated (10/12/2023)   Social Connection and Isolation Panel [NHANES]    Frequency of Communication with Friends and Family: More than three times a week    Frequency of Social Gatherings with Friends and Family: More than three times a week    Attends Religious Services: More than 4 times per year    Active Member of [employer] or Organizations: No    Attends Engineer, structural: 1 to 4 times per year    Marital Status: Married  Catering manager Violence: Not At Risk (10/12/2023)   Humiliation, Afraid, Rape, and Kick questionnaire    Fear of Current or Ex-Partner: No     Emotionally Abused: No    Physically Abused: No    Sexually Abused: No    Past Surgical History:  Procedure Laterality Date   BREAST BIOPSY Left 01/14/2003   BREAST LUMPECTOMY Right 1989   CARDIOVASCULAR STRESS TEST  07/27/2003   No evidence if LV myocardial ischemia or scar. LV EF 74%.   GYNECOLOGIC CRYOSURGERY     IR CT HEAD LTD  12/04/2019   IR PERCUTANEOUS ART THROMBECTOMY/INFUSION INTRACRANIAL INC DIAG ANGIO  12/04/2019   IR US  GUIDE VASC ACCESS LEFT  12/04/2019   OPEN REDUCTION INTERNAL FIXATION (ORIF) DISTAL RADIAL FRACTURE Left 02/19/2017   Procedure: OPEN REDUCTION INTERNAL FIXATION (ORIF) LEFT DISTAL RADIAL FRACTURE;  Surgeon: Brunilda Capra, MD;  Location: [facility];  Service: Orthopedics;  Laterality: Left;   OVARIAN CYST REMOVAL     RADIOLOGY WITH ANESTHESIA N/A 12/04/2019   Procedure: IR WITH ANESTHESIA;  Surgeon: Luellen Sages, MD;  Location: [facility] OR;  Service: Radiology;  Laterality: N/A;   RENAL DOPPLER  09/09/2012   Right proximal renal artery: 60-99% diameter reduction. Normal patency of the left main renal artery.    TRANSTHORACIC ECHOCARDIOGRAM  03/22/2006   EF 55-60%, mild mitral valvular regurg, pulmonary artery systolic pressure was moderately increased.   TUBAL LIGATION       (Not in a hospital admission)   Physical Exam: Blood pressure 99/75, pulse (!) 142, temperature 97.6 F (36.4 C), resp. rate 12, height 5\' 3"  (1.6 m), weight 56.7 kg, SpO2 100%.    Thin elderly female No murmur Lungs clear Abdomen benign has diaper on Prior rectal cancer with XRT No edema Palpable pedal pulses   No current facility-administered medications on file prior to encounter.   Current Outpatient Medications on File Prior to Encounter  Medication Sig Dispense Refill   acetaminophen  (TYLENOL ) 650 MG CR tablet Take 650 mg by mouth 2 (two) times daily.     apixaban  (ELIQUIS ) 5 MG TABS tablet Take 1 tablet (5 mg total) by mouth 2 (two) times daily. 60 tablet 1    escitalopram  (LEXAPRO ) 10 MG tablet Take 10 mg by mouth daily.     metoprolol  succinate (TOPROL -XL) 100 MG 24 hr tablet Take 100 mg by mouth 2 (two) times daily.     nitroGLYCERIN  (NITROSTAT ) 0.4 MG SL tablet PLACE 1 TABLET (0.4 MG TOTAL) UNDER THE TONGUE EVERY 5 (FIVE) MINUTES AS NEEDED FOR CHEST PAIN. 75 tablet 1   polyethylene glycol (MIRALAX  / GLYCOLAX ) 17 g packet Take 17 g by mouth daily as needed for mild  constipation. 14 each 0   REPATHA SURECLICK 140 MG/ML SOAJ Inject 140 mg into the skin See admin instructions. Take 140mg  SQ every two weeks per patient      Labs:   Lab Results  Component Value Date   WBC 5.1 11/05/2023   HGB 12.7 11/05/2023   HCT 41.1 11/05/2023   MCV 99.8 11/05/2023   PLT 172 11/05/2023    Recent Labs  Lab 11/05/23 1255  NA 138  K 3.8  CL 103  CO2 25  BUN 18  CREATININE 0.81  CALCIUM  10.8*  GLUCOSE 105*   Lab Results  Component Value Date   CKTOTAL 60 11/19/2022   TROPONINI <0.03 03/15/2018     Lab Results  Component Value Date   CHOL 119 12/05/2019   CHOL 182 03/16/2018   Lab Results  Component Value Date   HDL 44 12/05/2019   HDL 40 (L) 03/16/2018   Lab Results  Component Value Date   LDLCALC 59 12/05/2019   LDLCALC 125 (H) 03/16/2018   Lab Results  Component Value Date   TRIG 79 12/05/2019   TRIG 85 03/16/2018   Lab Results  Component Value Date   CHOLHDL 2.7 12/05/2019   CHOLHDL 4.6 03/16/2018   No results found for: "LDLDIRECT"     Radiology: DG Chest 1 View Result Date: 11/05/2023 CLINICAL DATA:  5107 Atrial fibrillation (HCC) 5107. EXAM: CHEST  1 VIEW COMPARISON:  10/12/2023. FINDINGS: Linear areas of scarring/atelectasis noted overlying the left lower lung zone, laterally. Bilateral lung fields are otherwise clear. Bilateral costophrenic angles are clear. Normal cardio-mediastinal silhouette. No acute osseous abnormalities. The soft tissues are within normal limits. IMPRESSION: No active disease. Electronically  Signed   By: Beula Brunswick M.D.   On: 11/05/2023 12:49   ECHOCARDIOGRAM COMPLETE Result Date: 10/14/2023    ECHOCARDIOGRAM REPORT   Patient Name:   Jeanette [name] Date of Exam: 10/14/2023 Medical Rec #:  [id number]       Height:       63.0 in Accession #:    [id number]      Weight:       125.4 lb Date of Birth:  06-Jul-1935       BSA:          1.586 m Patient Age:    89 years        BP:           106/54 mmHg Patient Gender: F               HR:           61 bpm. Exam Location:  Inpatient Procedure: 2D Echo, Color Doppler and Cardiac Doppler (Both Spectral and Color            Flow Doppler were utilized during procedure). Indications:    Afib I48.91  History:        Patient has prior history of Echocardiogram examinations, most                 recent 12/06/2019.  Sonographer:    Hersey Lorenzo RDCS Referring Phys: [id number] BINAYA DAHAL IMPRESSIONS  1. Left ventricular ejection fraction, by estimation, is 60 to 65%. The left ventricle has normal function. The left ventricle has no regional wall motion abnormalities. Left ventricular diastolic parameters were grossly normal for age (medial e' 7.7 cm/s).  2. Right ventricular systolic function is normal. The right ventricular size is normal. Tricuspid regurgitation signal is inadequate for assessing PA pressure.  3. The mitral valve is grossly normal. Trivial mitral valve regurgitation. No evidence of mitral stenosis.  4. The aortic valve is tricuspid. There is mild calcification of the aortic valve. Aortic valve regurgitation is not visualized. No aortic stenosis is present.  5. The inferior vena cava is normal in size with greater than 50% respiratory variability, suggesting right atrial pressure of 3 mmHg. FINDINGS  Left Ventricle: Left ventricular ejection fraction, by estimation, is 60 to 65%. The left ventricle has normal function. The left ventricle has no regional wall motion abnormalities. The left ventricular internal cavity size was normal in size. There is  no  left ventricular hypertrophy. Left ventricular diastolic parameters were normal. Right Ventricle: The right ventricular size is normal. No increase in right ventricular wall thickness. Right ventricular systolic function is normal. Tricuspid regurgitation signal is inadequate for assessing PA pressure. Left Atrium: Left atrial size was normal in size. Right Atrium: Right atrial size was normal in size. Pericardium: There is no evidence of pericardial effusion. Mitral Valve: The mitral valve is grossly normal. Trivial mitral valve regurgitation. No evidence of mitral valve stenosis. Tricuspid Valve: The tricuspid valve is normal in structure. Tricuspid valve regurgitation is trivial. No evidence of tricuspid stenosis. Aortic Valve: The aortic valve is tricuspid. There is mild calcification of the aortic valve. Aortic valve regurgitation is not visualized. No aortic stenosis is present. Pulmonic Valve: The pulmonic valve was normal in structure. Pulmonic valve regurgitation is trivial. No evidence of pulmonic stenosis. Aorta: The aortic root is normal in size and structure. Venous: The inferior vena cava is normal in size with greater than 50% respiratory variability, suggesting right atrial pressure of 3 mmHg. IAS/Shunts: No atrial level shunt detected by color flow Doppler.  LEFT VENTRICLE PLAX 2D LVIDd:         4.20 cm   Diastology LVIDs:         2.70 cm   LV e' medial:    7.72 cm/s LV PW:         0.90 cm   LV E/e' medial:  11.6 LV IVS:        0.80 cm   LV e' lateral:   7.62 cm/s LVOT diam:     1.80 cm   LV E/e' lateral: 11.8 LV SV:         37 LV SV Index:   24 LVOT Area:     2.54 cm  RIGHT VENTRICLE            IVC RV S prime:     8.59 cm/s  IVC diam: 1.50 cm TAPSE (M-mode): 1.3 cm LEFT ATRIUM           Index LA diam:      3.90 cm 2.46 cm/m LA Vol (A4C): 40.9 ml 25.79 ml/m  AORTIC VALVE LVOT Vmax:   55.90 cm/s LVOT Vmean:  37.700 cm/s LVOT VTI:    0.147 m  AORTA Ao Root diam: 2.60 cm Ao Asc diam:  2.90 cm MITRAL  VALVE MV Area (PHT): 5.38 cm    SHUNTS MV Decel Time: 141 msec    Systemic VTI:  0.15 m MV E velocity: 89.60 cm/s  Systemic Diam: 1.80 cm MV A velocity: 43.00 cm/s MV E/A ratio:  2.08 Grady Lawman MD Electronically signed by Grady Lawman MD Signature Date/Time: 10/14/2023/4:33:00 PM    Final    DG Chest 2 View Result Date: 10/12/2023 CLINICAL DATA:  SOb EXAM: CHEST - 2 VIEW COMPARISON:  Chest x-ray 11/19/2022  FINDINGS: The heart and mediastinal contours are unchanged. Atherosclerotic plaque. No focal consolidation. No pulmonary edema. No pleural effusion. No pneumothorax. No acute osseous abnormality. IMPRESSION: 1. No active cardiopulmonary disease. 2.  Aortic Atherosclerosis (ICD10-I70.0). Electronically Signed   By: Morgane  Naveau M.D.   On: 10/12/2023 13:07    EKG: aflutter rates 140 normal QT and normal QT/QRS on baseline ECG  ASSESSMENT AND PLAN:   1. PAF:  Clearly the patient needs AAT. Given age doubt EP would consider ablation. Dr Doyle Generous notes also indicates Ic agent possible given lack of CAD, normal EF and no LVH with normal ECG intervals. She is on Rx Eliquis . Will give her lower pill in pocket flecainide dose of 150 mg now and start 50 mg bid in am. Watch on telemetry for post conversion pauses. If she has them will need to stay on eliquis  for 3 weeks and then consider holding it for PPM.  Mg normal 1.9, K normal 3.8 Cr 0.81 2. Rectal Cancer:  cured no surgery XRT wears a diaper 3 Postural hypotension: has been on midodrine in past not currently Avoid more than one AV nodal drug  She will tentatively be scheduled for [facility] Thursday if does not convert with Flecainide Did discuss potential for symptomatic pro arrhythmia and post conversion pauses with AAT.   Signed: Donata Fryer Nishan4/29/2025, 2:28 PM

## 2023-11-05 NOTE — Plan of Care (Signed)

## 2023-11-05 NOTE — ED Provider Notes (Signed)
 [name] EMERGENCY DEPARTMENT AT [facility] Provider Note   CSN: [id number] Arrival date & time: 11/05/23  1212     History  Chief Complaint  Patient presents with   Atrial Fibrillation    Jeanette [name] is a 89 y.o. female.   Atrial Fibrillation  Patient with history of atrial flutter.  Recent admission for same.  Last night began to feel heart going fast again.  States she was feeling weak.  Had vitals checked today and was tachycardic but was also hypotensive.  Has been compliant with her medicines.  Still on blood thinners.  No chest pain.    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 Authorizing Provider  acetaminophen  (TYLENOL ) 650 MG CR tablet Take 650 mg by mouth 2 (two) times daily.   Yes [provider]  apixaban  (ELIQUIS ) 5 MG TABS tablet Take 1 tablet (5 mg total) by mouth 2 (two) times daily. 12/06/19  Yes Rinehuls, Helga Loan, PA-C  escitalopram  (LEXAPRO ) 10 MG tablet Take 10 mg by mouth daily.   Yes [provider]  metoprolol  succinate (TOPROL -XL) 100 MG 24 hr tablet Take 100 mg by mouth 2 (two) times daily. 09/03/23  Yes [provider]  REPATHA SURECLICK 140 MG/ML SOAJ Inject 140 mg into the skin See admin instructions. Take 140mg  SQ every two weeks per patient 10/12/19  Yes [provider]  nitroGLYCERIN   (NITROSTAT ) 0.4 MG SL tablet PLACE 1 TABLET (0.4 MG TOTAL) UNDER THE TONGUE EVERY 5 (FIVE) MINUTES AS NEEDED FOR CHEST PAIN. 07/13/19 10/14/23  Patwardhan, Kaye Parsons, MD  polyethylene glycol (MIRALAX  / GLYCOLAX ) 17 g packet Take 17 g by mouth daily as needed for mild constipation. 11/22/22   Akula, Vijaya, MD      Allergies    Epinephrine , Cozaar [losartan], Crestor [rosuvastatin], Ezetimibe, Galantamine hydrobromide, Iron, Pregabalin, Septra [sulfamethoxazole-trimethoprim], Vytorin [ezetimibe-simvastatin], Benazepril hcl, Crestor [rosuvastatin calcium ], and Lisinopril    Review of Systems   Review of Systems  Physical Exam Updated Vital Signs BP 99/75   Pulse (!) 142   Temp 97.6 F (36.4 C)   Resp 12   Ht 5\' 3"  (1.6 m)   Wt 56.7 kg   SpO2 100%   BMI 22.14 kg/m  Physical Exam Vitals and nursing note reviewed.  Cardiovascular:     Rate and Rhythm: Tachycardia present.  Pulmonary:     Breath sounds: No wheezing.  Musculoskeletal:     Cervical back: Neck supple.     Right lower leg: No edema.     Left lower leg: No edema.  Skin:    Capillary Refill: Capillary refill takes less than 2 seconds.  Neurological:     Mental Status: She is  alert.     ED Results / Procedures / Treatments   Labs (all labs ordered are listed, but only abnormal results are displayed) Labs Reviewed  BASIC METABOLIC PANEL WITH GFR - Abnormal; Notable for the following components:      Result Value   Glucose, Bld 105 (*)    Calcium  10.8 (*)    All other components within normal limits  TROPONIN I (HIGH SENSITIVITY) - Abnormal; Notable for the following components:   Troponin I (High Sensitivity) 25 (*)    All other components within normal limits  TROPONIN I (HIGH SENSITIVITY) - Abnormal; Notable for the following components:   Troponin I (High Sensitivity) 27 (*)    All other components within normal limits  CBC  MAGNESIUM     EKG EKG Interpretation Date/Time:  Tuesday November 05 2023 12:19:39  EDT Ventricular Rate:  145 PR Interval:    QRS Duration:  84 QT Interval:  310 QTC Calculation: 481 R Axis:   72  Text Interpretation: atrial flutter 2:1 blcok Nonspecific ST and T wave abnormality Abnormal ECG When compared with ECG of 12-Oct-2023 13:36, No significant change since last tracing Confirmed by Mozell Arias [id number]) on 11/05/2023 12:47:23 PM  Radiology DG Chest 1 View Result Date: 11/05/2023 CLINICAL DATA:  5107 Atrial fibrillation (HCC) 5107. EXAM: CHEST  1 VIEW COMPARISON:  10/12/2023. FINDINGS: Linear areas of scarring/atelectasis noted overlying the left lower lung zone, laterally. Bilateral lung fields are otherwise clear. Bilateral costophrenic angles are clear. Normal cardio-mediastinal silhouette. No acute osseous abnormalities. The soft tissues are within normal limits. IMPRESSION: No active disease. Electronically Signed   By: Beula Brunswick M.D.   On: 11/05/2023 12:49    Procedures Procedures    Medications Ordered in ED Medications  metoprolol  tartrate (LOPRESSOR ) tablet 50 mg (has no administration in time range)  flecainide (TAMBOCOR) tablet 150 mg (has no administration in time range)  flecainide (TAMBOCOR) tablet 50 mg (has no administration in time range)  metoprolol  succinate (TOPROL -XL) 24 hr tablet 100 mg (has no administration in time range)  apixaban  (ELIQUIS ) tablet 2.5 mg (has no administration in time range)    ED Course/ Medical Decision Making/ A&P                                 Medical Decision Making Amount and/or Complexity of Data Reviewed Labs: ordered.  Risk Prescription drug management. Decision regarding hospitalization.   Patient with tachycardia.  Atrial flutter with RVR.  History of same.  Will get basic blood work.  Will start Cardizem .  Patient is been seen by cardiology.  Cardizem  will be held.  Patient will be admitted and given flecainide after oral metoprolol .  Blood work reviewed and reassuring.  I discussed  with Dr. Stann Earnest from cardiology  CRITICAL CARE Performed by: Mozell Arias Total critical care time: 30 minutes Critical care time was exclusive of separately billable procedures and treating other patients. Critical care was necessary to treat or prevent imminent or life-threatening deterioration. Critical care was time spent personally by me on the following activities: development of treatment plan with patient and/or surrogate as well as nursing, discussions with consultants, evaluation of patient's response to treatment, examination of patient, obtaining history from patient or surrogate, ordering and performing treatments and interventions, ordering and review of laboratory studies, ordering and review of radiographic studies, pulse oximetry and re-evaluation of patient's condition.  Final Clinical Impression(s) / ED Diagnoses Final diagnoses:  Atrial flutter with rapid ventricular response [facility])    Rx / DC Orders ED Discharge Orders     None         Mozell Arias, MD 11/05/23 1531

## 2023-11-05 NOTE — ED Provider Triage Note (Signed)
 Emergency Medicine Provider Triage Evaluation Note  Jeanette [name] , a 89 y.o. female  was evaluated in triage.  Pt complains of Afib  Review of Systems  Positive: fatigue Negative: Shortness of breath  Physical Exam  BP 105/87 (BP Location: Left Arm)   Pulse (!) 143   Temp 97.6 F (36.4 C)   Resp (!) 22   Ht 5\' 3"  (1.6 m)   Wt 56.7 kg   SpO2 100%   BMI 22.14 kg/m  Gen:   Awake, no distress   Resp:  Normal effort  MSK:   Moves extremities without difficulty  Other:    Medical Decision Making  Medically screening exam initiated at 12:35 PM.  Appropriate orders placed.  Jeanette [name] was informed that the remainder of the evaluation will be completed by another provider, this initial triage assessment does not replace that evaluation, and the importance of remaining in the ED until their evaluation is complete.  Pt moved to room   Jeanette [name], [place] 11/05/23 1236

## 2023-11-05 NOTE — ED Triage Notes (Signed)
 Patient reports was feeling weak and washed out last night and when husband came home this morning he otok her vitals and she was noted in afib. Hx of afib and on eliquis . Complains of generalized weakness and fatigue.

## 2023-11-05 NOTE — ED Notes (Signed)
Patient refusing blood draw in triage.

## 2023-11-06 ENCOUNTER — Inpatient Hospital Stay (HOSPITAL_COMMUNITY)
Admit: 2023-11-06 | Discharge: 2023-11-06 | Disposition: A | Discharge status: Home / Self Care | Attending: Physician Assistant

## 2023-11-06 ENCOUNTER — Other Ambulatory Visit (HOSPITAL_COMMUNITY): Payer: Self-pay

## 2023-11-06 DIAGNOSIS — I4892 Unspecified atrial flutter: Secondary | ICD-10-CM

## 2023-11-06 DIAGNOSIS — I483 Typical atrial flutter: Secondary | ICD-10-CM

## 2023-11-06 DIAGNOSIS — I455 Other specified heart block: Secondary | ICD-10-CM

## 2023-11-06 DIAGNOSIS — E782 Mixed hyperlipidemia: Secondary | ICD-10-CM | POA: Diagnosis not present

## 2023-11-06 DIAGNOSIS — I4891 Unspecified atrial fibrillation: Secondary | ICD-10-CM

## 2023-11-06 DIAGNOSIS — D649 Anemia, unspecified: Secondary | ICD-10-CM | POA: Diagnosis not present

## 2023-11-06 DIAGNOSIS — I5032 Chronic diastolic (congestive) heart failure: Secondary | ICD-10-CM | POA: Diagnosis not present

## 2023-11-06 LAB — BASIC METABOLIC PANEL WITH GFR
Anion gap: 7 (ref 5–15)
BUN: 14 mg/dL (ref 8–23)
CO2: 28 mmol/L (ref 22–32)
Calcium: 10.7 mg/dL — ABNORMAL HIGH (ref 8.9–10.3)
Chloride: 104 mmol/L (ref 98–111)
Creatinine, Ser: 0.77 mg/dL (ref 0.44–1.00)
GFR, Estimated: >60 mL/min (ref >60)
Glucose, Bld: 88 mg/dL (ref 70–99)
Potassium: 4.1 mmol/L (ref 3.5–5.1)
Sodium: 139 mmol/L (ref 135–145)

## 2023-11-06 LAB — CBC
HCT: 33.3 % — ABNORMAL LOW (ref 36.0–46.0)
Hemoglobin: 10.6 g/dL — ABNORMAL LOW (ref 12.0–15.0)
MCH: 31 pg (ref 26.0–34.0)
MCHC: 31.8 g/dL (ref 30.0–36.0)
MCV: 97.4 fL (ref 80.0–100.0)
Platelets: 154 10*3/uL (ref 150–400)
RBC: 3.42 MIL/uL — ABNORMAL LOW (ref 3.87–5.11)
RDW: 13.3 % (ref 11.5–15.5)
WBC: 4.4 10*3/uL (ref 4.0–10.5)
nRBC: 0 % (ref 0.0–0.2)

## 2023-11-06 MED ORDER — METOPROLOL SUCCINATE ER 25 MG PO TB24: 25.0000 mg | ORAL_TABLET | Freq: Every day | ORAL | Status: DC

## 2023-11-06 MED ORDER — APIXABAN 2.5 MG PO TABS
2.5000 mg | ORAL_TABLET | Freq: Two times a day (BID) | ORAL | 3 refills | Status: AC
  Filled 2023-11-06: qty 180, 90d supply, fill #0

## 2023-11-06 MED ORDER — FLECAINIDE ACETATE 50 MG PO TABS
50.0000 mg | ORAL_TABLET | Freq: Two times a day (BID) | ORAL | 3 refills | Status: DC
  Filled 2023-11-06: qty 169, 85d supply, fill #0

## 2023-11-06 MED ORDER — METOPROLOL SUCCINATE ER 25 MG PO TB24
25.0000 mg | ORAL_TABLET | Freq: Every day | ORAL | 3 refills | Status: DC
  Filled 2023-11-06: qty 90, 90d supply, fill #0

## 2023-11-06 NOTE — Plan of Care (Signed)
   Problem: Education: Goal: Knowledge of General Education information will improve Description Including pain rating scale, medication(s)/side effects and non-pharmacologic comfort measures Outcome: Progressing   Problem: Health Behavior/Discharge Planning: Goal: Ability to manage health-related needs will improve Outcome: Progressing

## 2023-11-06 NOTE — TOC CM/SW Note (Signed)
 Transition of Care [facility]) - Inpatient Brief Assessment   Patient Details  Name: CHELSEA JUNKIN MRN: [id number] Date of Birth: 09/03/1934  [facility] of [facility]) CM/SW Contact:    Cosimo Diones, RN Phone Number: 11/06/2023, 11:28 AM   Clinical Narrative: Patient presented for atrial flutter. PTA patient states she is from home with spouse. Per patient, spouse assists with her medications and prepares meals. Spouse also drives the patient to appointments.  Patient has DME rolling walker. No home needs identified at this time. Spouse to provide transportation home.    Transition of Care Asessment: Insurance and Status: Insurance coverage has been reviewed Patient has primary care physician: Yes Home environment has been reviewed: reviewed Prior level of function:: independent Prior/Current Home Services: No current home services Social Drivers of Health Review: SDOH reviewed no interventions necessary Readmission risk has been reviewed: Yes Transition of care needs: no transition of care needs at this time

## 2023-11-06 NOTE — Consult Note (Addendum)
 ELECTROPHYSIOLOGY CONSULT NOTE    Patient ID: Jeanette [name] MRN: [id number], DOB/AGE: 08-Jan-1935 89 y.o.  Admit date: 11/05/2023 Date of Consult: 11/06/2023  Primary Physician: Aldo Hun, MD Primary Cardiologist: Patwardhan Electrophysiologist: Dr. Rodolfo Clan   Referring Provider: Dr. Stann Earnest  Patient Profile: Jeanette [name] is a 89 y.o. female with a history of PAF and post conversion pauses who is being seen today for the evaluation of PAF and possible second degree block at the request of Dr. Stann Earnest.  HPI:  Jeanette [name] is a 89 y.o. female with history as above.   Previously had discussion of PPM 08/08/2022 but deferred at that time due to recurrent UTI's.   Pt with recent admission for PAF, discharged on 10/14/23 with recurrent event and converted with cardizem . D/C on beta blocker Has history of postural hypotension so did not want her on two AV nodal drugs. She has had monitor in past with upt to 36% PAF burden.   She presented with recurrent AFL in the 140s with no missed doses of Eliquis  and was started on flecainide with conversion.  EP asked to see given skipped beats on tele and h/o post conversion pauses.   Feeling OK this am. HR picks up with movement and conversation. Denies syncope. Wants to avoid amiodarone  because her brother got neuropathy while on it. Denies edema.  Remembers discussion with Dr. Rodolfo Clan re: pacing, and still hopes to avoid if possible.   Labs Potassium4.1 (04/30 0444) Magnesium   1.9 (04/29 1255) Creatinine, ser  0.77 (04/30 0444) PLT  154 (04/30 0444) HGB  10.6* (04/30 0444) WBC 4.4 (04/30 0444) Troponin I (High Sensitivity)27* (04/29 1427).    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 LV EF 74%.   GYNECOLOGIC CRYOSURGERY     IR CT HEAD LTD  12/04/2019   IR PERCUTANEOUS ART THROMBECTOMY/INFUSION INTRACRANIAL INC DIAG ANGIO  12/04/2019   IR US  GUIDE VASC ACCESS LEFT  12/04/2019   OPEN REDUCTION INTERNAL FIXATION (ORIF) DISTAL RADIAL FRACTURE Left 02/19/2017   Procedure: OPEN REDUCTION INTERNAL FIXATION (ORIF) LEFT DISTAL RADIAL FRACTURE;  Surgeon: Brunilda Capra, MD;  Location: [facility];  Service: Orthopedics;  Laterality: Left;   OVARIAN CYST REMOVAL     RADIOLOGY WITH ANESTHESIA N/A 12/04/2019   Procedure: IR WITH ANESTHESIA;  Surgeon: Luellen Sages, MD;  Location: [facility] OR;  Service: Radiology;  Laterality: N/A;   RENAL DOPPLER  09/09/2012   Right proximal renal artery: 60-99% diameter reduction. Normal patency of the left main renal artery.    TRANSTHORACIC ECHOCARDIOGRAM  03/22/2006   EF 55-60%, mild mitral valvular regurg, pulmonary artery systolic pressure was moderately increased.   TUBAL LIGATION       Medications Prior to Admission  Medication Sig Dispense Refill Last Dose/Taking   acetaminophen  (TYLENOL ) 650 MG CR tablet Take 650 mg by mouth 2 (two) times daily.   11/05/2023 Morning   apixaban  (ELIQUIS ) 5 MG TABS tablet Take 1 tablet (5 mg total) by mouth 2 (two) times daily. 60 tablet 1 11/05/2023 at  9:00 AM   escitalopram  (LEXAPRO ) 10 MG tablet Take 10 mg by mouth daily.   11/05/2023 Morning   metoprolol  succinate (TOPROL -XL) 100 MG 24 hr tablet Take 100 mg by  mouth 2 (two) times daily.   11/05/2023 Morning   REPATHA SURECLICK 140 MG/ML SOAJ Inject 140 mg into the skin See admin instructions. Take 140mg  SQ every two weeks per patient   Past Month   VITAMIN D  PO Take 1 tablet by mouth daily.   11/05/2023 Morning   nitroGLYCERIN  (NITROSTAT ) 0.4 MG SL tablet PLACE 1 TABLET (0.4 MG TOTAL) UNDER THE TONGUE EVERY 5 (FIVE) MINUTES AS NEEDED FOR CHEST PAIN. 75 tablet 1     Inpatient Medications:   apixaban   2.5 mg Oral BID   escitalopram   10 mg Oral Daily   flecainide  50 mg Oral Q12H   metoprolol  succinate  25 mg Oral Daily    Allergies:  Allergies  Allergen Reactions   Epinephrine  Anaphylaxis    Shortness of breath and increased heart rate   Cozaar [Losartan] Other (See Comments)    increased air hunger and dizziness.   Crestor [Rosuvastatin] Other (See Comments)    memory problems.   Ezetimibe Other (See Comments)   Galantamine Hydrobromide Other (See Comments)    wt loss and dizziness   Iron Diarrhea   Pregabalin Other (See Comments)   Septra [Sulfamethoxazole-Trimethoprim] Other (See Comments)    2017 had a strange reaction she says.   Vytorin [Ezetimibe-Simvastatin] Other (See Comments)    unknown   Benazepril Hcl Cough   Crestor [Rosuvastatin Calcium ] Other (See Comments)    cognitive   Lisinopril Cough    Family History  Problem Relation Age of Onset   Alzheimer's disease Mother    Pneumonia Mother    Heart failure Father    Heart disease Sister    Heart disease Brother    Heart disease Brother    Heart disease Sister    Pneumonia Sister      Physical Exam: Vitals:   11/05/23 1743 11/05/23 1937 11/05/23 2322 11/06/23 0511  BP: 124/89 106/60 129/63 118/72  Pulse: (!) 104 66 65 (!) 53  Resp: 18 20 18 20   Temp: (!) 97.5 F (36.4 C) 97.8 F (36.6 C) 98.1 F (36.7 C) (!) 97.5 F (36.4 C)  TempSrc: Oral Oral Oral Oral  SpO2: 100% 98% 97% 98%  Weight:    55 kg  Height:        GEN- NAD, A&O x 3, normal affect HEENT:  Normocephalic, atraumatic Lungs- CTAB, Normal effort.  Heart- Regular rate and rhythm, No M/G/R.  GI- Soft, NT, ND.  Extremities- No clubbing, cyanosis, or edema   Radiology/Studies: DG Chest 1 View Result Date: 11/05/2023 CLINICAL DATA:  5107 Atrial fibrillation (HCC) 5107. EXAM: CHEST  1 VIEW COMPARISON:  10/12/2023. FINDINGS: Linear areas of scarring/atelectasis noted overlying the left lower lung zone, laterally. Bilateral lung fields are otherwise clear. Bilateral costophrenic angles are clear. Normal cardio-mediastinal silhouette. No acute osseous  abnormalities. The soft tissues are within normal limits. IMPRESSION: No active disease. Electronically Signed   By: Beula Brunswick M.D.   On: 11/05/2023 12:49   ECHOCARDIOGRAM COMPLETE Result Date: 10/14/2023    ECHOCARDIOGRAM REPORT   Patient Name:   ARABELLAH HAILES Date of Exam: 10/14/2023 Medical Rec #:  [id number]       Height:       63.0 in Accession #:    [id number]      Weight:       125.4 lb Date of Birth:  1934-11-18       BSA:          1.586 m Patient Age:    89 years        BP:           106/54 mmHg Patient Gender: F               HR:           61 bpm. Exam Location:  Inpatient Procedure: 2D Echo, Color Doppler and Cardiac Doppler (Both Spectral and Color            Flow Doppler were utilized during procedure). Indications:    Afib I48.91  History:        Patient has prior history of Echocardiogram examinations, most                 recent 12/06/2019.  Sonographer:    Hersey Lorenzo RDCS Referring Phys: [id number] BINAYA DAHAL IMPRESSIONS  1. Left ventricular ejection fraction, by estimation, is 60 to 65%. The left ventricle has normal function. The left ventricle has no regional wall motion abnormalities. Left ventricular diastolic parameters were grossly normal for age (medial e' 7.7 cm/s).  2. Right ventricular systolic function is normal. The right ventricular size is normal. Tricuspid regurgitation signal is inadequate for assessing PA pressure.  3.  The mitral valve is grossly normal. Trivial mitral valve regurgitation. No evidence of mitral stenosis.  4. The aortic valve is tricuspid. There is mild calcification of the aortic valve. Aortic valve regurgitation is not visualized. No aortic stenosis is present.  5. The inferior vena cava is normal in size with greater than 50% respiratory variability, suggesting right atrial pressure of 3 mmHg. FINDINGS  Left Ventricle: Left ventricular ejection fraction, by estimation, is 60 to 65%. The left ventricle has normal function. The left ventricle has no regional wall motion abnormalities. The left ventricular internal cavity size was normal in size. There is  no left ventricular hypertrophy. Left ventricular diastolic parameters were normal. Right Ventricle: The right ventricular size is normal. No increase in right ventricular wall thickness. Right ventricular systolic function is normal. Tricuspid regurgitation signal is inadequate for assessing PA pressure. Left Atrium: Left atrial size was normal in size. Right Atrium: Right atrial size was normal in size. Pericardium: There is no evidence of pericardial effusion. Mitral Valve: The mitral valve is grossly normal. Trivial mitral valve regurgitation. No evidence of mitral valve stenosis. Tricuspid Valve: The tricuspid valve is normal in structure. Tricuspid valve regurgitation is trivial. No evidence of tricuspid stenosis. Aortic Valve: The aortic valve is tricuspid. There is mild calcification of the aortic valve. Aortic valve regurgitation is not visualized. No aortic stenosis is present. Pulmonic Valve: The pulmonic valve was normal in structure. Pulmonic valve regurgitation is trivial. No evidence of pulmonic stenosis. Aorta: The aortic root is normal in size and structure. Venous: The inferior vena cava is normal in size with greater  than 50% respiratory variability, suggesting right atrial pressure of 3 mmHg. IAS/Shunts: No atrial level shunt detected by color  flow Doppler.  LEFT VENTRICLE PLAX 2D LVIDd:         4.20 cm   Diastology LVIDs:         2.70 cm   LV e' medial:    7.72 cm/s LV PW:         0.90 cm   LV E/e' medial:  11.6 LV IVS:        0.80 cm   LV e' lateral:   7.62 cm/s LVOT diam:     1.80 cm   LV E/e' lateral: 11.8 LV SV:         37 LV SV Index:   24 LVOT Area:     2.54 cm  RIGHT VENTRICLE            IVC RV S prime:     8.59 cm/s  IVC diam: 1.50 cm TAPSE (M-mode): 1.3 cm LEFT ATRIUM           Index LA diam:      3.90 cm 2.46 cm/m LA Vol (A4C): 40.9 ml 25.79 ml/m  AORTIC VALVE LVOT Vmax:   55.90 cm/s LVOT Vmean:  37.700 cm/s LVOT VTI:    0.147 m  AORTA Ao Root diam: 2.60 cm Ao Asc diam:  2.90 cm MITRAL VALVE MV Area (PHT): 5.38 cm    SHUNTS MV Decel Time: 141 msec    Systemic VTI:  0.15 m MV E velocity: 89.60 cm/s  Systemic Diam: 1.80 cm MV A velocity: 43.00 cm/s MV E/A ratio:  2.08 Grady Lawman MD Electronically signed by Grady Lawman MD Signature Date/Time: 10/14/2023/4:33:00 PM    Final    DG Chest 2 View Result Date: 10/12/2023 CLINICAL DATA:  SOb EXAM: CHEST - 2 VIEW COMPARISON:  Chest x-ray 11/19/2022 FINDINGS: The heart and mediastinal contours are unchanged. Atherosclerotic plaque. No focal consolidation. No pulmonary edema. No pleural effusion. No pneumothorax. No acute osseous abnormality. IMPRESSION: 1. No active cardiopulmonary disease. 2.  Aortic Atherosclerosis (ICD10-I70.0). Electronically Signed   By: Morgane  Naveau M.D.   On: 10/12/2023 13:07    CWC:BJSEG shows sinus bradycardia at 45 bpm with Mobitz 1 vs blocked PACs (personally reviewed)  TELEMETRY: sinus bradycardia 40-50s with periods that appear to show Mobitz 1, but with only minimal PR prolongation more likely represent blocked PACs (personally reviewed)  Assessment/Plan:  Paroxysmal AF Mobitz 1, second degree AV block vs Blocked PACs EKG with Mobitz 1 and sinus brady as above.  For now, decrease Toprol  to 25 mg daily.  Continue flecainide 50 mg BID for now.   Baseline QTc too long for Tikosyn, as well as low CrCl at 41.  Pt refuses amiodarone  at this time.  Continue eliquis  without missed doses.   Dr. Lawana Pray has seen. OK for home on current regimen with changes as above. Ravindra Baranek make close follow up in AF clinic and with EP APP, and recommend Zio on d/c.   EP Colten Desroches see as needed while remains here.   For questions or updates, please contact [facility] Please consult [email or web address] for contact info under Cardiology/STEMI.  Signed, Tylene Galla, PA-C  11/06/2023 7:42 AM  I have seen and examined this patient with Michaelle Adolphus.  Agree with above, note added to reflect my findings.  Feeling well without acute complaints  GEN: No acute distress.   Neck: No JVD Cardiac: RRR, no murmurs, rubs, or gallops.  Respiratory: normal BS bases bilaterally. GI: Soft, nontender, non-distended  MS: No edema; No deformity. Neuro:  Nonfocal  Skin: warm and dry Psych: Normal affect    Paroxysmal atrial fibrillation: Patient has been started on flecainide.  Metoprolol  has been stopped due to bradycardia.  Ideally she would tolerate a rate controlling medication, but for now, would continue with flecainide.  If she remains in sinus rhythm and has no further postconversion pauses, would continue with current management. Second-degree AV block: Patient has had evidence of Mobitz 1 AV block.  She also is having blocked PACs this morning.  She is asymptomatic.  When she is awake, and moving around she has one-to-one conduction.  Arlander Gillen continue monitoring.  Would recommend ZIO monitor at discharge to evaluate for further pauses.  Hoping to avoid pacemaker implant due to multiple UTIs.  Darlene Bartelt make follow-up in EP clinic at discharge.  Ivet Guerrieri M. Melondy Blanchard MD 11/06/2023 9:42 AM

## 2023-11-06 NOTE — Discharge Summary (Addendum)
 Discharge Summary    Patient ID: Jeanette [name] MRN: [id number]; DOB: 08-04-1934  Admit date: 11/05/2023 Discharge date: 11/06/2023  PCP:  Aldo Hun, MD   [facility] Providers Cardiologist:  Cody Das, MD  Electrophysiologist:  Efraim Grange, MD       Discharge Diagnoses    Principal Problem:   Atrial flutter [facility]) Active Problems:   ANEMIA   Essential hypertension   Mixed hyperlipidemia   Chronic diastolic CHF (congestive heart failure) (HCC)   History of CVA (cerebrovascular accident)   Atrial fibrillation with RVR (HCC)   Diagnostic Studies/Procedures    Echo 10/14/23:  1. Left ventricular ejection fraction, by estimation, is 60 to 65%. The  left ventricle has normal function. The left ventricle has no regional  wall motion abnormalities. Left ventricular diastolic parameters were  grossly normal for age (medial e' 7.7  cm/s).   2. Right ventricular systolic function is normal. The right ventricular  size is normal. Tricuspid regurgitation signal is inadequate for assessing  PA pressure.   3. The mitral valve is grossly normal. Trivial mitral valve  regurgitation. No evidence of mitral stenosis.   4. The aortic valve is tricuspid. There is mild calcification of the  aortic valve. Aortic valve regurgitation is not visualized. No aortic  stenosis is present.   5. The inferior vena cava is normal in size with greater than 50%  respiratory variability, suggesting right atrial pressure of 3 mmHg.  _____________   History of Present Illness     Jeanette [name] is a 89 y.o. female with recurrent admission for PAF/flutter Just d/c 10/14/23 with recurrent event and converted with cardizem . D/C on beta blocker Has history of postural hypotension so did not want her on two AV nodal drugs. She has had monitor in past with upt to 36% PAF burden She is a retired Engineer, civil (consulting) and did not want amiodarone  ( multiple friends with side effects ) She has nausea with  Multaq . She tends to have long post conversion termination pauses upto 6.5 seconds. Dr Rodolfo Clan talked to her about PPM 08/08/22 but deferred at time due to recurrent UTI's and not readdressed. She has no CAD and  echo 10/14/23 with normal EF and no LVH. She had recurrent weakness and palpitations today and is in ER with stable BP 128/89 and aflutter rate 140's She has been compliant with her eliquis  with no missed doses.    Retired Engineer, civil (consulting) still active Discussed care with her husband of 55 years in room  Hospital Course     Consultants: EP - Dr. Lawana Pray  Persistent atrial flutter/fibrillation Chronic anticoagulation Postural hypotension Prior DCCV 10/14/23, admitted with recurrence. Hx of postural hypotension, so AV nodal agents are limited. Pt declined amiodarone , nausea with multaq . Hx of long post conversion pauses. Has discussed PPM with Dr. Rodolfo Clan, deferred due to recurrent UTIs. She received 150 mg flecainide on admission followed by 50 mg flecainide BID along with reduction in her toprol  to 25 mg (from 100 mg). She converted overnight to SR with mobitz 1 second degree AV block vs blocked PACs. EP evaluated and felt likely mobitz 1 AV block and blocked PACs. She is asymptomatic.   Discharge on 50 mg flecainide BID and 25 mg toprol . Will discharge on Zio patch with close follow up in Afib clinic and EP. Hoping to avoid PPM.    Eliquis  reduced to 2.5 mg BID due to weight less than 60 kg.      Did  the patient have an acute coronary syndrome (MI, NSTEMI, STEMI, etc) this admission?:  No                               Did the patient have a percutaneous coronary intervention (stent / angioplasty)?:  No.        The patient will be scheduled for a TOC follow up appointment in 7-14 days.  A message has been sent to the [facility] and Scheduling Pool at the office where the patient should be seen for follow up.  _____________  Discharge Vitals Blood pressure 118/72, pulse (!) 53, temperature (!) 97.5 F  (36.4 C), temperature source Oral, resp. rate 20, height 5\' 3"  (1.6 m), weight 55 kg, SpO2 98%.  Filed Weights   11/05/23 1224 11/06/23 0511  Weight: 56.7 kg 55 kg    Labs & Radiologic Studies    CBC Recent Labs    11/05/23 1255 11/06/23 0444  WBC 5.1 4.4  HGB 12.7 10.6*  HCT 41.1 33.3*  MCV 99.8 97.4  PLT 172 154   Basic Metabolic Panel Recent Labs    16/10/96 1255 11/06/23 0444  NA 138 139  K 3.8 4.1  CL 103 104  CO2 25 28  GLUCOSE 105* 88  BUN 18 14  CREATININE 0.81 0.77  CALCIUM  10.8* 10.7*  MG 1.9  --    Liver Function Tests No results for input(s): "AST", "ALT", "ALKPHOS", "BILITOT", "PROT", "ALBUMIN" in the last 72 hours. No results for input(s): "LIPASE", "AMYLASE" in the last 72 hours. High Sensitivity Troponin:   Recent Labs  Lab 11/05/23 1255 11/05/23 1427  TROPONINIHS 25* 27*    BNP Invalid input(s): "POCBNP" D-Dimer No results for input(s): "DDIMER" in the last 72 hours. Hemoglobin A1C No results for input(s): "HGBA1C" in the last 72 hours. Fasting Lipid Panel No results for input(s): "CHOL", "HDL", "LDLCALC", "TRIG", "CHOLHDL", "LDLDIRECT" in the last 72 hours. Thyroid  Function Tests No results for input(s): "TSH", "T4TOTAL", "T3FREE", "THYROIDAB" in the last 72 hours.  Invalid input(s): "FREET3" _____________  DG Chest 1 View Result Date: 11/05/2023 CLINICAL DATA:  5107 Atrial fibrillation (HCC) 5107. EXAM: CHEST  1 VIEW COMPARISON:  10/12/2023. FINDINGS: Linear areas of scarring/atelectasis noted overlying the left lower lung zone, laterally. Bilateral lung fields are otherwise clear. Bilateral costophrenic angles are clear. Normal cardio-mediastinal silhouette. No acute osseous abnormalities. The soft tissues are within normal limits. IMPRESSION: No active disease. Electronically Signed   By: Beula Brunswick M.D.   On: 11/05/2023 12:49   ECHOCARDIOGRAM COMPLETE Result Date: 10/14/2023    ECHOCARDIOGRAM REPORT   Patient Name:   Jeanette [name] Date of Exam: 10/14/2023 Medical Rec #:  [id number]       Height:       63.0 in Accession #:    [id number]      Weight:       125.4 lb Date of Birth:  Jan 06, 1935       BSA:          1.586 m Patient Age:    89 years        BP:           106/54 mmHg Patient Gender: F               HR:           61 bpm. Exam Location:  Inpatient Procedure: 2D Echo, Color Doppler and Cardiac Doppler (  Both Spectral and Color            Flow Doppler were utilized during procedure). Indications:    Afib I48.91  History:        Patient has prior history of Echocardiogram examinations, most                 recent 12/06/2019.  Sonographer:    Hersey Lorenzo RDCS Referring Phys: [id number] BINAYA DAHAL IMPRESSIONS  1. Left ventricular ejection fraction, by estimation, is 60 to 65%. The left ventricle has normal function. The left ventricle has no regional wall motion abnormalities. Left ventricular diastolic parameters were grossly normal for age (medial e' 7.7 cm/s).  2. Right ventricular systolic function is normal. The right ventricular size is normal. Tricuspid regurgitation signal is inadequate for assessing PA pressure.  3. The mitral valve is grossly normal. Trivial mitral valve regurgitation. No evidence of mitral stenosis.  4. The aortic valve is tricuspid. There is mild calcification of the aortic valve. Aortic valve regurgitation is not visualized. No aortic stenosis is present.  5. The inferior vena cava is normal in size with greater than 50% respiratory variability, suggesting right atrial pressure of 3 mmHg. FINDINGS  Left Ventricle: Left ventricular ejection fraction, by estimation, is 60 to 65%. The left ventricle has normal function. The left ventricle has no regional wall motion abnormalities. The left ventricular internal cavity size was normal in size. There is  no left ventricular hypertrophy. Left ventricular diastolic parameters were normal. Right Ventricle: The right ventricular size is normal. No increase in right  ventricular wall thickness. Right ventricular systolic function is normal. Tricuspid regurgitation signal is inadequate for assessing PA pressure. Left Atrium: Left atrial size was normal in size. Right At[facility]: Right atrial size was normal in size. Pericardium: There is no evidence of pericardial effusion. Mitral Valve: The mitral valve is grossly normal. Trivial mitral valve regurgitation. No evidence of mitral valve stenosis. Tricuspid Valve: The tricuspid valve is normal in structure. Tricuspid valve regurgitation is trivial. No evidence of tricuspid stenosis. Aortic Valve: The aortic valve is tricuspid. There is mild calcification of the aortic valve. Aortic valve regurgitation is not visualized. No aortic stenosis is present. Pulmonic Valve: The pulmonic valve was normal in structure. Pulmonic valve regurgitation is trivial. No evidence of pulmonic stenosis. Aorta: The aortic root is normal in size and structure. Venous: The inferior vena cava is normal in size with greater than 50% respiratory variability, suggesting right atrial pressure of 3 mmHg. IAS/Shunts: No atrial level shunt detected by color flow Doppler.  LEFT VENTRICLE PLAX 2D LVIDd:         4.20 cm   Diastology LVIDs:         2.70 cm   LV e' medial:    7.72 cm/s LV PW:         0.90 cm   LV E/e' medial:  11.6 LV IVS:        0.80 cm   LV e' lateral:   7.62 cm/s LVOT diam:     1.80 cm   LV E/e' lateral: 11.8 LV SV:         37 LV SV Index:   24 LVOT Area:     2.54 cm  RIGHT VENTRICLE            IVC RV S prime:     8.59 cm/s  IVC diam: 1.50 cm TAPSE (M-mode): 1.3 cm LEFT ATRIUM  Index LA diam:      3.90 cm 2.46 cm/m LA Vol (A4C): 40.9 ml 25.79 ml/m  AORTIC VALVE LVOT Vmax:   55.90 cm/s LVOT Vmean:  37.700 cm/s LVOT VTI:    0.147 m  AORTA Ao Root diam: 2.60 cm Ao Asc diam:  2.90 cm MITRAL VALVE MV Area (PHT): 5.38 cm    SHUNTS MV Decel Time: 141 msec    Systemic VTI:  0.15 m MV E velocity: 89.60 cm/s  Systemic Diam: 1.80 cm MV A velocity:  43.00 cm/s MV E/A ratio:  2.08 Grady Lawman MD Electronically signed by Grady Lawman MD Signature Date/Time: 10/14/2023/4:33:00 PM    Final    DG Chest 2 View Result Date: 10/12/2023 CLINICAL DATA:  SOb EXAM: CHEST - 2 VIEW COMPARISON:  Chest x-ray 11/19/2022 FINDINGS: The heart and mediastinal contours are unchanged. Atherosclerotic plaque. No focal consolidation. No pulmonary edema. No pleural effusion. No pneumothorax. No acute osseous abnormality. IMPRESSION: 1. No active cardiopulmonary disease. 2.  Aortic Atherosclerosis (ICD10-I70.0). Electronically Signed   By: Morgane  Naveau M.D.   On: 10/12/2023 13:07   Disposition   Pt is being discharged home today in good condition.  Follow-up Plans & Appointments        Discharge Medications   Allergies as of 11/06/2023       Reactions   Epinephrine  Anaphylaxis   Shortness of breath and increased heart rate   Cozaar [losartan] Other (See Comments)   increased air hunger and dizziness.   Crestor [rosuvastatin] Other (See Comments)   memory problems.   Ezetimibe Other (See Comments)   Galantamine Hydrobromide Other (See Comments)   wt loss and dizziness   Iron Diarrhea   Pregabalin Other (See Comments)   Septra [sulfamethoxazole-trimethoprim] Other (See Comments)   2017 had a strange reaction she says.   Vytorin [ezetimibe-simvastatin] Other (See Comments)   unknown   Benazepril Hcl Cough   Crestor [rosuvastatin Calcium ] Other (See Comments)   cognitive   Lisinopril Cough        Medication List     TAKE these medications    acetaminophen  650 MG CR tablet Commonly known as: TYLENOL  Take 650 mg by mouth 2 (two) times daily.   apixaban  2.5 MG Tabs tablet Commonly known as: ELIQUIS  Take 1 tablet (2.5 mg total) by mouth 2 (two) times daily. What changed:  medication strength how much to take   escitalopram  10 MG tablet Commonly known as: LEXAPRO  Take 10 mg by mouth daily.   flecainide 50 MG tablet Commonly  known as: TAMBOCOR Take 1 tablet (50 mg total) by mouth every 12 (twelve) hours.   metoprolol  succinate 25 MG 24 hr tablet Commonly known as: Toprol  XL Take 1 tablet (25 mg total) by mouth daily. Take with or immediately following a meal. What changed:  medication strength how much to take when to take this additional instructions   nitroGLYCERIN  0.4 MG SL tablet Commonly known as: NITROSTAT  PLACE 1 TABLET (0.4 MG TOTAL) UNDER THE TONGUE EVERY 5 (FIVE) MINUTES AS NEEDED FOR CHEST PAIN.   Repatha SureClick 140 MG/ML Soaj Generic drug: Evolocumab Inject 140 mg into the skin See admin instructions. Take 140mg  SQ every two weeks per patient   VITAMIN D  PO Take 1 tablet by mouth daily.           Outstanding Labs/Studies    Duration of Discharge Encounter: APP Time: 25 minutes   Signed, Lamond Pilot, PA 11/06/2023, 10:56 AM   Patient seen  by EP Despite PAF with rates as high as 144 bpm and post conversion pauses with wenkebach and HR;s 40's EP will Follow as outpatient with monitor rather than consider inpatient PPM this admission. She will be d/c on flecainide 50 mg bid as  New AAT. Intervals fine K/Mg fine and patient has no history of CAD, normal EF and no LVH on echo. She is improved from admission And willing to go home with monitor for now Discussed with Dr Lawana Pray and he wants her to go home on Toprol  25 mg despite AV block And bradycardia  Janelle Mediate MD [facility]

## 2023-11-06 NOTE — Care Management CC44 (Signed)
 Condition Code 44 Documentation Completed  Patient Details  Name: Jeanette [name] MRN: [id number] Date of Birth: 04-23-1935   Condition Code 44 given:  Yes Patient signature on Condition Code 44 notice:  Yes Documentation of 2 MD's agreement:  Yes Code 44 added to claim:  Yes    Cosimo Diones, RN 11/06/2023, 3:32 PM

## 2023-11-06 NOTE — Progress Notes (Signed)
   Cardiologist:  Patwardhan/Klein  Subjective:   Converted to sinus with bradycardia and wenkebach.  Feels better but "weak"   Objective:  Vitals:   11/05/23 1743 11/05/23 1937 11/05/23 2322 11/06/23 0511  BP: 124/89 106/60 129/63 118/72  Pulse: (!) 104 66 65 (!) 53  Resp: 18 20 18 20   Temp: (!) 97.5 F (36.4 C) 97.8 F (36.6 C) 98.1 F (36.7 C) (!) 97.5 F (36.4 C)  TempSrc: Oral Oral Oral Oral  SpO2: 100% 98% 97% 98%  Weight:    55 kg  Height:        Intake/Output from previous day: No intake or output data in the 24 hours ending 11/06/23 0837  Physical Exam:  Thin elderly female No murmur Lungs clear Abdomen benign has diaper on Prior rectal cancer with XRT No edema Palpable pedal pulses   Lab Results: Basic Metabolic Panel: Recent Labs    11/05/23 1255 11/06/23 0444  NA 138 139  K 3.8 4.1  CL 103 104  CO2 25 28  GLUCOSE 105* 88  BUN 18 14  CREATININE 0.81 0.77  CALCIUM  10.8* 10.7*  MG 1.9  --     CBC: Recent Labs    11/05/23 1255 11/06/23 0444  WBC 5.1 4.4  HGB 12.7 10.6*  HCT 41.1 33.3*  MCV 99.8 97.4  PLT 172 154     Imaging: DG Chest 1 View Result Date: 11/05/2023 CLINICAL DATA:  5107 Atrial fibrillation (HCC) 5107. EXAM: CHEST  1 VIEW COMPARISON:  10/12/2023. FINDINGS: Linear areas of scarring/atelectasis noted overlying the left lower lung zone, laterally. Bilateral lung fields are otherwise clear. Bilateral costophrenic angles are clear. Normal cardio-mediastinal silhouette. No acute osseous abnormalities. The soft tissues are within normal limits. IMPRESSION: No active disease. Electronically Signed   By: Beula Brunswick M.D.   On: 11/05/2023 12:49    Cardiac Studies:  ECG: flutter rate 140 normal QT/QRS 11/06/2023 SB with dropped P waves    Telemetry: SR/SB with wenkebach rates 44-52 bpm  Echo: 10/14/23 EF 60-65%   Medications:    apixaban   2.5 mg Oral BID   escitalopram   10 mg Oral Daily   flecainide  50 mg Oral Q12H       Assessment/Plan:   PAF:  Failed multaq , refuses amiodarone . Converted with flecainide over night. Known SSS with prior post conversion 6 second pauses. PPM deferred last year due to UTI's. Hold beta blocker this am. EP to see regarding PPM Not ideal to have 1C agent with no AV nodal drug on board. No known CAD normal EF, ECG intervals and no LVH. Continue flecainide 50 mg bid. She is willing to have PPM. K 4.1 Cr 0.77 Ch Postural Hypotension: stable no need for midodrine at this time   Have spoken with EP to see this am   Janelle Mediate 11/06/2023, 8:37 AM

## 2023-11-06 NOTE — Care Management Obs Status (Signed)
 MEDICARE OBSERVATION STATUS NOTIFICATION   Patient Details  Name: Jeanette [name] MRN: [id number] Date of Birth: May 27, 1935   Medicare Observation Status Notification Given:  Yes    Cosimo Diones, RN 11/06/2023, 3:32 PM

## 2023-11-06 NOTE — Plan of Care (Signed)

## 2023-11-06 NOTE — Progress Notes (Signed)
 ZIO AT applied at hospital Dr. Nelly Laurence to read.

## 2023-11-07 SURGERY — CARDIOVERSION (CATH LAB)
Anesthesia: General

## 2023-11-11 ENCOUNTER — Telehealth: Payer: Self-pay | Admitting: Cardiology

## 2023-11-11 NOTE — Telephone Encounter (Signed)
 Sam with [employer] calling to report critical EKG from heart monitor results

## 2023-11-11 NOTE — Telephone Encounter (Signed)
 Pt. Seen in clinic today for UDS testing.  Upon arrival patient unable to void.  Pt. Reports recent hospitalization for cardiac issues and current diarrhea.  Pt. Prepped with betadine and a 79fr. Catheter was inserted into the bladder without difficulty.  Urine noted to be turbulent with a foul odor.  Cathed specimen positive for blood, Nitrites, and leukocytes.  Cathed specimen sent for culture and UDS re-scheduled for next wee,

## 2023-11-11 NOTE — Telephone Encounter (Signed)
 Sam with [facility] called to report that the pt is being monitored AT and she had bradycardia at 30 this morning at 7:26 am for 60 sec.   I called the pt and her husband says she got up about 30 min ago and that she was still sleeping at the time of the bradycardia 7:26 am.... she is up and feeling well... no lethargy, dizziness.Aaron AasAaron AasAaron AasI advised them we will continue to monitor and for her to keep her 11/18/23 OV with the Afib clinic.

## 2023-11-18 ENCOUNTER — Ambulatory Visit (HOSPITAL_COMMUNITY)
Admission: RE | Admit: 2023-11-18 | Discharge: 2023-11-18 | Disposition: A | Discharge status: Home / Self Care | Source: Ambulatory Visit | Attending: Internal Medicine | Admitting: Internal Medicine

## 2023-11-18 VITALS — BP 170/80 | HR 32 | Ht 63.0 in | Wt 125.8 lb

## 2023-11-18 DIAGNOSIS — I480 Paroxysmal atrial fibrillation: Secondary | ICD-10-CM

## 2023-11-18 DIAGNOSIS — Z79899 Other long term (current) drug therapy: Secondary | ICD-10-CM | POA: Diagnosis not present

## 2023-11-18 DIAGNOSIS — R001 Bradycardia, unspecified: Secondary | ICD-10-CM

## 2023-11-18 DIAGNOSIS — Z7901 Long term (current) use of anticoagulants: Secondary | ICD-10-CM | POA: Diagnosis not present

## 2023-11-18 DIAGNOSIS — I4891 Unspecified atrial fibrillation: Secondary | ICD-10-CM | POA: Diagnosis not present

## 2023-11-18 NOTE — Addendum Note (Signed)
 Encounter addended by: Nathanel Bal, PA-C on: 11/18/2023 3:41 PM  Actions taken: Clinical Note Signed

## 2023-11-18 NOTE — Progress Notes (Addendum)
 Primary Care Physician: Aldo Hun, MD Primary Cardiologist: Cody Das, MD Electrophysiologist: Efraim Grange, MD     Referring Physician: Dr. Napolean Backbone is a 89 y.o. female with a history of HTN, HLD, aortic and coronary atherosclerosis by CT imaging, chronic diastolic CHF, history of CVA, and atrial fibrillation/flutter who presents for consultation in the [facility]. Hospital admission 4/29-30/2025 for repeat Afib with RVR discharged on flecainide  50 mg BID and Toprol  25 mg daily. Recent hospital admission 4/5-01/2024 for same. Patient with history of nausea with Multaq , decline to take amiodarone  due to side effects, and deferred PPM due to recurrent UTIs. She has history of long post conversion termination pauses up to 6.5 seconds. Patient is on Eliquis  2.5 mg BID for a CHADS2VASC score of 6.  On evaluation today, she is currently in marked sinus bradycardia. She feels tired at this time. She denies syncope or presyncope. No missed doses of Eliquis .   Today, she denies symptoms of palpitations, chest pain, shortness of breath, orthopnea, PND, lower extremity edema, dizziness, snoring, daytime somnolence, bleeding, or neurologic sequela. The patient is tolerating medications without difficulties and is otherwise without complaint today.    she has a BMI of Body mass index is 22.28 kg/m.Aaron Aas Filed Weights   11/18/23 1332  Weight: 57.1 kg    Current Outpatient Medications  Medication Sig Dispense Refill   ACETAMINOPHEN  ER PO Take 500 mg by mouth 2 (two) times daily.     apixaban  (ELIQUIS ) 2.5 MG TABS tablet Take 1 tablet (2.5 mg total) by mouth 2 (two) times daily. 180 tablet 3   flecainide  (TAMBOCOR ) 50 MG tablet Take 1 tablet (50 mg total) by mouth every 12 (twelve) hours. 180 tablet 3   fosfomycin (MONUROL) 3 g PACK Take by mouth. Takes 1 dose every 7 days for UTI's     nitroGLYCERIN  (NITROSTAT ) 0.4 MG SL tablet PLACE 1  TABLET (0.4 MG TOTAL) UNDER THE TONGUE EVERY 5 (FIVE) MINUTES AS NEEDED FOR CHEST PAIN. 75 tablet 1   REPATHA SURECLICK 140 MG/ML SOAJ Inject 140 mg into the skin See admin instructions. Take 140mg  SQ every two weeks per patient     VITAMIN D  PO Take 1 tablet by mouth daily.     No current facility-administered medications for this encounter.    Atrial Fibrillation Management history:  Previous antiarrhythmic drugs: Multaq , flecainide  Previous cardioversions: 04/2022 Previous ablations: none Anticoagulation history: Eliquis  2.5 mg BID   ROS- All systems are reviewed and negative except as per the HPI above.  Physical Exam: BP (!) 170/80   Pulse (!) 32   Ht 5\' 3"  (1.6 m)   Wt 57.1 kg   BMI 22.28 kg/m   GEN: Well nourished, well developed in no acute distress NECK: No JVD; No carotid bruits CARDIAC: Regular bradycardic rate and rhythm, no murmurs, rubs, gallops RESPIRATORY:  Clear to auscultation without rales, wheezing or rhonchi  ABDOMEN: Soft, non-tender, non-distended EXTREMITIES:  No edema; No deformity   EKG today demonstrates  Vent. rate 32 BPM PR interval 196 ms QRS duration 76 ms QT/QTcB 514/375 ms P-R-T axes 81 60 46 Marked sinus bradycardia Abnormal ECG When compared with ECG of 06-Nov-2023 05:18, QT has shortened  Echo 10/14/23 demonstrated  1. Left ventricular ejection fraction, by estimation, is 60 to 65%. The  left ventricle has normal function. The left ventricle has no regional  wall motion abnormalities. Left ventricular diastolic parameters were  grossly normal for age (medial e' 7.7  cm/s).   2. Right ventricular systolic function is normal. The right ventricular  size is normal. Tricuspid regurgitation signal is inadequate for assessing  PA pressure.   3. The mitral valve is grossly normal. Trivial mitral valve  regurgitation. No evidence of mitral stenosis.   4. The aortic valve is tricuspid. There is mild calcification of the  aortic valve.  Aortic valve regurgitation is not visualized. No aortic  stenosis is present.   5. The inferior vena cava is normal in size with greater than 50%  respiratory variability, suggesting right atrial pressure of 3 mmHg.   ASSESSMENT & PLAN CHA2DS2-VASc Score = 7  The patient's score is based upon: CHF History: 0 HTN History: 1 Diabetes History: 0 Stroke History: 2 Vascular Disease History: 1 Age Score: 2 Gender Score: 1       ASSESSMENT AND PLAN: Paroxysmal Atrial Fibrillation (ICD10:  I48.0) / atrial flutter The patient's CHA2DS2-VASc score is 7, indicating a 11.2% annual risk of stroke.    She is currently in marked sinus bradycardia. Discussion with EP regarding markedly low HR. Will discontinue metoprolol . I discussed with patient the reasoning behind taking flecainide  with a nodal agent and the risk of continuing flecainide  without taking metoprolol . I discussed with patient consideration for amiodarone  therapy instead of flecainide . She knows brother has neuropathy likely secondary to amiodarone  and does not wish to take it. She wishes to continue the flecainide  despite knowing the risks I've explained taking it without a nodal agent. I discussed ED precautions given markedly low HR today in office. She still has cardiac monitor on chest - I also discussed with patient briefly regarding PPM indication and monitor results to help guide this decision. She is currently on antibiotic for recurrent UTI.   High risk medication monitoring (ICD10: [id number]) Patient requires ongoing monitoring for anti-arrhythmic medication which has the potential to cause life threatening arrhythmias or AV block. ECG intervals are stable. Continue flecainide  50 mg BID.   Secondary hypercoagulable state due to atrial fibrillation Continue Eliquis  2.5 mg BID without interruption.   Follow up as scheduled with EP.    Minnie Amber, PA-C  Afib Clinic [facility] [address] [place],  [place] [address] [phone]

## 2023-11-18 NOTE — Patient Instructions (Signed)
STOP metoprolol

## 2023-12-03 NOTE — Telephone Encounter (Signed)
 Appointment Scheduled/Patient confirmed  Electronically signed by: Rosina Jansky Priddy 12/03/2023 12:35 PM

## 2023-12-05 NOTE — Addendum Note (Signed)
 Encounter addended by: Zephyra Bernardi N on: 12/05/2023 4:25 PM  Actions taken: Imaging Exam ended

## 2023-12-08 DIAGNOSIS — I455 Other specified heart block: Secondary | ICD-10-CM | POA: Diagnosis not present

## 2023-12-08 DIAGNOSIS — I4892 Unspecified atrial flutter: Secondary | ICD-10-CM

## 2023-12-08 DIAGNOSIS — I4891 Unspecified atrial fibrillation: Secondary | ICD-10-CM | POA: Diagnosis not present

## 2023-12-09 ENCOUNTER — Ambulatory Visit: Payer: Self-pay | Admitting: Cardiology

## 2023-12-09 NOTE — Progress Notes (Signed)
 Electrophysiology Office Note:   Date:  12/10/2023  ID:  Jeanette [name], DOB [birth year], MRN [id number]  Primary Cardiologist: Cody Das, MD Electrophysiologist: Efraim Grange, MD      History of Present Illness:   Jeanette [name] is a 89 y.o. female with h/o HTN, HLD, aortic and coronary atherosclerosis by CT imaging, chronic diastolic CHF, history of CVA, and atrial fibrillation/flutter seen today for routine electrophysiology followup.   Hospital admission 4/29-30/2025 for repeat Afib with RVR discharged on flecainide  50 mg BID and Toprol  25 mg daily. Recent hospital admission 4/5-01/2024 for same. Patient with history of nausea with Multaq , decline to take amiodarone  due to side effects, and deferred PPM due to recurrent UTIs. She has history of long post conversion termination pauses up to 6.5 seconds. Patient is on Eliquis  2.5 mg BID for a CHADS2VASC score of 6.   Seen in AF clinic 5/12 and noted to be in marked sinus bradycardia. Metoprolol  stopped, kept on flecainide .  Since last being seen in our clinic the patient reports doing OK. She has some fatigue today, but is in sinus and has otherwise been feeling OK. Continues to wish to avoid amiodarone , and to avoid pacing of possible. Open to idea if felt it will improve her functional status.  Denies chest pain, edema, or syncope.   Review of systems complete and found to be negative unless listed in HPI.   EP Information / Studies Reviewed:    EKG is ordered today. Personal review as below.  EKG Interpretation Date/Time:  Tuesday December 10 2023 11:06:29 EDT Ventricular Rate:  83 PR Interval:  180 QRS Duration:  78 QT Interval:  428 QTC Calculation: 502 R Axis:   61  Text Interpretation: Sinus rhythm with frequent Premature ventricular complexes Prolonged QT When compared with ECG of 18-Nov-2023 13:48, Premature ventricular complexes are now Present Vent. rate has increased BY  51 BPM Nonspecific T wave abnormality has  replaced inverted T waves in Anterior leads QT has lengthened Confirmed by Pilar Bridge [id number]) on 12/10/2023 1:04:15 PM    Arrhythmia/Device History Zio patch monitor 14 days 11/06/2023 - 11/20/2023: Dominant rhythm: Sinus. HR 54-10 bpm. Avg HR 50 bpm, in sinus rhythm. 724 episodes of SVT/atrial tachycardia, fastest at 185 bpm for 19 min 58 secs, longest for 3 hr 13 mins at 130 bpm. No clear symptoms reported with SVT/atrial tachycardia. 18% isolated SVE, <1% couplet/triplets. 0 episodes of VT. <1% isolated VE, no couplet/triplets. No atrial fibrillation/atrial flutter/VT/high grade AV block, sinus pause >3sec noted. 2 patient triggered events, correlated w/SVE.  Physical Exam:   VS:  BP 110/70   Pulse 68   Ht 5\' 2"  (1.575 m)   Wt 124 lb 3.2 oz (56.3 kg)   SpO2 99%   BMI 22.72 kg/m    Wt Readings from Last 3 Encounters:  12/10/23 124 lb 3.2 oz (56.3 kg)  11/18/23 125 lb 12.8 oz (57.1 kg)  11/06/23 121 lb 4.8 oz (55 kg)     GEN: No acute distress NECK: No JVD; No carotid bruits CARDIAC: Regular rate and rhythm with occasional ectopy, no murmurs, rubs, gallops RESPIRATORY:  Clear to auscultation without rales, wheezing or rhonchi  ABDOMEN: Soft, non-tender, non-distended EXTREMITIES:  No edema; No deformity   ASSESSMENT AND PLAN:    Paroxysmal AF EKG today shows NSR with PACs and occasional PVCs Continue eliquis  2.5 mg BID Continue flecainide  50 mg BID  Pt refuses amiodarone  given brother with h/o neuropathy on same.  Conduction has improved off metoprolol .   Unclear if pacing would help improve her functional status. She has some fatigue today in NSR.  She would prefer to avoid pacing if possible.   Secondary hypercoagulable state Pt on Eliquis  as above   Frequent UTIs Had ABx 10 times last year.    Follow up with Dr. Lawana Pray in 2-3 months  Signed, Tylene Galla, PA-C

## 2023-12-09 NOTE — Progress Notes (Signed)
 Jeanette [name], she is seeing you tomorrow  Thanks [employer]

## 2023-12-10 ENCOUNTER — Encounter: Payer: Self-pay | Admitting: Student

## 2023-12-10 ENCOUNTER — Ambulatory Visit: Attending: Student | Admitting: Student

## 2023-12-10 VITALS — BP 110/70 | HR 68 | Ht 62.0 in | Wt 124.2 lb

## 2023-12-10 DIAGNOSIS — I480 Paroxysmal atrial fibrillation: Secondary | ICD-10-CM

## 2023-12-10 DIAGNOSIS — I4891 Unspecified atrial fibrillation: Secondary | ICD-10-CM | POA: Diagnosis not present

## 2023-12-10 DIAGNOSIS — Z7901 Long term (current) use of anticoagulants: Secondary | ICD-10-CM

## 2023-12-10 NOTE — Patient Instructions (Signed)
 Medication Instructions:  No medication changes today. *If you need a refill on your cardiac medications before your next appointment, please call your pharmacy*  Lab Work: No labwork ordered today. If you have labs (blood work) drawn today and your tests are completely normal, you will receive your results only by: MyChart Message (if you have MyChart) OR A paper copy in the mail If you have any lab test that is abnormal or we need to change your treatment, we will call you to review the results.  Testing/Procedures: No testing ordered today  Follow-Up: At [facility], you and your health needs are our priority.  As part of our continuing mission to provide you with exceptional heart care, our providers are all part of one team.  This team includes your primary Cardiologist (physician) and Advanced Practice Providers or APPs (Physician Assistants and Nurse Practitioners) who all work together to provide you with the care you need, when you need it.  Your next appointment:   3 month(s)  Provider:   You may see Efraim Grange, MD or one of the following Advanced Practice Providers on your designated Care Team:   Mertha Abrahams, [place] [address] [place], PA-C Suzann Riddle, NP Creighton Doffing, NP    We recommend signing up for the patient portal called "MyChart".  Sign up information is provided on this After Visit Summary.  MyChart is used to connect with patients for Virtual Visits (Telemedicine).  Patients are able to view lab/test results, encounter notes, upcoming appointments, etc.  Non-urgent messages can be sent to your provider as well.   To learn more about what you can do with MyChart, go to [email or web address].

## 2024-02-06 ENCOUNTER — Emergency Department (HOSPITAL_COMMUNITY)
Admission: EM | Admit: 2024-02-06 | Discharge: 2024-02-06 | Disposition: A | Discharge status: Home / Self Care | Attending: Emergency Medicine | Admitting: Emergency Medicine

## 2024-02-06 ENCOUNTER — Encounter (HOSPITAL_COMMUNITY): Payer: Self-pay | Admitting: *Deleted

## 2024-02-06 ENCOUNTER — Other Ambulatory Visit: Payer: Self-pay

## 2024-02-06 DIAGNOSIS — R0789 Other chest pain: Secondary | ICD-10-CM | POA: Diagnosis present

## 2024-02-06 DIAGNOSIS — Z7901 Long term (current) use of anticoagulants: Secondary | ICD-10-CM | POA: Diagnosis not present

## 2024-02-06 DIAGNOSIS — R002 Palpitations: Secondary | ICD-10-CM | POA: Insufficient documentation

## 2024-02-06 LAB — CBC
HCT: 41.8 % (ref 36.0–46.0)
Hemoglobin: 13.1 g/dL (ref 12.0–15.0)
MCH: 30.3 pg (ref 26.0–34.0)
MCHC: 31.3 g/dL (ref 30.0–36.0)
MCV: 96.5 fL (ref 80.0–100.0)
Platelets: 222 K/uL (ref 150–400)
RBC: 4.33 MIL/uL (ref 3.87–5.11)
RDW: 13.7 % (ref 11.5–15.5)
WBC: 5.5 K/uL (ref 4.0–10.5)
nRBC: 0 % (ref 0.0–0.2)

## 2024-02-06 LAB — BASIC METABOLIC PANEL WITH GFR
Anion gap: 11 (ref 5–15)
BUN: 10 mg/dL (ref 8–23)
CO2: 21 mmol/L — ABNORMAL LOW (ref 22–32)
Calcium: 10.6 mg/dL — ABNORMAL HIGH (ref 8.9–10.3)
Chloride: 105 mmol/L (ref 98–111)
Creatinine, Ser: 0.8 mg/dL (ref 0.44–1.00)
GFR, Estimated: >60 mL/min (ref >60)
Glucose, Bld: 108 mg/dL — ABNORMAL HIGH (ref 70–99)
Potassium: 3.8 mmol/L (ref 3.5–5.1)
Sodium: 137 mmol/L (ref 135–145)

## 2024-02-06 LAB — TROPONIN I (HIGH SENSITIVITY)
Troponin I (High Sensitivity): 5 ng/L (ref <18)
Troponin I (High Sensitivity): 5 ng/L (ref <18)

## 2024-02-06 NOTE — ED Provider Notes (Signed)
 [place] EMERGENCY DEPARTMENT AT [facility] Provider Note   CSN: [id number] Arrival date & time: 02/06/24  0037     Patient presents with: Irregular Heart Beat   Jeanette [name] is a 89 y.o. female.   Patient presents to the emergency department for evaluation of slight chest discomfort with rapid heart rate.  Patient reports a history of paroxysmal atrial fibrillation.  Patient is on Eliquis .  She reports that she recently had her metoprolol  stopped because of episodes of bradycardia.  Patient noted that her heart rate was fast, used her blood pressure monitor to see that her blood pressure was in the 140s.  She did take the metoprolol  and symptoms have now resolved.       Prior to Admission medications   Medication Sig Start Date End Date Taking? Authorizing Provider  ACETAMINOPHEN  ER PO Take 500 mg by mouth 2 (two) times daily.    [provider]  apixaban  (ELIQUIS ) 2.5 MG TABS tablet Take 1 tablet (2.5 mg total) by mouth 2 (two) times daily. 11/06/23   [facility], Jon Garre, PA  flecainide  (TAMBOCOR ) 50 MG tablet Take 1 tablet (50 mg total) by mouth every 12 (twelve) hours. 11/06/23   [facility], Jon Garre, PA  fosfomycin (MONUROL) 3 g PACK Take by mouth. Takes 1 dose every 7 days for UTI's 11/16/23   [provider]  nitroGLYCERIN  (NITROSTAT ) 0.4 MG SL tablet PLACE 1 TABLET (0.4 MG TOTAL) UNDER THE TONGUE EVERY 5 (FIVE) MINUTES AS NEEDED FOR CHEST PAIN. 07/13/19 11/18/23  Patwardhan, Newman PARAS, MD  REPATHA SURECLICK 140 MG/ML SOAJ Inject 140 mg into the skin See admin instructions. Take 140mg  SQ every two weeks per patient 10/12/19   [provider]  VITAMIN D  PO Take 1 tablet by mouth daily.    [provider]    Allergies: Epinephrine , Cozaar [losartan], Crestor [rosuvastatin], Ezetimibe, Galantamine hydrobromide, Iron, Pregabalin, Septra [sulfamethoxazole-trimethoprim], Vytorin [ezetimibe-simvastatin], Benazepril hcl, Crestor [rosuvastatin  calcium ], and Lisinopril    Review of Systems  Updated Vital Signs BP (!) 144/67   Pulse (!) 58   Temp 97.7 F (36.5 C)   Resp 15   Ht 5' 2 (1.575 m)   Wt 56.3 kg   SpO2 100%   BMI 22.70 kg/m   Physical Exam Vitals and nursing note reviewed.  Constitutional:      General: She is not in acute distress.    Appearance: She is well-developed.  HENT:     Head: Normocephalic and atraumatic.     Mouth/Throat:     Mouth: Mucous membranes are moist.  Eyes:     General: Vision grossly intact. Gaze aligned appropriately.     Extraocular Movements: Extraocular movements intact.     Conjunctiva/sclera: Conjunctivae normal.  Cardiovascular:     Rate and Rhythm: Normal rate and regular rhythm.     Pulses: Normal pulses.     Heart sounds: Normal heart sounds, S1 normal and S2 normal. No murmur heard.    No friction rub. No gallop.  Pulmonary:     Effort: Pulmonary effort is normal. No respiratory distress.     Breath sounds: Normal breath sounds.  Abdominal:     General: Bowel sounds are normal.     Palpations: Abdomen is soft.     Tenderness: There is no abdominal tenderness. There is no guarding or rebound.     Hernia: No hernia is present.  Musculoskeletal:        General: No swelling.  Cervical back: Full passive range of motion without pain, normal range of motion and neck supple. No spinous process tenderness or muscular tenderness. Normal range of motion.     Right lower leg: No edema.     Left lower leg: No edema.  Skin:    General: Skin is warm and dry.     Capillary Refill: Capillary refill takes less than 2 seconds.     Findings: No ecchymosis, erythema, rash or wound.  Neurological:     General: No focal deficit present.     Mental Status: She is alert and oriented to person, place, and time.     GCS: GCS eye subscore is 4. GCS verbal subscore is 5. GCS motor subscore is 6.     Cranial Nerves: Cranial nerves 2-12 are intact.     Sensory: Sensation is intact.      Motor: Motor function is intact.     Coordination: Coordination is intact.  Psychiatric:        Attention and Perception: Attention normal.        Mood and Affect: Mood normal.        Speech: Speech normal.        Behavior: Behavior normal.     (all labs ordered are listed, but only abnormal results are displayed) Labs Reviewed  BASIC METABOLIC PANEL WITH GFR - Abnormal; Notable for the following components:      Result Value   CO2 21 (*)    Glucose, Bld 108 (*)    Calcium  10.6 (*)    All other components within normal limits  CBC  TROPONIN I (HIGH SENSITIVITY)  TROPONIN I (HIGH SENSITIVITY)    EKG: EKG Interpretation Date/Time:  Thursday February 06 2024 00:47:17 EDT Ventricular Rate:  82 PR Interval:  156 QRS Duration:  70 QT Interval:  370 QTC Calculation: 432 R Axis:   72  Text Interpretation: Sinus rhythm with marked sinus arrhythmia Otherwise normal ECG When compared with ECG of 10-Dec-2023 11:06, No significant change since last tracing Confirmed by Haze Lonni PARAS [phone]) on 02/06/2024 1:20:20 AM  Radiology: No results found.   Procedures   Medications Ordered in the ED - No data to display                                  Medical Decision Making  Differential Diagnosis considered includes, but not limited to: STEMI; NSTEMI; myocarditis; pericarditis; pulmonary embolism; aortic dissection; pneumothorax; pneumonia; arrhythmia  Presents to the department with slight chest discomfort in the setting of rapid heartbeat.  Patient does have a history of paroxysmal atrial fibrillation, is anticoagulated on Eliquis .  Patient recently had a 2-week Zio patch placed and reviewed revealed multiple episodes of atrial tachycardia but no evidence of A-fib.  Patient is currently off of her metoprolol  because of a history of bradycardia.  She did take her metoprolol  at home when she had symptoms and at arrival to the ED she is in sinus rhythm.  No arrhythmias noted here  in the ED.  Troponin negative x 2.  At this point patient is stable, appears well.  She does not require hospitalization.  Can follow-up with cardiology to determine if she should stay off of the metoprolol .  She did drop into the 50s here in the ED, likely secondary to her taking a dose tonight.     Final diagnoses:  Palpitations    ED Discharge Orders  None          Haze Lonni PARAS, MD 02/06/24 [phone]

## 2024-02-06 NOTE — ED Notes (Signed)
 Blue top sent down to lab.

## 2024-02-06 NOTE — ED Triage Notes (Addendum)
 Approx 1730 she experienced chest pressure with a rapid heart rate  140  hx of af she    she has been taken off her af meds  but she took a metoprolol    tonight which may have slowed down her heart rate  she no longer has chest pressure

## 2024-02-13 ENCOUNTER — Telehealth: Payer: Self-pay | Admitting: Cardiology

## 2024-02-13 NOTE — Telephone Encounter (Signed)
 PCP office calling to get pt seen sooner. She has had multiple ER visits, HR 140. Pt is not taking her medications consistently. Please advise

## 2024-02-13 NOTE — Telephone Encounter (Signed)
 Lori at patients PCP office called since I hadn't had a chance to return call. Patient moved up to 8/19 at 930am. Katheryn made aware this is all that Dr. Deno has.

## 2024-02-23 NOTE — Progress Notes (Unsigned)
  Electrophysiology Office Note:   Date:  02/25/2024  ID:  Jeanette [name], DOB 11-08-1934, MRN [id number]  Primary Cardiologist: Newman JINNY Lawrence, MD Primary Heart Failure: None Electrophysiologist: Eulas FORBES Furbish, MD      History of Present Illness:   Jeanette [name] is a 89 y.o. female with h/o atrial fibrillation and postconversion pauses, Mobitz 1 AV block seen today for routine electrophysiology followup.   Since last being seen in our clinic the patient reports doing overall well.  She has had some bradycardia.  Her metoprolol  was stopped.  She did present to the hospital with palpitations as well.  She has atrial flutter as well as atrial fibrillation.  she denies chest pain, palpitations, dyspnea, PND, orthopnea, nausea, vomiting, dizziness, syncope, edema, weight gain, or early satiety.   Review of systems complete and found to be negative unless listed in HPI.   EP Information / Studies Reviewed:    EKG is not ordered today. EKG from 02/06/2024 reviewed which showed sinus rhythm with PACs        Risk Assessment/Calculations:    CHA2DS2-VASc Score = 7   This indicates a 11.2% annual risk of stroke. The patient's score is based upon: CHF History: 0 HTN History: 1 Diabetes History: 0 Stroke History: 2 Vascular Disease History: 1 Age Score: 2 Gender Score: 1            Physical Exam:   VS:  BP 118/60 (BP Location: Right Arm, Patient Position: Sitting, Cuff Size: Normal)   Pulse 74   Ht 5' 2 (1.575 m)   Wt 124 lb (56.2 kg)   SpO2 97%   BMI 22.68 kg/m    Wt Readings from Last 3 Encounters:  02/25/24 124 lb (56.2 kg)  02/06/24 124 lb 1.9 oz (56.3 kg)  12/10/23 124 lb 3.2 oz (56.3 kg)     GEN: Well nourished, well developed in no acute distress NECK: No JVD; No carotid bruits CARDIAC: Regular rate and rhythm, no murmurs, rubs, gallops RESPIRATORY:  Clear to auscultation without rales, wheezing or rhonchi  ABDOMEN: Soft, non-tender,  non-distended EXTREMITIES:  No edema; No deformity   ASSESSMENT AND PLAN:    1.  Paroxysmal atrial fibrillation: On flecainide .  She is continue to have episodic palpitations.  She cannot take any rate controlling medications due to 2-1 AV block.  She had side effects on Multaq  and does not want to take amiodarone .  As she is continue to have symptoms on flecainide , Jeanette [name] switch to propafenone  225 mg twice daily.  She can follow-up in atrial fibrillation clinic.  Hopefully we can avoid pacemaker implant.  If she needs 1, would need leadless.  If she wishes to continue with medications, dofetilide would be her next option.  2.  Secondary hypercoagulable state: On Eliquis   3.  Mobitz 1 AV block second-degree: Trying to avoid pacemaker implant.  She would be a candidate for leadless pacemaker due to her history of urinary tract infections and incontinence.  Follow up with Afib Clinic in 3 months  Signed, Johanny Segers Gladis Norton, MD

## 2024-02-25 ENCOUNTER — Encounter: Payer: Self-pay | Admitting: Cardiology

## 2024-02-25 ENCOUNTER — Ambulatory Visit: Attending: Cardiology | Admitting: Cardiology

## 2024-02-25 VITALS — BP 118/60 | HR 74 | Ht 62.0 in | Wt 124.0 lb

## 2024-02-25 DIAGNOSIS — I480 Paroxysmal atrial fibrillation: Secondary | ICD-10-CM | POA: Diagnosis not present

## 2024-02-25 DIAGNOSIS — D6869 Other thrombophilia: Secondary | ICD-10-CM

## 2024-02-25 DIAGNOSIS — I441 Atrioventricular block, second degree: Secondary | ICD-10-CM

## 2024-02-25 MED ORDER — PROPAFENONE HCL ER 225 MG PO CP12: 225.0000 mg | ORAL_CAPSULE | Freq: Two times a day (BID) | ORAL | 3 refills | Status: DC

## 2024-02-25 NOTE — Patient Instructions (Addendum)
 Medication Instructions:  Your physician has recommended you make the following change in your medication:  STOP Flecainide  START Propafenone  225 mg twice a day  *If you need a refill on your cardiac medications before your next appointment, please call your pharmacy*  Lab Work: None ordered  Testing/Procedures: None ordered  Follow-Up: At [facility], you and your health needs are our priority.  As part of our continuing mission to provide you with exceptional heart care, our providers are all part of one team.  This team includes your primary Cardiologist (physician) and Advanced Practice Providers or APPs (Physician Assistants and Nurse Practitioners) who all work together to provide you with the care you need, when you need it.  Your next appointment:   3 month(s)  Provider:   You will follow up in the Atrial Fibrillation Clinic located at [facility]. Your provider will be: Clint R. Fenton, PA-C or Fairy Heinrich, PA-C     Thank you for choosing [employer]!!   Maeola Domino, RN [phone]   Other Instructions  Propafenone  Tablets What is this medication? PROPAFENONE  (proe pa FEEN one) prevents and treats a fast or irregular heartbeat (arrhythmia). It is often used to treat a type of arrhythmia known as AFib (atrial fibrillation). It works by slowing down overactive electric signals in the heart, which stabilizes your heart rhythm. It belongs to a group of medications called antiarrhythmics. This medicine may be used for other purposes; ask your health care provider or pharmacist if you have questions. COMMON BRAND NAME(S): Rythmol  What should I tell my care team before I take this medication? They need to know if you have any of these conditions: Brugada syndrome Have had a heart attack Heart failure High or low levels of electrolytes, such as magnesium , potassium, or sodium in your blood Kidney disease Liver disease Low blood pressure Lung  or breathing disease, such as asthma or COPD Pacemaker Slow heartbeat An unusual or allergic reaction to propafenone , other medications, foods, dyes, or preservatives Pregnant or trying to get pregnant Breastfeeding How should I use this medication? Take this medication by mouth with water. Take it as directed on the prescription label at the same time every day. You can take it with or without food. If it upsets your stomach, take it with food. Keep taking it unless your care team tells you to stop. Talk to your care team about the use of this medication in children. Special care may be needed. Overdosage: If you think you have taken too much of this medicine contact a poison control center or emergency room at once. NOTE: This medicine is only for you. Do not share this medicine with others. What if I miss a dose? If you miss a dose, take it as soon as you can. If it is almost time for your next dose, take only that dose. Do not take double or extra doses. What may interact with this medication? Do not take this medication with any of the following: Certain medications for fungal infections, such as fluconazole, ketoconazole, posaconazole Certain medications for irregular heartbeat, such as dronedarone  Cisapride Idelalisib Nirmatrelvir; ritonavir Pimozide Saquinavir Thioridazine Tipranavir This medication may also interact with the following: Beta blockers, such as propranolol Digoxin  Grapefruit juice Orlistat Warfarin Other medications may affect the way this medication works. Talk with your care team about all the medications you take. They may suggest changes to your treatment plan to lower the risk of side effects and to make sure your  medications work as intended. This list may not describe all possible interactions. Give your health care provider a list of all the medicines, herbs, non-prescription drugs, or dietary supplements you use. Also tell them if you smoke, drink  alcohol, or use illegal drugs. Some items may interact with your medicine. What should I watch for while using this medication? Your condition will be monitored closely when you first begin therapy. Often, this medication is first started in a hospital or other monitored health care setting. Once you are on maintenance therapy, visit your care team for regular checks on your progress. Because your condition and use of this medication carry some risk, it is a good idea to carry an identification card, necklace or bracelet with details of your condition, medications, and care team. This medication may affect your coordination, reaction time, or judgment. Do not drive or operate machinery until you know how this medication affects you. Sit up or stand slowly to reduce the risk of dizzy or fainting spells. Drinking alcohol with this medication can increase the risk of these side effects. If you are going to have surgery, tell your care team that you are taking this medication. What side effects may I notice from receiving this medication? Side effects that you should report to your care team as soon as possible: Allergic reactions--skin rash, itching, hives, swelling of the face, lips, tongue, or throat Heart failure--shortness of breath, swelling of the ankles, feet, or hands, sudden weight gain, unusual weakness or fatigue Heart rhythm changes--fast or irregular heartbeat, dizziness, feeling faint or lightheaded, chest pain, trouble breathing Infection--fever, chills, cough, sore throat Unusual bruising or bleeding Side effects that usually do not require medical attention (report to your care team if they continue or are bothersome): Change in taste Constipation Dizziness Fatigue Nausea This list may not describe all possible side effects. Call your doctor for medical advice about side effects. You may report side effects to FDA at 1-800-FDA-1088. Where should I keep my medication? Keep out of the  reach of children and pets. Store at room temperature between 15 and 30 degrees C (59 and 86 degrees F). Protect from light. Keep container tightly closed. Throw away any unused medication after the expiration date. NOTE: This sheet is a summary. It may not cover all possible information. If you have questions about this medicine, talk to your doctor, pharmacist, or health care provider.  2024 Elsevier/Gold Standard (2022-12-27 00:00:00)

## 2024-03-10 ENCOUNTER — Ambulatory Visit: Admitting: Cardiology

## 2024-03-15 ENCOUNTER — Other Ambulatory Visit: Payer: Self-pay

## 2024-03-15 ENCOUNTER — Encounter (HOSPITAL_COMMUNITY): Payer: Self-pay | Admitting: Emergency Medicine

## 2024-03-15 ENCOUNTER — Emergency Department (HOSPITAL_COMMUNITY)
Admission: EM | Admit: 2024-03-15 | Discharge: 2024-03-15 | Disposition: A | Discharge status: Home / Self Care | Attending: Emergency Medicine | Admitting: Emergency Medicine

## 2024-03-15 ENCOUNTER — Emergency Department (HOSPITAL_COMMUNITY)

## 2024-03-15 DIAGNOSIS — I4891 Unspecified atrial fibrillation: Secondary | ICD-10-CM | POA: Diagnosis not present

## 2024-03-15 DIAGNOSIS — Z7901 Long term (current) use of anticoagulants: Secondary | ICD-10-CM | POA: Insufficient documentation

## 2024-03-15 DIAGNOSIS — R0789 Other chest pain: Secondary | ICD-10-CM | POA: Insufficient documentation

## 2024-03-15 DIAGNOSIS — Z853 Personal history of malignant neoplasm of breast: Secondary | ICD-10-CM | POA: Insufficient documentation

## 2024-03-15 LAB — BASIC METABOLIC PANEL WITH GFR
Anion gap: 10 (ref 5–15)
BUN: 11 mg/dL (ref 8–23)
CO2: 25 mmol/L (ref 22–32)
Calcium: 10.6 mg/dL — ABNORMAL HIGH (ref 8.9–10.3)
Chloride: 103 mmol/L (ref 98–111)
Creatinine, Ser: 0.73 mg/dL (ref 0.44–1.00)
GFR, Estimated: >60 mL/min (ref >60)
Glucose, Bld: 109 mg/dL — ABNORMAL HIGH (ref 70–99)
Potassium: 3.8 mmol/L (ref 3.5–5.1)
Sodium: 138 mmol/L (ref 135–145)

## 2024-03-15 LAB — I-STAT CHEM 8, ED
BUN: 13 mg/dL (ref 8–23)
Calcium, Ion: 1.37 mmol/L (ref 1.15–1.40)
Chloride: 106 mmol/L (ref 98–111)
Creatinine, Ser: 0.8 mg/dL (ref 0.44–1.00)
Glucose, Bld: 111 mg/dL — ABNORMAL HIGH (ref 70–99)
HCT: 40 % (ref 36.0–46.0)
Hemoglobin: 13.6 g/dL (ref 12.0–15.0)
Potassium: 3.9 mmol/L (ref 3.5–5.1)
Sodium: 140 mmol/L (ref 135–145)
TCO2: 23 mmol/L (ref 22–32)

## 2024-03-15 LAB — TROPONIN I (HIGH SENSITIVITY)
Troponin I (High Sensitivity): 5 ng/L (ref <18)
Troponin I (High Sensitivity): 4 ng/L (ref <18)

## 2024-03-15 LAB — CBC
HCT: 41.1 % (ref 36.0–46.0)
Hemoglobin: 12.8 g/dL (ref 12.0–15.0)
MCH: 30.8 pg (ref 26.0–34.0)
MCHC: 31.1 g/dL (ref 30.0–36.0)
MCV: 98.8 fL (ref 80.0–100.0)
Platelets: 231 K/uL (ref 150–400)
RBC: 4.16 MIL/uL (ref 3.87–5.11)
RDW: 13.6 % (ref 11.5–15.5)
WBC: 4.4 K/uL (ref 4.0–10.5)
nRBC: 0 % (ref 0.0–0.2)

## 2024-03-15 MED ORDER — FOSFOMYCIN TROMETHAMINE 3 G PO PACK
3.0000 g | PACK | Freq: Once | ORAL | Status: AC
  Administered 2024-03-15: 3 g via ORAL
  Filled 2024-03-15: qty 3

## 2024-03-15 NOTE — ED Triage Notes (Signed)
 Pt states sob and chest pain since this am.  Hr 100 - 114 in triage.

## 2024-03-15 NOTE — Discharge Instructions (Signed)
 It does not appear that you were having a heart attack today based on your lab evaluations. Your heart rate was slightly elevated earlier which may have been due to some anxiety however there are no life-threatening arrhythmias noted. You were treated in the emergency department with fosfomycin based on review of your recent culture and sensitivity reports from your urine.  If you need further treatment you will need to touch base with Dr. Kathrine. Your caregiver has diagnosed you as having chest pain that is not specific for one problem, but does not require admission.  You are at low risk for an acute heart condition or other serious illness. Chest pain comes from many different causes.  SEEK IMMEDIATE MEDICAL ATTENTION IF: You have severe chest pain, especially if the pain is crushing or pressure-like and spreads to the arms, back, neck, or jaw, or if you have sweating, nausea (feeling sick to your stomach), or shortness of breath. THIS IS AN EMERGENCY. Don't wait to see if the pain will go away. Get medical help at once. Call 911 or 0 (operator). DO NOT drive yourself to the hospital.  Your chest pain gets worse and does not go away with rest.  You have an attack of chest pain lasting longer than usual, despite rest and treatment with the medications your caregiver has prescribed.  You wake from sleep with chest pain or shortness of breath.  You feel dizzy or faint.  You have chest pain not typical of your usual pain for which you originally saw your caregiver.

## 2024-03-15 NOTE — ED Provider Notes (Signed)
 I provided a substantive portion of the care of this patient.  I personally made/approved the management plan for this patient and take responsibility for the patient management.  EKG Interpretation Date/Time:  Sunday March 15 2024 14:49:25 EDT Ventricular Rate:  109 PR Interval:  166 QRS Duration:  72 QT Interval:  330 QTC Calculation: 444 R Axis:   77  Text Interpretation: Sinus tachycardia Nonspecific T wave abnormality Abnormal ECG When compared with ECG of 06-Feb-2024 00:47, Since last tracing rate faster Confirmed by Randol Simmonds [phone]) on 03/15/2024 5:15:29 PM Patient presented to the ED with complaints of shortness of breath and chest pain today.  Patient also noted at home that her heart rate was increased.  Initial ED workup reassuring.  Initial troponin normal.  X-ray without signs of pneumonia.  Low suspicion for PE at this time patient is on anticoagulation.  Will plan on delta troponin   Randol Simmonds, MD 03/15/24 [phone]

## 2024-03-15 NOTE — ED Provider Notes (Signed)
 [place] EMERGENCY DEPARTMENT AT [facility] Provider Note   CSN: [id number] Arrival date & time: 03/15/24  1434     Patient presents with: Shortness of Breath and Chest Pain   Jeanette [name] is a 89 y.o. female who presents for sob and chest discomfort.  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  Review of outside records also shows a history of dementia per PCP History is gathered by the patient and her husband at bedside who are both very poor historians. Patient reports when she woke up this morning around 7 AM she felt short of breath.  She is under unable to qualify what she means by feeling short of breath states that it lasted about 15 minutes.  She got up and felt better had breakfast and around 10 AM had some dyspepsia and nausea which lasted about 45 minutes and then also resolved.  Around 27 AM she told her husband that she thought she might be having a heart attack so he took her vital signs she was noted to have a racing heart and high blood pressure so she came here for further evaluation.  She states that she was not having palpitations and it was what it feels like when she goes into A-fib with RVR.  She is chronically anticoagulated.  All of her symptoms have resolved at this time. Further the husband reports that we were called several days ago by Dr. Harless and told that she needed to go to [place] long to get IV antibiotics for her UTI.  She does have chronic dysuria.  She denies fevers chills or flank pain     Shortness of Breath Associated symptoms:  chest pain   Chest Pain Associated symptoms: shortness of breath        Prior to Admission medications   Medication Sig Start Date End Date Taking? Authorizing Provider  ACETAMINOPHEN  ER PO Take 500 mg by mouth 2 (two) times daily.    [provider]  apixaban  (ELIQUIS ) 2.5 MG TABS tablet Take 1 tablet (2.5 mg total) by mouth 2 (two) times daily. 11/06/23   Duke, Jon Garre, PA  escitalopram  (LEXAPRO ) 10 MG tablet Take 10 mg by mouth daily.    [provider]  fosfomycin (MONUROL ) 3 g PACK Take by mouth. Takes 1 dose every 7 days for UTI's 11/16/23   [provider]  nitroGLYCERIN  (NITROSTAT ) 0.4 MG SL tablet PLACE 1 TABLET (0.4 MG TOTAL) UNDER THE TONGUE EVERY 5 (FIVE) MINUTES AS NEEDED FOR CHEST PAIN. 07/13/19 02/25/24  Patwardhan, Newman PARAS, MD  propafenone  (RYTHMOL  SR) 225 MG 12 hr capsule Take 1 capsule (225 mg total) by mouth 2 (two) times daily. 02/25/24   Camnitz, Will Gladis, MD  REPATHA SURECLICK 140 MG/ML SOAJ Inject 140 mg into the skin See admin instructions. Take 140mg  SQ every two weeks per patient 10/12/19   [provider]  VITAMIN D  PO Take 1 tablet by mouth daily.    [provider]    Allergies: Epinephrine , Cozaar [losartan], Crestor [rosuvastatin], Ezetimibe, Galantamine hydrobromide, Iron, Pregabalin, Septra [sulfamethoxazole-trimethoprim], Vytorin [ezetimibe-simvastatin], Benazepril hcl, Crestor [rosuvastatin calcium ], and Lisinopril    Review of Systems  Respiratory:  Positive  for shortness of breath.   Cardiovascular:  Positive for chest pain.    Updated Vital Signs There were no vitals taken for this visit.  Physical Exam Vitals and nursing note reviewed.  Constitutional:      General: She is not in acute distress.    Appearance: She is well-developed. She is not diaphoretic.  HENT:     Head: Normocephalic and atraumatic.     Right Ear: External ear normal.     Left Ear: External ear normal.     Nose: Nose normal.      Mouth/Throat:     Mouth: Mucous membranes are moist.  Eyes:     General: No scleral icterus.    Conjunctiva/sclera: Conjunctivae normal.  Cardiovascular:     Rate and Rhythm: Normal rate and regular rhythm.     Heart sounds: Normal heart sounds. No murmur heard.    No friction rub. No gallop.  Pulmonary:     Effort: Pulmonary effort is normal. No respiratory distress.     Breath sounds: Normal breath sounds.  Abdominal:     General: Bowel sounds are normal. There is no distension.     Palpations: Abdomen is soft. There is no mass.     Tenderness: There is no abdominal tenderness. There is no guarding.  Musculoskeletal:     Cervical back: Normal range of motion.     Right lower leg: No edema.     Left lower leg: No edema.  Skin:    General: Skin is warm and dry.  Neurological:     Mental Status: She is alert and oriented to person, place, and time.  Psychiatric:        Behavior: Behavior normal.     (all labs ordered are listed, but only abnormal results are displayed) Labs Reviewed  I-STAT CHEM 8, ED - Abnormal; Notable for the following components:      Result Value   Glucose, Bld 111 (*)    All other components within normal limits  CBC  BASIC METABOLIC PANEL WITH GFR  TROPONIN I (HIGH SENSITIVITY)  TROPONIN I (HIGH SENSITIVITY)    EKG: None  Radiology: DG Chest 2 View Result Date: 03/15/2024 CLINICAL DATA:  Chest pain and shortness of breath EXAM: CHEST - 2 VIEW COMPARISON:  Chest radiograph dated 11/05/2023 FINDINGS: Normal lung volumes. Left basilar linear opacity. No pleural effusion or pneumothorax. The heart size and mediastinal contours are within normal limits. No acute osseous abnormality. IMPRESSION: Left basilar linear opacity, likely atelectasis. Electronically Signed   By: Limin  Xu M.D.   On: 03/15/2024 15:40     Procedures   Medications Ordered in the ED - No data to display  Clinical Course as of 03/15/24 1717  Sun Mar 15, 2024  1716  Consulted with ID pharmacist- bacteria appears to be susceptible to fosfomycin [AH]    Clinical Course User Index [AH] Arloa Chroman, PA-C                                 Medical Decision Making Amount and/or Complexity of Data Reviewed Labs: ordered. Radiology: ordered.  Risk Prescription drug management.   This patient presents to the ED for concern of chest pain / sob, this involves an extensive number of treatment options, and is a complaint that carries with it a high risk of complications and morbidity.  The emergent differential diagnosis of chest pain includes: Acute coronary syndrome,  pericarditis, aortic dissection, pulmonary embolism, tension pneumothorax, pneumonia, and esophageal rupture.   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   Social Determinants of Health:   SDOH Screenings   Food Insecurity: No Food Insecurity (11/05/2023)  Housing: Low Risk  (11/05/2023)  Transportation Needs: No Transportation Needs (11/05/2023)  Utilities: Not At Risk (11/05/2023)  Social Connections: Socially Integrated (11/05/2023)  Tobacco Use: Low Risk  (03/15/2024)     Additional history:  {Additional history obtained from husband {External records from outside source obtained and reviewed including outside culture and sensitivity reports from [facility]  Lab Tests:  I Ordered, and personally interpreted labs.  The pertinent results include:   Labs reviewed she has mildly elevated glucose of insignificant value, troponin is negative x 2, CBC within normal  limits  Imaging Studies:  I ordered imaging studies including two-view chest x-ray I independently visualized and interpreted imaging which showed no acute findings I agree with the radiologist interpretation  Cardiac Monitoring/ECG:  The patient was maintained on a cardiac monitor.  I personally viewed and interpreted the cardiac monitored which showed an underlying rhythm of:  EKG shows sinus tachycardia at a rate of 109 now resolved  Medicines ordered and prescription drug management:  I ordered medication including  Medications  fosfomycin (MONUROL ) packet 3 g (3 g Oral Given 03/15/24 1823)   for culture and sensitivity reports.   Test Considered:    Critical Interventions:    Consultations Obtained: I discussed the case with on-call infectious disease pharmacist who recommends fosfomycin after review of recent outpatient culture and sensitivity reports.  This was done through secure message.  Problem List / ED Course:     ICD-10-CM   1. Atypical chest pain  R07.89       MDM: Patient here with complaint of chest pain.  She is a history of atrial fibrillation and earlier had some sinus tachycardia but has no evidence of ACS, pulmonary edema, pneumothorax, or pneumonia.  Patient has been well-appearing throughout her entire visit without return of her symptoms.  She does not appear to have any emergent cause of her chest discomfort today.  She is advised to follow closely with her primary care doctor to get further instruction about what his wishes were regarding potential IV antibiotics as it appears that based on what I can see her urine is both susceptible to nitrofurantoin and fosfomycin.  Patient appears otherwise appropriate for discharge at this time   Dispostion:  After consideration of the diagnostic results and the patients response to treatment, I feel that the patent would benefit from discharge with strict return precautions and outpatient follow-up.       Final diagnoses:  None    ED Discharge Orders     None          Arloa Chroman, DEVONNA 03/15/24 VITO Randol Simmonds, MD 03/16/24 1028

## 2024-03-20 ENCOUNTER — Other Ambulatory Visit: Payer: Self-pay

## 2024-03-20 ENCOUNTER — Encounter (HOSPITAL_COMMUNITY): Payer: Self-pay

## 2024-03-20 ENCOUNTER — Emergency Department (HOSPITAL_COMMUNITY)
Admission: EM | Admit: 2024-03-20 | Discharge: 2024-03-20 | Disposition: A | Discharge status: Home / Self Care | Source: Ambulatory Visit

## 2024-03-20 DIAGNOSIS — N390 Urinary tract infection, site not specified: Secondary | ICD-10-CM | POA: Insufficient documentation

## 2024-03-20 DIAGNOSIS — Z7901 Long term (current) use of anticoagulants: Secondary | ICD-10-CM | POA: Insufficient documentation

## 2024-03-20 DIAGNOSIS — R300 Dysuria: Secondary | ICD-10-CM | POA: Diagnosis present

## 2024-03-20 LAB — URINALYSIS, ROUTINE W REFLEX MICROSCOPIC
Bilirubin Urine: NEGATIVE
Glucose, UA: NEGATIVE mg/dL
Ketones, ur: NEGATIVE mg/dL
Nitrite: POSITIVE — AB
Protein, ur: NEGATIVE mg/dL
Specific Gravity, Urine: 1.012 (ref 1.005–1.030)
WBC, UA: >50 WBC/hpf (ref 0–5)
pH: 6 (ref 5.0–8.0)

## 2024-03-20 LAB — CBC
HCT: 40.7 % (ref 36.0–46.0)
Hemoglobin: 12.6 g/dL (ref 12.0–15.0)
MCH: 30.5 pg (ref 26.0–34.0)
MCHC: 31 g/dL (ref 30.0–36.0)
MCV: 98.5 fL (ref 80.0–100.0)
Platelets: 201 K/uL (ref 150–400)
RBC: 4.13 MIL/uL (ref 3.87–5.11)
RDW: 13.8 % (ref 11.5–15.5)
WBC: 5.9 K/uL (ref 4.0–10.5)
nRBC: 0 % (ref 0.0–0.2)

## 2024-03-20 LAB — BASIC METABOLIC PANEL WITH GFR
Anion gap: 10 (ref 5–15)
BUN: 9 mg/dL (ref 8–23)
CO2: 26 mmol/L (ref 22–32)
Calcium: 11.2 mg/dL — ABNORMAL HIGH (ref 8.9–10.3)
Chloride: 104 mmol/L (ref 98–111)
Creatinine, Ser: 0.74 mg/dL (ref 0.44–1.00)
GFR, Estimated: >60 mL/min (ref >60)
Glucose, Bld: 91 mg/dL (ref 70–99)
Potassium: 4 mmol/L (ref 3.5–5.1)
Sodium: 140 mmol/L (ref 135–145)

## 2024-03-20 MED ORDER — CIPROFLOXACIN HCL 500 MG PO TABS: 500.0000 mg | ORAL_TABLET | Freq: Two times a day (BID) | ORAL | 0 refills | Status: DC

## 2024-03-20 MED ORDER — SODIUM CHLORIDE 0.9 % IV SOLN
1.0000 g | Freq: Once | INTRAVENOUS | Status: AC
  Administered 2024-03-20: 1 g via INTRAVENOUS
  Filled 2024-03-20: qty 10

## 2024-03-20 NOTE — ED Provider Notes (Signed)
 [place] EMERGENCY DEPARTMENT AT [facility] Provider Note   CSN: [id number] Arrival date & time: 03/20/24  1158     Patient presents with: Urinary Tract Infection   Jeanette [name] is a 89 y.o. female.   Is a 89 year old presenting emergency department for frequency, some dysuria.  Has a history of recurrent urinary tract infections and has been on and off antibiotics for some time.  She was directed to the emergency department by her primary doctor, although she is not quite sure why.  She has not provided them a specimen recently.  Not having fevers chills nausea vomiting.   Urinary Tract Infection      Prior to Admission medications   Medication Sig Start Date End Date Taking? Authorizing Provider  ciprofloxacin  (CIPRO ) 500 MG tablet Take 1 tablet (500 mg total) by mouth every 12 (twelve) hours. 03/20/24  Yes Neysa Caron PARAS, DO  ACETAMINOPHEN  ER PO Take 500 mg by mouth 2 (two) times daily.    [provider]  apixaban  (ELIQUIS ) 2.5 MG TABS tablet Take 1 tablet (2.5 mg total) by mouth 2 (two) times daily. 11/06/23   Duke, Jon Garre, PA  escitalopram  (LEXAPRO ) 10 MG tablet Take 10 mg by mouth daily.    [provider]  fosfomycin (MONUROL ) 3 g PACK Take by mouth. Takes 1 dose every 7 days for UTI's 11/16/23   [provider]  nitroGLYCERIN  (NITROSTAT ) 0.4 MG SL tablet PLACE 1 TABLET (0.4 MG TOTAL) UNDER THE TONGUE EVERY 5 (FIVE) MINUTES AS NEEDED FOR CHEST PAIN. 07/13/19 02/25/24  Patwardhan, Newman PARAS, MD  propafenone  (RYTHMOL  SR) 225 MG 12 hr capsule Take 1 capsule (225 mg total) by mouth 2 (two) times daily. 02/25/24   Camnitz, Will Gladis, MD  REPATHA SURECLICK 140 MG/ML SOAJ Inject 140 mg into the skin See admin instructions. Take 140mg  SQ every two weeks per patient 10/12/19   [provider]  VITAMIN D  PO Take 1 tablet by mouth daily.    [provider]    Allergies: Epinephrine , Cozaar [losartan], Crestor  [rosuvastatin], Ezetimibe, Galantamine hydrobromide, Iron, Pregabalin, Septra [sulfamethoxazole-trimethoprim], Vytorin [ezetimibe-simvastatin], Benazepril hcl, Crestor [rosuvastatin calcium ], and Lisinopril    Review of Systems  Updated Vital Signs BP (!) 172/106 (BP Location: Right Arm)   Pulse 97   Temp 98 F (36.7 C) (Oral)   Resp 18   Ht 5' 2 (1.575 m)   Wt 57 kg   SpO2 100%   BMI 22.98 kg/m   Physical Exam Vitals and nursing note reviewed.  Constitutional:      General: She is not in acute distress.    Appearance: She is not toxic-appearing.  HENT:     Head: Normocephalic and atraumatic.     Nose: Nose normal.     Mouth/Throat:     Mouth: Mucous membranes are moist.  Eyes:     Conjunctiva/sclera: Conjunctivae normal.  Cardiovascular:     Rate and Rhythm: Normal rate and regular rhythm.  Pulmonary:     Effort: Pulmonary effort is normal.     Breath sounds: Normal breath sounds.  Abdominal:     General: Abdomen is flat. There is no distension.     Tenderness: There is no abdominal tenderness. There is no guarding or rebound.  Neurological:     Mental Status: She is alert.     (all labs ordered are listed, but only abnormal results are displayed) Labs Reviewed  BASIC METABOLIC PANEL WITH GFR - Abnormal; Notable  for the following components:      Result Value   Calcium  11.2 (*)    All other components within normal limits  URINALYSIS, ROUTINE W REFLEX MICROSCOPIC - Abnormal; Notable for the following components:   APPearance HAZY (*)    Hgb urine dipstick SMALL (*)    Nitrite POSITIVE (*)    Leukocytes,Ua LARGE (*)    Bacteria, UA RARE (*)    Non Squamous Epithelial 0-5 (*)    All other components within normal limits  URINE CULTURE  CBC    EKG: EKG Interpretation Date/Time:  Friday March 20 2024 14:46:03 EDT Ventricular Rate:  83 PR Interval:  201 QRS Duration:  86 QT Interval:  394 QTC Calculation: 463 R Axis:   68  Text  Interpretation: Sinus rhythm Supraventricular bigeminy Confirmed by Neysa Clap [phone]) on 03/20/2024 3:17:10 PM  Radiology: No results found.   Procedures   Medications Ordered in the ED  cefTRIAXone  (ROCEPHIN ) 1 g in sodium chloride  0.9 % 100 mL IVPB (1 g Intravenous New Bag/Given 03/20/24 1437)    Clinical Course as of 03/20/24 1523  Fri Mar 20, 2024  1345 Urinalysis, Routine w reflex microscopic -Urine, Clean Catch(!) Consistent with UTI.  Will give Rocephin  [TY]  1352 Urinalysis, Routine w reflex microscopic -Urine, Clean Catch(!) He has prior cultures from last year at the most recent that I can find with Klebsiella.  Appears to be susceptible to ciprofloxacin . [TY]    Clinical Course User Index [TY] Neysa Clap PARAS, DO                                 Medical Decision Making This is an 89 year old female presenting emergency department with dysuria, frequency.  Has a history of recurrent UTIs.  With similar sensation to the many UTIs that she has had.  Vital signs reassuring.  She is having no nausea no vomiting.  Basic labs with baseline kidney function.  No metabolic derangements.  She has no fever or leukocytosis to suggest systemic infection.  Her urine does appear to have UTI.  Given dose of Rocephin  here.  Urine culture sent as it does appear that she has Klebsiella UTI last year.  Feel that she can trial outpatient antibiotics and follow-up with her PCP.  Adjust antibiotics based on urine culture.  Amount and/or Complexity of Data Reviewed External Data Reviewed:     Details: See ed course.  Labs: ordered. Decision-making details documented in ED Course. Radiology:     Details: No abdominal pain to suggest acute intra-abdominal process.  Benign abdomen.  Will forego CT abdomen at this time.  Risk Prescription drug management. Decision regarding hospitalization. Diagnosis or treatment significantly limited by social determinants of health.       Final diagnoses:   Lower urinary tract infectious disease    ED Discharge Orders          Ordered    ciprofloxacin  (CIPRO ) 500 MG tablet  Every 12 hours        03/20/24 1437               Neysa Clap PARAS, DO 03/20/24 1523

## 2024-03-20 NOTE — Discharge Instructions (Addendum)
 Please follow-up with your primary doctor.  Return if develop fevers, chills, inability to take your antibiotics due to nausea and vomiting, severe pain, stop urinating or develop any new or worsening symptoms that are concerning to you.

## 2024-03-20 NOTE — ED Triage Notes (Signed)
 Patient said her primary care doctor sent her here to be seen by a urologist. Was told she has a UTI and need IV antibiotics. Is currently not taking antibiotics. Has had on and off UTI's for 20 years.

## 2024-03-22 LAB — URINE CULTURE: Culture: >=100000 — AB

## 2024-03-23 ENCOUNTER — Telehealth (HOSPITAL_BASED_OUTPATIENT_CLINIC_OR_DEPARTMENT_OTHER): Payer: Self-pay | Admitting: *Deleted

## 2024-03-23 NOTE — Telephone Encounter (Signed)
 Post ED Visit - Positive Culture Follow-up  Culture report reviewed by antimicrobial stewardship pharmacist: [name] [address] Pharm.D. []  Venetia Gully, Pharm.D., BCPS AQ-ID []  Garrel Crews, Pharm.D., BCPS [x]  Almarie Lunger, Pharm.D., BCPS []  [place], [address].D., BCPS, AAHIVP []  Rosaline Bihari, Pharm.D., BCPS, AAHIVP []  Vernell Meier, PharmD, BCPS []  Latanya Hint, PharmD, BCPS []  Donald Medley, PharmD, BCPS []  Rocky Bold, PharmD []  Dorothyann Alert, PharmD, BCPS []  Morene Babe, PharmD  [facility] []  Rosaline Edison, PharmD []  Romona Bliss, PharmD []  Dolphus Roller, PharmD []  Veva Seip, Rph []  Vernell Daunt) Jeanette [name], PharmD []  Eva Allis, PharmD []  Rosaline Millet, PharmD []  Iantha Batch, PharmD []  Arvin Gauss, PharmD []  Wanda Hasting, PharmD []  Ronal Rav, PharmD []  Rocky Slade, PharmD []  Bard Jeans, PharmD   Positive urine culture Treated with Ciprofloxacin , organism sensitive to the same and no further patient follow-up is required at this time.  Jeanette [name] 03/23/2024, 9:13 AM

## 2024-03-30 ENCOUNTER — Other Ambulatory Visit: Payer: Self-pay

## 2024-03-30 ENCOUNTER — Emergency Department (HOSPITAL_COMMUNITY)
Admission: EM | Admit: 2024-03-30 | Discharge: 2024-03-30 | Disposition: A | Discharge status: Home / Self Care | Attending: Emergency Medicine | Admitting: Emergency Medicine

## 2024-03-30 DIAGNOSIS — R300 Dysuria: Secondary | ICD-10-CM | POA: Insufficient documentation

## 2024-03-30 DIAGNOSIS — Z7901 Long term (current) use of anticoagulants: Secondary | ICD-10-CM | POA: Insufficient documentation

## 2024-03-30 LAB — URINALYSIS, ROUTINE W REFLEX MICROSCOPIC
Bacteria, UA: NONE SEEN
Bilirubin Urine: NEGATIVE
Glucose, UA: NEGATIVE mg/dL
Hgb urine dipstick: NEGATIVE
Ketones, ur: NEGATIVE mg/dL
Nitrite: NEGATIVE
Protein, ur: NEGATIVE mg/dL
Specific Gravity, Urine: 1.018 (ref 1.005–1.030)
pH: 7 (ref 5.0–8.0)

## 2024-03-30 NOTE — Discharge Instructions (Signed)
 Encounter today was reassuring overall.  Based on your urinalysis today it appears that your UTI has resolved and is resolving.  Do not feel that antibiotics or hospitalization is indicated at this time.  Please follow-up with your urologist and your PCP.  If you develop a fever, worsening urinary symptoms, abdominal or flank pain please return to the ED for further evaluation.

## 2024-03-30 NOTE — ED Provider Notes (Signed)
 [place] EMERGENCY DEPARTMENT AT [facility] Provider Note   CSN: [id number] Arrival date & time: 03/30/24  1025     Patient presents with: Dysuria  HPI Jeanette [name] is a 89 y.o. female with history of recurrent UTIs presenting for dysuria. She endorses burning with urination that started this past Tuesday.  Was evaluated for similar symptoms here 10 days ago and sent home with ciprofloxacin . She states she has been taking as prescribed but symptoms have returned.  Denies any abdominal or flank pain or fever at this time.    Dysuria      Prior to Admission medications   Medication Sig Start Date End Date Taking? Authorizing Provider  ACETAMINOPHEN  ER PO Take 500 mg by mouth 2 (two) times daily.    [provider]  apixaban  (ELIQUIS ) 2.5 MG TABS tablet Take 1 tablet (2.5 mg total) by mouth 2 (two) times daily. 11/06/23   D[facility], Jon Garre, PA  ciprofloxacin  (CIPRO ) 500 MG tablet Take 1 tablet (500 mg total) by mouth every 12 (twelve) hours. 03/20/24   Neysa Caron PARAS, DO  escitalopram  (LEXAPRO ) 10 MG tablet Take 10 mg by mouth daily.    [provider]  fosfomycin (MONUROL ) 3 g PACK Take by mouth. Takes 1 dose every 7 days for UTI's 11/16/23   [provider]  nitroGLYCERIN  (NITROSTAT ) 0.4 MG SL tablet PLACE 1 TABLET (0.4 MG TOTAL) UNDER THE TONGUE EVERY 5 (FIVE) MINUTES AS NEEDED FOR CHEST PAIN. 07/13/19 02/25/24  Patwardhan, Newman PARAS, MD  propafenone  (RYTHMOL  SR) 225 MG 12 hr capsule Take 1 capsule (225 mg total) by mouth 2 (two) times daily. 02/25/24   Camnitz, Will Gladis, MD  REPATHA SURECLICK 140 MG/ML SOAJ Inject 140 mg into the skin See admin instructions. Take 140mg  SQ every two weeks per patient 10/12/19   [provider]  VITAMIN D  PO Take 1 tablet by mouth daily.    [provider]    Allergies: Epinephrine , Cozaar [losartan], Crestor [rosuvastatin], Ezetimibe, Galantamine hydrobromide, Iron, Pregabalin, Septra  [sulfamethoxazole-trimethoprim], Vytorin [ezetimibe-simvastatin], Benazepril hcl, Crestor [rosuvastatin calcium ], and Lisinopril    Review of Systems  Genitourinary:  Positive for dysuria.    Updated Vital Signs BP (!) 145/87 (BP Location: Right Arm)   Pulse 95   Temp 98.2 F (36.8 C) (Oral)   Resp 16   SpO2 97%   Physical Exam Vitals and nursing note reviewed.  HENT:     Head: Normocephalic and atraumatic.     Mouth/Throat:     Mouth: Mucous membranes are moist.  Eyes:     General:        Right eye: No discharge.        Left eye: No discharge.     Conjunctiva/sclera: Conjunctivae normal.  Cardiovascular:     Rate and Rhythm: Normal rate and regular rhythm.     Pulses: Normal pulses.     Heart sounds: Normal heart sounds.  Pulmonary:     Effort: Pulmonary effort is normal.     Breath sounds: Normal breath sounds.  Abdominal:     General: Abdomen is flat. There is no distension.     Palpations: Abdomen is soft.     Tenderness: There is no abdominal tenderness.  Skin:    General: Skin is warm and dry.  Neurological:     General: No focal deficit present.  Psychiatric:        Mood and Affect: Mood normal.     (all labs  ordered are listed, but only abnormal results are displayed) Labs Reviewed  URINALYSIS, ROUTINE W REFLEX MICROSCOPIC - Abnormal; Notable for the following components:      Result Value   APPearance HAZY (*)    Leukocytes,Ua MODERATE (*)    All other components within normal limits  URINE CULTURE    EKG: None  Radiology: No results found.   Procedures   Medications Ordered in the ED - No data to display                                  Medical Decision Making Amount and/or Complexity of Data Reviewed Labs: ordered.   89 year old well-appearing female presenting for dysuria.  Exam is unremarkable.  Reviewed her chart and it appears she was diagnosed with a UTI on the 12th of this month at home with ciprofloxacin .  Urine culture at  that time grew Proteus Hauseri which did show that it was sensitive ciprofloxacin .  Urinalysis on unremarkable today.  There were no bacteria or nitrites.  Some leukocytes.  Patient does have close follow-up with her urologist.  At this time do not feel that he standing or trying a different antibiotic would be beneficial as it appears that her UTI is resolving or has resolved.  Advised her to follow-up with urologist.  Discussed return precautions.  Discharged in good condition.     Final diagnoses:  Dysuria    ED Discharge Orders     None          Lang Norleen POUR, PA-C 03/30/24 1418    Melvenia Motto, MD 03/30/24 [phone]

## 2024-03-30 NOTE — ED Triage Notes (Signed)
 Pt reports dysuria that started Tuesday. Pt reports she was told by her PCP to come to the ER.

## 2024-03-31 LAB — URINE CULTURE: Culture: NO GROWTH

## 2024-04-12 ENCOUNTER — Emergency Department (HOSPITAL_COMMUNITY)
Admission: EM | Admit: 2024-04-12 | Discharge: 2024-04-12 | Disposition: A | Discharge status: Home / Self Care | Source: Home / Self Care | Attending: Emergency Medicine | Admitting: Emergency Medicine

## 2024-04-12 ENCOUNTER — Encounter (HOSPITAL_COMMUNITY): Payer: Self-pay | Admitting: Emergency Medicine

## 2024-04-12 DIAGNOSIS — N12 Tubulo-interstitial nephritis, not specified as acute or chronic: Secondary | ICD-10-CM | POA: Diagnosis not present

## 2024-04-12 DIAGNOSIS — Z7901 Long term (current) use of anticoagulants: Secondary | ICD-10-CM | POA: Insufficient documentation

## 2024-04-12 DIAGNOSIS — N390 Urinary tract infection, site not specified: Secondary | ICD-10-CM | POA: Insufficient documentation

## 2024-04-12 DIAGNOSIS — A419 Sepsis, unspecified organism: Secondary | ICD-10-CM | POA: Diagnosis not present

## 2024-04-12 LAB — URINALYSIS, ROUTINE W REFLEX MICROSCOPIC
Bilirubin Urine: NEGATIVE
Glucose, UA: NEGATIVE mg/dL
Hgb urine dipstick: NEGATIVE
Ketones, ur: NEGATIVE mg/dL
Nitrite: POSITIVE — AB
Protein, ur: 100 mg/dL — AB
Specific Gravity, Urine: 1.016 (ref 1.005–1.030)
WBC, UA: >50 WBC/hpf (ref 0–5)
pH: 5 (ref 5.0–8.0)

## 2024-04-12 LAB — BASIC METABOLIC PANEL WITH GFR
Anion gap: 7 (ref 5–15)
BUN: 12 mg/dL (ref 8–23)
CO2: 28 mmol/L (ref 22–32)
Calcium: 10.4 mg/dL — ABNORMAL HIGH (ref 8.9–10.3)
Chloride: 105 mmol/L (ref 98–111)
Creatinine, Ser: 0.71 mg/dL (ref 0.44–1.00)
GFR, Estimated: >60 mL/min (ref >60)
Glucose, Bld: 93 mg/dL (ref 70–99)
Potassium: 3.4 mmol/L — ABNORMAL LOW (ref 3.5–5.1)
Sodium: 139 mmol/L (ref 135–145)

## 2024-04-12 LAB — CBC WITH DIFFERENTIAL/PLATELET
Abs Immature Granulocytes: 0.04 K/uL (ref 0.00–0.07)
Basophils Absolute: 0 K/uL (ref 0.0–0.1)
Basophils Relative: 0 %
Eosinophils Absolute: 0 K/uL (ref 0.0–0.5)
Eosinophils Relative: 0 %
HCT: 34.9 % — ABNORMAL LOW (ref 36.0–46.0)
Hemoglobin: 10.7 g/dL — ABNORMAL LOW (ref 12.0–15.0)
Immature Granulocytes: 1 %
Lymphocytes Relative: 11 %
Lymphs Abs: 0.8 K/uL (ref 0.7–4.0)
MCH: 30.1 pg (ref 26.0–34.0)
MCHC: 30.7 g/dL (ref 30.0–36.0)
MCV: 98 fL (ref 80.0–100.0)
Monocytes Absolute: 1.7 K/uL — ABNORMAL HIGH (ref 0.1–1.0)
Monocytes Relative: 23 %
Neutro Abs: 4.8 K/uL (ref 1.7–7.7)
Neutrophils Relative %: 65 %
Platelets: 199 K/uL (ref 150–400)
RBC: 3.56 MIL/uL — ABNORMAL LOW (ref 3.87–5.11)
RDW: 13.9 % (ref 11.5–15.5)
WBC: 7.4 K/uL (ref 4.0–10.5)
nRBC: 0 % (ref 0.0–0.2)

## 2024-04-12 MED ORDER — SODIUM CHLORIDE 0.9 % IV BOLUS
500.0000 mL | Freq: Once | INTRAVENOUS | Status: AC
  Administered 2024-04-12: 500 mL via INTRAVENOUS

## 2024-04-12 MED ORDER — CIPROFLOXACIN HCL 500 MG PO TABS: 500.0000 mg | ORAL_TABLET | Freq: Two times a day (BID) | ORAL | 0 refills | Status: DC

## 2024-04-12 MED ORDER — SODIUM CHLORIDE 0.9 % IV SOLN
1.0000 g | Freq: Once | INTRAVENOUS | Status: AC
  Administered 2024-04-12: 1 g via INTRAVENOUS
  Filled 2024-04-12: qty 10

## 2024-04-12 MED ORDER — POTASSIUM CHLORIDE CRYS ER 20 MEQ PO TBCR
40.0000 meq | EXTENDED_RELEASE_TABLET | Freq: Once | ORAL | Status: AC
  Administered 2024-04-12: 40 meq via ORAL
  Filled 2024-04-12: qty 2

## 2024-04-12 NOTE — Discharge Instructions (Addendum)
 You may start the antibiotics tomorrow, you were given an IV dose of antibiotics today.  Otherwise, follow-up with your primary care provider and urologist.  If you develop fever, vomiting, or any other new/concerning symptoms then return to the ER.

## 2024-04-12 NOTE — ED Provider Notes (Signed)
 [facility] DEPARTMENT AT [facility] Provider Note   CSN: [id number] Arrival date & time: 04/12/24  9070     Patient presents with: Recurrent UTI   Jeanette [name] is a 89 y.o. female.   HPI 89 year old female with a history of recurrent UTIs presents with concern for another UTI.  She states she has been told by her doctor in the past that due to the complexity of her UTIs she will need IV antibiotics.  This is similar to when she was seen in September for UTIs.  Nothing feels different, has some stinging with urination, suprapubic pain and some back pain.  No fevers or vomiting.  Symptoms started last night.  Last course of antibiotics she got cleared her infection but now this is a new infection.  Prior to Admission medications   Medication Sig Start Date End Date Taking? Authorizing Provider  ciprofloxacin  (CIPRO ) 500 MG tablet Take 1 tablet (500 mg total) by mouth every 12 (twelve) hours. 04/12/24  Yes Freddi Hamilton, MD  ACETAMINOPHEN  ER PO Take 500 mg by mouth 2 (two) times daily.    [provider]  apixaban  (ELIQUIS ) 2.5 MG TABS tablet Take 1 tablet (2.5 mg total) by mouth 2 (two) times daily. 11/06/23   Duke, Jon Garre, PA  escitalopram  (LEXAPRO ) 10 MG tablet Take 10 mg by mouth daily.    [provider]  fosfomycin (MONUROL ) 3 g PACK Take by mouth. Takes 1 dose every 7 days for UTI's 11/16/23   [provider]  nitroGLYCERIN  (NITROSTAT ) 0.4 MG SL tablet PLACE 1 TABLET (0.4 MG TOTAL) UNDER THE TONGUE EVERY 5 (FIVE) MINUTES AS NEEDED FOR CHEST PAIN. 07/13/19 02/25/24  Patwardhan, Newman PARAS, MD  propafenone  (RYTHMOL  SR) 225 MG 12 hr capsule Take 1 capsule (225 mg total) by mouth 2 (two) times daily. 02/25/24   Camnitz, Will Gladis, MD  REPATHA SURECLICK 140 MG/ML SOAJ Inject 140 mg into the skin See admin instructions. Take 140mg  SQ every two weeks per patient 10/12/19   [provider]  VITAMIN D  PO Take 1 tablet by mouth daily.     [provider]    Allergies: Epinephrine , Cozaar [losartan], Crestor [rosuvastatin], Ezetimibe, Galantamine hydrobromide, Iron, Pregabalin, Septra [sulfamethoxazole-trimethoprim], Vytorin [ezetimibe-simvastatin], Benazepril hcl, Crestor [rosuvastatin calcium ], and Lisinopril    Review of Systems  Constitutional:  Negative for fever.  Gastrointestinal:  Positive for abdominal pain. Negative for vomiting.  Genitourinary:  Positive for dysuria.  Musculoskeletal:  Positive for back pain.    Updated Vital Signs BP (!) 157/73   Pulse 97   Temp 98.5 F (36.9 C) (Oral)   Resp 16   SpO2 100%   Physical Exam Vitals and nursing note reviewed.  Constitutional:      General: She is not in acute distress.    Appearance: She is well-developed. She is not ill-appearing or diaphoretic.  HENT:     Head: Normocephalic and atraumatic.  Cardiovascular:     Rate and Rhythm: Normal rate and regular rhythm.     Heart sounds: Normal heart sounds.  Pulmonary:     Effort: Pulmonary effort is normal.     Breath sounds: Normal breath sounds.  Abdominal:     General: There is no distension.     Palpations: Abdomen is soft.     Tenderness: There is no abdominal tenderness. There is no right CVA tenderness or left CVA tenderness.  Skin:    General: Skin is warm and dry.  Neurological:     Mental Status: She is alert.     (all labs ordered are listed, but only abnormal results are displayed) Labs Reviewed  URINALYSIS, ROUTINE W REFLEX MICROSCOPIC - Abnormal; Notable for the following components:      Result Value   APPearance CLOUDY (*)    Protein, ur 100 (*)    Nitrite POSITIVE (*)    Leukocytes,Ua LARGE (*)    Bacteria, UA FEW (*)    All other components within normal limits  CBC WITH DIFFERENTIAL/PLATELET - Abnormal; Notable for the following components:   RBC 3.56 (*)    Hemoglobin 10.7 (*)    HCT 34.9 (*)    Monocytes Absolute 1.7 (*)    All other components within normal  limits  BASIC METABOLIC PANEL WITH GFR - Abnormal; Notable for the following components:   Potassium 3.4 (*)    Calcium  10.4 (*)    All other components within normal limits  URINE CULTURE    EKG: None  Radiology: No results found.   Procedures   Medications Ordered in the ED  sodium chloride  0.9 % bolus 500 mL (0 mLs Intravenous Stopped 04/12/24 1209)  potassium chloride  SA (KLOR-CON  M) CR tablet 40 mEq (40 mEq Oral Given 04/12/24 1146)  cefTRIAXone  (ROCEPHIN ) 1 g in sodium chloride  0.9 % 100 mL IVPB (0 g Intravenous Stopped 04/12/24 1239)                                    Medical Decision Making Amount and/or Complexity of Data Reviewed Labs: ordered.    Details: UTI, normal WBC  Risk Prescription drug management.   Patient presents with recurrent UTI.  Does appear to have 1 on labs, also has some mild hypokalemia and mild hypercalcemia (though this is improved).  Given some fluids.  Given a dose of Rocephin  and will put on Cipro , which she has been given in the past and has been effective in the past according to chart review.  Otherwise, she is feeling well.  She does have some mild anemia compared to baseline but she states anemia is normal for her.  No signs or symptoms of bleeding.  Doubt pyelonephritis based on history and exam at this time.  Will discharge with return precautions.     Final diagnoses:  Recurrent UTI    ED Discharge Orders          Ordered    ciprofloxacin  (CIPRO ) 500 MG tablet  Every 12 hours        04/12/24 1242               Freddi Hamilton, MD 04/12/24 1515

## 2024-04-12 NOTE — ED Triage Notes (Signed)
 Pt arriving POV for recurrent UTI. Pt states her primary provider and urologist told her to come to the ER for IV antibiotics.

## 2024-04-13 ENCOUNTER — Other Ambulatory Visit: Payer: Self-pay

## 2024-04-13 ENCOUNTER — Encounter (HOSPITAL_COMMUNITY): Payer: Self-pay | Admitting: Internal Medicine

## 2024-04-13 ENCOUNTER — Inpatient Hospital Stay (HOSPITAL_COMMUNITY)
Admission: EM | Admit: 2024-04-13 | Discharge: 2024-04-16 | DRG: 871 | Disposition: A | Discharge status: Home / Self Care | Source: Ambulatory Visit | Attending: Internal Medicine | Admitting: Internal Medicine

## 2024-04-13 ENCOUNTER — Emergency Department (HOSPITAL_COMMUNITY)

## 2024-04-13 ENCOUNTER — Inpatient Hospital Stay (HOSPITAL_COMMUNITY)

## 2024-04-13 DIAGNOSIS — D649 Anemia, unspecified: Secondary | ICD-10-CM | POA: Diagnosis present

## 2024-04-13 DIAGNOSIS — Z8673 Personal history of transient ischemic attack (TIA), and cerebral infarction without residual deficits: Secondary | ICD-10-CM

## 2024-04-13 DIAGNOSIS — J9601 Acute respiratory failure with hypoxia: Secondary | ICD-10-CM | POA: Diagnosis present

## 2024-04-13 DIAGNOSIS — E785 Hyperlipidemia, unspecified: Secondary | ICD-10-CM | POA: Diagnosis present

## 2024-04-13 DIAGNOSIS — Z09 Encounter for follow-up examination after completed treatment for conditions other than malignant neoplasm: Secondary | ICD-10-CM

## 2024-04-13 DIAGNOSIS — Z881 Allergy status to other antibiotic agents status: Secondary | ICD-10-CM | POA: Diagnosis not present

## 2024-04-13 DIAGNOSIS — I701 Atherosclerosis of renal artery: Secondary | ICD-10-CM | POA: Diagnosis present

## 2024-04-13 DIAGNOSIS — Z79899 Other long term (current) drug therapy: Secondary | ICD-10-CM

## 2024-04-13 DIAGNOSIS — Z85048 Personal history of other malignant neoplasm of rectum, rectosigmoid junction, and anus: Secondary | ICD-10-CM | POA: Diagnosis not present

## 2024-04-13 DIAGNOSIS — I110 Hypertensive heart disease with heart failure: Secondary | ICD-10-CM | POA: Diagnosis present

## 2024-04-13 DIAGNOSIS — N12 Tubulo-interstitial nephritis, not specified as acute or chronic: Principal | ICD-10-CM | POA: Diagnosis present

## 2024-04-13 DIAGNOSIS — B961 Klebsiella pneumoniae [K. pneumoniae] as the cause of diseases classified elsewhere: Secondary | ICD-10-CM | POA: Diagnosis present

## 2024-04-13 DIAGNOSIS — I5032 Chronic diastolic (congestive) heart failure: Secondary | ICD-10-CM | POA: Diagnosis present

## 2024-04-13 DIAGNOSIS — Z923 Personal history of irradiation: Secondary | ICD-10-CM

## 2024-04-13 DIAGNOSIS — N200 Calculus of kidney: Secondary | ICD-10-CM | POA: Diagnosis present

## 2024-04-13 DIAGNOSIS — N3020 Other chronic cystitis without hematuria: Secondary | ICD-10-CM | POA: Diagnosis present

## 2024-04-13 DIAGNOSIS — R112 Nausea with vomiting, unspecified: Secondary | ICD-10-CM | POA: Diagnosis not present

## 2024-04-13 DIAGNOSIS — Z820 Family history of epilepsy and other diseases of the nervous system: Secondary | ICD-10-CM | POA: Diagnosis not present

## 2024-04-13 DIAGNOSIS — Z888 Allergy status to other drugs, medicaments and biological substances status: Secondary | ICD-10-CM

## 2024-04-13 DIAGNOSIS — F419 Anxiety disorder, unspecified: Secondary | ICD-10-CM | POA: Diagnosis present

## 2024-04-13 DIAGNOSIS — I739 Peripheral vascular disease, unspecified: Secondary | ICD-10-CM | POA: Diagnosis present

## 2024-04-13 DIAGNOSIS — Z7901 Long term (current) use of anticoagulants: Secondary | ICD-10-CM | POA: Diagnosis not present

## 2024-04-13 DIAGNOSIS — A419 Sepsis, unspecified organism: Principal | ICD-10-CM | POA: Diagnosis present

## 2024-04-13 DIAGNOSIS — I480 Paroxysmal atrial fibrillation: Secondary | ICD-10-CM | POA: Diagnosis present

## 2024-04-13 DIAGNOSIS — I340 Nonrheumatic mitral (valve) insufficiency: Secondary | ICD-10-CM | POA: Diagnosis present

## 2024-04-13 DIAGNOSIS — Z853 Personal history of malignant neoplasm of breast: Secondary | ICD-10-CM

## 2024-04-13 DIAGNOSIS — Z8249 Family history of ischemic heart disease and other diseases of the circulatory system: Secondary | ICD-10-CM

## 2024-04-13 DIAGNOSIS — Z87440 Personal history of urinary (tract) infections: Secondary | ICD-10-CM

## 2024-04-13 DIAGNOSIS — Z87892 Personal history of anaphylaxis: Secondary | ICD-10-CM

## 2024-04-13 DIAGNOSIS — F32A Depression, unspecified: Secondary | ICD-10-CM | POA: Diagnosis present

## 2024-04-13 LAB — CBC
HCT: 35.3 % — ABNORMAL LOW (ref 36.0–46.0)
Hemoglobin: 11.1 g/dL — ABNORMAL LOW (ref 12.0–15.0)
MCH: 30.8 pg (ref 26.0–34.0)
MCHC: 31.4 g/dL (ref 30.0–36.0)
MCV: 98.1 fL (ref 80.0–100.0)
Platelets: 204 K/uL (ref 150–400)
RBC: 3.6 MIL/uL — ABNORMAL LOW (ref 3.87–5.11)
RDW: 13.6 % (ref 11.5–15.5)
WBC: 12 K/uL — ABNORMAL HIGH (ref 4.0–10.5)
nRBC: 0 % (ref 0.0–0.2)

## 2024-04-13 LAB — BASIC METABOLIC PANEL WITH GFR
Anion gap: 8 (ref 5–15)
BUN: 8 mg/dL (ref 8–23)
CO2: 25 mmol/L (ref 22–32)
Calcium: 10 mg/dL (ref 8.9–10.3)
Chloride: 103 mmol/L (ref 98–111)
Creatinine, Ser: 0.64 mg/dL (ref 0.44–1.00)
GFR, Estimated: >60 mL/min (ref >60)
Glucose, Bld: 143 mg/dL — ABNORMAL HIGH (ref 70–99)
Potassium: 4.1 mmol/L (ref 3.5–5.1)
Sodium: 136 mmol/L (ref 135–145)

## 2024-04-13 LAB — LACTIC ACID, PLASMA: Lactic Acid, Venous: 0.7 mmol/L (ref 0.5–1.9)

## 2024-04-13 MED ORDER — SODIUM CHLORIDE 0.9 % IV SOLN
1.0000 g | INTRAVENOUS | Status: DC
  Administered 2024-04-14 – 2024-04-16 (×3): 1 g via INTRAVENOUS
  Filled 2024-04-13 (×3): qty 10

## 2024-04-13 MED ORDER — MORPHINE SULFATE (PF) 4 MG/ML IV SOLN
4.0000 mg | Freq: Once | INTRAVENOUS | Status: AC
  Administered 2024-04-13: 4 mg via INTRAVENOUS
  Filled 2024-04-13: qty 1

## 2024-04-13 MED ORDER — SODIUM CHLORIDE 0.9 % IV BOLUS
1000.0000 mL | Freq: Once | INTRAVENOUS | Status: AC
  Administered 2024-04-13: 1000 mL via INTRAVENOUS

## 2024-04-13 MED ORDER — FENTANYL CITRATE PF 50 MCG/ML IJ SOSY: 25.0000 ug | PREFILLED_SYRINGE | INTRAMUSCULAR | Status: DC | PRN

## 2024-04-13 MED ORDER — OXYCODONE HCL 5 MG PO TABS: 5.0000 mg | ORAL_TABLET | ORAL | Status: DC | PRN

## 2024-04-13 MED ORDER — ACETAMINOPHEN 325 MG PO TABS: 650.0000 mg | ORAL_TABLET | Freq: Four times a day (QID) | ORAL | Status: DC | PRN

## 2024-04-13 MED ORDER — ESCITALOPRAM OXALATE 10 MG PO TABS
10.0000 mg | ORAL_TABLET | Freq: Every day | ORAL | Status: DC
  Administered 2024-04-13 – 2024-04-16 (×4): 10 mg via ORAL
  Filled 2024-04-13 (×4): qty 1

## 2024-04-13 MED ORDER — PROCHLORPERAZINE EDISYLATE 10 MG/2ML IJ SOLN: 5.0000 mg | Freq: Four times a day (QID) | INTRAMUSCULAR | Status: DC | PRN

## 2024-04-13 MED ORDER — SODIUM CHLORIDE 0.9 % IV SOLN
1.0000 g | Freq: Once | INTRAVENOUS | Status: AC
  Administered 2024-04-13: 1 g via INTRAVENOUS
  Filled 2024-04-13: qty 10

## 2024-04-13 MED ORDER — METOPROLOL SUCCINATE ER 100 MG PO TB24
100.0000 mg | ORAL_TABLET | Freq: Two times a day (BID) | ORAL | Status: DC
  Filled 2024-04-13: qty 1

## 2024-04-13 MED ORDER — PROPAFENONE HCL ER 225 MG PO CP12
225.0000 mg | ORAL_CAPSULE | Freq: Two times a day (BID) | ORAL | Status: DC
Start: 2024-04-13 — End: 2024-04-16
  Administered 2024-04-13 – 2024-04-16 (×6): 225 mg via ORAL
  Filled 2024-04-13 (×8): qty 1

## 2024-04-13 MED ORDER — IOHEXOL 350 MG/ML SOLN
75.0000 mL | Freq: Once | INTRAVENOUS | Status: AC | PRN
  Administered 2024-04-13: 75 mL via INTRAVENOUS

## 2024-04-13 MED ORDER — ACETAMINOPHEN 650 MG RE SUPP: 650.0000 mg | Freq: Four times a day (QID) | RECTAL | Status: DC | PRN

## 2024-04-13 MED ORDER — HYDROCODONE-ACETAMINOPHEN 5-325 MG PO TABS: 1.0000 | ORAL_TABLET | Freq: Four times a day (QID) | ORAL | Status: DC | PRN

## 2024-04-13 MED ORDER — APIXABAN 2.5 MG PO TABS
2.5000 mg | ORAL_TABLET | Freq: Two times a day (BID) | ORAL | Status: DC
  Administered 2024-04-13 – 2024-04-16 (×7): 2.5 mg via ORAL
  Filled 2024-04-13 (×7): qty 1

## 2024-04-13 MED ORDER — SODIUM CHLORIDE 0.9% FLUSH
3.0000 mL | Freq: Two times a day (BID) | INTRAVENOUS | Status: DC
  Administered 2024-04-13 – 2024-04-16 (×7): 3 mL via INTRAVENOUS

## 2024-04-13 NOTE — ED Notes (Signed)
 Report given to inpatient nursing unit.

## 2024-04-13 NOTE — ED Notes (Signed)
 Patient transported to CT

## 2024-04-13 NOTE — ED Notes (Signed)
 This nurse noted that patient's oxygen saturation dropped to 80%. This nurse entered patient's room to assess, patient was A/Ox4 and a steady and unlabored respiratory rate. This nurse placed patient on 2 liters of supplemental oxygen via nasal cannula. Patient's oxygen saturation rose to 100%. Attending PA Dufour made aware.

## 2024-04-13 NOTE — H&P (Signed)
 History and Physical    Patient: Jeanette [name] FMW:[id number] DOB: 05-Aug-1934 DOA: 04/13/2024 DOS: the patient was seen and examined on 04/13/2024 PCP: Shayne Anes, MD  Patient coming from: Home via EMS  Chief Complaint:  Chief Complaint  Patient presents with   Flank Pain   HPI: Jeanette [name] is a 89 y.o. female with medical history significant of hypertension, HLD, paroxysmal A-fib on Eliquis , CVA, PAD, renal artery stenosis, breast cancer, anal cancer,  anxiety, and depression presents with left-sided abdominal pain.  She has been experiencing stinging discomfort with urination over the last 2 days. Review of records note patient had come to [facility] emergency department for her symptoms yesterday where urinalysis noted large leukocytes, positive nitrites, few bacteria, 21-50 WBCs/hpf, 11-20 squamous epithelial cells/hpf, and greater than 50 WBCs.  Urine culture was obtained.  No imaging was done at that time.  Patient diagnosed with recurrent UTI for which she was given a dose of Rocephin  IV and discharged home with a prescription for ciprofloxacin .  She had not been able to pick up the prescription yet.  She experienced nausea and vomiting three times last night, describing the vomit as mucus-like with no blood present. No fever or chills were reported. She denies shortness of breath or cough, and is not on oxygen at home.  Patient notes that she developed significant left side pain yesterday evening as well which she came to the hospital for further evaluation.  She does not recall any prior history of kidney stones.  She has a long history of recurrent urinary tract infections, which have required multiple courses of antibiotics. Her urinary tract infections are related to a history of rectal cancer, which was treated with surgery and radiation. The radiation weakened her pelvic muscles.  She takes Eliquis  and has taken Tylenol  twice a day for headaches following a prior stroke. She  denies any residual weakness from the stroke but mentions cognitive effects and prior episodes of unconsciousness.  She lives at home with her husband, who is still working. She does not smoke or drink alcohol.  En route with EMS patient was given a 500 mL bolus of IV fluids and Zofran  4 mg IV  In the ED patient was noted to be afebrile with heart rates elevated to 106, respirations 16-23, blood pressure was elevated up to 182/95, and O2 saturations currently maintained on 2 L of nasal cannula oxygen after O2 saturations were noted to drop as low as 80% on room air.  Labs significant for WBC 12, hemoglobin 11.1, and glucose 143.  CT scan of the abdomen pelvis revealed 5 mm stone in the left kidney posterior and lateral to lower pole calyx resulting in calyceal obstruction, hypoenhancement of the affected overlying renal cortex, and asymmetric wall thickening and enhancement involving the left renal pelvis up to the left UPJ.  Patient had been given 1 L normal saline IV fluids, Rocephin , and morphine for pain.  Review of Systems: As mentioned in the history of present illness. All other systems reviewed and are negative. Past Medical History:  Diagnosis Date   A-fib (HCC) 03/15/2018   Acute encephalopathy 08/16/2022   Anal cancer (HCC)    Anxiety    Arthritis    Breast cancer (HCC)    Breast cancer (HCC)    Cancer of breast (HCC)    CEREBRAL EMBOLISM WITH CEREBRAL INFARCTION 03/21/2009   Qualifier: Diagnosis of   By: Wynetta REYNOLDS Ivanoff       Depression  Dysrhythmia 2010   a-fib   Hyperlipidemia    Hypertension    Ischemic stroke (HCC) 12/04/2019   PVD (peripheral vascular disease)    RAS (renal artery stenosis)    Stroke (HCC) 2019   no residual, x 2   Past Surgical History:  Procedure Laterality Date   BREAST BIOPSY Left 01/14/2003   BREAST LUMPECTOMY Right 1989   CARDIOVASCULAR STRESS TEST  07/27/2003   No evidence if LV myocardial ischemia or scar. LV EF 74%.   GYNECOLOGIC  CRYOSURGERY     IR CT HEAD LTD  12/04/2019   IR PERCUTANEOUS ART THROMBECTOMY/INFUSION INTRACRANIAL INC DIAG ANGIO  12/04/2019   IR US  GUIDE VASC ACCESS LEFT  12/04/2019   OPEN REDUCTION INTERNAL FIXATION (ORIF) DISTAL RADIAL FRACTURE Left 02/19/2017   Procedure: OPEN REDUCTION INTERNAL FIXATION (ORIF) LEFT DISTAL RADIAL FRACTURE;  Surgeon: Murrell Drivers, MD;  Location: [facility];  Service: Orthopedics;  Laterality: Left;   OVARIAN CYST REMOVAL     RADIOLOGY WITH ANESTHESIA N/A 12/04/2019   Procedure: IR WITH ANESTHESIA;  Surgeon: Dolphus Carrion, MD;  Location: [facility] OR;  Service: Radiology;  Laterality: N/A;   RENAL DOPPLER  09/09/2012   Right proximal renal artery: 60-99% diameter reduction. Normal patency of the left main renal artery.    TRANSTHORACIC ECHOCARDIOGRAM  03/22/2006   EF 55-60%, mild mitral valvular regurg, pulmonary artery systolic pressure was moderately increased.   TUBAL LIGATION     Social History:  reports that she has never smoked. She has never used smokeless tobacco. She reports current alcohol use of about 2.0 standard drinks of alcohol per week. She reports that she does not use drugs.  Allergies  Allergen Reactions   Epinephrine  Anaphylaxis    Shortness of breath and increased heart rate   Cozaar [Losartan] Other (See Comments)    increased air hunger and dizziness.   Crestor [Rosuvastatin] Other (See Comments)    memory problems.   Ezetimibe Other (See Comments)   Galantamine Hydrobromide Other (See Comments)    wt loss and dizziness   Iron Diarrhea   Pregabalin Other (See Comments)   Septra [Sulfamethoxazole-Trimethoprim] Other (See Comments)    2017 had a strange reaction she says.   Vytorin [Ezetimibe-Simvastatin] Other (See Comments)    unknown   Benazepril Hcl Cough   Crestor [Rosuvastatin Calcium ] Other (See Comments)    cognitive   Lisinopril Cough    Family History  Problem Relation Age of Onset   Alzheimer's disease Mother     Pneumonia Mother    Heart failure Father    Heart disease Sister    Heart disease Brother    Heart disease Brother    Heart disease Sister    Pneumonia Sister     Prior to Admission medications   Medication Sig Start Date End Date Taking? Authorizing Provider  metoprolol  succinate (TOPROL -XL) 100 MG 24 hr tablet Take 100 mg by mouth 2 (two) times daily. 12/07/23  Yes [provider]  apixaban  (ELIQUIS ) 2.5 MG TABS tablet Take 1 tablet (2.5 mg total) by mouth 2 (two) times daily. 11/06/23   Duke, Jon Garre, PA  ciprofloxacin  (CIPRO ) 500 MG tablet Take 1 tablet (500 mg total) by mouth every 12 (twelve) hours. 04/12/24   Freddi Hamilton, MD  Cyanocobalamin  (VITAMIN B12) 1000 MCG TBCR Take 1,000 mcg by mouth in the morning.    [provider]  escitalopram  (LEXAPRO ) 10 MG tablet Take 10 mg by mouth daily.    [provider]  nitroGLYCERIN  (NITROSTAT ) 0.4 MG SL tablet PLACE 1 TABLET (0.4 MG TOTAL) UNDER THE TONGUE EVERY 5 (FIVE) MINUTES AS NEEDED FOR CHEST PAIN. 07/13/19 02/25/24  Patwardhan, Newman PARAS, MD  propafenone  (RYTHMOL  SR) 225 MG 12 hr capsule Take 1 capsule (225 mg total) by mouth 2 (two) times daily. 02/25/24   Camnitz, Will Gladis, MD  REPATHA SURECLICK 140 MG/ML SOAJ Inject 140 mg into the skin See admin instructions. Take 140mg  SQ every two weeks per patient 10/12/19   [provider]    Physical Exam: Vitals:   04/13/24 0438 04/13/24 0440 04/13/24 0500 04/13/24 0716  BP:  (!) 182/95 (!) 177/75 135/62  Pulse:  (!) 101 90 88  Resp:  20 (!) 23 20  Temp: 98.3 F (36.8 C)   98.9 F (37.2 C)  TempSrc: Oral   Oral  SpO2:  100% 93% 100%  Weight: 57 kg     Height: 5' 2 (1.575 m)       Constitutional: Elderly female currently in no acute distress Eyes: PERRL, lids and conjunctivae normal ENMT: Mucous membranes are moist.  Normal dentition.  Neck: normal, supple  Respiratory: clear to auscultation bilaterally, no wheezing, no crackles. Normal  respiratory effort. No accessory muscle use.  Cardiovascular: Regular rate and rhythm, no murmurs / rubs / gallops. No extremity edema. 2+ pedal pulses.    Abdomen: Left flank pain present.  Bowel sounds positive.  Musculoskeletal: no clubbing / cyanosis. No joint deformity upper and lower extremities. Good ROM, no contractures. Normal muscle tone.  Skin: no rashes, lesions, ulcers. No induration Neurologic: CN 2-12 grossly intact. Sensation intact, DTR normal. Strength 5/5 in all 4.  Psychiatric: Normal judgment and insight. Alert and oriented x 3. Normal mood.   Data Reviewed:  EKG revealed normal sinus rhythm at 82 bpm.  Reviewed labs, imaging, and pertinent records as documented.  Assessment and Plan:  Sepsis secondary to pyelonephritis with nephrolithiasis Acute.  Patient presents with at least 2-day history of discomfort with urination and an subsequent left flank pain.  Patient was noted to be tachycardic, tachypneic, and have leukocytosis meeting SIRS criteria. Urinalysis obtain yesterday noted large leukocytes, positive nitrites, few bacteria, 21-50 WBCs/hpf, 11-20 squamous epithelial cells/hpf, and greater than 50 WBCs. CT scan of the abdomen pelvis revealed 5 mm stone in the left kidney posterior and lateral to lower pole calyx resulting in calyceal obstruction, hypoenhancement of the affected overlying renal cortex, and asymmetric wall thickening and enhancement involving the left renal pelvis up to the left UPJ.   Urine culture was obtained yesterday was pending.  Patient had received a dose of Rocephin  IV yesterday and was given an additional dose today along with 1 L of IV fluid.  Review of records no prior history of resistant bacteria, but most recent cultures were susceptible to Rocephin .  Urology had not recommended any need of acute intervention in regards to the stone present. - Admit to a telemetry bed - Follow-up urine cultures - Add-on lactic acid(0.7) - Continue Rocephin   IV - Hydrocodone /fentanyl  IV as needed for moderate to severe pain respectively  Acute respiratory failure with hypoxia O2 saturations were noted to drop down to 80%, but patient denies any significant reports of shortness of breath.  She was placed on 2 L nasal cannula oxygen.  Possibly iatrogenic in nature related to pain medications given. - Continuous pulse oximetry with nasal cannula oxygen to maintain O2 saturation greater than 90% - Incentive spirometry - Check portable chest  x-ray(bibasilar linear atelectasis or scarring without any other acute findings)  Nausea and vomiting Patient reports having episode of vomiting prior to arrival.  Thought secondary to pyelonephritis. - Aspiration precautions with elevation head of bed - Antiemetics as needed  Normocytic anemia Acute.  Hemoglobin noted to be 11.1 which appears slightly lower than baseline which previously had been 12.6 when checked 03/20/2024. - Continue to monitor H&H  Paroxysmal atrial fibrillation on chronic anticoagulation Patient was noted to be in sinus rhythm. - Continue Eliquis  and propafenone   Anxiety and depression - Continue Lexapro   History of CVA Patient does not report any significant deficit as result of prior stroke.  She does not appear to be on Repatha anymore.  History of rectal and breast cancer  S/p radiation treatment for rectal cancer. S/p lumpectomy for history of breast cancer  DVT prophylaxis: Eliquis  Advance Care Planning:   Code Status: Full Code   Consults: Urology  Family Communication: Husband updated over the phone  Severity of Illness: The appropriate patient status for this patient is INPATIENT. Inpatient status is judged to be reasonable and necessary in order to provide the required intensity of service to ensure the patient's safety. The patient's presenting symptoms, physical exam findings, and initial radiographic and laboratory data in the context of their chronic comorbidities  is felt to place them at high risk for further clinical deterioration. Furthermore, it is not anticipated that the patient will be medically stable for discharge from the hospital within 2 midnights of admission.   * I certify that at the point of admission it is my clinical judgment that the patient will require inpatient hospital care spanning beyond 2 midnights from the point of admission due to high intensity of service, high risk for further deterioration and high frequency of surveillance required.*  Author: Maximino DELENA Sharps, MD 04/13/2024 8:03 AM  For on call review [email or web address].

## 2024-04-13 NOTE — ED Triage Notes (Signed)
 Patient is coming from home. Called out for left flank pain. Was seen at [facility] and was diagnosed with a UTI. Patient received IV antibiotics. Was d/c with a prescription for PO antibiotics, but has not been able to pick them up. Patient had one episode of emesis with EMS, bile content. EMS gave 4mg  of zofran  and 500ml of fluids. Hx of afib EMS VS 80-90 HR 180/80 BP 95% RA

## 2024-04-13 NOTE — ED Provider Notes (Signed)
 [place] EMERGENCY DEPARTMENT AT [facility] Provider Note   CSN: [id number] Arrival date & time: 04/13/24  9570     Patient presents with: Flank Pain   Jeanette [name] is a 89 y.o. female with past medical history of A-fib, diastolic heart failure, chronic cystitis, who presents to the emergency department for evaluation of flank pain.  Patient was seen at [facility] emergency department last night for evaluation of the same complaint.  She was given a gram of IV Rocephin  at that time and discharged with p.o. Cipro .  However, upon speaking with her today she states she was unable to get to the pharmacy to pick up her prescription so her last dose of antibiotics was in the ER yesterday.  Patient reports 1 episode of emesis at home.  She currently denies any nausea or additional vomiting.  She reports abdominal pain as well as flank pain.  She denies chest pain or shortness of breath.   Flank Pain       Prior to Admission medications   Medication Sig Start Date End Date Taking? Authorizing Provider  ACETAMINOPHEN  ER PO Take 500 mg by mouth 2 (two) times daily.    [provider]  apixaban  (ELIQUIS ) 2.5 MG TABS tablet Take 1 tablet (2.5 mg total) by mouth 2 (two) times daily. 11/06/23   Duke, Jon Garre, PA  ciprofloxacin  (CIPRO ) 500 MG tablet Take 1 tablet (500 mg total) by mouth every 12 (twelve) hours. 04/12/24   Freddi Hamilton, MD  escitalopram  (LEXAPRO ) 10 MG tablet Take 10 mg by mouth daily.    [provider]  fosfomycin (MONUROL ) 3 g PACK Take by mouth. Takes 1 dose every 7 days for UTI's 11/16/23   [provider]  nitroGLYCERIN  (NITROSTAT ) 0.4 MG SL tablet PLACE 1 TABLET (0.4 MG TOTAL) UNDER THE TONGUE EVERY 5 (FIVE) MINUTES AS NEEDED FOR CHEST PAIN. 07/13/19 02/25/24  Patwardhan, Newman PARAS, MD  propafenone  (RYTHMOL  SR) 225 MG 12 hr capsule Take 1 capsule (225 mg total) by mouth 2 (two) times daily. 02/25/24   Camnitz, Will Gladis, MD  REPATHA  SURECLICK 140 MG/ML SOAJ Inject 140 mg into the skin See admin instructions. Take 140mg  SQ every two weeks per patient 10/12/19   [provider]  VITAMIN D  PO Take 1 tablet by mouth daily.    [provider]    Allergies: Epinephrine , Cozaar [losartan], Crestor [rosuvastatin], Ezetimibe, Galantamine hydrobromide, Iron, Pregabalin, Septra [sulfamethoxazole-trimethoprim], Vytorin [ezetimibe-simvastatin], Benazepril hcl, Crestor [rosuvastatin calcium ], and Lisinopril    Review of Systems  Genitourinary:  Positive for flank pain.    Updated Vital Signs BP (!) 182/95   Pulse (!) 101   Temp 98.3 F (36.8 C) (Oral)   Resp 20   Ht 5' 2 (1.575 m)   Wt 57 kg   SpO2 100%   BMI 22.98 kg/m   Physical Exam Vitals and nursing note reviewed.  Constitutional:      Appearance: Normal appearance.  HENT:     Head: Normocephalic and atraumatic.     Mouth/Throat:     Mouth: Mucous membranes are moist.  Eyes:     General: No scleral icterus.       Right eye: No discharge.        Left eye: No discharge.     Conjunctiva/sclera: Conjunctivae normal.  Cardiovascular:     Rate and Rhythm: Tachycardia present. Rhythm irregular.     Pulses: Normal pulses.  Pulmonary:     Effort:  Pulmonary effort is normal.     Breath sounds: Normal breath sounds.  Abdominal:     General: There is distension.     Tenderness: There is abdominal tenderness. There is left CVA tenderness.     Comments: Right upper quadrant tenderness.  Mild distention.  Left CVA tenderness.  Musculoskeletal:        General: No deformity.     Cervical back: Normal range of motion.  Skin:    General: Skin is warm and dry.     Capillary Refill: Capillary refill takes less than 2 seconds.  Neurological:     Mental Status: She is alert.     Motor: No weakness.  Psychiatric:        Mood and Affect: Mood normal.     (all labs ordered are listed, but only abnormal results are displayed) Labs Reviewed  CBC -  Abnormal; Notable for the following components:      Result Value   WBC 12.0 (*)    RBC 3.60 (*)    Hemoglobin 11.1 (*)    HCT 35.3 (*)    All other components within normal limits  BASIC METABOLIC PANEL WITH GFR - Abnormal; Notable for the following components:   Glucose, Bld 143 (*)    All other components within normal limits  URINE CULTURE  URINALYSIS, ROUTINE W REFLEX MICROSCOPIC    EKG: None  Radiology: CT ABDOMEN PELVIS W CONTRAST Result Date: 04/13/2024 EXAM: CT ABDOMEN AND PELVIS WITH CONTRAST 04/13/2024 05:18:55 AM TECHNIQUE: CT of the abdomen and pelvis was performed with the administration of 75 mL of iohexol  (OMNIPAQUE ) 350 MG/ML injection. Multiplanar reformatted images are provided for review. Automated exposure control, iterative reconstruction, and/or weight-based adjustment of the mA/kV was utilized to reduce the radiation dose to as low as reasonably achievable. COMPARISON: 11/19/2022 CLINICAL HISTORY: Abdominal pain, acute, nonlocalized. left flank pain. Was seen at [facility] and was diagnosed with a UTI. FINDINGS: LOWER CHEST: Scarring identified within both lung bases. LIVER: Unchanged liver cysts. The largest is in the posterior right lobe measuring 1.4 cm, image 16/3. GALLBLADDER AND BILE DUCTS: Gallbladder is unremarkable. No biliary ductal dilatation. SPLEEN: No acute abnormality. PANCREAS: No acute abnormality. ADRENAL GLANDS: No acute abnormality. KIDNEYS, URETERS AND BLADDER: Right kidney: Upper pole cyst measures 1.6 cm, image 27/3. No follow-up imaging recommended. No stones in the right kidney or ureter. No right hydronephrosis. No right perinephric or periureteral stranding. Left kidney: Small stones identified within the lower pole collecting system. A 5 mm stone is identified within the posterior lateral lower pole calyceal infundibulum with associated dilatation of the posterior lower pole calyces. Peripheral wedge-shaped hypoenhancement is  identified involving the affected lower pole renal cortex. Asymmetric wall thickening involving the left renal pelvis up to the left UPJ is noted. No left ureteral calculi identified. No left perinephric or periureteral stranding. Urinary bladder: Urinary bladder appears normal. GI AND BOWEL: Stomach demonstrates no acute abnormality. Sigmoid diverticulosis without signs of acute diverticulitis. There is no bowel obstruction. PERITONEUM AND RETROPERITONEUM: No ascites. No free air. VASCULATURE: Aorta is normal in caliber. Aortic atherosclerosis. LYMPH NODES: No lymphadenopathy. REPRODUCTIVE ORGANS: Uterus and adnexal structures are unremarkable. BONES AND SOFT TISSUES: Lumbar degenerative disc disease. No acute osseous abnormality. No focal soft tissue abnormality. IMPRESSION: 1. 5 mm stone is identified within the left kidney posterior and lateral lower pole calyceal infundibulum resulting in calyceal obstruction and hypoenhancement of the affected overlying renal cortex. 2. Asymmetric wall thickening and enhancement involving  the left renal pelvis up to the left UPJ this is favored to represent sequelae of urinary tract infection. Follow-up imaging is advised to ensure resolution and to exclude underlying urothelial neoplasm. 3. Sigmoid diverticulosis without signs of acute diverticulitis. 4. Aortic atherosclerotic calcification. Electronically signed by: Waddell Calk MD 04/13/2024 05:36 AM EDT RP Workstation: [id number]     .Critical Care  Performed by: Torrence Marry RAMAN, PA-C Authorized by: Torrence Marry RAMAN, PA-C   Critical care provider statement:    Critical care time (minutes):  31   Critical care was necessary to treat or prevent imminent or life-threatening deterioration of the following conditions:  Respiratory failure   Critical care was time spent personally by me on the following activities:  Blood draw for specimens, development of treatment plan with patient or surrogate, discussions  with consultants, pulse oximetry, ordering and review of radiographic studies, ordering and review of laboratory studies, ordering and performing treatments and interventions, examination of patient, evaluation of patient's response to treatment and discussions with primary provider   Care discussed with: admitting provider      Medications Ordered in the ED  cefTRIAXone  (ROCEPHIN ) 1 g in sodium chloride  0.9 % 100 mL IVPB (1 g Intravenous New Bag/Given 04/13/24 0527)  sodium chloride  0.9 % bolus 1,000 mL (1,000 mLs Intravenous New Bag/Given 04/13/24 0526)  morphine (PF) 4 MG/ML injection 4 mg (4 mg Intravenous Given 04/13/24 0524)  iohexol  (OMNIPAQUE ) 350 MG/ML injection 75 mL (75 mLs Intravenous Contrast Given 04/13/24 0520)                                 Medical Decision Making Amount and/or Complexity of Data Reviewed Labs: ordered. Radiology: ordered.  Risk Prescription drug management.   This patient presents to the ED for concern of flank pain, this involves an extensive number of treatment options, and is a complaint that carries with it a high risk of complications and morbidity.  Differential diagnosis includes: UTI, pyelonephritis, nephrolithiasis, sepsis, kidney stones with obstruction, renal or perinephric abscess, diverticulitis  Co morbidities: history of multiple UTIs   Additional history:  Patient seen at [facility] emergency department yesterday and diagnosed with UTI.  Given dose of IV Rocephin  in the ED and discharged with prescription for Cipro .  Lab Tests:  I Ordered, and personally interpreted labs.  The pertinent results include:    - WBC: 12  Imaging Studies:  I ordered imaging studies including CT abdomen pelvis I independently visualized and interpreted imaging which showed   1. 5 mm stone is identified within the left kidney posterior and lateral lower  pole calyceal infundibulum resulting in calyceal obstruction and  hypoenhancement of the  affected overlying renal cortex.  2. Asymmetric wall thickening and enhancement involving the left renal pelvis  up to the left UPJ this is favored to represent sequelae of urinary tract  infection. Follow-up imaging is advised to ensure resolution and to exclude  underlying urothelial neoplasm.   I agree with the radiologist interpretation  Cardiac Monitoring/ECG:  The patient was maintained on a cardiac monitor.  I personally viewed and interpreted the cardiac monitored which showed an underlying rhythm of: A-fib  Medicines ordered and prescription drug management:  I ordered medication including  Medications  cefTRIAXone  (ROCEPHIN ) 1 g in sodium chloride  0.9 % 100 mL IVPB (1 g Intravenous New Bag/Given 04/13/24 0527)  sodium chloride  0.9 % bolus 1,000 mL (1,000 mLs Intravenous New  Bag/Given 04/13/24 0526)  morphine (PF) 4 MG/ML injection 4 mg (4 mg Intravenous Given 04/13/24 0524)  iohexol  (OMNIPAQUE ) 350 MG/ML injection 75 mL (75 mLs Intravenous Contrast Given 04/13/24 0520)   for known UTI and pain control Reevaluation of the patient after these medicines showed that the patient improved I have reviewed the patients home medicines and have made adjustments as needed  Test Considered:   none  Critical Interventions:  supplemental O2 administration  Consultations Obtained: - Urology - Hospitalist  Problem List / ED Course:     ICD-10-CM   1. Pyelonephritis  N12       MDM: Patient is an 89 year old female with past medical history significant for A-fib, diastolic heart failure, who presents to the emergency department for evaluation of flank and abdominal pain.  Patient was seen in the [facility] emergency department yesterday and was found to have a UTI at that time.  Per chart review, she was given a dose of IV Rocephin  and discharged home with p.o. Cipro .  Patient has been unable to obtain her Cipro  since discharge so her last dose of antibiotics was yesterday.  WBC 12  compared to 7.4 yesterday.  BMP pending.  UA ordered.  Will start patient on IV Rocephin  and fluids.  Given nonspecific symptom presentation, CT chest abdomen pelvis ordered to rule out other causes of pain. CT reveals 5mm stone within left kidney resulting in calyceal obstruction and hypoenhancement of affected overlying renal cortex. Asymmetric wall thickening and enhancement involving the left renal pelvis to left UPJ is favored to represent sequelae of UTI. Upon reevaluation, nursing staff informed me patient's pulse oximetry dropped to the low 80s. Patient without complaints of shortness of breath. Patient placed on 2L nasal cannula and SPO2 returned to 98%.  Consult to urology and medicine placed.  Patient will be admitted to medicine with urology consultation.  Dispostion:  After consideration of the diagnostic results and the patients response to treatment, I feel that the patient would benefit from inpatient admission for further management of pyelonephritis.    Final diagnoses:  Pyelonephritis    ED Discharge Orders     None          Torrence Marry GORMAN DEVONNA 04/13/24 [id number]    Theadore Ozell HERO, MD 04/13/24 [phone]

## 2024-04-13 NOTE — ED Notes (Signed)
 Introduced self to patient at this time. Patient is resting in bed with visible chest rise and fall. The call light is in reach. There are no further requests at this time.

## 2024-04-13 NOTE — ED Notes (Signed)
 Encouraged patient to provide urine sample. Patient stated they do not have to urinate at this time.

## 2024-04-13 NOTE — Consult Note (Signed)
 Urology Consult Note   Requesting Attending Physician:  Claudene Maximino LABOR, MD Service Providing Consult: Urology  Consulting Attending: Dr. Shane   Reason for Consult: Obstructing  HPI: Jeanette [name] is seen in consultation for reasons noted above at the request of Claudene Maximino LABOR, MD. Patient is a 89 y.o. female presenting with flank pain.  She was seen at [facility] emergency department last night for evaluation of same complaint.  She was prescribed antibiotic which she has not had an opportunity to pick up yet.  I met with patient and her husband.  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  In reviewing her chart she has no concerning signs for systemic infectious process and renal function is preserved.  We discussed possible interventions at length and all questions were answered to their satisfaction.  ------------------  Assessment:   89 y.o. female with 5 mm stone in posterior lateral lower pole calyceal infundibulum with associated dilation of lower pole.  5 mm stone in posterior lateral lower pole calyceal infundibulum with associated dilation.  Reported UTI based off contaminated sample   Recommendations:  # Left calyceal stone  Watchful waiting.  Patient has no fever, mild leukocytosis, preserved renal function, and a contaminated urinalysis.  She remains on broad ABX.  No need for surgical intervention in the short-term.  She is followed by Dr. Nicholaus of [facility].  Should they decide to they are welcome to review this with him.  They have expressed a desire to transfer care to a local practice and our contact information was provided.  # Chronic urinary tract infections  Patient reports long standing UTIs following radiation approximately 30 years ago for anal cancer.  She has had a significant amount of complications following this treatment including but not limited to chronic diarrhea.  We discussed the natural history of  urinary tract infections, the likely involvement of her anal leakage, the change in vaginal pH based on her age, and role of suppressive antibiotic treatment.  She is scheduled to follow-up with Dr. Nicholaus this month, again we are happy to see her should she need us .  Should patient decompensate acutely while in hospital we will reevaluate surgical intervention.  Please call with questions.  Case and plan discussed with Dr. 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 LV EF 74%.   GYNECOLOGIC CRYOSURGERY     IR CT HEAD LTD  12/04/2019   IR PERCUTANEOUS ART THROMBECTOMY/INFUSION INTRACRANIAL INC DIAG ANGIO  12/04/2019   IR  US  GUIDE VASC ACCESS LEFT  12/04/2019   OPEN REDUCTION INTERNAL FIXATION (ORIF) DISTAL RADIAL FRACTURE Left 02/19/2017   Procedure: OPEN REDUCTION INTERNAL FIXATION (ORIF) LEFT DISTAL RADIAL FRACTURE;  Surgeon: Murrell Drivers, MD;  Location: [facility];  Service: Orthopedics;  Laterality: Left;   OVARIAN CYST REMOVAL     RADIOLOGY WITH ANESTHESIA N/A 12/04/2019   Procedure: IR WITH ANESTHESIA;  Surgeon: Dolphus Carrion, MD;  Location: [facility] OR;  Service: Radiology;  Laterality: N/A;   RENAL DOPPLER   09/09/2012   Right proximal renal artery: 60-99% diameter reduction. Normal patency of the left main renal artery.    TRANSTHORACIC ECHOCARDIOGRAM  03/22/2006   EF 55-60%, mild mitral valvular regurg, pulmonary artery systolic pressure was moderately increased.   TUBAL LIGATION      Medication: Current Facility-Administered Medications  Medication Dose Route Frequency Provider Last Rate Last Admin   acetaminophen  (TYLENOL ) suppository 650 mg  650 mg Rectal Q6H PRN Smith, Rondell A, MD       apixaban  (ELIQUIS ) tablet 2.5 mg  2.5 mg Oral BID Claudene, Rondell A, MD   2.5 mg at 04/13/24 1037   [START ON 04/14/2024] cefTRIAXone  (ROCEPHIN ) 1 g in sodium chloride  0.9 % 100 mL IVPB  1 g Intravenous Q24H Smith, Rondell A, MD       escitalopram  (LEXAPRO ) tablet 10 mg  10 mg Oral Daily Claudene, Rondell A, MD   10 mg at 04/13/24 1037   fentaNYL  (SUBLIMAZE ) injection 25 mcg  25 mcg Intravenous Q2H PRN Smith, Rondell A, MD       HYDROcodone -acetaminophen  (NORCO/VICODIN) 5-325 MG per tablet 1 tablet  1 tablet Oral Q6H PRN Smith, Rondell A, MD       prochlorperazine  (COMPAZINE ) injection 5 mg  5 mg Intravenous Q6H PRN Opyd, Timothy S, MD       propafenone  (RYTHMOL  SR) 12 hr capsule 225 mg  225 mg Oral BID Smith, Rondell A, MD   225 mg at 04/13/24 1109   sodium chloride  flush (NS) 0.9 % injection 3 mL  3 mL Intravenous Q12H Smith, Rondell A, MD   3 mL at 04/13/24 1004    Allergies: Allergies  Allergen Reactions   Epinephrine  Anaphylaxis    Shortness of breath and increased heart rate   Benazepril Hcl Cough   Cozaar [Losartan] Other (See Comments)    increased air hunger and dizziness.   Crestor [Rosuvastatin Calcium ] Other (See Comments)    cognitive   Galantamine Hydrobromide Other (See Comments)    wt loss and dizziness   Iron Diarrhea   Lisinopril Cough   Septra [Sulfamethoxazole-Trimethoprim] Other (See Comments)    2017 had a strange reaction she says.    Social History: Social History    Tobacco Use   Smoking status: Never   Smokeless tobacco: Never  Vaping Use   Vaping status: Never Used  Substance Use Topics   Alcohol use: Yes    Alcohol/week: 2.0 standard drinks of alcohol    Types: 2 Glasses of wine per week    Comment: rare   Drug use: No    Family History Family History  Problem Relation Age of Onset   Alzheimer's disease Mother    Pneumonia Mother    Heart failure Father    Heart disease Sister    Heart disease Brother    Heart disease Brother    Heart disease Sister    Pneumonia Sister     Review of Systems  Gastrointestinal:  Positive for  diarrhea (chronic).  Genitourinary:  Negative for dysuria, flank pain, frequency, hematuria and urgency.       Frequent UTIs     Objective   Vital signs in last 24 hours: BP 135/62 (BP Location: Right Arm)   Pulse 88   Temp 98.9 F (37.2 C) (Oral)   Resp 20   Ht 5' 2 (1.575 m)   Wt 57 kg   SpO2 100%   BMI 22.98 kg/m   Physical Exam General: A&O, resting, appropriate HEENT: [place]/AT Pulmonary: Normal work of breathing Cardiovascular: no cyanosis    Most Recent Labs: Lab Results  Component Value Date   WBC 12.0 (H) 04/13/2024   HGB 11.1 (L) 04/13/2024   HCT 35.3 (L) 04/13/2024   PLT 204 04/13/2024    Lab Results  Component Value Date   NA 136 04/13/2024   K 4.1 04/13/2024   CL 103 04/13/2024   CO2 25 04/13/2024   BUN 8 04/13/2024   CREATININE 0.64 04/13/2024   CALCIUM  10.0 04/13/2024   MG 1.9 11/05/2023   PHOS 2.3 (L) 11/21/2022    Lab Results  Component Value Date   INR 1.3 (H) 10/12/2023   APTT 36 12/04/2019     Urine Culture: @[id number] (laburin,org,[id number])@   IMAGING: DG CHEST PORT 1 VIEW Result Date: 04/13/2024 CLINICAL DATA:  Hypoxia. EXAM: PORTABLE CHEST 1 VIEW COMPARISON:  03/15/2024 and CT chest 06/18/2007. FINDINGS: Trachea is midline. Heart size normal. Thoracic aorta is calcified. Bibasilar linear atelectasis or scarring. No airspace consolidation or  pleural fluid. IMPRESSION: Bibasilar linear atelectasis or scarring.  No acute findings. Electronically Signed   By: Newell Eke M.D.   On: 04/13/2024 09:13   CT ABDOMEN PELVIS W CONTRAST Result Date: 04/13/2024 EXAM: CT ABDOMEN AND PELVIS WITH CONTRAST 04/13/2024 05:18:55 AM TECHNIQUE: CT of the abdomen and pelvis was performed with the administration of 75 mL of iohexol  (OMNIPAQUE ) 350 MG/ML injection. Multiplanar reformatted images are provided for review. Automated exposure control, iterative reconstruction, and/or weight-based adjustment of the mA/kV was utilized to reduce the radiation dose to as low as reasonably achievable. COMPARISON: 11/19/2022 CLINICAL HISTORY: Abdominal pain, acute, nonlocalized. left flank pain. Was seen at [facility] and was diagnosed with a UTI. FINDINGS: LOWER CHEST: Scarring identified within both lung bases. LIVER: Unchanged liver cysts. The largest is in the posterior right lobe measuring 1.4 cm, image 16/3. GALLBLADDER AND BILE DUCTS: Gallbladder is unremarkable. No biliary ductal dilatation. SPLEEN: No acute abnormality. PANCREAS: No acute abnormality. ADRENAL GLANDS: No acute abnormality. KIDNEYS, URETERS AND BLADDER: Right kidney: Upper pole cyst measures 1.6 cm, image 27/3. No follow-up imaging recommended. No stones in the right kidney or ureter. No right hydronephrosis. No right perinephric or periureteral stranding. Left kidney: Small stones identified within the lower pole collecting system. A 5 mm stone is identified within the posterior lateral lower pole calyceal infundibulum with associated dilatation of the posterior lower pole calyces. Peripheral wedge-shaped hypoenhancement is identified involving the affected lower pole renal cortex. Asymmetric wall thickening involving the left renal pelvis up to the left UPJ is noted. No left ureteral calculi identified. No left perinephric or periureteral stranding. Urinary bladder: Urinary bladder appears  normal. GI AND BOWEL: Stomach demonstrates no acute abnormality. Sigmoid diverticulosis without signs of acute diverticulitis. There is no bowel obstruction. PERITONEUM AND RETROPERITONEUM: No ascites. No free air. VASCULATURE: Aorta is normal in caliber. Aortic atherosclerosis. LYMPH NODES: No lymphadenopathy. REPRODUCTIVE ORGANS: Uterus and adnexal structures are unremarkable. BONES AND SOFT TISSUES: Lumbar degenerative  disc disease. No acute osseous abnormality. No focal soft tissue abnormality. IMPRESSION: 1. 5 mm stone is identified within the left kidney posterior and lateral lower pole calyceal infundibulum resulting in calyceal obstruction and hypoenhancement of the affected overlying renal cortex. 2. Asymmetric wall thickening and enhancement involving the left renal pelvis up to the left UPJ this is favored to represent sequelae of urinary tract infection. Follow-up imaging is advised to ensure resolution and to exclude underlying urothelial neoplasm. 3. Sigmoid diverticulosis without signs of acute diverticulitis. 4. Aortic atherosclerotic calcification. Electronically signed by: Waddell Calk MD 04/13/2024 05:36 AM EDT RP Workstation: [id number]    ------  Ole Bourdon, NP Pager: [phone]   Please contact the urology consult pager with any further questions/concerns.

## 2024-04-14 ENCOUNTER — Inpatient Hospital Stay (HOSPITAL_COMMUNITY)

## 2024-04-14 DIAGNOSIS — J9601 Acute respiratory failure with hypoxia: Secondary | ICD-10-CM | POA: Diagnosis not present

## 2024-04-14 DIAGNOSIS — I5032 Chronic diastolic (congestive) heart failure: Secondary | ICD-10-CM | POA: Diagnosis not present

## 2024-04-14 DIAGNOSIS — N12 Tubulo-interstitial nephritis, not specified as acute or chronic: Secondary | ICD-10-CM | POA: Diagnosis not present

## 2024-04-14 DIAGNOSIS — A419 Sepsis, unspecified organism: Secondary | ICD-10-CM | POA: Diagnosis not present

## 2024-04-14 LAB — CBC
HCT: 31.6 % — ABNORMAL LOW (ref 36.0–46.0)
Hemoglobin: 10 g/dL — ABNORMAL LOW (ref 12.0–15.0)
MCH: 30.4 pg (ref 26.0–34.0)
MCHC: 31.6 g/dL (ref 30.0–36.0)
MCV: 96 fL (ref 80.0–100.0)
Platelets: 175 K/uL (ref 150–400)
RBC: 3.29 MIL/uL — ABNORMAL LOW (ref 3.87–5.11)
RDW: 13.8 % (ref 11.5–15.5)
WBC: 12.7 K/uL — ABNORMAL HIGH (ref 4.0–10.5)
nRBC: 0 % (ref 0.0–0.2)

## 2024-04-14 LAB — URINALYSIS, ROUTINE W REFLEX MICROSCOPIC
Bilirubin Urine: NEGATIVE
Glucose, UA: NEGATIVE mg/dL
Ketones, ur: NEGATIVE mg/dL
Nitrite: NEGATIVE
Protein, ur: NEGATIVE mg/dL
Specific Gravity, Urine: 1.009 (ref 1.005–1.030)
WBC, UA: >50 WBC/hpf (ref 0–5)
pH: 6 (ref 5.0–8.0)

## 2024-04-14 LAB — BASIC METABOLIC PANEL WITH GFR
Anion gap: 6 (ref 5–15)
BUN: 6 mg/dL — ABNORMAL LOW (ref 8–23)
CO2: 26 mmol/L (ref 22–32)
Calcium: 9.8 mg/dL (ref 8.9–10.3)
Chloride: 103 mmol/L (ref 98–111)
Creatinine, Ser: 0.78 mg/dL (ref 0.44–1.00)
GFR, Estimated: >60 mL/min (ref >60)
Glucose, Bld: 109 mg/dL — ABNORMAL HIGH (ref 70–99)
Potassium: 3.6 mmol/L (ref 3.5–5.1)
Sodium: 135 mmol/L (ref 135–145)

## 2024-04-14 MED ORDER — METOPROLOL TARTRATE 5 MG/5ML IV SOLN
2.5000 mg | Freq: Once | INTRAVENOUS | Status: DC
  Administered 2024-04-14: 2.5 mg via INTRAVENOUS
  Filled 2024-04-14: qty 5

## 2024-04-14 MED ORDER — METOPROLOL SUCCINATE ER 25 MG PO TB24
25.0000 mg | ORAL_TABLET | Freq: Every day | ORAL | Status: DC
  Administered 2024-04-14 – 2024-04-16 (×3): 25 mg via ORAL
  Filled 2024-04-14 (×3): qty 1

## 2024-04-14 MED ORDER — ACETAMINOPHEN 325 MG PO TABS
650.0000 mg | ORAL_TABLET | Freq: Four times a day (QID) | ORAL | Status: DC | PRN
  Administered 2024-04-14: 325 mg via ORAL
  Filled 2024-04-14: qty 2

## 2024-04-14 NOTE — Progress Notes (Incomplete)
 Pt refused to take her rhytmol med

## 2024-04-14 NOTE — Progress Notes (Addendum)
 Subjective: No acute events overnight.  Patient resting comfortably without pain on rounds this morning.  Discussed various surgical interventions and all of her questions were answered.  Objective: Vital signs in last 24 hours: Temp:  [97.9 F (36.6 C)-98.8 F (37.1 C)] 98.8 F (37.1 C) (10/07 0749) Pulse Rate:  [84-110] 85 (10/07 0749) Resp:  [17-22] 22 (10/07 0749) BP: (130-133)/(61-65) 132/61 (10/07 0749) SpO2:  [97 %-100 %] 97 % (10/07 0749)  Assessment/Plan: # Left calyceal stone  KUB was not able to directly visualize stone.  This excluded ESWL as an option.  Noted that this may be obscured by bowel gas, may have better luck on repeat KUB in the future.  Considering patient's chronic colonization, would refer her to the office for preprocedural culture, ABX as indicated, and scheduling of URS.  She was amenable to either plan.  Patient is pain-free today, remains afebrile, mild leukocytosis, with preserved renal function.  UCx reincubated for better growth.  Hx of Proteus and Klebsiella UTI.  Unfortunately she reports a previous reaction to Bactrim.  Awaiting Ucx.   Okay for discharge once medically clear.   Intake/Output from previous day: 10/06 0701 - 10/07 0700 In: 120 [P.O.:120] Out: -   Intake/Output this shift: No intake/output data recorded.  Physical Exam:  General: Alert and oriented CV: No cyanosis Lungs: equal chest rise   Lab Results: Recent Labs    04/12/24 1057 04/13/24 0434 04/14/24 0231  HGB 10.7* 11.1* 10.0*  HCT 34.9* 35.3* 31.6*   BMET Recent Labs    04/13/24 0434 04/14/24 0231  NA 136 135  K 4.1 3.6  CL 103 103  CO2 25 26  GLUCOSE 143* 109*  BUN 8 6*  CREATININE 0.64 0.78  CALCIUM  10.0 9.8  HGB 11.1* 10.0*  WBC 12.0* 12.7*     Studies/Results: DG Abd 1 View Result Date: 04/14/2024 EXAM: 1 VIEW XRAY OF THE ABDOMEN 04/14/2024 05:39:52 AM COMPARISON: CT from 04/13/2024. CLINICAL HISTORY: Ureteral stone, Pyelonephritis  of left kidney, rover. FINDINGS: BOWEL: Nonobstructive bowel gas pattern. SOFT TISSUES: The left kidney stone noted on the CT from prior day is not confidently identified. This may be obscured by overlying bowel gas. Pelvic phleboliths. Atherosclerotic calcifications. BONES: Degenerative changes of the lumbar spine. No acute osseous abnormality. IMPRESSION: 1. No acute abdominal radiographic abnormality. 2. Previously noted left renal calculus on prior CT is not confidently identified on this radiograph, and may be obscured by overlying bowel gas. Electronically signed by: Waddell Calk MD 04/14/2024 07:19 AM EDT RP Workstation: [id number]   DG CHEST PORT 1 VIEW Result Date: 04/13/2024 CLINICAL DATA:  Hypoxia. EXAM: PORTABLE CHEST 1 VIEW COMPARISON:  03/15/2024 and CT chest 06/18/2007. FINDINGS: Trachea is midline. Heart size normal. Thoracic aorta is calcified. Bibasilar linear atelectasis or scarring. No airspace consolidation or pleural fluid. IMPRESSION: Bibasilar linear atelectasis or scarring.  No acute findings. Electronically Signed   By: Newell Eke M.D.   On: 04/13/2024 09:13   CT ABDOMEN PELVIS W CONTRAST Result Date: 04/13/2024 EXAM: CT ABDOMEN AND PELVIS WITH CONTRAST 04/13/2024 05:18:55 AM TECHNIQUE: CT of the abdomen and pelvis was performed with the administration of 75 mL of iohexol  (OMNIPAQUE ) 350 MG/ML injection. Multiplanar reformatted images are provided for review. Automated exposure control, iterative reconstruction, and/or weight-based adjustment of the mA/kV was utilized to reduce the radiation dose to as low as reasonably achievable. COMPARISON: 11/19/2022 CLINICAL HISTORY: Abdominal pain, acute, nonlocalized. left flank pain. Was seen at [facility]  and was diagnosed with a UTI. FINDINGS: LOWER CHEST: Scarring identified within both lung bases. LIVER: Unchanged liver cysts. The largest is in the posterior right lobe measuring 1.4 cm, image 16/3. GALLBLADDER AND BILE  DUCTS: Gallbladder is unremarkable. No biliary ductal dilatation. SPLEEN: No acute abnormality. PANCREAS: No acute abnormality. ADRENAL GLANDS: No acute abnormality. KIDNEYS, URETERS AND BLADDER: Right kidney: Upper pole cyst measures 1.6 cm, image 27/3. No follow-up imaging recommended. No stones in the right kidney or ureter. No right hydronephrosis. No right perinephric or periureteral stranding. Left kidney: Small stones identified within the lower pole collecting system. A 5 mm stone is identified within the posterior lateral lower pole calyceal infundibulum with associated dilatation of the posterior lower pole calyces. Peripheral wedge-shaped hypoenhancement is identified involving the affected lower pole renal cortex. Asymmetric wall thickening involving the left renal pelvis up to the left UPJ is noted. No left ureteral calculi identified. No left perinephric or periureteral stranding. Urinary bladder: Urinary bladder appears normal. GI AND BOWEL: Stomach demonstrates no acute abnormality. Sigmoid diverticulosis without signs of acute diverticulitis. There is no bowel obstruction. PERITONEUM AND RETROPERITONEUM: No ascites. No free air. VASCULATURE: Aorta is normal in caliber. Aortic atherosclerosis. LYMPH NODES: No lymphadenopathy. REPRODUCTIVE ORGANS: Uterus and adnexal structures are unremarkable. BONES AND SOFT TISSUES: Lumbar degenerative disc disease. No acute osseous abnormality. No focal soft tissue abnormality. IMPRESSION: 1. 5 mm stone is identified within the left kidney posterior and lateral lower pole calyceal infundibulum resulting in calyceal obstruction and hypoenhancement of the affected overlying renal cortex. 2. Asymmetric wall thickening and enhancement involving the left renal pelvis up to the left UPJ this is favored to represent sequelae of urinary tract infection. Follow-up imaging is advised to ensure resolution and to exclude underlying urothelial neoplasm. 3. Sigmoid  diverticulosis without signs of acute diverticulitis. 4. Aortic atherosclerotic calcification. Electronically signed by: Waddell Calk MD 04/13/2024 05:36 AM EDT RP Workstation: [id number]      LOS: 1 day   Ole Bourdon, NP [facility] Pager: [phone]  04/14/2024, 10:10 AM

## 2024-04-14 NOTE — Progress Notes (Signed)
 PROGRESS NOTE  Jeanette [name]  FMW:[id number] DOB: May 09, 1935 DOA: 04/13/2024 PCP: Shayne Anes, MD   Brief Narrative: Patient is a 89 year old female with history of hypertension, hyperlipidemia, paroxysmal A-fib on Eliquis , CVA, peripheral artery disease, renal artery stenosis,breast cancer/anal cancer, anxiety, depression who presented with left-sided flank pain.  She was seen in the emergency department with dysuria a day ago before admission and was found to have UTI, given a dose of ceftriaxone  and was discharged with ciprofloxacin  which she has not picked from pharmacy.  She developed nausea, vomiting at home and also left-sided flank pain which brought her to the emergency department again.  Has a history of recurrent UTI in the past.  On presentation, she was hypertensive, afebrile.  Lab work showed leukocytosis of 12.  CT abdomen/pelvis showed 5 mm stone in the left kidney posterior and lateral to lower pole calyx resulting in calyceal obstruction,hypoenhancement of the affected overlying renal cortex, and asymmetric wall thickening and enhancement involving the left renal pelvis up to the left UPJ.  Urology consulted.  Started on broad spectrum antibiotics.  Cultures sent  Assessment & Plan:  Principal Problem:   Pyelonephritis of left kidney Active Problems:   Sepsis (HCC)   Acute respiratory failure with hypoxia (HCC)   Nausea and vomiting   Normocytic anemia   Paroxysmal atrial fibrillation (HCC)   Anxiety and depression   History of CVA (cerebrovascular accident)   History of breast cancer in female   History of rectal cancer  Sepsis secondary to pyonephritis/nephrolithiasis: Presented with left flank pain, dysuria, nausea, vomiting.  Was tachycardic, tachypneic on Emergency Department.  Lab work showed leukocytosis.  UA suspicious for UTI.  CT abdomen/pelvis showed d 5 mm stone in the left kidney posterior and lateral to lower pole calyx resulting in calyceal  obstruction,hypoenhancement of the affected overlying renal cortex, and asymmetric wall thickening and enhancement involving the left renal pelvis up to the left UPJ.  Urology already consulted but did not recommend any intervention given outpatient follow-up.  Continue current antibiotics.  Follow-up urine cultures.  Continue pain management.  Acute hypoxic respiratory failure: Noticed to be hypoxic on presentation but denied any dyspnea.  Likely yesterday with narcotics.  Chest x-ray showed bibasilar linear atelectasis or scarring without any acute findings.  Currently on room air  Nausea/vomiting: Likely from the kidney stone, UTI.  Continue antiemetics.  Resolved  Paroxysmal A-fib: On Eliquis , propafenone .  On 10/7, her heart rate was sustaining  in the range of 120s.  EKG showed sinus tachycardia with PACs, given dose of metoprolol  2.5 IV.  Started on low-dose Toprol .  Monitor on telemetry  Normocytic anemia: currently hemoglobin stable.  Anxiety/depression: On Lexapro   History of CVA: No residual deficits.  History of rectal/breast cancer: Status postradiation treatment for rectal cancer, lumpectomy for breast cancer.  Currently in remission.  Deconditioning: Complains of some weakness.  Walks with a walker.  PT consulted.           DVT prophylaxis:apixaban  (ELIQUIS ) tablet 2.5 mg Start: 04/13/24 1000 apixaban  (ELIQUIS ) tablet 2.5 mg     Code Status: Full Code  Family Communication: None at bedside  Patient status:Inpatient  Patient is from :Home  Anticipated discharge un:Ynfz  Estimated DC date:1-2 days   Consultants: Urology  Procedures:None  Antimicrobials:  Anti-infectives (From admission, onward)    Start     Dose/Rate Route Frequency Ordered Stop   04/14/24 0500  cefTRIAXone  (ROCEPHIN ) 1 g in sodium chloride  0.9 % 100 mL IVPB  1 g 200 mL/hr over 30 Minutes Intravenous Every 24 hours 04/13/24 0828     04/13/24 0500  cefTRIAXone  (ROCEPHIN ) 1 g in  sodium chloride  0.9 % 100 mL IVPB        1 g 200 mL/hr over 30 Minutes Intravenous  Once 04/13/24 0451 04/13/24 0617       Subjective: Patient seen and examined the bedside today.  Hemodynamically stable.  Blood pressure stable.  No fever.  No nausea, vomiting or abdominal pain this morning.  Found to be in sinus tachycardia with EKG monitor showing heart rate in the range of 120s.  She is asymptomatic without any chest pain or dizziness.  Objective: Vitals:   04/13/24 0440 04/13/24 0500 04/13/24 0716 04/14/24 0749  BP: (!) 182/95 (!) 177/75 135/62 132/61  Pulse: (!) 101 90 88 85  Resp: 20 (!) 23 20 (!) 22  Temp:   98.9 F (37.2 C) 98.8 F (37.1 C)  TempSrc:   Oral Oral  SpO2: 100% 93% 100% 97%  Weight:      Height:        Intake/Output Summary (Last 24 hours) at 04/14/2024 0753 Last data filed at 04/14/2024 0200 Gross per 24 hour  Intake 120 ml  Output --  Net 120 ml   Filed Weights   04/13/24 0438  Weight: 57 kg    Examination:  General exam: Overall comfortable, not in distress HEENT: PERRL Respiratory system:  no wheezes or crackles  Cardiovascular system: Sinus tach Gastrointestinal system: Abdomen is nondistended, soft and nontender. Central nervous system: Alert and oriented Extremities: No edema, no clubbing ,no cyanosis Skin: No rashes, no ulcers,no icterus     Data Reviewed: I have personally reviewed following labs and imaging studies  CBC: Recent Labs  Lab 04/12/24 1057 04/13/24 0434 04/14/24 0231  WBC 7.4 12.0* 12.7*  NEUTROABS 4.8  --   --   HGB 10.7* 11.1* 10.0*  HCT 34.9* 35.3* 31.6*  MCV 98.0 98.1 96.0  PLT 199 204 175   Basic Metabolic Panel: Recent Labs  Lab 04/12/24 1057 04/13/24 0434 04/14/24 0231  NA 139 136 135  K 3.4* 4.1 3.6  CL 105 103 103  CO2 28 25 26   GLUCOSE 93 143* 109*  BUN 12 8 6*  CREATININE 0.71 0.64 0.78  CALCIUM  10.4* 10.0 9.8     Recent Results (from the past 240 hours)  Urine Culture     Status:  None (Preliminary result)   Collection Time: 04/12/24 10:11 AM   Specimen: Urine, Clean Catch  Result Value Ref Range Status   Specimen Description   Final    URINE, CLEAN CATCH Performed at [facility], [address], [place], [place] [address]    Special Requests   Final    NONE Performed at [facility], [address] [place], [place] [address]    Culture   Final    CULTURE REINCUBATED FOR BETTER GROWTH Performed at [facility], [address], [place], [place] [address]    Report Status PENDING  Incomplete     Radiology Studies: DG Abd 1 View Result Date: 04/14/2024 EXAM: 1 VIEW XRAY OF THE ABDOMEN 04/14/2024 05:39:52 AM COMPARISON: CT from 04/13/2024. CLINICAL HISTORY: Ureteral stone, Pyelonephritis of left kidney, rover. FINDINGS: BOWEL: Nonobstructive bowel gas pattern. SOFT TISSUES: The left kidney stone noted on the CT from prior day is not confidently identified. This may be obscured by overlying bowel gas. Pelvic phleboliths. Atherosclerotic calcifications.  BONES: Degenerative changes of the lumbar spine. No acute osseous abnormality. IMPRESSION: 1. No acute abdominal radiographic abnormality. 2. Previously noted left renal calculus on prior CT is not confidently identified on this radiograph, and may be obscured by overlying bowel gas. Electronically signed by: Waddell Calk MD 04/14/2024 07:19 AM EDT RP Workstation: [id number]   DG CHEST PORT 1 VIEW Result Date: 04/13/2024 CLINICAL DATA:  Hypoxia. EXAM: PORTABLE CHEST 1 VIEW COMPARISON:  03/15/2024 and CT chest 06/18/2007. FINDINGS: Trachea is midline. Heart size normal. Thoracic aorta is calcified. Bibasilar linear atelectasis or scarring. No airspace consolidation or pleural fluid. IMPRESSION: Bibasilar linear atelectasis or scarring.  No acute findings. Electronically Signed   By: Newell Eke M.D.   On: 04/13/2024 09:13   CT ABDOMEN PELVIS W CONTRAST Result Date:  04/13/2024 EXAM: CT ABDOMEN AND PELVIS WITH CONTRAST 04/13/2024 05:18:55 AM TECHNIQUE: CT of the abdomen and pelvis was performed with the administration of 75 mL of iohexol  (OMNIPAQUE ) 350 MG/ML injection. Multiplanar reformatted images are provided for review. Automated exposure control, iterative reconstruction, and/or weight-based adjustment of the mA/kV was utilized to reduce the radiation dose to as low as reasonably achievable. COMPARISON: 11/19/2022 CLINICAL HISTORY: Abdominal pain, acute, nonlocalized. left flank pain. Was seen at [facility] and was diagnosed with a UTI. FINDINGS: LOWER CHEST: Scarring identified within both lung bases. LIVER: Unchanged liver cysts. The largest is in the posterior right lobe measuring 1.4 cm, image 16/3. GALLBLADDER AND BILE DUCTS: Gallbladder is unremarkable. No biliary ductal dilatation. SPLEEN: No acute abnormality. PANCREAS: No acute abnormality. ADRENAL GLANDS: No acute abnormality. KIDNEYS, URETERS AND BLADDER: Right kidney: Upper pole cyst measures 1.6 cm, image 27/3. No follow-up imaging recommended. No stones in the right kidney or ureter. No right hydronephrosis. No right perinephric or periureteral stranding. Left kidney: Small stones identified within the lower pole collecting system. A 5 mm stone is identified within the posterior lateral lower pole calyceal infundibulum with associated dilatation of the posterior lower pole calyces. Peripheral wedge-shaped hypoenhancement is identified involving the affected lower pole renal cortex. Asymmetric wall thickening involving the left renal pelvis up to the left UPJ is noted. No left ureteral calculi identified. No left perinephric or periureteral stranding. Urinary bladder: Urinary bladder appears normal. GI AND BOWEL: Stomach demonstrates no acute abnormality. Sigmoid diverticulosis without signs of acute diverticulitis. There is no bowel obstruction. PERITONEUM AND RETROPERITONEUM: No ascites. No free  air. VASCULATURE: Aorta is normal in caliber. Aortic atherosclerosis. LYMPH NODES: No lymphadenopathy. REPRODUCTIVE ORGANS: Uterus and adnexal structures are unremarkable. BONES AND SOFT TISSUES: Lumbar degenerative disc disease. No acute osseous abnormality. No focal soft tissue abnormality. IMPRESSION: 1. 5 mm stone is identified within the left kidney posterior and lateral lower pole calyceal infundibulum resulting in calyceal obstruction and hypoenhancement of the affected overlying renal cortex. 2. Asymmetric wall thickening and enhancement involving the left renal pelvis up to the left UPJ this is favored to represent sequelae of urinary tract infection. Follow-up imaging is advised to ensure resolution and to exclude underlying urothelial neoplasm. 3. Sigmoid diverticulosis without signs of acute diverticulitis. 4. Aortic atherosclerotic calcification. Electronically signed by: Waddell Calk MD 04/13/2024 05:36 AM EDT RP Workstation: [id number]    Scheduled Meds:  apixaban   2.5 mg Oral BID   escitalopram   10 mg Oral Daily   propafenone   225 mg Oral BID   sodium chloride  flush  3 mL Intravenous Q12H   Continuous Infusions:  cefTRIAXone  (ROCEPHIN )  IV 1 g (04/14/24 0631)  LOS: 1 day   Ivonne Mustache, MD [facility] P10/01/2024, 7:53 AM

## 2024-04-14 NOTE — Progress Notes (Signed)
 0901: Patient up to the restroom assisted by [place], NT. Patient using home supplied depends.  0911: CCDM calls and reports that patient experienced 10 beat run of SVT, strip saved and printed, Dr. Jillian notified and to bedside sees telemetry strip At this time patient denies CP/SOB/ lightheadedness/Dizziness, N/V: states it's just my Afib  0936: CCDM reports that patient has experienced another run of SVT, doctore Adhikari notified  [phone]: Annette to bedside to complete EKG, with provider bedside  0940: NT reports EKG machine is malfunctioning, instructed to contact EKG teama and obtain STAT EKG 0945: EKG complete and mused   Morning assessment:  Patient endorses dysuria, generalized discomfort  Dependent edema to bilateral ankle, patient states this normal for her   Purse with cellphone bedside  1114: Husband bedside, updated on heart arrhythmia this morning and additional medication (Met)  1230: Tylenol  given. 650 ordered but patient states she only takes 500 twice daily. States would rather not take 650 at this time. Flu vaccination offered but patient declines at this time, states she does not want to take with acute illness.  1235: BP 129/63 (70)  1240: Telemetry battery replaced at this time  1530: Patient walks with physical therapist in the hallway  1600: Urine sample sent at this time. Patient is sitting in recliner talking on phone, denies any needs at this time  1630: IV dressing changed at this time: previous site and dressing was leaking and clotted  1800: Patient seen sitting in recliner in chair. Denies any needs at this time, table and call light both in reach.   Evening Assessment:  Unchanged

## 2024-04-14 NOTE — TOC CM/SW Note (Signed)
 Transition of Care [facility]) - Inpatient Brief Assessment   Patient Details  Name: Jeanette [name] MRN: [id number] Date of Birth: 06/30/1935  [facility] CM/SW Contact:    Tom-Johnson, Harvest Muskrat, RN Phone Number: 04/14/2024, 1:27 PM   Clinical Narrative:  Patient presented to the ED with worsening Lt Flank pain. Found to have Lt Kidney Pyelonephritis. Patient went to [facility] ED day prior to admission, diagnosed with UTI, treated and discharged home with prescription for Oral abx.  Patient has hx of recurrent UTI's, Breast Cancer, Renal Artery Stenosis,  HLD, A-Fib on Eliquis , CVA, PAD, Anal Cancer, Anxiety, depression and HLD. Urology consulted, no intervention at this time, patient to follow-up outpatient.   CM spoke with patient and husband, Jeanette [name] at bedside. Patient lives with Jeanette [name], has two supportive children. Modified independent, has a rollator and shower seat at home. Does not drive, Jeanette [name] transports patient to and from her appointments.  PCP is Jeanette Anes, MD and uses [facility] in [place].   No ICM needs or recommendations noted at this time.  Patient not Medically ready for discharge.  CM will continue to follow as patient progresses with care towards discharge.              Transition of Care Asessment: Insurance and Status: Insurance coverage has been reviewed Patient has primary care physician: Yes Home environment has been reviewed: Yes Prior level of function:: Modified Independent Prior/Current Home Services: No current home services Social Drivers of Health Review: SDOH reviewed no interventions necessary Readmission risk has been reviewed: Yes Transition of care needs: no transition of care needs at this time

## 2024-04-14 NOTE — Plan of Care (Signed)
 Please see shift progressive note about this [name].   Bowden Boody Charity fundraiser, Scientist, research (physical sciences)

## 2024-04-14 NOTE — Evaluation (Addendum)
 Physical Therapy Evaluation Patient Details Name: Jeanette [name] MRN: [id number] DOB: 29-Nov-1934 Today's Date: 04/14/2024  History of Present Illness  Pt is an 89 y/o F admitted on 04/13/24 after presenting with c/o L sided flank pain. Pt seen in the ED the day prior & found to have UTI. Pt is currently being treated for sepsis 2/2 pyonephritis/nephrolithiasis, acute hypoxic respiratory failure. PMH: HTN, HLD, paroxysmal a-fib on Eliquis , CVA, PAD, renal artery stenosis, breast CA, anal CA, anxiety, depression  Clinical Impression  Pt seen for PT evaluation with pt agreeable. Pt reports prior to admission she was living with her husband who still works, she's mod I with rollator. On this date, pt is able to complete bed mobility with mod I & ambulate in hallway with supervision fade to mod I. Pt without LOB, no c/o pain. Pt is at or close to baseline in regards to mobility & does not requires acute PT services at this time. PT to complete current orders, please re-consult if new needs arise.         If plan is discharge home, recommend the following: Assist for transportation   Can travel by private vehicle        Equipment Recommendations None recommended by PT  Recommendations for Other Services       Functional Status Assessment Patient has not had a recent decline in their functional status     Precautions / Restrictions Precautions Precautions: Fall Restrictions Weight Bearing Restrictions Per Provider Order: No      Mobility  Bed Mobility Overal bed mobility: Modified Independent             General bed mobility comments: supine>sit to exit L side of bed    Transfers Overall transfer level: Needs assistance Equipment used: Rolling walker (2 wheels) Transfers: Sit to/from Stand Sit to Stand: Supervision           General transfer comment: sit>stand from EOB    Ambulation/Gait Ambulation/Gait assistance: Supervision, Modified independent (Device/Increase  time) Gait Distance (Feet): 250 Feet Assistive device: Rolling walker (2 wheels) Gait Pattern/deviations: Step-through pattern, Decreased stride length Gait velocity: decreased        Stairs            Wheelchair Mobility     Tilt Bed    Modified Rankin (Stroke Patients Only)       Balance Overall balance assessment: Needs assistance Sitting-balance support: Feet supported Sitting balance-Leahy Scale: Good     Standing balance support: During functional activity, Bilateral upper extremity supported, Reliant on assistive device for balance Standing balance-Leahy Scale: Good                               Pertinent Vitals/Pain Pain Assessment Pain Assessment: No/denies pain    Home Living Family/patient expects to be discharged to:: Private residence Living Arrangements: Spouse/significant other Available Help at Discharge: Family;Available PRN/intermittently (spouse works during the day) Type of Home: House Home Access: Stairs to enter Entrance Stairs-Rails: None Secretary/administrator of Steps: 1/2 step in the garage   Home Layout: One level Home Equipment: Rollator (4 wheels);Other (comment);Shower seat - built in Additional Comments: bidet    Prior Function Prior Level of Function : Independent/Modified Independent             Mobility Comments: Ambulatory with rollator, 2 falls in the past year. ADLs Comments: Does not drive, spouse assists with drying off after showering & with  donning. Has hired Engineer, water, husband cooks. Wears depends x 3 years (2/2 hx of anal CA).     Extremity/Trunk Assessment   Upper Extremity Assessment Upper Extremity Assessment: Overall WFL for tasks assessed    Lower Extremity Assessment Lower Extremity Assessment: Overall WFL for tasks assessed       Communication   Communication Communication: No apparent difficulties    Cognition Arousal: Alert Behavior During Therapy: WFL for tasks  assessed/performed   PT - Cognitive impairments: No apparent impairments                         Following commands: Intact       Cueing Cueing Techniques: Verbal cues     General Comments      Exercises     Assessment/Plan    PT Assessment Patient does not need any further PT services  PT Problem List         PT Treatment Interventions      PT Goals (Current goals can be found in the Care Plan section)  Acute Rehab PT Goals Patient Stated Goal: get rid of stone PT Goal Formulation: All assessment and education complete, DC therapy Time For Goal Achievement: 04/28/24 Potential to Achieve Goals: Good    Frequency       Co-evaluation               AM-PAC PT 6 Clicks Mobility  Outcome Measure Help needed turning from your back to your side while in a flat bed without using bedrails?: None Help needed moving from lying on your back to sitting on the side of a flat bed without using bedrails?: None Help needed moving to and from a bed to a chair (including a wheelchair)?: None Help needed standing up from a chair using your arms (e.g., wheelchair or bedside chair)?: None Help needed to walk in hospital room?: None Help needed climbing 3-5 steps with a railing? : A Little 6 Click Score: 23    End of Session   Activity Tolerance: Patient tolerated treatment well Patient left: in chair;with chair alarm set;with call bell/phone within reach Nurse Communication: Mobility status      Time: 8496-8479 PT Time Calculation (min) (ACUTE ONLY): 17 min   Charges:   PT Evaluation $PT Eval Low Complexity: 1 Low   PT General Charges $$ ACUTE PT VISIT: 1 Visit         Richerd Pinal, PT, DPT 04/14/24, 3:41 PM   [name] 04/14/2024, 3:41 PM

## 2024-04-15 DIAGNOSIS — N12 Tubulo-interstitial nephritis, not specified as acute or chronic: Secondary | ICD-10-CM | POA: Diagnosis not present

## 2024-04-15 LAB — CBC
HCT: 30.1 % — ABNORMAL LOW (ref 36.0–46.0)
Hemoglobin: 9.7 g/dL — ABNORMAL LOW (ref 12.0–15.0)
MCH: 30.9 pg (ref 26.0–34.0)
MCHC: 32.2 g/dL (ref 30.0–36.0)
MCV: 95.9 fL (ref 80.0–100.0)
Platelets: 187 K/uL (ref 150–400)
RBC: 3.14 MIL/uL — ABNORMAL LOW (ref 3.87–5.11)
RDW: 13.8 % (ref 11.5–15.5)
WBC: 7.6 K/uL (ref 4.0–10.5)
nRBC: 0 % (ref 0.0–0.2)

## 2024-04-15 NOTE — Progress Notes (Signed)
 PROGRESS NOTE  Jeanette [name]  FMW:[id number] DOB: Apr 19, 1935 DOA: 04/13/2024 PCP: Shayne Anes, MD   Brief Narrative: Patient is a 89 year old female with history of hypertension, hyperlipidemia, paroxysmal A-fib on Eliquis , CVA, peripheral artery disease, renal artery stenosis,breast cancer/anal cancer, anxiety, depression who presented with left-sided flank pain.  She was seen in the emergency department with dysuria a day ago before admission and was found to have UTI, given a dose of ceftriaxone  and was discharged with ciprofloxacin  which she has not picked from pharmacy.  She developed nausea, vomiting at home and also left-sided flank pain which brought her to the emergency department again.  Has a history of recurrent UTI in the past.  On presentation, she was hypertensive, afebrile.  Lab work showed leukocytosis of 12.  CT abdomen/pelvis showed 5 mm stone in the left kidney posterior and lateral to lower pole calyx resulting in calyceal obstruction,hypoenhancement of the affected overlying renal cortex, and asymmetric wall thickening and enhancement involving the left renal pelvis up to the left UPJ.  Urology consulted.  Started on broad spectrum antibiotics.  Cultures sent.  Waiting on urine culture report before discharge.  Assessment & Plan:  Principal Problem:   Pyelonephritis of left kidney Active Problems:   Sepsis (HCC)   Acute respiratory failure with hypoxia (HCC)   Nausea and vomiting   Normocytic anemia   Paroxysmal atrial fibrillation (HCC)   Anxiety and depression   History of CVA (cerebrovascular accident)   History of breast cancer in female   History of rectal cancer  Sepsis secondary to pyonephritis/nephrolithiasis: Presented with left flank pain, dysuria, nausea, vomiting.  Was tachycardic, tachypneic on Emergency Department.  Lab work showed leukocytosis.  UA suspicious for UTI.  CT abdomen/pelvis showed d 5 mm stone in the left kidney posterior and lateral to lower  pole calyx resulting in calyceal obstruction,hypoenhancement of the affected overlying renal cortex, and asymmetric wall thickening and enhancement involving the left renal pelvis up to the left UPJ.  Urology already consulted but did not recommend any intervention, recommended outpatient follow-up.  Continue current antibiotics.  Follow-up urine cultures.  Leukocytosis resolved  Acute hypoxic respiratory failure: Noticed to be hypoxic on presentation but denied any dyspnea.  Likely yesterday with narcotics.  Chest x-ray showed bibasilar linear atelectasis or scarring without any acute findings.  Currently on room air  Nausea/vomiting: Likely from the kidney stone, UTI.  Continue antiemetics.  Resolved  Paroxysmal A-fib: On Eliquis , propafenone .  On 10/7, her heart rate was sustaining  in the range of 120s.  EKG showed sinus tachycardia with PACs, given dose of metoprolol  2.5 IV.  Started on low-dose Toprol .  Monitor on telemetry.  Remains in normal sinus rhythm this morning.  Normocytic anemia: currently hemoglobin stable.  Anxiety/depression: On Lexapro   History of CVA: No residual deficits.  History of rectal/breast cancer: Status postradiation treatment for rectal cancer, lumpectomy for breast cancer.  Currently in remission.  Deconditioning: Did not recommend follow-up           DVT prophylaxis:apixaban  (ELIQUIS ) tablet 2.5 mg Start: 04/13/24 1000 apixaban  (ELIQUIS ) tablet 2.5 mg     Code Status: Full Code  Family Communication: Called and discussed with her husband Charlie on phone on 10/8  Patient status:Inpatient  Patient is from :Home  Anticipated discharge un:Ynfz  Estimated DC date:tomorrow   Consultants: Urology  Procedures:None  Antimicrobials:  Anti-infectives (From admission, onward)    Start     Dose/Rate Route Frequency Ordered Stop   04/14/24 0500  cefTRIAXone  (ROCEPHIN ) 1 g in sodium chloride  0.9 % 100 mL IVPB        1 g 200 mL/hr over 30 Minutes  Intravenous Every 24 hours 04/13/24 0828     04/13/24 0500  cefTRIAXone  (ROCEPHIN ) 1 g in sodium chloride  0.9 % 100 mL IVPB        1 g 200 mL/hr over 30 Minutes Intravenous  Once 04/13/24 0451 04/13/24 0617       Subjective: Patient seen and examined at bedside today.  Hemodynamically stable.  Overall comfortable.  Lying in bed.  No nausea, vomiting or abdomen pain today.  No fever.  Eager to go home.  We discussed about the need of getting report of urine culture before discharge .  I called and discussed with her husband discharged on phone from bedside  Objective: Vitals:   04/14/24 1614 04/14/24 2016 04/15/24 0428 04/15/24 0807  BP: 126/69 (!) 158/72 (S) (!) 175/78 (!) 148/82  Pulse: (!) 107 75 84 90  Resp: 14 20 17  (!) 21  Temp: 98.5 F (36.9 C) 97.6 F (36.4 C) 98.2 F (36.8 C) 98 F (36.7 C)  TempSrc: Oral Oral Oral Oral  SpO2: 97% 98% 99% 99%  Weight:      Height:        Intake/Output Summary (Last 24 hours) at 04/15/2024 1044 Last data filed at 04/15/2024 0808 Gross per 24 hour  Intake 320 ml  Output 200 ml  Net 120 ml   Filed Weights   04/13/24 0438  Weight: 57 kg    Examination:  General exam: Overall comfortable, not in distress, pleasant elderly female HEENT: PERRL Respiratory system:  no wheezes or crackles  Cardiovascular system: S1 & S2 heard, RRR.  Gastrointestinal system: Abdomen is nondistended, soft and nontender. Central nervous system: Alert and oriented Extremities: No edema, no clubbing ,no cyanosis Skin: No rashes, no ulcers,no icterus     Data Reviewed: I have personally reviewed following labs and imaging studies  CBC: Recent Labs  Lab 04/12/24 1057 04/13/24 0434 04/14/24 0231 04/15/24 0210  WBC 7.4 12.0* 12.7* 7.6  NEUTROABS 4.8  --   --   --   HGB 10.7* 11.1* 10.0* 9.7*  HCT 34.9* 35.3* 31.6* 30.1*  MCV 98.0 98.1 96.0 95.9  PLT 199 204 175 187   Basic Metabolic Panel: Recent Labs  Lab 04/12/24 1057 04/13/24 0434  04/14/24 0231  NA 139 136 135  K 3.4* 4.1 3.6  CL 105 103 103  CO2 28 25 26   GLUCOSE 93 143* 109*  BUN 12 8 6*  CREATININE 0.71 0.64 0.78  CALCIUM  10.4* 10.0 9.8     Recent Results (from the past 240 hours)  Urine Culture     Status: Abnormal (Preliminary result)   Collection Time: 04/12/24 10:11 AM   Specimen: Urine, Clean Catch  Result Value Ref Range Status   Specimen Description   Final    URINE, CLEAN CATCH Performed at [facility], [address], [place], [place] [address]    Special Requests   Final    NONE Performed at [facility], [address] [place], [place] [address]    Culture (A)  Final    10,000 COLONIES/mL ENTEROCOCCUS FAECALIS SUSCEPTIBILITIES TO FOLLOW Performed at [facility], [address] [place], [place] [address]    Report Status PENDING  Incomplete     Radiology Studies: DG Abd 1 View Result Date: 04/14/2024 EXAM: 1 VIEW XRAY OF  THE ABDOMEN 04/14/2024 05:39:52 AM COMPARISON: CT from 04/13/2024. CLINICAL HISTORY: Ureteral stone, Pyelonephritis of left kidney, rover. FINDINGS: BOWEL: Nonobstructive bowel gas pattern. SOFT TISSUES: The left kidney stone noted on the CT from prior day is not confidently identified. This may be obscured by overlying bowel gas. Pelvic phleboliths. Atherosclerotic calcifications. BONES: Degenerative changes of the lumbar spine. No acute osseous abnormality. IMPRESSION: 1. No acute abdominal radiographic abnormality. 2. Previously noted left renal calculus on prior CT is not confidently identified on this radiograph, and may be obscured by overlying bowel gas. Electronically signed by: Waddell Calk MD 04/14/2024 07:19 AM EDT RP Workstation: [id number]    Scheduled Meds:  apixaban   2.5 mg Oral BID   escitalopram   10 mg Oral Daily   metoprolol  succinate  25 mg Oral Daily   propafenone   225 mg Oral BID   sodium chloride  flush  3 mL Intravenous Q12H   Continuous  Infusions:  cefTRIAXone  (ROCEPHIN )  IV Stopped (04/15/24 0626)     LOS: 2 days   Ivonne Mustache, MD [facility] P10/02/2024, 10:44 AM

## 2024-04-15 NOTE — Progress Notes (Signed)
 Mobility Specialist Progress Note:    04/15/24 1200  Mobility  Activity Ambulated with assistance  Level of Assistance Contact guard assist, steadying assist  Assistive Device Front wheel walker  Distance Ambulated (ft) 300 ft  Activity Response Tolerated well  Mobility Referral Yes  Mobility visit 1 Mobility  Mobility Specialist Start Time (ACUTE ONLY) 1020  Mobility Specialist Stop Time (ACUTE ONLY) 1035  Mobility Specialist Time Calculation (min) (ACUTE ONLY) 15 min   Pt received in bed agreeable to mobility. No physical assistance required, contact guard for safety. Assisted w/ gown change. No c/o throughout. Returned to room w/o fault. Left in chair w/ call bell and personal belongings in reach. All needs met.   Thersia Minder Mobility Specialist  Please contact vis Secure Chat or  Rehab Office [phone]

## 2024-04-15 NOTE — Progress Notes (Signed)
 0730: This nurse assumes care for this patient at this time. Report received from Charito: states no major changes with patient. States patient had questions about her nephrology plan of care. Patient was educated on information to be given during morning rounds.  0800: Dr. Jillian contacted about telemetry order, reviewing patient   Morning Round: Patient seen resting in bed. Patient states that she feels a bit confused this morning. States she mix up her days and nights but is still able to hold a conversation. Patient is asking about her nephrology plan of care. This nurse offers to call husband for patient as she is wondering when he will arrive bedside, patient states she will contact him from cell. Dr. Jillian to bedside, states patient will follow up with nephrology outpatient. Dr. Jillian asking about urine culture sent yesterday, micro to be contacted.  1330: Husband to nurses station requesting to speak to this nurse. Is concerned about patient's orientation at this time. States patient is confusing day versus night and believes she is in her home and people are running on the porch this nurse reassures husband that this information will be relayed to provider.  1347: Dr. Jillian notified of changes to patient's mentation. No answer or new orders placed. This nurse to bedside to find patient in chair in fetal position. Offered patient to get in bed, patient refuses stated that she is warm where she is. Chair alarm on and this nurse resides outside of patient room.  1435: Chair Alarm is sounding as patient is up and to the restroom. Physical therapy bedside with patient assuring patient is safe.  1500: This nurse to patient's bedside to find her back in the chair, per PT she refused to go to the bed. Chair alarm active. This RN questions patient about her recent confusion, states she does feel off and confused. Only alert to self at this time. This nurse assists patient to bed, and supplied  with warm blankets. Bed alarm on 1700 Rounding: This nurse to bedside for physical assessment, patient seen sleeping. Will allow and defer at this time. On basic visual assessment, patient's breathing is normal and unlabored, skin is intact, non pitting edema to bilateral lower legs, patient can ambulate independently but this nurse does not recommend, until patient is reoriented properly.

## 2024-04-16 ENCOUNTER — Other Ambulatory Visit (HOSPITAL_COMMUNITY): Payer: Self-pay

## 2024-04-16 DIAGNOSIS — N12 Tubulo-interstitial nephritis, not specified as acute or chronic: Secondary | ICD-10-CM | POA: Diagnosis not present

## 2024-04-16 LAB — URINE CULTURE: Culture: <10000 — AB

## 2024-04-16 MED ORDER — AMOXICILLIN-POT CLAVULANATE 875-125 MG PO TABS
1.0000 | ORAL_TABLET | Freq: Two times a day (BID) | ORAL | 0 refills | Status: DC
  Filled 2024-04-16: qty 2, 1d supply, fill #0

## 2024-04-16 MED ORDER — AMOXICILLIN-POT CLAVULANATE 875-125 MG PO TABS
1.0000 | ORAL_TABLET | Freq: Two times a day (BID) | ORAL | 0 refills | Status: AC
  Filled 2024-04-16: qty 12, 6d supply, fill #0

## 2024-04-16 MED ORDER — METOPROLOL SUCCINATE ER 25 MG PO TB24
25.0000 mg | ORAL_TABLET | Freq: Every day | ORAL | 0 refills | Status: AC
  Filled 2024-04-16: qty 30, 30d supply, fill #0

## 2024-04-16 NOTE — Plan of Care (Signed)
   Problem: Clinical Measurements: Goal: Respiratory complications will improve Outcome: Progressing   Problem: Activity: Goal: Risk for activity intolerance will decrease Outcome: Progressing

## 2024-04-16 NOTE — Progress Notes (Signed)
 Mobility Specialist Progress Note:   04/16/24 0930  Mobility  Activity Ambulated with assistance  Level of Assistance Contact guard assist, steadying assist  Assistive Device Other (Comment) (IV pole)  Distance Ambulated (ft) 30 ft  Activity Response Tolerated well  Mobility Referral Yes  Mobility visit 1 Mobility  Mobility Specialist Start Time (ACUTE ONLY) 0930  Mobility Specialist Stop Time (ACUTE ONLY) 0940  Mobility Specialist Time Calculation (min) (ACUTE ONLY) 10 min   Pt agreeable to mobility session. Requested to use BR. Ambulated to/from BR with IV pole and minG assist. No unsteadiness noted. BM successful. Ambulated around room to chair. Left with all needs met, alarm on.   Therisa Rana Mobility Specialist Please contact via SecureChat or  Rehab office at [phone]

## 2024-04-16 NOTE — Discharge Summary (Signed)
 Physician Discharge Summary  Jeanette [name] FMW:[id number] DOB: February 27, 1935 DOA: 04/13/2024  PCP: Shayne Anes, MD  Admit date: 04/13/2024 Discharge date: 04/16/2024  Admitted From: Home Disposition:  Home  Discharge Condition:Stable CODE STATUS:FULL Diet recommendation: Heart Healthy   Brief/Interim Summary: Patient is a 89 year old female with history of hypertension, hyperlipidemia, paroxysmal A-fib on Eliquis , CVA, peripheral artery disease, renal artery stenosis,breast cancer/anal cancer, anxiety, depression who presented with left-sided flank pain. She was seen in the emergency department with dysuria a day ago before admission and was found to have UTI, given a dose of ceftriaxone  and was discharged with ciprofloxacin  which she has not picked from pharmacy. She developed nausea, vomiting at home and also left-sided flank pain which brought her to the emergency department again. Has a history of recurrent UTI in the past. On presentation, she was hypertensive, afebrile. Lab work showed leukocytosis of 12. CT abdomen/pelvis showed 5 mm stone in the left kidney posterior and lateral to lower pole calyx resulting in calyceal obstruction,hypoenhancement of the affected overlying renal cortex, and asymmetric wall thickening and enhancement involving the left renal pelvis up to the left UPJ. Urology consulted. Started on broad spectrum antibiotics.  Urine culture showed insignificant growth.  Sepsis physiology has resolved.  She will be discharged with Augmentin which should be continued for 6 more days to complete 10 days course of treatment.  She will follow-up with urology as an outpatient  Following problems were addressed during the hospitalization:  Sepsis secondary to pyonephritis/nephrolithiasis: Presented with left flank pain, dysuria, nausea, vomiting.  Was tachycardic, tachypneic on Emergency Department.  Lab work showed leukocytosis.  UA suspicious for UTI.  CT abdomen/pelvis showed d 5 mm  stone in the left kidney posterior and lateral to lower pole calyx resulting in calyceal obstruction,hypoenhancement of the affected overlying renal cortex, and asymmetric wall thickening and enhancement involving the left renal pelvis up to the left UPJ.  Urology already consulted but did not recommend any intervention, recommended outpatient follow-up. Leukocytosis resolved.Sepsis physiology has resolved.  She will be discharged with Augmentin which should be continued for 6 more days to complete 10 days course of treatment.  She will follow-up with urology as an outpatient   Acute hypoxic respiratory failure: Noticed to be hypoxic on presentation but denied any dyspnea.  Likely yesterday with narcotics.  Chest x-ray showed bibasilar linear atelectasis or scarring without any acute findings.  Currently on room air   Nausea/vomiting: Likely from the kidney stone, UTI.  Resolved   Paroxysmal A-fib: On Eliquis , propafenone .  On 10/7, her heart rate was sustaining  in the range of 120s.  EKG showed sinus tachycardia with PACs, given dose of metoprolol  2.5 IV.  Started on low-dose Toprol .  Remains in normal sinus rhythm this morning.   Normocytic anemia: currently hemoglobin stable.   Anxiety/depression: On Lexapro    History of CVA: No residual deficits.   History of rectal/breast cancer: Status postradiation treatment for rectal cancer, lumpectomy for breast cancer.  Currently in remission.   Deconditioning: PT did not recommend follow-up        Discharge Diagnoses:  Principal Problem:   Pyelonephritis of left kidney Active Problems:   Sepsis (HCC)   Acute respiratory failure with hypoxia (HCC)   Nausea and vomiting   Normocytic anemia   Paroxysmal atrial fibrillation (HCC)   Anxiety and depression   History of CVA (cerebrovascular accident)   History of breast cancer in female   History of rectal cancer    Discharge Instructions  Discharge Instructions     Diet - low sodium  heart healthy   Complete by: As directed    Discharge instructions   Complete by: As directed    1)Please take your medications as instructed 2)Follow up with your PCP in a week   Increase activity slowly   Complete by: As directed       Allergies as of 04/16/2024       Reactions   Epinephrine  Anaphylaxis   Shortness of breath and increased heart rate   Benazepril Hcl Cough   Cozaar [losartan] Other (See Comments)   increased air hunger and dizziness.   Crestor [rosuvastatin Calcium ] Other (See Comments)   cognitive   Galantamine Hydrobromide Other (See Comments)   wt loss and dizziness   Iron Diarrhea   Lisinopril Cough   Septra [sulfamethoxazole-trimethoprim] Other (See Comments)   2017 had a strange reaction she says.        Medication List     STOP taking these medications    ciprofloxacin  500 MG tablet Commonly known as: CIPRO    fosfomycin 3 g Pack Commonly known as: MONUROL        TAKE these medications    acetaminophen  500 MG tablet Commonly known as: TYLENOL  Take 500 mg by mouth in the morning and at bedtime.   amoxicillin-clavulanate 875-125 MG tablet Commonly known as: AUGMENTIN Take 1 tablet by mouth 2 (two) times daily for 6 days. Start taking on: April 17, 2024   apixaban  2.5 MG Tabs tablet Commonly known as: ELIQUIS  Take 1 tablet (2.5 mg total) by mouth 2 (two) times daily.   cholecalciferol  25 MCG (1000 UNIT) tablet Commonly known as: VITAMIN D3 Take 1,000 Units by mouth daily.   escitalopram  10 MG tablet Commonly known as: LEXAPRO  Take 10 mg by mouth daily.   metoprolol  succinate 25 MG 24 hr tablet Commonly known as: TOPROL -XL Take 1 tablet (25 mg total) by mouth daily. Start taking on: April 17, 2024   nitroGLYCERIN  0.4 MG SL tablet Commonly known as: NITROSTAT  PLACE 1 TABLET (0.4 MG TOTAL) UNDER THE TONGUE EVERY 5 (FIVE) MINUTES AS NEEDED FOR CHEST PAIN.   propafenone  225 MG 12 hr capsule Commonly known as: RYTHMOL   SR Take 1 capsule (225 mg total) by mouth 2 (two) times daily.   Repatha SureClick 140 MG/ML Soaj Generic drug: Evolocumab Inject 140 mg into the skin See admin instructions. Take 140mg  SQ every two weeks per patient   Vitamin B12 1000 MCG Tbcr Take 1,000 mcg by mouth in the morning.        Follow-up Information     Shayne Anes, MD. Schedule an appointment as soon as possible for a visit in 1 week(s).   Specialty: Internal Medicine Contact information: [address] [place] [address] [phone]                Allergies  Allergen Reactions   Epinephrine  Anaphylaxis    Shortness of breath and increased heart rate   Benazepril Hcl Cough   Cozaar [Losartan] Other (See Comments)    increased air hunger and dizziness.   Crestor [Rosuvastatin Calcium ] Other (See Comments)    cognitive   Galantamine Hydrobromide Other (See Comments)    wt loss and dizziness   Iron Diarrhea   Lisinopril Cough   Septra [Sulfamethoxazole-Trimethoprim] Other (See Comments)    2017 had a strange reaction she says.    Consultations: Uroloogy   Procedures/Studies: DG Abd 1 View Result Date: 04/14/2024 EXAM: 1 VIEW  XRAY OF THE ABDOMEN 04/14/2024 05:39:52 AM COMPARISON: CT from 04/13/2024. CLINICAL HISTORY: Ureteral stone, Pyelonephritis of left kidney, rover. FINDINGS: BOWEL: Nonobstructive bowel gas pattern. SOFT TISSUES: The left kidney stone noted on the CT from prior day is not confidently identified. This may be obscured by overlying bowel gas. Pelvic phleboliths. Atherosclerotic calcifications. BONES: Degenerative changes of the lumbar spine. No acute osseous abnormality. IMPRESSION: 1. No acute abdominal radiographic abnormality. 2. Previously noted left renal calculus on prior CT is not confidently identified on this radiograph, and may be obscured by overlying bowel gas. Electronically signed by: Waddell Calk MD 04/14/2024 07:19 AM EDT RP Workstation: [id number]   DG CHEST  PORT 1 VIEW Result Date: 04/13/2024 CLINICAL DATA:  Hypoxia. EXAM: PORTABLE CHEST 1 VIEW COMPARISON:  03/15/2024 and CT chest 06/18/2007. FINDINGS: Trachea is midline. Heart size normal. Thoracic aorta is calcified. Bibasilar linear atelectasis or scarring. No airspace consolidation or pleural fluid. IMPRESSION: Bibasilar linear atelectasis or scarring.  No acute findings. Electronically Signed   By: Newell Eke M.D.   On: 04/13/2024 09:13   CT ABDOMEN PELVIS W CONTRAST Result Date: 04/13/2024 EXAM: CT ABDOMEN AND PELVIS WITH CONTRAST 04/13/2024 05:18:55 AM TECHNIQUE: CT of the abdomen and pelvis was performed with the administration of 75 mL of iohexol  (OMNIPAQUE ) 350 MG/ML injection. Multiplanar reformatted images are provided for review. Automated exposure control, iterative reconstruction, and/or weight-based adjustment of the mA/kV was utilized to reduce the radiation dose to as low as reasonably achievable. COMPARISON: 11/19/2022 CLINICAL HISTORY: Abdominal pain, acute, nonlocalized. left flank pain. Was seen at [facility] and was diagnosed with a UTI. FINDINGS: LOWER CHEST: Scarring identified within both lung bases. LIVER: Unchanged liver cysts. The largest is in the posterior right lobe measuring 1.4 cm, image 16/3. GALLBLADDER AND BILE DUCTS: Gallbladder is unremarkable. No biliary ductal dilatation. SPLEEN: No acute abnormality. PANCREAS: No acute abnormality. ADRENAL GLANDS: No acute abnormality. KIDNEYS, URETERS AND BLADDER: Right kidney: Upper pole cyst measures 1.6 cm, image 27/3. No follow-up imaging recommended. No stones in the right kidney or ureter. No right hydronephrosis. No right perinephric or periureteral stranding. Left kidney: Small stones identified within the lower pole collecting system. A 5 mm stone is identified within the posterior lateral lower pole calyceal infundibulum with associated dilatation of the posterior lower pole calyces. Peripheral wedge-shaped  hypoenhancement is identified involving the affected lower pole renal cortex. Asymmetric wall thickening involving the left renal pelvis up to the left UPJ is noted. No left ureteral calculi identified. No left perinephric or periureteral stranding. Urinary bladder: Urinary bladder appears normal. GI AND BOWEL: Stomach demonstrates no acute abnormality. Sigmoid diverticulosis without signs of acute diverticulitis. There is no bowel obstruction. PERITONEUM AND RETROPERITONEUM: No ascites. No free air. VASCULATURE: Aorta is normal in caliber. Aortic atherosclerosis. LYMPH NODES: No lymphadenopathy. REPRODUCTIVE ORGANS: Uterus and adnexal structures are unremarkable. BONES AND SOFT TISSUES: Lumbar degenerative disc disease. No acute osseous abnormality. No focal soft tissue abnormality. IMPRESSION: 1. 5 mm stone is identified within the left kidney posterior and lateral lower pole calyceal infundibulum resulting in calyceal obstruction and hypoenhancement of the affected overlying renal cortex. 2. Asymmetric wall thickening and enhancement involving the left renal pelvis up to the left UPJ this is favored to represent sequelae of urinary tract infection. Follow-up imaging is advised to ensure resolution and to exclude underlying urothelial neoplasm. 3. Sigmoid diverticulosis without signs of acute diverticulitis. 4. Aortic atherosclerotic calcification. Electronically signed by: Waddell Calk MD 04/13/2024 05:36 AM EDT RP Workstation:  [id number]      Subjective: Patient seen and examined at bedside today.  Hemodynamically stable.  Overall comfortable .no new complaints.  Medically stable for discharge to home today  Discharge Exam: Vitals:   04/16/24 0515 04/16/24 0938  BP: (!) 142/58 (!) 153/74  Pulse: (!) 51 79  Resp: 17 19  Temp: 98.5 F (36.9 C) 98.2 F (36.8 C)  SpO2: 98% 97%   Vitals:   04/15/24 1728 04/15/24 2150 04/16/24 0515 04/16/24 0938  BP: 128/60 (!) 142/76 (!) 142/58 (!) 153/74   Pulse: 80 80 (!) 51 79  Resp: 17 18 17 19   Temp: 97.9 F (36.6 C) 98.2 F (36.8 C) 98.5 F (36.9 C) 98.2 F (36.8 C)  TempSrc: Oral     SpO2: 98% 97% 98% 97%  Weight:      Height:        General: Pt is alert, awake, not in acute distress Cardiovascular: RRR, S1/S2 +, no rubs, no gallops Respiratory: CTA bilaterally, no wheezing, no rhonchi Abdominal: Soft, NT, ND, bowel sounds + Extremities: no edema, no cyanosis    The results of significant diagnostics from this hospitalization (including imaging, microbiology, ancillary and laboratory) are listed below for reference.     Microbiology: Recent Results (from the past 240 hours)  Urine Culture     Status: Abnormal (Preliminary result)   Collection Time: 04/12/24 10:11 AM   Specimen: Urine, Clean Catch  Result Value Ref Range Status   Specimen Description   Final    URINE, CLEAN CATCH Performed at [facility], [address] [place], [place] [address]    Special Requests   Final    NONE Performed at [facility], [address] [place], [place] [address]    Culture (A)  Final    10,000 COLONIES/mL ENTEROCOCCUS FAECALIS SUSCEPTIBILITIES TO FOLLOW >=100,000 COLONIES/mL LACTOBACILLUS SPECIES Standardized susceptibility testing for this organism is not available. Performed at [facility], [address] [place], [place] [address]    Report Status PENDING  Incomplete  Urine Culture     Status: Abnormal   Collection Time: 04/13/24  5:43 AM   Specimen: Urine, Clean Catch  Result Value Ref Range Status   Specimen Description URINE, CLEAN CATCH  Final   Special Requests NONE  Final   Culture (A)  Final    <10,000 COLONIES/mL INSIGNIFICANT GROWTH Performed at [facility], [address], [place], [place] [address]    Report Status 04/16/2024 FINAL  Final     Labs: BNP (last 3 results) Recent Labs    10/12/23 1157  BNP 264.2*   Basic Metabolic  Panel: Recent Labs  Lab 04/12/24 1057 04/13/24 0434 04/14/24 0231  NA 139 136 135  K 3.4* 4.1 3.6  CL 105 103 103  CO2 28 25 26   GLUCOSE 93 143* 109*  BUN 12 8 6*  CREATININE 0.71 0.64 0.78  CALCIUM  10.4* 10.0 9.8   Liver Function Tests: No results for input(s): AST, ALT, ALKPHOS, BILITOT, PROT, ALBUMIN in the last 168 hours. No results for input(s): LIPASE, AMYLASE in the last 168 hours. No results for input(s): AMMONIA in the last 168 hours. CBC: Recent Labs  Lab 04/12/24 1057 04/13/24 0434 04/14/24 0231 04/15/24 0210  WBC 7.4 12.0* 12.7* 7.6  NEUTROABS 4.8  --   --   --   HGB 10.7* 11.1* 10.0* 9.7*  HCT 34.9* 35.3* 31.6* 30.1*  MCV 98.0 98.1 96.0 95.9  PLT  199 204 175 187   Cardiac Enzymes: No results for input(s): CKTOTAL, CKMB, CKMBINDEX, TROPONINI in the last 168 hours. BNP: Invalid input(s): POCBNP CBG: No results for input(s): GLUCAP in the last 168 hours. D-Dimer No results for input(s): DDIMER in the last 72 hours. Hgb A1c No results for input(s): HGBA1C in the last 72 hours. Lipid Profile No results for input(s): CHOL, HDL, LDLCALC, TRIG, CHOLHDL, LDLDIRECT in the last 72 hours. Thyroid  function studies No results for input(s): TSH, T4TOTAL, T3FREE, THYROIDAB in the last 72 hours.  Invalid input(s): FREET3 Anemia work up No results for input(s): VITAMINB12, FOLATE, FERRITIN, TIBC, IRON, RETICCTPCT in the last 72 hours. Urinalysis    Component Value Date/Time   COLORURINE YELLOW 04/13/2024 1650   APPEARANCEUR CLEAR 04/13/2024 1650   LABSPEC 1.009 04/13/2024 1650   PHURINE 6.0 04/13/2024 1650   GLUCOSEU NEGATIVE 04/13/2024 1650   HGBUR MODERATE (A) 04/13/2024 1650   BILIRUBINUR NEGATIVE 04/13/2024 1650   KETONESUR NEGATIVE 04/13/2024 1650   PROTEINUR NEGATIVE 04/13/2024 1650   UROBILINOGEN 0.2 03/14/2009 1400   NITRITE NEGATIVE 04/13/2024 1650   LEUKOCYTESUR LARGE (A)  04/13/2024 1650   Sepsis Labs Recent Labs  Lab 04/12/24 1057 04/13/24 0434 04/14/24 0231 04/15/24 0210  WBC 7.4 12.0* 12.7* 7.6   Microbiology Recent Results (from the past 240 hours)  Urine Culture     Status: Abnormal (Preliminary result)   Collection Time: 04/12/24 10:11 AM   Specimen: Urine, Clean Catch  Result Value Ref Range Status   Specimen Description   Final    URINE, CLEAN CATCH Performed at [facility], [address] [place], [place] [address]    Special Requests   Final    NONE Performed at [facility], [address] [place], [place] [address]    Culture (A)  Final    10,000 COLONIES/mL ENTEROCOCCUS FAECALIS SUSCEPTIBILITIES TO FOLLOW >=100,000 COLONIES/mL LACTOBACILLUS SPECIES Standardized susceptibility testing for this organism is not available. Performed at [facility], [address], [place], [place] [address]    Report Status PENDING  Incomplete  Urine Culture     Status: Abnormal   Collection Time: 04/13/24  5:43 AM   Specimen: Urine, Clean Catch  Result Value Ref Range Status   Specimen Description URINE, CLEAN CATCH  Final   Special Requests NONE  Final   Culture (A)  Final    <10,000 COLONIES/mL INSIGNIFICANT GROWTH Performed at [facility], [address] [place], [place] [address]    Report Status 04/16/2024 FINAL  Final    Please note: You were cared for by a hospitalist during your hospital stay. Once you are discharged, your primary care physician will handle any further medical issues. Please note that NO REFILLS for any discharge medications will be authorized once you are discharged, as it is imperative that you return to your primary care physician (or establish a relationship with a primary care physician if you do not have one) for your post hospital discharge needs so that they can reassess your need for medications and monitor your lab values.    Time coordinating  discharge: 40 minutes  SIGNED:   Ivonne Mustache, MD  [facility] 04/16/2024, 11:09 AM Pager [phone]  If 7PM-7AM, please contact night-coverage [email or web address] Password [email or web address]

## 2024-04-16 NOTE — TOC Transition Note (Signed)
 Transition of Care [facility]) - Discharge Note   Patient Details  Name: Jeanette [name] MRN: [id number] Date of Birth: 1934/09/08  Transition of Care [facility]) CM/SW Contact:  Tom-Johnson, Harvest Muskrat, RN Phone Number: 04/16/2024, 11:23 AM   Clinical Narrative:     Patient is scheduled for discharge today.  Readmission Risk Assessment done. Outpatient f/u, hospital f/u and discharge instructions on AVS. Prescriptions sent to [facility] pharmacy and patient will receive meds prior discharge. No PT f/u, ICM needs or recommendations noted. Husband, Richard to transport at discharge.  No further ICM needs noted.       Final next level of care: Home/Self Care Barriers to Discharge: Barriers Resolved   Patient Goals and CMS Choice Patient states their goals for this hospitalization and ongoing recovery are:: To return home CMS Medicare.gov Compare Post Acute Care list provided to:: Patient Choice offered to / list presented to : NA      Discharge Placement                Patient to be transferred to facility by: Husband Name of family member notified: Richard    Discharge Plan and Services Additional resources added to the After Visit Summary for                  DME Arranged: N/A DME Agency: NA       HH Arranged: NA HH Agency: NA        Social Drivers of Health (SDOH) Interventions SDOH Screenings   Food Insecurity: No Food Insecurity (04/13/2024)  Housing: Low Risk  (04/13/2024)  Transportation Needs: No Transportation Needs (04/13/2024)  Utilities: Not At Risk (04/13/2024)  Social Connections: Socially Integrated (04/13/2024)  Tobacco Use: Low Risk  (04/13/2024)     Readmission Risk Interventions    04/16/2024   11:22 AM 04/14/2024    1:26 PM  Readmission Risk Prevention Plan  Transportation Screening Complete Complete  PCP or Specialist Appt within 5-7 Days Complete   PCP or Specialist Appt within 3-5 Days  Complete  Home Care Screening Complete    Medication Review (RN CM) Referral to Pharmacy   HRI or Home Care Consult  Complete  Social Work Consult for Recovery Care Planning/Counseling  Complete  Palliative Care Screening  Not Applicable  Medication Review Oceanographer)  Referral to Pharmacy

## 2024-04-16 NOTE — Care Management Important Message (Signed)
 Important Message  Patient Details  Name: Jeanette [name] MRN: [id number] Date of Birth: 12/08/1934   Important Message Given:  Yes - Medicare IM     Claretta Deed 04/16/2024, 2:06 PM

## 2024-04-17 LAB — URINE CULTURE: Culture: 10000 — AB

## 2024-04-18 ENCOUNTER — Telehealth (HOSPITAL_BASED_OUTPATIENT_CLINIC_OR_DEPARTMENT_OTHER): Payer: Self-pay | Admitting: *Deleted

## 2024-04-18 NOTE — Telephone Encounter (Signed)
 Post ED Visit - Positive Culture Follow-up  Culture report reviewed by antimicrobial stewardship pharmacist: [name] []  Rankin Dee, Pharm.D. []  Venetia Gully, Pharm.D., BCPS AQ-ID []  Garrel Crews, Pharm.D., BCPS []  Almarie Lunger, Pharm.D., BCPS []  [place], [address].D., BCPS, AAHIVP []  Rosaline Bihari, Pharm.D., BCPS, AAHIVP []  Vernell Meier, PharmD, BCPS []  Latanya Hint, PharmD, BCPS []  Donald Medley, PharmD, BCPS []  Rocky Bold, PharmD []  Dorothyann Alert, PharmD, BCPS []  Morene Babe, PharmD  [facility] []  Rosaline Edison, PharmD []  Romona Bliss, PharmD []  Dolphus Roller, PharmD []  Veva Seip, Rph []  Vernell Daunt) Leonce, PharmD []  Eva Allis, PharmD []  Rosaline Millet, PharmD []  Iantha Batch, PharmD []  Arvin Gauss, PharmD []  Wanda Hasting, PharmD []  Ronal Rav, PharmD []  Rocky Slade, PharmD [x]  Camelia Marina, PharmD   Positive urine culture Treated with Ciprofloxacin , organism sensitive to the same and no further patient follow-up is required at this time.  Jeanette [name] 04/18/2024, 8:47 AM

## 2024-05-28 ENCOUNTER — Ambulatory Visit (HOSPITAL_COMMUNITY)
Admission: RE | Admit: 2024-05-28 | Discharge: 2024-05-28 | Disposition: A | Discharge status: Home / Self Care | Source: Ambulatory Visit | Attending: Physician Assistant | Admitting: Physician Assistant

## 2024-05-28 VITALS — BP 130/60 | HR 92 | Ht 62.0 in | Wt 122.6 lb

## 2024-05-28 DIAGNOSIS — I480 Paroxysmal atrial fibrillation: Secondary | ICD-10-CM | POA: Diagnosis not present

## 2024-05-28 DIAGNOSIS — D6869 Other thrombophilia: Secondary | ICD-10-CM | POA: Diagnosis not present

## 2024-05-28 DIAGNOSIS — I4891 Unspecified atrial fibrillation: Secondary | ICD-10-CM | POA: Diagnosis not present

## 2024-05-28 DIAGNOSIS — Z79899 Other long term (current) drug therapy: Secondary | ICD-10-CM

## 2024-05-28 NOTE — Progress Notes (Addendum)
 Primary Care Physician: Shayne Anes, MD Primary Cardiologist: Newman JINNY Lawrence, MD Electrophysiologist: Will Gladis Norton, MD  Referring Physician: Dr. Delford Alfonse Jeanette [name] is a 89 y.o. female with a history of HTN, HLD, aortic and coronary atherosclerosis by CT imaging, chronic diastolic CHF, history of CVA, and atrial fibrillation/flutter who presents for follow up in the [facility]. Hospital admission 4/29-30/2025 for repeat Afib with RVR discharged on flecainide  50 mg BID and Toprol  25 mg daily. Recent hospital admission 4/5-01/2024 for same. Patient with history of nausea with Multaq , decline to take amiodarone  due to side effects, and deferred PPM due to recurrent UTIs. She has history of long post conversion termination pauses up to 6.5 seconds. Patient is on Eliquis  2.5 mg BID for stroke prevention.  Her flecainide  and BB were discontinued due to 2:1 AV block. She was switched to propafenone . Metoprolol  was resumed during an admission for sepsis for tachycardia.   Patient returns for follow up for atrial fibrillation and propafenone  monitoring. Patient reports that she has felt well from a cardiac standpoint. She remains in SR today. No bleeding issues on anticoagulation.   Today, she  denies symptoms of palpitations, chest pain, shortness of breath, orthopnea, PND, lower extremity edema, dizziness, presyncope, syncope, snoring, daytime somnolence, bleeding, or neurologic sequela. The patient is tolerating medications without difficulties and is otherwise without complaint today.    she has a BMI of Body mass index is 22.42 kg/m.SABRA Filed Weights   05/28/24 1132  Weight: 55.6 kg    Current Outpatient Medications  Medication Sig Dispense Refill   acetaminophen  (TYLENOL ) 500 MG tablet Take 500 mg by mouth in the morning and at bedtime.     apixaban  (ELIQUIS ) 2.5 MG TABS tablet Take 1 tablet (2.5 mg total) by mouth 2 (two) times daily. 180  tablet 3   cholecalciferol  (VITAMIN D3) 25 MCG (1000 UNIT) tablet Take 1,000 Units by mouth daily.     Cyanocobalamin  (VITAMIN B12) 1000 MCG TBCR Take 1,000 mcg by mouth in the morning.     escitalopram  (LEXAPRO ) 10 MG tablet Take 10 mg by mouth daily.     metoprolol  succinate (TOPROL -XL) 25 MG 24 hr tablet Take 1 tablet (25 mg total) by mouth daily. 30 tablet 0   nitrofurantoin, macrocrystal-monohydrate, (MACROBID) 100 MG capsule Take 100 mg by mouth 2 (two) times daily.     nitroGLYCERIN  (NITROSTAT ) 0.4 MG SL tablet PLACE 1 TABLET (0.4 MG TOTAL) UNDER THE TONGUE EVERY 5 (FIVE) MINUTES AS NEEDED FOR CHEST PAIN. 75 tablet 1   propafenone  (RYTHMOL  SR) 225 MG 12 hr capsule Take 1 capsule (225 mg total) by mouth 2 (two) times daily. 60 capsule 3   REPATHA SURECLICK 140 MG/ML SOAJ Inject 140 mg into the skin See admin instructions. Take 140mg  SQ every two weeks per patient (Patient not taking: Reported on 05/28/2024)     No current facility-administered medications for this encounter.    Atrial Fibrillation Management history:  Previous antiarrhythmic drugs: Multaq , flecainide , propafenone   Previous cardioversions: 04/2022 Previous ablations: none Anticoagulation history: Eliquis  2.5 mg BID   ROS- All systems are reviewed and negative except as per the HPI above.  Physical Exam: BP 130/60   Pulse 92   Ht 5' 2 (1.575 m)   Wt 55.6 kg   BMI 22.42 kg/m    GEN: Well nourished, well developed in no acute distress CARDIAC: Regular rate and rhythm, no murmurs, rubs, gallops RESPIRATORY:  Clear  to auscultation without rales, wheezing or rhonchi  ABDOMEN: Soft, non-tender, non-distended EXTREMITIES:  No edema; No deformity    EKG Interpretation Date/Time:  Thursday May 28 2024 11:41:15 EST Ventricular Rate:  92 PR Interval:  134 QRS Duration:  78 QT Interval:  360 QTC Calculation: 445 R Axis:   67  Text Interpretation: Sinus rhythm Premature atrial complexes Nonspecific T wave  abnormality Abnormal ECG When compared with ECG of 14-Apr-2024 09:45, Sinus rhythm has replaced Atrial tachycardia Confirmed by Jeanette [name] ([id number]) on 05/28/2024 1:14:16 PM   Echo 10/14/23 demonstrated  1. Left ventricular ejection fraction, by estimation, is 60 to 65%. The  left ventricle has normal function. The left ventricle has no regional  wall motion abnormalities. Left ventricular diastolic parameters were  grossly normal for age (medial e' 7.7  cm/s).   2. Right ventricular systolic function is normal. The right ventricular  size is normal. Tricuspid regurgitation signal is inadequate for assessing  PA pressure.   3. The mitral valve is grossly normal. Trivial mitral valve  regurgitation. No evidence of mitral stenosis.   4. The aortic valve is tricuspid. There is mild calcification of the  aortic valve. Aortic valve regurgitation is not visualized. No aortic  stenosis is present.   5. The inferior vena cava is normal in size with greater than 50%  respiratory variability, suggesting right atrial pressure of 3 mmHg.    CHA2DS2-VASc Score = 7  The patient's score is based upon: CHF History: 0 HTN History: 1 Diabetes History: 0 Stroke History: 2 Vascular Disease History: 1 Age Score: 2 Gender Score: 1       ASSESSMENT AND PLAN: Paroxysmal Atrial Fibrillation/atrial flutter (ICD10:  I48.0) The patient's CHA2DS2-VASc score is 7, indicating a 11.2% annual risk of stroke.   Patient appears to be maintaining SR Continue Eliquis  2.5 mg BID Continue propafenone  225 mg BID Continue Toprol  25 mg daily. We discussed stopping this again given her h/o bradycardia and AV block. Her and her husband would like to continue for now since she is feeling well.   Secondary Hypercoagulable State (ICD10:  D68.69) The patient is at significant risk for stroke/thromboembolism based upon her CHA2DS2-VASc Score of 7.  Continue Apixaban  (Eliquis ). No bleeding issues.   High Risk Medication  Monitoring (ICD 10: J342684) Patient requires ongoing monitoring for anti-arrhythmic medication which has the potential to cause life threatening arrhythmias. Intervals on ECG acceptable for propafenone  monitoring.     Mobitz I AV block Currently tolerating low dose BB. If PPM is needed, could consider leadless PPM. Followed by Dr Inocencio   Follow up with Dr Inocencio or EP APP in 3 months.    Daril Kicks PA-C [facility] [address] [place], [place] [address] [phone]

## 2024-05-28 NOTE — Patient Instructions (Signed)
 Follow up with Dr Inocencio in 3 months - office will contact you

## 2024-06-21 ENCOUNTER — Other Ambulatory Visit: Payer: Self-pay | Admitting: Cardiology

## 2024-07-23 ENCOUNTER — Emergency Department (HOSPITAL_COMMUNITY)

## 2024-07-23 ENCOUNTER — Other Ambulatory Visit: Payer: Self-pay

## 2024-07-23 ENCOUNTER — Emergency Department (HOSPITAL_COMMUNITY)
Admission: EM | Admit: 2024-07-23 | Discharge: 2024-07-23 | Disposition: A | Discharge status: Home / Self Care | Attending: Emergency Medicine | Admitting: Emergency Medicine

## 2024-07-23 ENCOUNTER — Encounter (HOSPITAL_COMMUNITY): Payer: Self-pay

## 2024-07-23 DIAGNOSIS — R002 Palpitations: Secondary | ICD-10-CM

## 2024-07-23 DIAGNOSIS — R42 Dizziness and giddiness: Secondary | ICD-10-CM | POA: Insufficient documentation

## 2024-07-23 DIAGNOSIS — I4891 Unspecified atrial fibrillation: Secondary | ICD-10-CM | POA: Diagnosis not present

## 2024-07-23 DIAGNOSIS — R5383 Other fatigue: Secondary | ICD-10-CM | POA: Diagnosis not present

## 2024-07-23 DIAGNOSIS — R110 Nausea: Secondary | ICD-10-CM | POA: Diagnosis not present

## 2024-07-23 DIAGNOSIS — Z7901 Long term (current) use of anticoagulants: Secondary | ICD-10-CM | POA: Insufficient documentation

## 2024-07-23 LAB — COMPREHENSIVE METABOLIC PANEL WITH GFR
ALT: 15 U/L (ref 0–44)
AST: 21 U/L (ref 15–41)
Albumin: 4 g/dL (ref 3.5–5.0)
Alkaline Phosphatase: 64 U/L (ref 38–126)
Anion gap: 8 (ref 5–15)
BUN: 14 mg/dL (ref 8–23)
CO2: 28 mmol/L (ref 22–32)
Calcium: 10.7 mg/dL — ABNORMAL HIGH (ref 8.9–10.3)
Chloride: 103 mmol/L (ref 98–111)
Creatinine, Ser: 0.91 mg/dL (ref 0.44–1.00)
GFR, Estimated: >60 mL/min
Glucose, Bld: 79 mg/dL (ref 70–99)
Potassium: 3.8 mmol/L (ref 3.5–5.1)
Sodium: 139 mmol/L (ref 135–145)
Total Bilirubin: 0.4 mg/dL (ref 0.0–1.2)
Total Protein: 6.6 g/dL (ref 6.5–8.1)

## 2024-07-23 LAB — CBC WITH DIFFERENTIAL/PLATELET
Abs Immature Granulocytes: 0.04 K/uL (ref 0.00–0.07)
Basophils Absolute: 0 K/uL (ref 0.0–0.1)
Basophils Relative: 1 %
Eosinophils Absolute: 0.1 K/uL (ref 0.0–0.5)
Eosinophils Relative: 2 %
HCT: 35.9 % — ABNORMAL LOW (ref 36.0–46.0)
Hemoglobin: 11.3 g/dL — ABNORMAL LOW (ref 12.0–15.0)
Immature Granulocytes: 1 %
Lymphocytes Relative: 19 %
Lymphs Abs: 1.1 K/uL (ref 0.7–4.0)
MCH: 31 pg (ref 26.0–34.0)
MCHC: 31.5 g/dL (ref 30.0–36.0)
MCV: 98.4 fL (ref 80.0–100.0)
Monocytes Absolute: 0.8 K/uL (ref 0.1–1.0)
Monocytes Relative: 14 %
Neutro Abs: 3.7 K/uL (ref 1.7–7.7)
Neutrophils Relative %: 63 %
Platelets: 206 K/uL (ref 150–400)
RBC: 3.65 MIL/uL — ABNORMAL LOW (ref 3.87–5.11)
RDW: 14.2 % (ref 11.5–15.5)
WBC: 5.8 K/uL (ref 4.0–10.5)
nRBC: 0 % (ref 0.0–0.2)

## 2024-07-23 LAB — TROPONIN T, HIGH SENSITIVITY: Troponin T High Sensitivity: <15 ng/L (ref 0–19)

## 2024-07-23 LAB — LIPASE, BLOOD: Lipase: 27 U/L (ref 11–51)

## 2024-07-23 NOTE — ED Provider Notes (Addendum)
 " [place] EMERGENCY DEPARTMENT AT [facility] Provider Note   CSN: [id number] Arrival date & time: 07/23/24  1059     Patient presents with: No chief complaint on file.   Jeanette [name] is a 89 y.o. female.   89 year old female who presents from her urologist office with concerns for A-fib.  Patient saw urologist and was diagnosed with UTI.  Patient does have history of A-fib and is on Eliquis  and has been compliant with her medications.  According to her, heart rate was noted to be in the 140s.  Patient did feel slightly weak today.  She was somewhat lightheaded and some nausea and fatigue.  She has not had any fevers.  No flank or abdominal discomfort.  No vomiting or diarrhea.  States that symptoms resolved spontaneously and she feels much better at her baseline.       Prior to Admission medications  Medication Sig Start Date End Date Taking? Authorizing Provider  acetaminophen  (TYLENOL ) 500 MG tablet Take 500 mg by mouth in the morning and at bedtime.    [provider]  apixaban  (ELIQUIS ) 2.5 MG TABS tablet Take 1 tablet (2.5 mg total) by mouth 2 (two) times daily. 11/06/23   Duke, Jon Garre, PA  cholecalciferol  (VITAMIN D3) 25 MCG (1000 UNIT) tablet Take 1,000 Units by mouth daily.    [provider]  Cyanocobalamin  (VITAMIN B12) 1000 MCG TBCR Take 1,000 mcg by mouth in the morning.    [provider]  escitalopram  (LEXAPRO ) 10 MG tablet Take 10 mg by mouth daily.    [provider]  metoprolol  succinate (TOPROL -XL) 25 MG 24 hr tablet Take 1 tablet (25 mg total) by mouth daily. 04/17/24   Adhikari, Amrit, MD  nitrofurantoin, macrocrystal-monohydrate, (MACROBID) 100 MG capsule Take 100 mg by mouth 2 (two) times daily. 05/26/24   [provider]  nitroGLYCERIN  (NITROSTAT ) 0.4 MG SL tablet PLACE 1 TABLET (0.4 MG TOTAL) UNDER THE TONGUE EVERY 5 (FIVE) MINUTES AS NEEDED FOR CHEST PAIN. 07/13/19 05/28/24  Patwardhan, Newman PARAS, MD   propafenone  (RYTHMOL  SR) 225 MG 12 hr capsule TAKE 1 CAPSULE BY MOUTH 2 TIMES DAILY. 06/23/24   Camnitz, Will Gladis, MD  REPATHA SURECLICK 140 MG/ML SOAJ Inject 140 mg into the skin See admin instructions. Take 140mg  SQ every two weeks per patient Patient not taking: Reported on 05/28/2024 10/12/19   [provider]    Allergies: Epinephrine , Benazepril hcl, Cozaar [losartan], Crestor [rosuvastatin calcium ], Galantamine hydrobromide, Iron, Lisinopril, and Septra [sulfamethoxazole-trimethoprim]    Review of Systems  All other systems reviewed and are negative.   Updated Vital Signs BP (!) 141/71 (BP Location: Right Arm)   Pulse 82   Temp 97.8 F (36.6 C)   Resp 16   Ht 1.626 m (5' 4)   Wt 54.4 kg   SpO2 98%   BMI 20.60 kg/m   Physical Exam Vitals and nursing note reviewed.  Constitutional:      General: She is not in acute distress.    Appearance: Normal appearance. She is well-developed. She is not toxic-appearing.  HENT:     Head: Normocephalic and atraumatic.  Eyes:     General: Lids are normal.     Conjunctiva/sclera: Conjunctivae normal.     Pupils: Pupils are equal, round, and reactive to light.  Neck:     Thyroid : No thyroid  mass.     Trachea: No tracheal deviation.  Cardiovascular:     Rate and Rhythm: Normal rate  and regular rhythm.     Heart sounds: Normal heart sounds. No murmur heard.    No gallop.  Pulmonary:     Effort: Pulmonary effort is normal. No respiratory distress.     Breath sounds: Normal breath sounds. No stridor. No decreased breath sounds, wheezing, rhonchi or rales.  Abdominal:     General: There is no distension.     Palpations: Abdomen is soft.     Tenderness: There is no abdominal tenderness. There is no rebound.  Musculoskeletal:        General: No tenderness. Normal range of motion.     Cervical back: Normal range of motion and neck supple.  Skin:    General: Skin is warm and dry.     Findings: No abrasion or rash.   Neurological:     Mental Status: She is alert and oriented to person, place, and time. Mental status is at baseline.     GCS: GCS eye subscore is 4. GCS verbal subscore is 5. GCS motor subscore is 6.     Cranial Nerves: No cranial nerve deficit.     Sensory: No sensory deficit.     Motor: Motor function is intact.  Psychiatric:        Attention and Perception: Attention normal.        Speech: Speech normal.        Behavior: Behavior normal.     (all labs ordered are listed, but only abnormal results are displayed) Labs Reviewed  COMPREHENSIVE METABOLIC PANEL WITH GFR - Abnormal; Notable for the following components:      Result Value   Calcium  10.7 (*)    All other components within normal limits  CBC WITH DIFFERENTIAL/PLATELET - Abnormal; Notable for the following components:   RBC 3.65 (*)    Hemoglobin 11.3 (*)    HCT 35.9 (*)    All other components within normal limits  LIPASE, BLOOD  TROPONIN T, HIGH SENSITIVITY  TROPONIN T, HIGH SENSITIVITY    EKG: EKG Interpretation Date/Time:  Thursday July 23 2024 11:09:30 EST Ventricular Rate:  66 PR Interval:  216 QRS Duration:  74 QT Interval:  364 QTC Calculation: 381 R Axis:   77  Text Interpretation: Sinus rhythm with marked sinus arrhythmia with 1st degree A-V block Nonspecific T wave abnormality Abnormal ECG When compared with ECG of 28-May-2024 11:41, PREVIOUS ECG IS PRESENT Confirmed by Dasie Faden ([id number]) on 07/23/2024 1:27:52 PM  Radiology: [facility] Chest 1 View Result Date: 07/23/2024 CLINICAL DATA:  Calcification. EXAM: CHEST  1 VIEW COMPARISON:  Chest radiograph dated 04/13/2024. FINDINGS: No focal consolidation, pleural effusion, or pneumothorax. Stable cardiac silhouette. Atherosclerotic calcification of the aorta. No acute osseous pathology. IMPRESSION: No active disease. Electronically Signed   By: Vanetta Chou M.D.   On: 07/23/2024 13:08     Procedures   Medications Ordered in the ED - No data to  display                                  Medical Decision Making  Patient's EKG shows sinus rhythm.  Chest x-ray without acute findings per my interpretation.  I did have Dr. Delford from cardiology also reviewed the study as she does have some evidence of block.  Patient has no symptoms at this time. Patient had blood work here which was reassuring which included a negative troponin.  She did not have any cardiac symptomatology with  this in terms of chest pain or shortness of breath.  Her repeat EKG continues to show sinus rhythm.  Some of this may be related to underlying UTI.  She has been prescribed antibiotics.  Plan will be to discharge home with cardiology follow-up    Final diagnoses:  None    ED Discharge Orders     None          Dasie Faden, MD 07/23/24 1353    Dasie Faden, MD 07/23/24 1405  "

## 2024-07-23 NOTE — ED Notes (Signed)
 Patient refusing cardiac monitoring despite this RN explaining to patient importance of cardiac monitoring. EDP at bedside.

## 2024-07-23 NOTE — ED Notes (Signed)
 ED Provider at bedside.

## 2024-07-23 NOTE — ED Notes (Addendum)
 ED Provider at bedside. Questions by patient clarified with EDP.

## 2024-07-23 NOTE — ED Triage Notes (Signed)
 Hx of afib. C/O palpations,dizziness.nausea that started this morning. Denies CVP and SHOB. Axox4.

## 2024-07-23 NOTE — ED Provider Triage Note (Addendum)
 Emergency Medicine Provider Triage Evaluation Note  Jeanette [name] , [name] 89 y.o. female  was evaluated in triage.  Pt complains of palpitations  Patient reports that she went to see [name] kidney doctor this morning, was told that she was in rapid [name]-fib and was told to go to the ER.  Patient reports she was feeling lightheaded, nauseated, had fatigue.  Symptoms have since improved. Checked her HR in lobby and had improved. Compliant w/ typical medicatiosn   Review of Systems  Positive: nausea Negative: cp  Physical Exam  BP (!) 141/71 (BP Location: Right Arm)   Pulse 82   Temp 97.8 F (36.6 C)   Resp 16   Ht 5' 4 (1.626 m)   Wt 54.4 kg   SpO2 98%   BMI 20.60 kg/m  Gen:   Awake, no distress   Resp:  Normal effort  MSK:   Moves extremities without difficulty  Other:  aox3  Medical Decision Making  Medically screening exam initiated at 12:20 PM.  Appropriate orders placed.  Jeanette [name] was informed that the remainder of the evaluation will be completed by another provider, this initial triage assessment does not replace that evaluation, and the importance of remaining in the ED until their evaluation is complete.     Jeanette Jayson LABOR, DO 07/23/24 1220    Jeanette Jayson A, DO 07/23/24 1329

## 2024-07-23 NOTE — ED Notes (Signed)
 Patient would like to speak with EDP prior to discharge. Discharge pending at this time. Discharge instructions, follow up care reviewed with pt. Pt verbalized understanding.

## 2024-09-10 ENCOUNTER — Ambulatory Visit: Admitting: Cardiology
